# Patient Record
Sex: Female | Born: 1985 | Race: White | Hispanic: No | Marital: Married | Smoking: Current every day smoker
Health system: Southern US, Community
[De-identification: ages and names within clinical notes are randomized; demographics above are authoritative.]

## PROBLEM LIST (undated history)

## (undated) DIAGNOSIS — T783XXA Angioneurotic edema, initial encounter: Secondary | ICD-10-CM

## (undated) DIAGNOSIS — N926 Irregular menstruation, unspecified: Secondary | ICD-10-CM

## (undated) DIAGNOSIS — N898 Other specified noninflammatory disorders of vagina: Secondary | ICD-10-CM

## (undated) DIAGNOSIS — N644 Mastodynia: Secondary | ICD-10-CM

## (undated) DIAGNOSIS — Z7689 Persons encountering health services in other specified circumstances: Secondary | ICD-10-CM

## (undated) DIAGNOSIS — R569 Unspecified convulsions: Secondary | ICD-10-CM

## (undated) DIAGNOSIS — N390 Urinary tract infection, site not specified: Secondary | ICD-10-CM

## (undated) DIAGNOSIS — Z87898 Personal history of other specified conditions: Secondary | ICD-10-CM

## (undated) DIAGNOSIS — L509 Urticaria, unspecified: Secondary | ICD-10-CM

## (undated) DIAGNOSIS — Z113 Encounter for screening for infections with a predominantly sexual mode of transmission: Secondary | ICD-10-CM

## (undated) DIAGNOSIS — N912 Amenorrhea, unspecified: Secondary | ICD-10-CM

## (undated) DIAGNOSIS — N921 Excessive and frequent menstruation with irregular cycle: Secondary | ICD-10-CM

## (undated) HISTORY — PX: APPENDECTOMY: SHX54

## (undated) HISTORY — DX: Urticaria, unspecified: L50.9

## (undated) HISTORY — DX: Angioneurotic edema, initial encounter: T78.3XXA

## (undated) HISTORY — DX: Other specified noninflammatory disorders of vagina: N89.8

## (undated) HISTORY — DX: Mastodynia: N64.4

## (undated) HISTORY — PX: CHOLECYSTECTOMY: SHX55

## (undated) HISTORY — DX: Unspecified convulsions: R56.9

## (undated) HISTORY — PX: TUBAL LIGATION: SHX77

## (undated) HISTORY — DX: Encounter for screening for infections with a predominantly sexual mode of transmission: Z11.3

## (undated) HISTORY — DX: Persons encountering health services in other specified circumstances: Z76.89

## (undated) HISTORY — DX: Urinary tract infection, site not specified: N39.0

## (undated) HISTORY — DX: Amenorrhea, unspecified: N91.2

## (undated) HISTORY — DX: Irregular menstruation, unspecified: N92.6

## (undated) HISTORY — DX: Excessive and frequent menstruation with irregular cycle: N92.1

---
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
## 2007-04-14 ENCOUNTER — Emergency Department (HOSPITAL_COMMUNITY): Admission: EM | Admit: 2007-04-14 | Discharge: 2007-04-14 | Payer: Self-pay | Admitting: Emergency Medicine

## 2007-07-17 ENCOUNTER — Emergency Department (HOSPITAL_COMMUNITY): Admission: EM | Admit: 2007-07-17 | Discharge: 2007-07-17 | Payer: Self-pay | Admitting: Emergency Medicine

## 2007-11-28 ENCOUNTER — Emergency Department (HOSPITAL_COMMUNITY): Admission: EM | Admit: 2007-11-28 | Discharge: 2007-11-28 | Payer: Self-pay | Admitting: Emergency Medicine

## 2007-12-10 ENCOUNTER — Ambulatory Visit (HOSPITAL_COMMUNITY): Admission: RE | Admit: 2007-12-10 | Discharge: 2007-12-10 | Payer: Self-pay | Admitting: Obstetrics and Gynecology

## 2008-01-28 ENCOUNTER — Inpatient Hospital Stay (HOSPITAL_COMMUNITY): Admission: AD | Admit: 2008-01-28 | Discharge: 2008-01-29 | Payer: Self-pay | Admitting: Obstetrics and Gynecology

## 2008-01-29 ENCOUNTER — Inpatient Hospital Stay (HOSPITAL_COMMUNITY): Admission: AD | Admit: 2008-01-29 | Discharge: 2008-01-29 | Payer: Self-pay | Admitting: Obstetrics & Gynecology

## 2008-01-29 ENCOUNTER — Ambulatory Visit: Payer: Self-pay | Admitting: Advanced Practice Midwife

## 2008-01-31 ENCOUNTER — Observation Stay (HOSPITAL_COMMUNITY): Admission: AD | Admit: 2008-01-31 | Discharge: 2008-02-01 | Payer: Self-pay | Admitting: Obstetrics & Gynecology

## 2008-01-31 ENCOUNTER — Ambulatory Visit: Payer: Self-pay | Admitting: *Deleted

## 2008-02-04 ENCOUNTER — Inpatient Hospital Stay (HOSPITAL_COMMUNITY): Admission: AD | Admit: 2008-02-04 | Discharge: 2008-02-04 | Payer: Self-pay | Admitting: Gynecology

## 2008-02-19 ENCOUNTER — Inpatient Hospital Stay (HOSPITAL_COMMUNITY): Admission: AD | Admit: 2008-02-19 | Discharge: 2008-02-19 | Payer: Self-pay | Admitting: Family Medicine

## 2008-02-19 ENCOUNTER — Ambulatory Visit: Payer: Self-pay | Admitting: Family Medicine

## 2008-03-04 ENCOUNTER — Inpatient Hospital Stay (HOSPITAL_COMMUNITY): Admission: AD | Admit: 2008-03-04 | Discharge: 2008-03-04 | Payer: Self-pay | Admitting: Gynecology

## 2008-03-08 ENCOUNTER — Ambulatory Visit: Payer: Self-pay | Admitting: Obstetrics and Gynecology

## 2008-03-08 ENCOUNTER — Inpatient Hospital Stay (HOSPITAL_COMMUNITY): Admission: AD | Admit: 2008-03-08 | Discharge: 2008-03-10 | Payer: Self-pay | Admitting: Family Medicine

## 2009-12-31 ENCOUNTER — Emergency Department (HOSPITAL_COMMUNITY): Admission: EM | Admit: 2009-12-31 | Discharge: 2010-01-01 | Payer: Self-pay | Admitting: Emergency Medicine

## 2010-02-06 ENCOUNTER — Other Ambulatory Visit: Admission: RE | Admit: 2010-02-06 | Discharge: 2010-02-06 | Payer: Self-pay | Admitting: Obstetrics and Gynecology

## 2010-06-09 ENCOUNTER — Inpatient Hospital Stay (HOSPITAL_COMMUNITY)
Admission: AD | Admit: 2010-06-09 | Discharge: 2010-06-09 | Payer: Self-pay | Source: Home / Self Care | Admitting: Obstetrics & Gynecology

## 2010-06-12 ENCOUNTER — Inpatient Hospital Stay (HOSPITAL_COMMUNITY)
Admission: AD | Admit: 2010-06-12 | Discharge: 2010-06-12 | Payer: Self-pay | Source: Home / Self Care | Admitting: Obstetrics and Gynecology

## 2010-06-29 ENCOUNTER — Inpatient Hospital Stay (HOSPITAL_COMMUNITY)
Admission: AD | Admit: 2010-06-29 | Discharge: 2010-06-29 | Payer: Self-pay | Source: Home / Self Care | Admitting: Obstetrics and Gynecology

## 2010-07-01 ENCOUNTER — Inpatient Hospital Stay (HOSPITAL_COMMUNITY)
Admission: AD | Admit: 2010-07-01 | Discharge: 2010-07-02 | Payer: Self-pay | Source: Home / Self Care | Attending: Obstetrics & Gynecology | Admitting: Obstetrics & Gynecology

## 2010-08-04 ENCOUNTER — Inpatient Hospital Stay (HOSPITAL_COMMUNITY)
Admission: AD | Admit: 2010-08-04 | Discharge: 2010-08-04 | Payer: Self-pay | Source: Home / Self Care | Attending: Obstetrics & Gynecology | Admitting: Obstetrics & Gynecology

## 2010-08-07 LAB — URINE MICROSCOPIC-ADD ON

## 2010-08-07 LAB — URINALYSIS, ROUTINE W REFLEX MICROSCOPIC
Bilirubin Urine: NEGATIVE
Hgb urine dipstick: NEGATIVE
Ketones, ur: NEGATIVE mg/dL
Nitrite: NEGATIVE
Protein, ur: NEGATIVE mg/dL
Specific Gravity, Urine: 1.01 (ref 1.005–1.030)
Urine Glucose, Fasting: NEGATIVE mg/dL
Urobilinogen, UA: 0.2 mg/dL (ref 0.0–1.0)
pH: 6 (ref 5.0–8.0)

## 2010-08-11 ENCOUNTER — Inpatient Hospital Stay (HOSPITAL_COMMUNITY)
Admission: AD | Admit: 2010-08-11 | Discharge: 2010-08-15 | Payer: Self-pay | Source: Home / Self Care | Attending: Obstetrics & Gynecology | Admitting: Obstetrics & Gynecology

## 2010-08-14 LAB — WET PREP, GENITAL
Clue Cells Wet Prep HPF POC: NONE SEEN
Trich, Wet Prep: NONE SEEN
Yeast Wet Prep HPF POC: NONE SEEN

## 2010-08-14 LAB — CBC
HCT: 32.7 % — ABNORMAL LOW (ref 36.0–46.0)
Hemoglobin: 10.6 g/dL — ABNORMAL LOW (ref 12.0–15.0)
MCH: 27.9 pg (ref 26.0–34.0)
MCHC: 32.4 g/dL (ref 30.0–36.0)
MCV: 86.1 fL (ref 78.0–100.0)
Platelets: 246 10*3/uL (ref 150–400)
RBC: 3.8 MIL/uL — ABNORMAL LOW (ref 3.87–5.11)
RDW: 14.4 % (ref 11.5–15.5)
WBC: 16.2 10*3/uL — ABNORMAL HIGH (ref 4.0–10.5)

## 2010-08-14 LAB — URINE MICROSCOPIC-ADD ON

## 2010-08-14 LAB — URINALYSIS, ROUTINE W REFLEX MICROSCOPIC
Bilirubin Urine: NEGATIVE
Hgb urine dipstick: NEGATIVE
Ketones, ur: NEGATIVE mg/dL
Nitrite: NEGATIVE
Protein, ur: NEGATIVE mg/dL
Specific Gravity, Urine: 1.01 (ref 1.005–1.030)
Urine Glucose, Fasting: NEGATIVE mg/dL
Urobilinogen, UA: 0.2 mg/dL (ref 0.0–1.0)
pH: 6.5 (ref 5.0–8.0)

## 2010-08-15 LAB — RPR: RPR Ser Ql: NONREACTIVE

## 2010-08-15 LAB — GC/CHLAMYDIA PROBE AMP, GENITAL
Chlamydia, DNA Probe: NEGATIVE
GC Probe Amp, Genital: NEGATIVE

## 2010-08-15 NOTE — Op Note (Addendum)
  NAME:  Jaime Flores, Jaime Flores NO.:  [id number]  MEDICAL RECORD NO.:  [id number]          PATIENT TYPE:  INP  LOCATION:  9107                          FACILITY:  [facility]  PHYSICIAN:  Scheryl Darter, MD       DATE OF BIRTH:  04/05/1986  DATE OF PROCEDURE:  08/14/2010 DATE OF DISCHARGE:                              OPERATIVE REPORT   PREOPERATIVE DIAGNOSIS:  A 25 year old gravida 2, now para 2 with undesired fertility.  POSTOPERATIVE DIAGNOSIS:  A 25 year old gravida 2, now para 2 with undesired fertility.  PROCEDURE:  Postpartum tubal ligation with Filshie clips.  SURGEON:  Scheryl Darter, MD  ASSISTANT:  Lucina Mellow, DO  ANESTHESIA:  Spinal.  COMPLICATIONS:  None.  ESTIMATED BLOOD LOSS:  Less than 20 mL.  INDICATION:  This is a 25 year old female gravida 2, para 1-1-0-2, status post NSVD on August 13, 2010, who desired permanent sterilization.  Risks and benefits of the procedure were discussed with the patient including risk of failure less than 1 in 1000 with increased risk of ectopic gestation if pregnancy were to occur.  FINDINGS:  Include normal tubes and fimbria.  DESCRIPTION OF PROCEDURE:  The patient was taken to the operating room where a spinal anesthesia was found to be adequate.  A 10 mL of Marcaine was injected along the infraumbilical curvature.  The scar line from previous operation was followed with an infraumbilical skin incision made with scalpel.  The incision was carried down through the underlying fascia to the peritoneum with Mayo scissors and the peritoneum was entered.  The peritoneum was noted to be free of any adhesions and the incision was extended with Metzenbaum scissors.  The patient's left fallopian tube was identified, brought to the incision and grasped with Babcock clamps.  The tube was followed out to the fimbria.  The Babcock clamp was then used to grasp the tube approximately 4 cm from the cornual region.  Filshie clip  was then placed with good visualization of the Filshie clip on both sides of the tube.  No bleeding was noted and tube was returned back into the abdomen.  The right fallopian tube was then identified in the same fashion and again a Filshie clip visualized on both sides of the tube.  No bleeding again was noted.  The tube was returned to the abdomen.  The peritoneum and fascia were closed in single layer using 3-0 Vicryl.  The skin was closed in a subcuticular fashion using 3-0 Vicryl.  The patient tolerated the procedure well.  Sponge, lap, and needle counts were correct x2.  The patient was taken to the recovery room in stable condition.  There was no specimens for Pathology.    ______________________________ Lucina Mellow, DO   ______________________________ Scheryl Darter, MD    SH/MEDQ  D:  08/14/2010  T:  08/14/2010  Job:  [id number]  Electronically Signed by Lucina Mellow MD on 08/15/2010 01:53:28 PM Electronically Signed by Scheryl Darter MD on 08/19/2010 11:04:34 AM

## 2010-09-19 ENCOUNTER — Emergency Department (HOSPITAL_COMMUNITY)
Admission: EM | Admit: 2010-09-19 | Discharge: 2010-09-19 | Disposition: A | Discharge status: Home / Self Care | Payer: Medicaid Other | Attending: Emergency Medicine | Admitting: Emergency Medicine

## 2010-09-19 DIAGNOSIS — R109 Unspecified abdominal pain: Secondary | ICD-10-CM | POA: Insufficient documentation

## 2010-09-19 DIAGNOSIS — K8020 Calculus of gallbladder without cholecystitis without obstruction: Secondary | ICD-10-CM | POA: Insufficient documentation

## 2010-09-19 LAB — DIFFERENTIAL
Basophils Absolute: 0 10*3/uL (ref 0.0–0.1)
Basophils Relative: 0 % (ref 0–1)
Eosinophils Absolute: 0.2 10*3/uL (ref 0.0–0.7)
Eosinophils Relative: 2 % (ref 0–5)
Lymphocytes Relative: 20 % (ref 12–46)
Lymphs Abs: 1.8 10*3/uL (ref 0.7–4.0)
Monocytes Absolute: 0.3 10*3/uL (ref 0.1–1.0)
Monocytes Relative: 3 % (ref 3–12)
Neutro Abs: 6.6 10*3/uL (ref 1.7–7.7)
Neutrophils Relative %: 75 % (ref 43–77)

## 2010-09-19 LAB — COMPREHENSIVE METABOLIC PANEL
ALT: 37 U/L — ABNORMAL HIGH (ref 0–35)
AST: 43 U/L — ABNORMAL HIGH (ref 0–37)
Albumin: 3.7 g/dL (ref 3.5–5.2)
Alkaline Phosphatase: 91 U/L (ref 39–117)
BUN: 5 mg/dL — ABNORMAL LOW (ref 6–23)
CO2: 30 mEq/L (ref 19–32)
Calcium: 9 mg/dL (ref 8.4–10.5)
Chloride: 106 mEq/L (ref 96–112)
Creatinine, Ser: 0.75 mg/dL (ref 0.4–1.2)
GFR calc Af Amer: >60 mL/min (ref >60)
GFR calc non Af Amer: >60 mL/min (ref >60)
Glucose, Bld: 106 mg/dL — ABNORMAL HIGH (ref 70–99)
Potassium: 3.8 mEq/L (ref 3.5–5.1)
Sodium: 143 mEq/L (ref 135–145)
Total Bilirubin: 0.6 mg/dL (ref 0.3–1.2)
Total Protein: 6.4 g/dL (ref 6.0–8.3)

## 2010-09-19 LAB — CBC
HCT: 38.1 % (ref 36.0–46.0)
Hemoglobin: 11.8 g/dL — ABNORMAL LOW (ref 12.0–15.0)
MCH: 27.1 pg (ref 26.0–34.0)
MCHC: 31 g/dL (ref 30.0–36.0)
MCV: 87.6 fL (ref 78.0–100.0)
Platelets: 272 10*3/uL (ref 150–400)
RBC: 4.35 MIL/uL (ref 3.87–5.11)
RDW: 15.3 % (ref 11.5–15.5)
WBC: 8.7 10*3/uL (ref 4.0–10.5)

## 2010-09-19 LAB — URINALYSIS, ROUTINE W REFLEX MICROSCOPIC
Protein, ur: NEGATIVE mg/dL
Specific Gravity, Urine: 1.02 (ref 1.005–1.030)
Urine Glucose, Fasting: NEGATIVE mg/dL
Urobilinogen, UA: 1 mg/dL (ref 0.0–1.0)

## 2010-09-19 LAB — URINE MICROSCOPIC-ADD ON

## 2010-09-20 ENCOUNTER — Ambulatory Visit (HOSPITAL_COMMUNITY)
Admit: 2010-09-20 | Discharge: 2010-09-20 | Disposition: A | Discharge status: Home / Self Care | Payer: Medicaid Other | Source: Ambulatory Visit | Attending: Emergency Medicine | Admitting: Emergency Medicine

## 2010-09-20 DIAGNOSIS — R932 Abnormal findings on diagnostic imaging of liver and biliary tract: Secondary | ICD-10-CM | POA: Insufficient documentation

## 2010-09-20 DIAGNOSIS — R1013 Epigastric pain: Secondary | ICD-10-CM | POA: Insufficient documentation

## 2010-09-20 DIAGNOSIS — K8020 Calculus of gallbladder without cholecystitis without obstruction: Secondary | ICD-10-CM | POA: Insufficient documentation

## 2010-09-21 ENCOUNTER — Other Ambulatory Visit: Payer: Self-pay | Admitting: General Surgery

## 2010-09-21 ENCOUNTER — Inpatient Hospital Stay (HOSPITAL_COMMUNITY)
Admission: RE | Admit: 2010-09-21 | Discharge: 2010-09-22 | DRG: 419 | Disposition: A | Discharge status: Home / Self Care | Payer: Medicaid Other | Source: Ambulatory Visit | Attending: General Surgery | Admitting: General Surgery

## 2010-09-21 DIAGNOSIS — K8000 Calculus of gallbladder with acute cholecystitis without obstruction: Principal | ICD-10-CM | POA: Diagnosis present

## 2010-09-21 LAB — DIFFERENTIAL
Basophils Relative: 0 % (ref 0–1)
Eosinophils Absolute: 0.1 10*3/uL (ref 0.0–0.7)
Monocytes Absolute: 0.2 10*3/uL (ref 0.1–1.0)
Monocytes Relative: 3 % (ref 3–12)

## 2010-09-21 LAB — CBC
MCH: 27.1 pg (ref 26.0–34.0)
MCHC: 31.4 g/dL (ref 30.0–36.0)
Platelets: 221 10*3/uL (ref 150–400)
RDW: 15.1 % (ref 11.5–15.5)

## 2010-09-21 LAB — COMPREHENSIVE METABOLIC PANEL
AST: 56 U/L — ABNORMAL HIGH (ref 0–37)
Albumin: 3.4 g/dL — ABNORMAL LOW (ref 3.5–5.2)
Calcium: 9 mg/dL (ref 8.4–10.5)
Creatinine, Ser: 0.68 mg/dL (ref 0.4–1.2)
GFR calc Af Amer: >60 mL/min (ref >60)
GFR calc non Af Amer: >60 mL/min (ref >60)
Total Protein: 6 g/dL (ref 6.0–8.3)

## 2010-09-21 LAB — MRSA PCR SCREENING: MRSA by PCR: NEGATIVE

## 2010-09-29 NOTE — Op Note (Signed)
[name], Jaime Flores             ACCOUNT NO.:  [id number]  MEDICAL RECORD NO.:  [id number]           PATIENT TYPE:  I  LOCATION:  A303                          FACILITY:  [facility]  PHYSICIAN:  Tilford Pillar, MD      DATE OF BIRTH:  1986/03/12  DATE OF PROCEDURE:  09/21/2010 DATE OF DISCHARGE:                              OPERATIVE REPORT   PREOPERATIVE DIAGNOSIS:  Acute cholecystitis.  POSTOPERATIVE DIAGNOSIS:  Acute cholecystitis.  PROCEDURE:  Laparoscopic cholecystectomy.  SURGEON:  Tilford Pillar, MD  ANESTHESIA:  General endotracheal, local anesthetic 0.5% Sensorcaine plain.  SPECIMEN:  Gallbladder.  ESTIMATED BLOOD LOSS:  Minimal.  INDICATIONS:  The patient is a 25 year old female who presented to [facility] with worsening right upper quadrant abdominal pain.  She had intermittent episodes over the last several weeks and workup was consistent for cholelithiasis and acute cholecystitis.  The risks, benefits, alternatives of laparoscopic possible open cholecystectomy were discussed at length with the patient including but not limited to risk of bleeding, infection, bile leak, small bowel injury, common bile duct injury as well as possibility of entrapped cardiac and pulmonary events were discussed at length with the patient.  Questions and concerns were addressed.  The patient was consented for planned procedure.  OPERATION:  The patient was taken to the operating room, placed in supine position on the operating table at which time the general anesthetic was administered.  Once the patient was asleep, she endotracheally intubated by Anesthesia.  At this time, her abdomen was prepped DuraPrep solution and draped in standard fashion.  A stab incision was created supraumbilically with #11 blade scalpel. Additional dissection down to the subcuticular tissues carried out using a Kocher clamp, which was utilized to grasp the anterior abdominal wall fascia  anteriorly.  A Veress needle was inserted.  Saline drop test was utilized to confirm intraperitoneal placement and then pneumoperitoneum was initiated.  Once sufficient pneumoperitoneum was obtained, a 11-mm trocar was inserted over laparoscope allowing visualization of the trocar entering into the peritoneal cavity.  At this time the inner cannula was removed.  Laparoscope was reinserted.  There was no evidence of any trocar or Veress needle placement injury.  At this time, the remaining trocars were placed with 11-mm trocar in the epigastrium, 5-mm trocar in the midline between two 11-mm trocar, 5-mm trocar in the right lateral abdominal wall.  The patient was placed in reversed Trendelenburg left lateral decubitus position.  Fundus of the gallbladder was grasped and lifted up and over the right lobe of the liver.  At this time blunt [place] dissection was utilized to dissect the peritoneal reflection off the infundibulum exposing the cystic duct __________ infundibulum.  A window was created behind the cystic duct as well as on the cystic artery.  Three EndoClips were placed proximally on the cystic duct, one distally and cystic duct was divided between two most distal clips.  Similarly the cystic artery was clipped with 2 EndoClips proximally, one distally, and the cystic artery was divided between two most distal clips.  At this time, electrocautery was utilized to dissect the gallbladder free from the gallbladder  fossa. Once it was freed, it was placed into an EndoCatch bag and placed up and over the right lobe of the liver.  At this time, hemostasis was excellent within the gallbladder fossa.  Inspection of the EndoClips demonstrated excellent hemostasis and no evidence of any bile leak.  At this time, I turned my attention to closure.  Using an Endoclose suture passing device, a 2-0 Vicryl suture was passed through both the 11-mm trocars. With these sutures in place,  the gallbladder was retrieved and removed through umbilical trocar site in an intact EndoCatch bag.  Minimal amount of blunt dilatation was required to enlarge the umbilical trocar site adequately to remove the gallbladder.  __________ back table and sent as a permanent specimen to Pathology.  At this time, the pneumoperitoneum was evacuated.  The Vicryl sutures were secured.  The local anesthetic was instilled.  A 4-0 Monocryl was utilized to reapproximate skin edges at all trocar sites. The skin was washed and dried with moist dry towel.  Benzoin was applied around the incision.  Half-inch Steri-Strips were placed.  Drapes were removed.  The patient was allowed to come out of general anesthetic. The patient was transferred back to postanesthetic care unit in stable condition.  At the conclusion of procedure, all instrument, sponge, and needle counts were correct.  The patient tolerated the procedure extremely well.     Tilford Pillar, MD     BZ/MEDQ  D:  09/21/2010  T:  09/22/2010  Job:  [id number]  Electronically Signed by Tilford Pillar MD on 09/28/2010 09:32:00 PM

## 2010-10-03 LAB — COMPREHENSIVE METABOLIC PANEL
ALT: 10 U/L (ref 0–35)
ALT: 14 U/L (ref 0–35)
AST: 24 U/L (ref 0–37)
Albumin: 2.5 g/dL — ABNORMAL LOW (ref 3.5–5.2)
Alkaline Phosphatase: 129 U/L — ABNORMAL HIGH (ref 39–117)
CO2: 27 mEq/L (ref 19–32)
Chloride: 105 mEq/L (ref 96–112)
GFR calc Af Amer: >60 mL/min (ref >60)
GFR calc non Af Amer: >60 mL/min (ref >60)
Glucose, Bld: 84 mg/dL (ref 70–99)
Potassium: 3.5 mEq/L (ref 3.5–5.1)
Potassium: 3.6 mEq/L (ref 3.5–5.1)
Sodium: 136 mEq/L (ref 135–145)
Sodium: 138 mEq/L (ref 135–145)
Total Bilirubin: 0.3 mg/dL (ref 0.3–1.2)
Total Protein: 5.3 g/dL — ABNORMAL LOW (ref 6.0–8.3)

## 2010-10-03 LAB — URINALYSIS, ROUTINE W REFLEX MICROSCOPIC
Bilirubin Urine: NEGATIVE
Bilirubin Urine: NEGATIVE
Glucose, UA: NEGATIVE mg/dL
Hgb urine dipstick: NEGATIVE
Hgb urine dipstick: NEGATIVE
Ketones, ur: NEGATIVE mg/dL
Protein, ur: 30 mg/dL — AB
Specific Gravity, Urine: 1.01 (ref 1.005–1.030)
pH: 7 (ref 5.0–8.0)

## 2010-10-03 LAB — CBC
HCT: 29.4 % — ABNORMAL LOW (ref 36.0–46.0)
Hemoglobin: 10.4 g/dL — ABNORMAL LOW (ref 12.0–15.0)
Hemoglobin: 9.9 g/dL — ABNORMAL LOW (ref 12.0–15.0)
RBC: 3.41 MIL/uL — ABNORMAL LOW (ref 3.87–5.11)
RDW: 13.3 % (ref 11.5–15.5)
WBC: 17 10*3/uL — ABNORMAL HIGH (ref 4.0–10.5)
WBC: 18.1 10*3/uL — ABNORMAL HIGH (ref 4.0–10.5)

## 2010-10-03 LAB — URINE MICROSCOPIC-ADD ON

## 2010-10-03 LAB — GC/CHLAMYDIA PROBE AMP, GENITAL
Chlamydia, DNA Probe: NEGATIVE
GC Probe Amp, Genital: NEGATIVE

## 2010-10-03 LAB — URINE CULTURE
Colony Count: NO GROWTH
Culture  Setup Time: 201112081047

## 2010-10-03 LAB — WET PREP, GENITAL
Trich, Wet Prep: NONE SEEN
Yeast Wet Prep HPF POC: NONE SEEN

## 2010-10-03 LAB — STREP B DNA PROBE: Strep Group B Ag: NEGATIVE

## 2010-10-03 LAB — FETAL FIBRONECTIN: Fetal Fibronectin: NEGATIVE

## 2010-10-03 LAB — RAPID URINE DRUG SCREEN, HOSP PERFORMED: Tetrahydrocannabinol: NOT DETECTED

## 2010-10-09 LAB — URINALYSIS, ROUTINE W REFLEX MICROSCOPIC
Ketones, ur: NEGATIVE mg/dL
Nitrite: NEGATIVE
Specific Gravity, Urine: 1.01 (ref 1.005–1.030)
Urobilinogen, UA: 0.2 mg/dL (ref 0.0–1.0)
pH: 7 (ref 5.0–8.0)

## 2010-10-09 LAB — PREGNANCY, URINE: Preg Test, Ur: POSITIVE

## 2010-11-06 NOTE — H&P (Signed)
Jaime Flores, Jaime Flores             ACCOUNT NO.:  [id number]  MEDICAL RECORD NO.:  [id number]           PATIENT TYPE:  I  LOCATION:  A303                          FACILITY:  [facility]  PHYSICIAN:  Tilford Pillar, MD      DATE OF BIRTH:  Jul 12, 1986  DATE OF ADMISSION:  09/21/2010 DATE OF DISCHARGE:  03/02/2012LH                             HISTORY & PHYSICAL   CHIEF COMPLAINT:  Right upper quadrant abdominal pain.  HISTORY OF PRESENT ILLNESS:  The patient is a 25 year old female who presented to [facility] with increasing right upper quadrant/epigastric abdominal pain.  Pain started after eating ice cream.  Has been constant and persistent, is colicky in nature.  She has had similar symptomatology in the past, but never severe.  It was a severe pain, increased with eating.  She has had no nausea, no emesis. No change in bowel movements.  No melena, no hematochezia.  She has had no history of jaundice.  PAST MEDICAL HISTORY:  History of anxiety, history of gallstones, history of panic attacks, and migraines.  PAST SURGICAL HISTORY:  Appendectomy and tubal ligation.  MEDICATIONS:  Percocet and Phenergan.  ALLERGIES:  No known drug allergies.  SOCIAL HISTORY:  Positive tobacco smoking.  No alcohol.  No recreational drug abuse.  REVIEW OF SYSTEMS:  CONSTITUTIONAL:  Unremarkable.  EYES:  Unremarkable. EARS, NOSE, and THROAT:  Unremarkable.  RESPIRATORY:  Unremarkable. CARDIOVASCULAR:  Unremarkable.  GASTROINTESTINAL:  As per HPI. GENITOURINARY:  Unremarkable.  MUSCULOSKELETAL:  Unremarkable.  SKIN and NEURO:  Unremarkable.  FAMILY HISTORY:  Noncontributory.  PHYSICAL EXAM:  VITAL SIGNS:  Temperature 97.2, heart rate 60, respiratory rate 20, blood pressure 120/67.  She is 98% O2 saturation on room air. GENERAL:  The patient is on any acute distress.  She is alert and oriented x3. HEENT:  Scalp, no deformities or masses.  Eyes:  Pupils equal, round, reactive to light.   Extraocular movements are intact.  No scleral icterus or conjunctival pallor is noted.  Oral mucosa is pink.  Normal occlusion. NECK:  Trachea is midline.  No cervical lymphadenopathy. PULMONARY:  Unlabored respiration.  She is clear to auscultation. CARDIOVASCULAR:  Regular rate and rhythm.  No murmurs or gallops, or rubs.  She has 2+ radial and femoral pulses bilaterally. ABDOMEN:  Positive bowel sounds.  Abdomen is soft.  She does have right upper quadrant abdominal pain.  She has a positive Murphy sign.  No diffuse peritoneal signs.  No hernias.  No masses. EXTREMITIES:  Warm and dry.  PERTINENT LABORATORY AND RADIOGRAPHIC STUDIES:  CBC:  White blood cell count 6.3, hemoglobin 11.2, hematocrit 35.7, platelets 221, neutrophils at 60.  Basic metabolic panel is within normal limits; sodium 140, potassium 3.8, chloride 106, bicarb 26, BUN 6, creatinine 0.68, blood glucose 93.  AST, ALT are within normal limits.  Amylase and lipase are within normal limits.  Right upper quadrant ultrasound demonstrates positive cholelithiasis and biliary tree dilatation.  ASSESSMENT/PLAN:  Acute cholecystitis, cholelithiasis.  At this time, the patient will be admitted for continued conservative management, IV fluids, bowel rest and discussion is presented to the patient.  Risks, benefits, alternatives of proceeding to the operating room for a laparoscopic, possible open cholecystectomy.  Her questions and concerns were addressed and she will be admitted for the planned intervention.     Tilford Pillar, MD     BZ/MEDQ  D:  10/31/2010  T:  11/01/2010  Job:  [id number]  Electronically Signed by Tilford Pillar MD on 11/06/2010 11:23:34 AM

## 2010-11-06 NOTE — Discharge Summary (Signed)
  Jaime Flores, Jaime Flores             ACCOUNT NO.:  [id number]  MEDICAL RECORD NO.:  [id number]           PATIENT TYPE:  I  LOCATION:  A303                          FACILITY:  [facility]  PHYSICIAN:  Tilford Pillar, MD      DATE OF BIRTH:  [birth year]  DATE OF ADMISSION:  09/21/2010 DATE OF DISCHARGE:  03/02/2012LH                              DISCHARGE SUMMARY   ADMISSION DIAGNOSIS:  Acute cholecystitis.  POSTOPERATIVE DIAGNOSES: 1. Acute cholecystitis. 2. Anxiety. 3. History of panic attacks.  PROCEDURES:  Laparoscopic cholecystectomy.  DISPOSITION:  Home.  BRIEF HISTORY AND PHYSICAL:  Please see admission history and physical for complete H and P.  The patient is 25 year old female who presented with right upper quadrant abdominal pain.  Previous workup was consistent with cholelithiasis and symptomatology was consistent for biliary etiology.  She was admitted for continued management and intervention.  HOSPITAL COURSE:  The patient was admitted on July 25, 2010.  She was taken to the operating room on the same day.  She tolerated the laparoscopic cholecystectomy.  Spent a brief period in Postanesthetic Care Unit and was watched overnight.  She was advanced on diet.  On September 22, 2010, the patient was tolerating regular diet.  Her pain was controlled on oral analgesia and she was ambulatory.  Plans were made for discharge to home.  DISCHARGE INSTRUCTIONS:  The patient was instructed increase activity slowly as tolerated.  She is not to lift anything greater than 20 pounds for the next 4 weeks.  She may shower, though she is not to wet the incision for the next 2-3 weeks.  She is return to see me in the office in 3 weeks.  She is call should she have any questions or concerns.  DISCHARGE MEDICATIONS:  Please see the discharge medication reconciliation sheet for all discharge medications.     Tilford Pillar, MD     BZ/MEDQ  D:  10/31/2010  T:  11/01/2010  Job:   [id number]  Electronically Signed by Tilford Pillar MD on 11/06/2010 11:23:32 AM

## 2010-11-14 ENCOUNTER — Emergency Department (HOSPITAL_COMMUNITY)
Admission: EM | Admit: 2010-11-14 | Discharge: 2010-11-14 | Disposition: A | Discharge status: Home / Self Care | Payer: Medicaid Other | Attending: Emergency Medicine | Admitting: Emergency Medicine

## 2010-11-14 ENCOUNTER — Emergency Department (HOSPITAL_COMMUNITY): Payer: Medicaid Other

## 2010-11-14 DIAGNOSIS — K219 Gastro-esophageal reflux disease without esophagitis: Secondary | ICD-10-CM | POA: Insufficient documentation

## 2010-11-14 DIAGNOSIS — F411 Generalized anxiety disorder: Secondary | ICD-10-CM | POA: Insufficient documentation

## 2010-11-14 DIAGNOSIS — R1013 Epigastric pain: Secondary | ICD-10-CM | POA: Insufficient documentation

## 2010-11-14 DIAGNOSIS — G43909 Migraine, unspecified, not intractable, without status migrainosus: Secondary | ICD-10-CM | POA: Insufficient documentation

## 2010-11-14 LAB — COMPREHENSIVE METABOLIC PANEL
ALT: 38 U/L — ABNORMAL HIGH (ref 0–35)
AST: 29 U/L (ref 0–37)
Albumin: 4 g/dL (ref 3.5–5.2)
Calcium: 9.1 mg/dL (ref 8.4–10.5)
Creatinine, Ser: 0.61 mg/dL (ref 0.4–1.2)
GFR calc Af Amer: >60 mL/min (ref >60)
GFR calc non Af Amer: >60 mL/min (ref >60)
Sodium: 138 mEq/L (ref 135–145)
Total Protein: 6.6 g/dL (ref 6.0–8.3)

## 2010-11-14 LAB — URINALYSIS, ROUTINE W REFLEX MICROSCOPIC
Hgb urine dipstick: NEGATIVE
Nitrite: NEGATIVE
Protein, ur: NEGATIVE mg/dL
Specific Gravity, Urine: 1.025 (ref 1.005–1.030)
Urobilinogen, UA: 1 mg/dL (ref 0.0–1.0)

## 2010-11-14 LAB — DIFFERENTIAL
Basophils Absolute: 0.1 10*3/uL (ref 0.0–0.1)
Basophils Relative: 1 % (ref 0–1)
Eosinophils Relative: 3 % (ref 0–5)
Lymphocytes Relative: 32 % (ref 12–46)
Monocytes Absolute: 0.3 10*3/uL (ref 0.1–1.0)
Neutro Abs: 3.8 10*3/uL (ref 1.7–7.7)

## 2010-11-14 LAB — CBC
HCT: 36 % (ref 36.0–46.0)
MCHC: 31.7 g/dL (ref 30.0–36.0)
RDW: 16.1 % — ABNORMAL HIGH (ref 11.5–15.5)
WBC: 6.4 10*3/uL (ref 4.0–10.5)

## 2010-12-03 ENCOUNTER — Emergency Department (HOSPITAL_COMMUNITY)
Admission: EM | Admit: 2010-12-03 | Discharge: 2010-12-04 | Disposition: A | Discharge status: Home / Self Care | Payer: Medicaid Other | Attending: Emergency Medicine | Admitting: Emergency Medicine

## 2010-12-03 DIAGNOSIS — F17200 Nicotine dependence, unspecified, uncomplicated: Secondary | ICD-10-CM | POA: Insufficient documentation

## 2010-12-03 DIAGNOSIS — L02419 Cutaneous abscess of limb, unspecified: Secondary | ICD-10-CM | POA: Insufficient documentation

## 2010-12-08 NOTE — H&P (Signed)
NAME:  Jaime Flores, Jaime Flores             ACCOUNT NO.:  [id number]   MEDICAL RECORD NO.:  [id number]          PATIENT TYPE:  AMB   LOCATION:  DAY                           FACILITY:  [facility]   PHYSICIAN:  Lazaro Arms, M.D.   DATE OF BIRTH:  28-Jul-1985   DATE OF ADMISSION:  DATE OF DISCHARGE:  LH                                HISTORY & PHYSICAL   HISTORY OF PRESENT ILLNESS:  Chenise is a 25 year old white female, gravida  0, para 0, who states she is virginal, who has been seen in our office on  numerous occasions over the past year and a half with recurrent persistent  pelvic and abdominal pain.  She has had several ultrasounds, CT scans,  ordered not by just [place], but by primary care physicians as well.  She has  been seen in the ER.  The patient has been treated with magnesium citrate  for constipation, with Bentyl for irritable bowel, and she is really quite  fixated on this being a GYN source of her pain.  She has been told on  several occasions that the ultrasounds were essentially normal, and that  there was no evidence of this being a GYN problem.  She has  specifically  been talked to about with Dr. Phillips Odor regarding irritable bowel on several  occasions.  The patient and her mother and family have become increasingly  persistent regarding further evaluation.  They are almost demanding in the  past to have laparoscopic oophorectomy.  Both Dr. Emelda Fear and I have seen  her in the past and have said this is absolutely not an option.  She has not  seen a gastroenterologist, despite her efforts to schedule her for that.  She has been placed on Ov-Con and again definitively, we have told her we do  not think this is related to a GYN issue.  However, the patient and her  mother are quite persistent and I have told them I could not be 100% sure  without doing a laparoscopy.  As a result she is admitted for laparoscopy  for evaluation.   PAST MEDICAL HISTORY:  She has malrotation of the right  kidney.   PAST SURGICAL HISTORY:  Negative.   PAST OB HISTORY:  Negative.   MEDICATIONS:  Currently none.  She has been not compliant with her Ov-Con  and her GI medications.   REVIEW OF SYSTEMS:  Otherwise negative.   PHYSICAL EXAMINATION:  VITAL SIGNS:  She weighs 121 pounds.  Blood pressure  100/60.  HEENT:  Unremarkable.  NECK:  Thyroid is normal.  LUNGS:  Clear.  HEART:  Regular rate and rhythm.  No regurge or gallop.  BREASTS:  Deferred.  ABDOMEN:  Benign.  PELVIC:  Deferred.  EXTREMITIES:  No edema.  NEUROLOGIC:  Grossly intact.   IMPRESSION:  Chronic pelvic pain, have instructed the patient and her family  I doubt this is of a GYN source.   PLAN:  The patient has been persistent in her continued evaluation for  ongoing pelvic abdominal pain.  She has not followed up with the  gastroenterologist at  our request.  Will do a diagnostic laparoscopy for  full GYN evaluation.  If that is negative, she will have to either see a  gastroenterologist or chronic pelvic pain clinic.  She understands risks,  benefits, indications, and alternatives and will proceed.      Lazaro Arms, M.D.  Electronically Signed     LHE/MEDQ  D:  07/17/2005  T:  07/17/2005  Job:  [id number]

## 2010-12-08 NOTE — Op Note (Signed)
Jaime Flores, Jaime Flores             ACCOUNT NO.:  [id number]   MEDICAL RECORD NO.:  [id number]          PATIENT TYPE:  AMB   LOCATION:  DAY                           FACILITY:  [facility]   PHYSICIAN:  Lazaro Arms, M.D.   DATE OF BIRTH:  November 06, 1985   DATE OF PROCEDURE:  07/18/2005  DATE OF DISCHARGE:                                 OPERATIVE REPORT   PREOPERATIVE DIAGNOSIS:  Chronic pelvic pain.   POSTOPERATIVE DIAGNOSIS:  Chronic pelvic pain.   PROCEDURE:  1.  Diagnostic laparoscopy.  2.  Laparoscopic appendectomy performed by Dr. Lovell Sheehan.   SURGEON:  Dr. Despina Hidden.   FINDINGS:  The patient had a normal pelvis. She had no endometriosis. No  adhesions. She did have an anteverted uterus. Both ovaries were normal.  Fallopian tubes were normal. All peritoneal surfaces were normal, and the  appendix was normal. The upper abdomen was also normal.   DESCRIPTION OF PROCEDURE:  The patient was taken to the operating room and  placed in a supine position and underwent general endotracheal anesthesia,  placed in dorsal lithotomy position, and prepped and draped in usual sterile  fashion. Incision was made in the umbilicus. A Veress needle was placed. The  abdomen was insufflated. A nonbladed trocar was used and a direct  visualization technique employed, and the peritoneal cavity was entered with  one pass without difficulty. Incision was made two fingerbreadths above the  pubis, and a 12-mm trocar was placed. An additional 5-mm trocar was placed  in the left lower quadrant. The above noted findings were seen and found to  be completely normal. There was no adhesive disease. No evidence of past  infection. No endometriosis and no anatomical abnormalities. Her appendix  was also normal. Dr. Lovell Sheehan did a laparoscopic appendectomy. Please see his  note regarding that. When that was completed, there was no bleeding. The  stump was intact. The upper abdomen was viewed and found to be normal. All  of  this was recorded. The instruments were removed. The fascia in the  umbilical and suprapubic incisions were closed, and all skin incisions were  closed with staples. The patient tolerated the procedure well. She  experienced minimal blood loss and was taken to recovery in good and stable  condition. All counts were correct.      Lazaro Arms, M.D.  Electronically Signed     LHE/MEDQ  D:  07/18/2005  T:  07/18/2005  Job:  [id number]

## 2010-12-08 NOTE — Op Note (Signed)
NAMEMARILYNNE, Flores             ACCOUNT NO.:  [id number]   MEDICAL RECORD NO.:  [id number]          PATIENT TYPE:  AMB   LOCATION:  DAY                           FACILITY:  [facility]   PHYSICIAN:  Dalia Heading, M.D.  DATE OF BIRTH:  10-31-1985   DATE OF PROCEDURE:  07/18/2005  DATE OF DISCHARGE:                                 OPERATIVE REPORT   PREOPERATIVE DIAGNOSIS:  Pelvic pain.   POSTOPERATIVE DIAGNOSIS:  Pelvic pain.   PROCEDURE:  Laparoscopic appendectomy.   SURGEON:  Dr. Franky Macho.   ASSISTANT:  Dr. Duane Lope.   INDICATIONS:  The patient is a 25 year old white female who has been  followed by Dr. Despina Hidden for chronic pelvic pain. She is about to undergo a  diagnostic laparoscopy, and Dr. Despina Hidden requested that I come in and do an  incidental appendectomy given her recurrent pelvic pain. This was explained  to the parents, who gave informed consent to Dr. Despina Hidden.   PROCEDURE NOTE:  The patient was under general endotracheal anesthesia. The  abdomen had already been prepped and draped. Trocars were placed in the in  the infraumbilical region, suprapubic region, left lower quadrant. Dr. Despina Hidden  had performed his diagnostic laparoscopy. The appendix was visualized and  noted to be within normal limits. The base of the cecum was within normal  limits. The terminal ileum was within normal limits. The mesoappendix was  divided using the harmonic scalpel. A vascular Endo-GIA was placed across  the base the appendix and fired. The appendix was removed using an EndoCatch  bag. The staple line was inspected, and no evidence of bleeding was noted.   I went out and talked to the mother and explained that the appendix was  normal but has been removed anyway. She understood.   COMPLICATIONS:  None.   SPECIMEN:  Appendix.   BLOOD LOSS:  None.      Dalia Heading, M.D.  Electronically Signed     MAJ/MEDQ  D:  07/18/2005  T:  07/18/2005  Job:  [id number]

## 2010-12-12 ENCOUNTER — Emergency Department (HOSPITAL_COMMUNITY)
Admission: EM | Admit: 2010-12-12 | Discharge: 2010-12-12 | Disposition: A | Discharge status: Home / Self Care | Payer: Medicaid Other | Attending: Emergency Medicine | Admitting: Emergency Medicine

## 2010-12-12 DIAGNOSIS — R110 Nausea: Secondary | ICD-10-CM | POA: Insufficient documentation

## 2010-12-12 DIAGNOSIS — R109 Unspecified abdominal pain: Secondary | ICD-10-CM | POA: Insufficient documentation

## 2010-12-12 LAB — URINALYSIS, ROUTINE W REFLEX MICROSCOPIC
Glucose, UA: NEGATIVE mg/dL
Hgb urine dipstick: NEGATIVE
Ketones, ur: NEGATIVE mg/dL
Nitrite: NEGATIVE
Specific Gravity, Urine: >1.03 — ABNORMAL HIGH (ref 1.005–1.030)
Urobilinogen, UA: 0.2 mg/dL (ref 0.0–1.0)
pH: 6 (ref 5.0–8.0)

## 2010-12-12 LAB — DIFFERENTIAL
Basophils Absolute: 0 10*3/uL (ref 0.0–0.1)
Basophils Relative: 0 % (ref 0–1)
Eosinophils Absolute: 0.1 10*3/uL (ref 0.0–0.7)
Eosinophils Relative: 1 % (ref 0–5)
Lymphocytes Relative: 16 % (ref 12–46)
Lymphs Abs: 0.9 10*3/uL (ref 0.7–4.0)
Monocytes Absolute: 0.3 10*3/uL (ref 0.1–1.0)
Monocytes Relative: 5 % (ref 3–12)
Neutro Abs: 4.2 10*3/uL (ref 1.7–7.7)
Neutrophils Relative %: 78 % — ABNORMAL HIGH (ref 43–77)

## 2010-12-12 LAB — COMPREHENSIVE METABOLIC PANEL WITH GFR
ALT: 13 U/L (ref 0–35)
AST: 13 U/L (ref 0–37)
Albumin: 3.5 g/dL (ref 3.5–5.2)
Alkaline Phosphatase: 79 U/L (ref 39–117)
BUN: 5 mg/dL — ABNORMAL LOW (ref 6–23)
CO2: 29 meq/L (ref 19–32)
Calcium: 9 mg/dL (ref 8.4–10.5)
Chloride: 104 meq/L (ref 96–112)
Creatinine, Ser: 0.52 mg/dL (ref 0.4–1.2)
GFR calc non Af Amer: >60 mL/min
Glucose, Bld: 80 mg/dL (ref 70–99)
Potassium: 3.3 meq/L — ABNORMAL LOW (ref 3.5–5.1)
Sodium: 139 meq/L (ref 135–145)
Total Bilirubin: 0.4 mg/dL (ref 0.3–1.2)
Total Protein: 5.9 g/dL — ABNORMAL LOW (ref 6.0–8.3)

## 2010-12-12 LAB — CBC
HCT: 36.1 % (ref 36.0–46.0)
Hemoglobin: 11.4 g/dL — ABNORMAL LOW (ref 12.0–15.0)
MCH: 27.2 pg (ref 26.0–34.0)
MCHC: 31.6 g/dL (ref 30.0–36.0)
MCV: 86.2 fL (ref 78.0–100.0)
Platelets: 208 10*3/uL (ref 150–400)
RBC: 4.19 MIL/uL (ref 3.87–5.11)
RDW: 15.9 % — ABNORMAL HIGH (ref 11.5–15.5)
WBC: 5.4 10*3/uL (ref 4.0–10.5)

## 2010-12-12 LAB — URINE MICROSCOPIC-ADD ON

## 2011-01-26 ENCOUNTER — Emergency Department (HOSPITAL_COMMUNITY)
Admission: EM | Admit: 2011-01-26 | Discharge: 2011-01-26 | Disposition: A | Discharge status: Home / Self Care | Payer: Medicaid Other | Attending: Emergency Medicine | Admitting: Emergency Medicine

## 2011-01-26 DIAGNOSIS — N72 Inflammatory disease of cervix uteri: Secondary | ICD-10-CM | POA: Insufficient documentation

## 2011-01-26 DIAGNOSIS — N898 Other specified noninflammatory disorders of vagina: Secondary | ICD-10-CM | POA: Insufficient documentation

## 2011-01-26 LAB — WET PREP, GENITAL: Clue Cells Wet Prep HPF POC: NONE SEEN

## 2011-01-26 LAB — URINALYSIS, ROUTINE W REFLEX MICROSCOPIC
Bilirubin Urine: NEGATIVE
Ketones, ur: NEGATIVE mg/dL
Nitrite: NEGATIVE
Protein, ur: NEGATIVE mg/dL
Urobilinogen, UA: 0.2 mg/dL (ref 0.0–1.0)

## 2011-01-26 LAB — URINE MICROSCOPIC-ADD ON

## 2011-01-26 LAB — POCT PREGNANCY, URINE: Preg Test, Ur: NEGATIVE

## 2011-01-29 LAB — GC/CHLAMYDIA PROBE AMP, GENITAL
Chlamydia, DNA Probe: NEGATIVE
GC Probe Amp, Genital: NEGATIVE

## 2011-01-30 ENCOUNTER — Emergency Department (HOSPITAL_COMMUNITY): Payer: Medicaid Other

## 2011-01-30 ENCOUNTER — Encounter: Payer: Self-pay | Admitting: *Deleted

## 2011-01-30 ENCOUNTER — Emergency Department (HOSPITAL_COMMUNITY)
Admission: EM | Admit: 2011-01-30 | Discharge: 2011-01-30 | Disposition: A | Discharge status: Home / Self Care | Payer: Medicaid Other | Attending: Emergency Medicine | Admitting: Emergency Medicine

## 2011-01-30 DIAGNOSIS — IMO0001 Reserved for inherently not codable concepts without codable children: Secondary | ICD-10-CM

## 2011-01-30 DIAGNOSIS — R0602 Shortness of breath: Secondary | ICD-10-CM | POA: Insufficient documentation

## 2011-01-30 LAB — PREGNANCY, URINE: Preg Test, Ur: NEGATIVE

## 2011-01-30 MED ORDER — ALBUTEROL SULFATE HFA 108 (90 BASE) MCG/ACT IN AERS: 1.0000 | INHALATION_SPRAY | Freq: Four times a day (QID) | RESPIRATORY_TRACT | Status: DC | PRN

## 2011-01-30 MED ORDER — ALBUTEROL SULFATE (2.5 MG/3ML) 0.083% IN NEBU
INHALATION_SOLUTION | RESPIRATORY_TRACT | Status: AC
  Administered 2011-01-30: 5 mg via RESPIRATORY_TRACT
  Filled 2011-01-30: qty 6

## 2011-01-30 MED ORDER — SODIUM CHLORIDE 0.9 % IJ SOLN
INTRAMUSCULAR | Status: AC
  Administered 2011-01-30: 10 mL via INTRAVENOUS
  Filled 2011-01-30: qty 20

## 2011-01-30 MED ORDER — SODIUM CHLORIDE 0.9 % IN NEBU
INHALATION_SOLUTION | RESPIRATORY_TRACT | Status: AC
  Administered 2011-01-30: 3 mL
  Filled 2011-01-30: qty 3

## 2011-01-30 MED ORDER — DIPHENHYDRAMINE HCL 25 MG PO CAPS
50.0000 mg | ORAL_CAPSULE | Freq: Once | ORAL | Status: AC
Start: 2011-01-30 — End: 2011-01-30
  Administered 2011-01-30: 50 mg via ORAL
  Filled 2011-01-30: qty 2

## 2011-01-30 MED ORDER — DEXAMETHASONE SODIUM PHOSPHATE 10 MG/ML IJ SOLN: 10.0000 mg | Freq: Once | INTRAMUSCULAR | Status: DC

## 2011-01-30 MED ORDER — DEXAMETHASONE SODIUM PHOSPHATE 4 MG/ML IJ SOLN
10.0000 mg | Freq: Once | INTRAMUSCULAR | Status: AC
  Administered 2011-01-30: 10 mg via INTRAVENOUS

## 2011-01-30 MED ORDER — DEXAMETHASONE SODIUM PHOSPHATE 10 MG/ML IJ SOLN
10.0000 mg | Freq: Once | INTRAMUSCULAR | Status: AC
  Administered 2011-01-30: 10 mg via INTRAVENOUS
  Filled 2011-01-30: qty 1

## 2011-01-30 MED ORDER — ALBUTEROL SULFATE (5 MG/ML) 0.5% IN NEBU
5.0000 mg | INHALATION_SOLUTION | Freq: Once | RESPIRATORY_TRACT | Status: AC
  Administered 2011-01-30: 5 mg via RESPIRATORY_TRACT

## 2011-01-30 NOTE — ED Provider Notes (Signed)
History     Chief Complaint  Patient presents with  . Shortness of Breath   HPI Comments: She reports being placed on hydrocodone and doxycycline today by her gynecologist for an unknown vaginal infection today. She took her first dose of both medicines at 3 pm.  At 5:30 she reports feeling nauseated, so she ate a piece of pizza,  Nausea improved.  About 6 pm she developed shortness of breath,  And mid sternal chest pressure along with tingling of her finger tips.  She believes this to allergic reaction to doxycycline.  She has taken hydrocodone before with no ill effect.  Patient is a 25 y.o. female presenting with shortness of breath. The history is provided by the patient.  Shortness of Breath  The current episode started today. The onset was sudden. The problem occurs continuously. The problem has been unchanged. The problem is moderate. The symptoms are relieved by nothing. The symptoms are aggravated by nothing. Associated symptoms include chest pressure and shortness of breath. Pertinent negatives include no chest pain, no orthopnea, no fever, no rhinorrhea, no sore throat, no stridor, no cough and no wheezing. She was not exposed to toxic fumes. She has had no prior steroid use. Her past medical history does not include asthma or past wheezing. Recently, medical care has been given by the PCP. Services received include medications given.    History reviewed. No pertinent past medical history.  History reviewed. No pertinent past surgical history.  No family history on file.  History  Substance Use Topics  . Smoking status: Not on file  . Smokeless tobacco: Not on file  . Alcohol Use: No    OB History    Grav Para Term Preterm Abortions TAB SAB Ect Mult Living                  Review of Systems  Constitutional: Negative for fever and fatigue.  HENT: Negative for sore throat, facial swelling, rhinorrhea, sneezing, trouble swallowing, voice change and postnasal drip.     Respiratory: Positive for chest tightness and shortness of breath. Negative for cough, choking, wheezing and stridor.   Cardiovascular: Negative for chest pain and orthopnea.  Genitourinary: Negative.   Neurological: Negative.   Hematological: Negative.   Psychiatric/Behavioral: Negative.     Physical Exam  BP 109/54  Pulse 62  Temp(Src) 98.6 F (37 C) (Oral)  Resp 18  SpO2 100%  LMP 01/30/2011  Physical Exam  Constitutional: She appears well-developed and well-nourished.  HENT:  Head: Normocephalic.  Eyes: Pupils are equal, round, and reactive to light.  Neck: Normal range of motion. Neck supple.  Cardiovascular: Normal rate and regular rhythm.   Pulmonary/Chest: Effort normal and breath sounds normal. Not tachypneic. No respiratory distress. She has no wheezes. She has no rales.    ED Course  Procedures  MDM Patient given dexamethasone 10 mg,  Benadryl,  Observed in dept with no evidence for allergic reaction/anaphylaxis.  Dispo to home in no acute distress.      Candis Musa, PA 02/03/11 2117

## 2011-01-30 NOTE — ED Notes (Signed)
Pt reports staring doxycycline today and then feeling "like I can't breathe", pt also reports chest wall pain asso with her sob

## 2011-01-30 NOTE — ED Notes (Signed)
Pt has been in/out of sinus brady since arrival, Pt is asymptomatic and heart rate will increase to 60's when awake and talking on cell phone.

## 2011-02-06 NOTE — ED Provider Notes (Signed)
Medical screening examination/treatment/procedure(s) were performed by non-physician practitioner and as supervising physician I was immediately available for consultation/collaboration.  Joya Gaskins, MD 02/06/11 [phone]

## 2011-04-19 LAB — URINE CULTURE
Colony Count: NO GROWTH
Culture: NO GROWTH

## 2011-04-19 LAB — RPR: RPR Ser Ql: NONREACTIVE

## 2011-04-19 LAB — WET PREP, GENITAL
Clue Cells Wet Prep HPF POC: NONE SEEN
Trich, Wet Prep: NONE SEEN
Trich, Wet Prep: NONE SEEN
Yeast Wet Prep HPF POC: NONE SEEN

## 2011-04-19 LAB — URINE MICROSCOPIC-ADD ON

## 2011-04-19 LAB — CBC
Hemoglobin: 11.4 — ABNORMAL LOW
RBC: 3.9
WBC: 16 — ABNORMAL HIGH

## 2011-04-19 LAB — URINALYSIS, ROUTINE W REFLEX MICROSCOPIC
Bilirubin Urine: NEGATIVE
Glucose, UA: NEGATIVE
Ketones, ur: NEGATIVE
Nitrite: NEGATIVE
Nitrite: NEGATIVE
Protein, ur: NEGATIVE
Protein, ur: NEGATIVE
Specific Gravity, Urine: 1.015
Urobilinogen, UA: 0.2

## 2011-04-19 LAB — GC/CHLAMYDIA PROBE AMP, GENITAL
Chlamydia, DNA Probe: NEGATIVE
GC Probe Amp, Genital: NEGATIVE

## 2011-04-27 LAB — PREGNANCY, URINE: Preg Test, Ur: POSITIVE

## 2011-04-27 LAB — URINALYSIS, ROUTINE W REFLEX MICROSCOPIC
Bilirubin Urine: NEGATIVE
Nitrite: NEGATIVE
Specific Gravity, Urine: <1.005 — ABNORMAL LOW
Urobilinogen, UA: 0.2
pH: 6

## 2012-07-10 ENCOUNTER — Emergency Department (HOSPITAL_COMMUNITY): Payer: No Typology Code available for payment source

## 2012-07-10 ENCOUNTER — Emergency Department (HOSPITAL_COMMUNITY)
Admission: EM | Admit: 2012-07-10 | Discharge: 2012-07-10 | Disposition: A | Discharge status: Home / Self Care | Payer: No Typology Code available for payment source | Attending: Emergency Medicine | Admitting: Emergency Medicine

## 2012-07-10 ENCOUNTER — Encounter (HOSPITAL_COMMUNITY): Payer: Self-pay | Admitting: Emergency Medicine

## 2012-07-10 DIAGNOSIS — IMO0002 Reserved for concepts with insufficient information to code with codable children: Secondary | ICD-10-CM | POA: Insufficient documentation

## 2012-07-10 DIAGNOSIS — R110 Nausea: Secondary | ICD-10-CM | POA: Insufficient documentation

## 2012-07-10 DIAGNOSIS — Y92410 Unspecified street and highway as the place of occurrence of the external cause: Secondary | ICD-10-CM | POA: Insufficient documentation

## 2012-07-10 DIAGNOSIS — Z79899 Other long term (current) drug therapy: Secondary | ICD-10-CM | POA: Insufficient documentation

## 2012-07-10 DIAGNOSIS — Y9389 Activity, other specified: Secondary | ICD-10-CM | POA: Insufficient documentation

## 2012-07-10 DIAGNOSIS — R51 Headache: Secondary | ICD-10-CM | POA: Insufficient documentation

## 2012-07-10 DIAGNOSIS — F17200 Nicotine dependence, unspecified, uncomplicated: Secondary | ICD-10-CM | POA: Insufficient documentation

## 2012-07-10 DIAGNOSIS — M549 Dorsalgia, unspecified: Secondary | ICD-10-CM

## 2012-07-10 MED ORDER — NAPROXEN 500 MG PO TABS: 500.0000 mg | ORAL_TABLET | Freq: Two times a day (BID) | ORAL | Status: DC

## 2012-07-10 MED ORDER — CYCLOBENZAPRINE HCL 10 MG PO TABS: 10.0000 mg | ORAL_TABLET | Freq: Two times a day (BID) | ORAL | Status: DC | PRN

## 2012-07-10 MED ORDER — IBUPROFEN 800 MG PO TABS
800.0000 mg | ORAL_TABLET | Freq: Once | ORAL | Status: AC
  Administered 2012-07-10: 800 mg via ORAL
  Filled 2012-07-10: qty 1
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
## 2012-08-12 ENCOUNTER — Encounter (HOSPITAL_COMMUNITY): Payer: Self-pay | Admitting: *Deleted

## 2012-08-12 ENCOUNTER — Emergency Department (HOSPITAL_COMMUNITY)
Admission: EM | Admit: 2012-08-12 | Discharge: 2012-08-12 | Disposition: A | Discharge status: Home / Self Care | Payer: Medicaid Other | Attending: Emergency Medicine | Admitting: Emergency Medicine

## 2012-08-12 DIAGNOSIS — B349 Viral infection, unspecified: Secondary | ICD-10-CM

## 2012-08-12 DIAGNOSIS — IMO0001 Reserved for inherently not codable concepts without codable children: Secondary | ICD-10-CM | POA: Insufficient documentation

## 2012-08-12 DIAGNOSIS — R51 Headache: Secondary | ICD-10-CM | POA: Insufficient documentation

## 2012-08-12 DIAGNOSIS — R059 Cough, unspecified: Secondary | ICD-10-CM | POA: Insufficient documentation

## 2012-08-12 DIAGNOSIS — J029 Acute pharyngitis, unspecified: Secondary | ICD-10-CM | POA: Insufficient documentation

## 2012-08-12 DIAGNOSIS — R05 Cough: Secondary | ICD-10-CM | POA: Insufficient documentation

## 2012-08-12 DIAGNOSIS — F17200 Nicotine dependence, unspecified, uncomplicated: Secondary | ICD-10-CM | POA: Insufficient documentation

## 2012-08-12 DIAGNOSIS — J3489 Other specified disorders of nose and nasal sinuses: Secondary | ICD-10-CM | POA: Insufficient documentation

## 2012-08-12 DIAGNOSIS — B9789 Other viral agents as the cause of diseases classified elsewhere: Secondary | ICD-10-CM | POA: Insufficient documentation

## 2012-08-12 MED ORDER — IBUPROFEN 400 MG PO TABS: 600.0000 mg | ORAL_TABLET | Freq: Once | ORAL | Status: DC

## 2012-08-12 MED ORDER — ACETAMINOPHEN 500 MG PO TABS
ORAL_TABLET | ORAL | Status: AC
  Administered 2012-08-12: 1000 mg
  Filled 2012-08-12: qty 2

## 2012-08-12 NOTE — ED Provider Notes (Signed)
Medical screening examination/treatment/procedure(s) were performed by non-physician practitioner and as supervising physician I was immediately available for consultation/collaboration.   Glynn Octave, MD 08/12/12 2236

## 2012-08-12 NOTE — ED Provider Notes (Signed)
History     CSN: [id number]  Arrival date & time 08/12/12  2142   First MD Initiated Contact with Patient 08/12/12 2153      Chief Complaint  Patient presents with  . Fever    (Consider location/radiation/quality/duration/timing/severity/associated sxs/prior treatment) HPI Comments: Patient with sore throat, cough, myalgia, fever Was seen by PCP and tested negative for strep given Z pack for URI   Patient is a 27 y.o. female presenting with fever. The history is provided by the patient.  Fever Primary symptoms of the febrile illness include fever, headaches, cough and myalgias. Primary symptoms do not include wheezing, nausea, vomiting, diarrhea, dysuria or rash.  The fever began yesterday. The fever has been unchanged since its onset. The maximum temperature recorded prior to her arrival was unknown.  The headache began 2 days ago. Headache is a new problem. The pain from the headache is at a severity of 3/10. The headache is not associated with weakness.  The cough began 2 days ago. The cough is non-productive.  Myalgias began 2 days ago. The myalgias are not associated with weakness, tenderness or swelling.    History reviewed. No pertinent past medical history.  Past Surgical History  Procedure Date  . Cholecystectomy   . Appendectomy   . Tubal ligation     Family History  Problem Relation Age of Onset  . Stroke Other   . Diabetes Other   . Heart failure Other     History  Substance Use Topics  . Smoking status: Current Every Day Smoker -- 0.5 packs/day for 5 years    Types: Cigarettes  . Smokeless tobacco: Never Used  . Alcohol Use: No    OB History    Grav Para Term Preterm Abortions TAB SAB Ect Mult Living   2 2 1 1      2       Review of Systems  Constitutional: Positive for fever.  HENT: Positive for congestion and sore throat. Negative for rhinorrhea and trouble swallowing.   Respiratory: Positive for cough. Negative for wheezing.   Cardiovascular:  Negative for chest pain.  Gastrointestinal: Negative for nausea, vomiting and diarrhea.  Genitourinary: Negative for dysuria.  Musculoskeletal: Positive for myalgias.  Skin: Negative for rash and wound.  Neurological: Positive for headaches. Negative for dizziness and weakness.    Allergies  Doxycycline  Home Medications   Current Outpatient Rx  Name  Route  Sig  Dispense  Refill  . CYCLOBENZAPRINE HCL 10 MG PO TABS   Oral   Take 1 tablet (10 mg total) by mouth 2 (two) times daily as needed for muscle spasms.   15 tablet   0   . NAPROXEN 500 MG PO TABS   Oral   Take 1 tablet (500 mg total) by mouth 2 (two) times daily.   15 tablet   0     BP 109/58  Pulse 90  Temp 101.3 F (38.5 C) (Oral)  Resp 18  Ht 5' 10.5" (1.791 m)  Wt 119 lb (53.978 kg)  BMI 16.83 kg/m2  SpO2 100%  LMP 07/20/2012  Physical Exam  Constitutional: She is oriented to person, place, and time. She appears well-developed and well-nourished.  HENT:  Head: Normocephalic.  Right Ear: External ear normal.  Left Ear: External ear normal.  Mouth/Throat: Oropharynx is clear and moist. No oropharyngeal exudate.  Eyes: Pupils are equal, round, and reactive to light.  Neck: Normal range of motion.  Cardiovascular: Normal rate.   Pulmonary/Chest: Effort  normal. She has no wheezes. She exhibits no tenderness.  Abdominal: Soft.  Musculoskeletal: Normal range of motion. She exhibits no edema and no tenderness.  Neurological: She is alert and oriented to person, place, and time.  Skin: Skin is warm.    ED Course  Procedures (including critical care time)  Labs Reviewed - No data to display No results found.   1. Viral syndrome       MDM  Viral syndrome most likely influenza         Arman Filter, NP 08/12/12 2210

## 2012-08-12 NOTE — ED Notes (Signed)
Verified with nurse practioner about the number of days given off from work and cna classes. She stated it was appropriated for patient with the flu. No further orders given

## 2012-08-12 NOTE — ED Notes (Addendum)
Fever, cough, taking a z pack No NVD.  Had neg strep screen.  Seen by MD yesterday

## 2012-10-16 ENCOUNTER — Telehealth: Payer: Self-pay | Admitting: Nurse Practitioner

## 2012-10-16 NOTE — Telephone Encounter (Signed)
Already in the chart

## 2012-10-16 NOTE — Telephone Encounter (Signed)
Patient was calling to notify us that she has recently found that she is allergic to Doxicyclin and would like that to be noted in her chart.

## 2012-12-12 ENCOUNTER — Other Ambulatory Visit: Payer: Self-pay | Admitting: Adult Health

## 2013-01-14 ENCOUNTER — Ambulatory Visit (INDEPENDENT_AMBULATORY_CARE_PROVIDER_SITE_OTHER): Payer: Medicaid Other | Admitting: Adult Health

## 2013-01-15 ENCOUNTER — Ambulatory Visit (INDEPENDENT_AMBULATORY_CARE_PROVIDER_SITE_OTHER): Payer: Medicaid Other | Admitting: General Practice

## 2013-01-15 ENCOUNTER — Encounter: Payer: Self-pay | Admitting: General Practice

## 2013-01-15 VITALS — BP 101/62 | HR 73 | Temp 98.2°F | Ht 62.75 in | Wt 123.5 lb

## 2013-01-15 DIAGNOSIS — N390 Urinary tract infection, site not specified: Secondary | ICD-10-CM

## 2013-01-15 DIAGNOSIS — N912 Amenorrhea, unspecified: Secondary | ICD-10-CM

## 2013-01-15 DIAGNOSIS — R109 Unspecified abdominal pain: Secondary | ICD-10-CM

## 2013-01-15 LAB — POCT URINALYSIS DIPSTICK
Ketones, UA: NEGATIVE
Protein, UA: NEGATIVE
Spec Grav, UA: 1.015

## 2013-01-15 LAB — POCT UA - MICROSCOPIC ONLY
Crystals, Ur, HPF, POC: NEGATIVE
Mucus, UA: NEGATIVE

## 2013-01-15 MED ORDER — CIPROFLOXACIN HCL 500 MG PO TABS: 500.0000 mg | ORAL_TABLET | Freq: Two times a day (BID) | ORAL | Status: DC

## 2013-01-15 NOTE — Patient Instructions (Addendum)
Urinary Tract Infection  Urinary tract infections (UTIs) can develop anywhere along your urinary tract. Your urinary tract is your body's drainage system for removing wastes and extra water. Your urinary tract includes two kidneys, two ureters, a bladder, and a urethra. Your kidneys are a pair of bean-shaped organs. Each kidney is about the size of your fist. They are located below your ribs, one on each side of your spine.  CAUSES  Infections are caused by microbes, which are microscopic organisms, including fungi, viruses, and bacteria. These organisms are so small that they can only be seen through a microscope. Bacteria are the microbes that most commonly cause UTIs.  SYMPTOMS   Symptoms of UTIs may vary by age and gender of the patient and by the location of the infection. Symptoms in young women typically include a frequent and intense urge to urinate and a painful, burning feeling in the bladder or urethra during urination. Older women and men are more likely to be tired, shaky, and weak and have muscle aches and abdominal pain. A fever may mean the infection is in your kidneys. Other symptoms of a kidney infection include pain in your back or sides below the ribs, nausea, and vomiting.  DIAGNOSIS  To diagnose a UTI, your caregiver will ask you about your symptoms. Your caregiver also will ask to provide a urine sample. The urine sample will be tested for bacteria and white blood cells. White blood cells are made by your body to help fight infection.  TREATMENT   Typically, UTIs can be treated with medication. Because most UTIs are caused by a bacterial infection, they usually can be treated with the use of antibiotics. The choice of antibiotic and length of treatment depend on your symptoms and the type of bacteria causing your infection.  HOME CARE INSTRUCTIONS   If you were prescribed antibiotics, take them exactly as your caregiver instructs you. Finish the medication even if you feel better after you  have only taken some of the medication.   Drink enough water and fluids to keep your urine clear or pale yellow.   Avoid caffeine, tea, and carbonated beverages. They tend to irritate your bladder.   Empty your bladder often. Avoid holding urine for long periods of time.   Empty your bladder before and after sexual intercourse.   After a bowel movement, women should cleanse from front to back. Use each tissue only once.  SEEK MEDICAL CARE IF:    You have back pain.   You develop a fever.   Your symptoms do not begin to resolve within 3 days.  SEEK IMMEDIATE MEDICAL CARE IF:    You have severe back pain or lower abdominal pain.   You develop chills.   You have nausea or vomiting.   You have continued burning or discomfort with urination.  MAKE SURE YOU:    Understand these instructions.   Will watch your condition.   Will get help right away if you are not doing well or get worse.  Document Released: 04/18/2005 Document Revised: 01/08/2012 Document Reviewed: 08/17/2011  ExitCare Patient Information 2014 [employer].

## 2013-01-15 NOTE — Progress Notes (Signed)
Subjective:    Patient ID: Jaime Flores, female    DOB: 1985/11/24, 27 y.o.   MRN: [id number]  Pelvic Pain The patient's primary symptoms include pelvic pain. This is a new problem. The current episode started in the past 7 days. The problem occurs 2 to 4 times per day. The problem has been gradually worsening. The problem affects both sides. She is not pregnant. Associated symptoms include dysuria. Pertinent negatives include no abdominal pain, chills, diarrhea, fever, flank pain, frequency, headaches, hematuria, nausea, painful intercourse, rash, urgency or vomiting. Exacerbated by: flat lying or side position. She has tried NSAIDs and acetaminophen for the symptoms. The treatment provided significant relief. She is sexually active. No, her partner does not have an STD. She uses tubal ligation (January 2012) for contraception. Her menstrual history has been regular. Her past medical history is significant for an abdominal surgery. There is no history of a Cesarean section, an ectopic pregnancy, PID or an STD. (Gallbladder and appendix removed)  Reports irregular menses this month. She denies known pregnancy.     Review of Systems  Constitutional: Negative for fever and chills.  Respiratory: Negative for chest tightness and shortness of breath.   Cardiovascular: Negative for chest pain and palpitations.  Gastrointestinal: Negative for nausea, vomiting, abdominal pain, diarrhea and blood in stool.  Genitourinary: Positive for dysuria and pelvic pain. Negative for urgency, frequency, hematuria, flank pain, difficulty urinating and vaginal pain.  Skin: Negative for rash.  Neurological: Negative for dizziness and headaches.       Objective:   Physical Exam  Constitutional: She is oriented to person, place, and time. She appears well-developed and well-nourished.  Cardiovascular: Normal rate, regular rhythm and normal heart sounds.   Pulmonary/Chest: Effort normal and breath sounds normal.  No respiratory distress. She exhibits no tenderness.  Abdominal: Soft. Bowel sounds are normal. She exhibits no distension. There is tenderness in the suprapubic area. There is no CVA tenderness.  Neurological: She is alert and oriented to person, place, and time.  Skin: Skin is warm and dry.  Psychiatric: She has a normal mood and affect.   Results for orders placed in visit on 01/15/13  POCT URINALYSIS DIPSTICK      Result Value Range   Color, UA yellow     Clarity, UA cloudy     Glucose, UA neg     Bilirubin, UA neg     Ketones, UA neg     Spec Grav, UA 1.015     Blood, UA trace     pH, UA 6.5     Protein, UA neg     Urobilinogen, UA negative     Nitrite, UA neg     Leukocytes, UA Trace    POCT UA - MICROSCOPIC ONLY      Result Value Range   WBC, Ur, HPF, POC 5-10     RBC, urine, microscopic neg     Bacteria, U Microscopic neg     Mucus, UA neg     Epithelial cells, urine per micros occ     Crystals, Ur, HPF, POC neg     Casts, Ur, LPF, POC neg     Yeast, UA rare    POCT URINE PREGNANCY      Result Value Range   Preg Test, Ur Negative           Assessment & Plan:  1. Abdominal pain, unspecified site - POCT urinalysis dipstick - POCT UA - Microscopic Only - POCT urine  pregnancy  2. Amenorrhea - POCT urine pregnancy  3. UTI (urinary tract infection) - ciprofloxacin (CIPRO) 500 MG tablet; Take 1 tablet (500 mg total) by mouth 2 (two) times daily.  Dispense: 20 tablet; Refill: 0 Increase fluid intake AZO over the counter X2 days Frequent voiding Proper perineal hygiene RTO in 2 weeks and sooner if symptoms worsen Patient verbalized understanding Coralie Keens, FNP-C

## 2013-01-27 ENCOUNTER — Ambulatory Visit (INDEPENDENT_AMBULATORY_CARE_PROVIDER_SITE_OTHER): Payer: Medicaid Other | Admitting: Adult Health

## 2013-01-27 ENCOUNTER — Encounter: Payer: Self-pay | Admitting: Adult Health

## 2013-01-27 VITALS — BP 112/68 | Ht 62.0 in | Wt 124.0 lb

## 2013-01-27 DIAGNOSIS — Z3202 Encounter for pregnancy test, result negative: Secondary | ICD-10-CM

## 2013-01-27 DIAGNOSIS — N949 Unspecified condition associated with female genital organs and menstrual cycle: Secondary | ICD-10-CM

## 2013-01-27 DIAGNOSIS — N390 Urinary tract infection, site not specified: Secondary | ICD-10-CM

## 2013-01-27 DIAGNOSIS — N912 Amenorrhea, unspecified: Secondary | ICD-10-CM

## 2013-01-27 HISTORY — DX: Amenorrhea, unspecified: N91.2

## 2013-01-27 LAB — POCT URINALYSIS DIPSTICK: Glucose, UA: NEGATIVE

## 2013-01-27 LAB — COMPREHENSIVE METABOLIC PANEL
BUN: 5 mg/dL — ABNORMAL LOW (ref 6–23)
CO2: 29 mEq/L (ref 19–32)
Calcium: 9.5 mg/dL (ref 8.4–10.5)
Chloride: 101 mEq/L (ref 96–112)
Creat: 0.66 mg/dL (ref 0.50–1.10)
Glucose, Bld: 78 mg/dL (ref 70–99)

## 2013-01-27 LAB — CBC
HCT: 41 % (ref 36.0–46.0)
MCV: 88 fL (ref 78.0–100.0)
RBC: 4.66 MIL/uL (ref 3.87–5.11)
WBC: 9.1 10*3/uL (ref 4.0–10.5)

## 2013-01-27 MED ORDER — MEDROXYPROGESTERONE ACETATE 10 MG PO TABS: 10.0000 mg | ORAL_TABLET | Freq: Every day | ORAL | Status: DC

## 2013-01-27 MED ORDER — NITROFURANTOIN MONOHYD MACRO 100 MG PO CAPS: 100.0000 mg | ORAL_CAPSULE | Freq: Two times a day (BID) | ORAL | Status: DC

## 2013-01-27 NOTE — Progress Notes (Signed)
Subjective:     Patient ID: Jaime Flores, female   DOB: 1985/12/21, 27 y.o.   MRN: [id number]  HPI Jaime Flores is a 28 yea old white female in complaining of pelvic pressure, hot flashes and no period since 11/15/12, she is s/p BTL.She was seen at [facility] and given cipro but only took part of it.She also complains of hot flashes.   Review of Systems Positives in HPI Reviewed past medical,surgical, social and family history. Reviewed medications and allergies.     Objective:   Physical Exam BP 112/68  Ht 5\' 2"  (1.575 m)  Wt 124 lb (56.246 kg)  BMI 22.67 kg/m2  LMP 04/26/2014Urine pregnancy test negative, urine dipstick +blood and leuks. Skin warm and dry.Pelvic: external genitalia is normal in appearance, vagina: white discharge without odor, cervix:everted at os and bulbous, uterus: normal size, shape and contour, non tender, no masses felt, adnexa: no masses or tenderness noted.She is tender over bladder.    Assessment:    Amenorrhea UTI-hematuria Pelvic pressure    Plan:      Rx Macrobid 1 bid x 7 days Rx provera 10 mg 1 daily x 10 days   UA C&S Check CBC,CMP,TSH and FSH Return in 2 weeks for follow

## 2013-01-27 NOTE — Patient Instructions (Addendum)
Take provera 10 mg 1 daily x 10 days  Take macrobid 1 bid x 7 days Return in 2 weeks

## 2013-01-28 LAB — URINALYSIS
Ketones, ur: NEGATIVE mg/dL
Nitrite: NEGATIVE
Urobilinogen, UA: 0.2 mg/dL (ref 0.0–1.0)
pH: 6 (ref 5.0–8.0)

## 2013-01-28 LAB — URINE CULTURE
Colony Count: NO GROWTH
Organism ID, Bacteria: NO GROWTH

## 2013-01-28 LAB — TSH: TSH: 2.061 u[IU]/mL (ref 0.350–4.500)

## 2013-02-10 ENCOUNTER — Ambulatory Visit (INDEPENDENT_AMBULATORY_CARE_PROVIDER_SITE_OTHER): Payer: Medicaid Other | Admitting: Adult Health

## 2013-02-10 ENCOUNTER — Encounter: Payer: Self-pay | Admitting: Adult Health

## 2013-02-10 VITALS — BP 118/72 | Ht 62.5 in | Wt 127.0 lb

## 2013-02-10 DIAGNOSIS — N912 Amenorrhea, unspecified: Secondary | ICD-10-CM

## 2013-02-10 DIAGNOSIS — N926 Irregular menstruation, unspecified: Secondary | ICD-10-CM

## 2013-02-10 NOTE — Progress Notes (Signed)
Subjective:     Patient ID: Jaime Flores, female   DOB: Mar 10, 1986, 27 y.o.   MRN: [id number]  HPI Jaime Flores is back in follow up of taking provera to start a period, and she started on 01/30/13 and had a 4 day period.she feels better.  Review of Systems See HPI Reviewed past medical,surgical, social and family history. Reviewed medications and allergies.     Objective:   Physical Exam BP 118/72  Ht 5' 2.5" (1.588 m)  Wt 127 lb (57.607 kg)  BMI 22.84 kg/m2  LMP 01/30/2013   talk only- she took provera and had a period and feels better.  Assessment:      Amenorrhea that responded to provera with period     Plan:      Keep period calendar and call with problems or questions

## 2013-02-10 NOTE — Assessment & Plan Note (Signed)
Had period after provera

## 2013-02-10 NOTE — Patient Instructions (Addendum)
Keep period calendar Call prn

## 2013-04-15 ENCOUNTER — Telehealth: Payer: Self-pay | Admitting: Obstetrics and Gynecology

## 2013-04-22 NOTE — Telephone Encounter (Signed)
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 Pt has already been to ED, Dr Jerre Simon [facility]) and has a second opinion appt at [facility] scheduled. Ct shows rt inguinal testis, outside of abd cavity. Pt counselled to be patient, pt advised that the online scare re: testicle cancer  Is an issue with intraabdominal testes, not inguinal. Pt encouraged to write concerns down, await appt at [facility] .

## 2013-04-24 ENCOUNTER — Telehealth: Payer: Self-pay | Admitting: Adult Health

## 2013-05-05 NOTE — Telephone Encounter (Signed)
Patient never called back.

## 2013-05-26 ENCOUNTER — Telehealth: Payer: Self-pay | Admitting: Nurse Practitioner

## 2013-05-26 ENCOUNTER — Ambulatory Visit (INDEPENDENT_AMBULATORY_CARE_PROVIDER_SITE_OTHER): Payer: Medicaid Other | Admitting: Nurse Practitioner

## 2013-05-26 ENCOUNTER — Encounter: Payer: Self-pay | Admitting: Nurse Practitioner

## 2013-05-26 ENCOUNTER — Encounter (HOSPITAL_COMMUNITY): Payer: Self-pay | Admitting: Emergency Medicine

## 2013-05-26 ENCOUNTER — Emergency Department (HOSPITAL_COMMUNITY): Payer: Medicaid Other

## 2013-05-26 ENCOUNTER — Emergency Department (HOSPITAL_COMMUNITY)
Admission: EM | Admit: 2013-05-26 | Discharge: 2013-05-26 | Disposition: A | Discharge status: Home / Self Care | Payer: Medicaid Other | Attending: Emergency Medicine | Admitting: Emergency Medicine

## 2013-05-26 VITALS — BP 105/62 | HR 76 | Temp 97.3°F | Ht 63.5 in | Wt 133.0 lb

## 2013-05-26 DIAGNOSIS — Z8742 Personal history of other diseases of the female genital tract: Secondary | ICD-10-CM | POA: Insufficient documentation

## 2013-05-26 DIAGNOSIS — J069 Acute upper respiratory infection, unspecified: Secondary | ICD-10-CM

## 2013-05-26 DIAGNOSIS — Z87440 Personal history of urinary (tract) infections: Secondary | ICD-10-CM | POA: Insufficient documentation

## 2013-05-26 DIAGNOSIS — R197 Diarrhea, unspecified: Secondary | ICD-10-CM | POA: Insufficient documentation

## 2013-05-26 DIAGNOSIS — R05 Cough: Secondary | ICD-10-CM | POA: Insufficient documentation

## 2013-05-26 DIAGNOSIS — R059 Cough, unspecified: Secondary | ICD-10-CM | POA: Insufficient documentation

## 2013-05-26 DIAGNOSIS — F17200 Nicotine dependence, unspecified, uncomplicated: Secondary | ICD-10-CM | POA: Insufficient documentation

## 2013-05-26 DIAGNOSIS — IMO0001 Reserved for inherently not codable concepts without codable children: Secondary | ICD-10-CM | POA: Insufficient documentation

## 2013-05-26 DIAGNOSIS — J019 Acute sinusitis, unspecified: Secondary | ICD-10-CM

## 2013-05-26 DIAGNOSIS — J329 Chronic sinusitis, unspecified: Secondary | ICD-10-CM

## 2013-05-26 MED ORDER — IBUPROFEN 800 MG PO TABS: 800.0000 mg | ORAL_TABLET | Freq: Three times a day (TID) | ORAL | Status: DC | PRN

## 2013-05-26 MED ORDER — AZITHROMYCIN 250 MG PO TABS: ORAL_TABLET | ORAL | Status: DC

## 2013-05-26 NOTE — Progress Notes (Signed)
  Subjective:    Patient ID: Jaime Flores, female    DOB: October 06, 1985, 27 y.o.   MRN: [id number]  URI  This is a new problem. The current episode started yesterday. The problem has been gradually worsening. Associated symptoms include congestion, coughing, ear pain, headaches, sinus pain and sneezing. Pertinent negatives include no nausea. She has tried sleep, decongestant and antihistamine for the symptoms. The treatment provided mild relief.      Review of Systems  HENT: Positive for congestion, ear pain and sneezing.   Respiratory: Positive for cough.   Gastrointestinal: Negative for nausea.  Neurological: Positive for headaches.  All other systems reviewed and are negative.       Objective:   Physical Exam  Vitals reviewed. Constitutional: She is oriented to person, place, and time. She appears well-developed and well-nourished.  HENT:  Head: Normocephalic.  Right Ear: External ear normal.  Left Ear: External ear normal.  Nose: Rhinorrhea present. Right sinus exhibits maxillary sinus tenderness. Left sinus exhibits maxillary sinus tenderness.  Mouth/Throat: Oropharynx is clear and moist.  Eyes: Pupils are equal, round, and reactive to light.  Cardiovascular: Normal rate, regular rhythm, normal heart sounds and intact distal pulses.   No murmur heard. Pulmonary/Chest: Effort normal and breath sounds normal. She has no wheezes.  Abdominal: Soft. Bowel sounds are normal. She exhibits no distension. There is no tenderness.  Neurological: She is alert and oriented to person, place, and time.  Skin: Skin is warm and dry.  Psychiatric: She has a normal mood and affect. Her behavior is normal. Judgment and thought content normal.   /BP 105/62  Pulse 76  Temp(Src) 97.3 F (36.3 C) (Oral)  Ht 5' 3.5" (1.613 m)  Wt 133 lb (60.328 kg)  BMI 23.19 kg/m2        Assessment & Plan:   1. Acute upper respiratory infections of unspecified site   2. Acute sinusitis    Meds  ordered this encounter  Medications  . azithromycin (ZITHROMAX) 250 MG tablet    Sig: As directed    Dispense:  6 each    Refill:  0    Order Specific Question:  Supervising Provider    Answer:  Ernestina Penna [[id number]]   1. Take meds as prescribed 2. Use a cool mist humidifier especially during the winter months and when heat has  been humid. 3. Use saline nose sprays frequently 4. Saline irrigations of the nose can be very helpful if done frequently.  * 4X daily for 1 week*  * Use of a nettie pot can be helpful with this. Follow directions with this* 5. Drink plenty of fluids 6. Keep thermostat turn down low 7.For any cough or congestion  Use plain Mucinex- regular strength or max strength is fine   * Children- consult with Pharmacist for dosing 8. For fever or aces or pains- take tylenol or ibuprofen appropriate for age and weight.  * for fevers greater than 101 orally you may alternate ibuprofen and tylenol every  3 hours.   Mary-Margaret Daphine Deutscher, FNP

## 2013-05-26 NOTE — ED Notes (Signed)
States does not need to void and does not need to provide urine for pregnancy due to her tubal ligation in 2012

## 2013-05-26 NOTE — Patient Instructions (Signed)

## 2013-05-26 NOTE — ED Provider Notes (Signed)
CSN: [id number]     Arrival date & time 05/26/13  2133 History  This chart was scribed for Jaime Lennert, MD by Bennett Scrape, ED Scribe. This patient was seen in room APA15/AP[facility]15 and the patient's care was started at 9:50 PM.   CC: Emesis  Patient is a 27 y.o. female presenting with vomiting. The history is provided by the patient. No language interpreter was used.  Emesis Severity:  Mild Duration: today. Timing:  Intermittent Emesis appearance: phlegm. Chronicity:  New Context: post-tussive   Associated symptoms: diarrhea and myalgias   Associated symptoms: no abdominal pain and no headaches     HPI Comments: Jaime Flores is a 27 y.o. female who presents to the Emergency Department complaining of 2 days of cough with associated rhinorrhea. She was seen by a MD and was told that she has an URI. She states that since her appointment today she has started vomiting and having diarrhea causing her concern. She describes the emesis as mostly phlegm. She was started on a Zpack today. She denies any abdominal pain or fevers. She denies having an influenza vaccine this year stating that it usually causes URIs.   Past Medical History  Diagnosis Date  . UTI (urinary tract infection)   . Amenorrhea 01/27/2013   Past Surgical History  Procedure Laterality Date  . Cholecystectomy    . Appendectomy    . Tubal ligation     Family History  Problem Relation Age of Onset  . Stroke Other   . Diabetes Other   . Heart failure Other   . Autism Son    History  Substance Use Topics  . Smoking status: Current Every Day Smoker -- 0.50 packs/day for 5 years    Types: Cigarettes  . Smokeless tobacco: Never Used  . Alcohol Use: No   OB History   Grav Para Term Preterm Abortions TAB SAB Ect Mult Living   2 2 1 1      2      Review of Systems  Constitutional: Negative for appetite change and fatigue.  HENT: Positive for rhinorrhea. Negative for congestion, ear discharge and sinus  pressure.   Eyes: Negative for discharge.  Respiratory: Positive for cough. Negative for shortness of breath.   Cardiovascular: Negative for chest pain.  Gastrointestinal: Positive for nausea, vomiting and diarrhea. Negative for abdominal pain.  Genitourinary: Negative for frequency and hematuria.  Musculoskeletal: Positive for myalgias. Negative for back pain.  Skin: Negative for rash.  Neurological: Negative for seizures and headaches.  Psychiatric/Behavioral: Negative for hallucinations.    Allergies  Doxycycline  Home Medications   Current Outpatient Rx  Name  Route  Sig  Dispense  Refill  . azithromycin (ZITHROMAX) 250 MG tablet      As directed   6 each   0   . PROCTOFOAM HC rectal foam      APPLY 2 TO 3 TIMES DAILY TO AFFECTED AREA.   10 g   1    Triage Vitals: BP 111/79  Pulse 92  Temp(Src) 98.4 F (36.9 C) (Oral)  Resp 18  Wt 133 lb (60.328 kg)  SpO2 98%  LMP 04/25/2013  Physical Exam  Nursing note and vitals reviewed. Constitutional: She is oriented to person, place, and time. She appears well-developed and well-nourished.  HENT:  Head: Normocephalic and atraumatic.  Mild tenderness to maxillary sinuses   Eyes: Conjunctivae and EOM are normal. No scleral icterus.  Neck: Neck supple. No thyromegaly present.  Cardiovascular: Normal  rate and regular rhythm.  Exam reveals no gallop and no friction rub.   No murmur heard. Pulmonary/Chest: Effort normal and breath sounds normal. No stridor. She has no wheezes. She has no rales. She exhibits no tenderness.  Abdominal: She exhibits no distension. There is no tenderness. There is no rebound.  Musculoskeletal: Normal range of motion. She exhibits no edema.  Lymphadenopathy:    She has no cervical adenopathy.  Neurological: She is alert and oriented to person, place, and time. She exhibits normal muscle tone. Coordination normal.  Skin: Skin is warm and dry. No rash noted. No erythema.  Psychiatric: She has a  normal mood and affect. Her behavior is normal.    ED Course  Procedures (including critical care time)  DIAGNOSTIC STUDIES: Oxygen Saturation is 98% on room air, normal by my interpretation.    COORDINATION OF CARE: 9:54 PM-Discussed treatment plan which includes CXR with pt at bedside and pt agreed to plan.    10:49 PM-Informed pt of radiology results. Discussed discharge plan which includes continuing antibiotics and ibuprofen with pt and pt agreed to plan. Also advised pt to follow up as needed and pt agreed. Addressed symptoms to return for with pt.   Labs Review Labs Reviewed - No data to display Imaging Review Dg Chest 2 View  05/26/2013   CLINICAL DATA:  Cough and congestion. History of smoking.  EXAM: CHEST  2 VIEW  COMPARISON:  CHEST x-ray 01/30/2011.  FINDINGS: Lung volumes are normal. No consolidative airspace disease. No pleural effusions. No pneumothorax. No pulmonary nodule or mass noted. Pulmonary vasculature and the cardiomediastinal silhouette are within normal limits. Surgical clips project over the right upper quadrant of the abdomen, likely from prior cholecystectomy.  IMPRESSION: 1.  No radiographic evidence of acute cardiopulmonary disease.   Electronically Signed   By: Trudie Reed M.D.   On: 05/26/2013 22:36    EKG Interpretation   None       MDM  No diagnosis found. The chart was scribed for me under my direct supervision.  I personally performed the history, physical, and medical decision making and all procedures in the evaluation of this patient.Jaime Lennert, MD 05/26/13 2251

## 2013-05-26 NOTE — ED Notes (Addendum)
Pt reporting URI symptoms, nausea, diarrhea for 2 days.  Also reporting generalized body aches. Reports she was started on Zpack today.

## 2013-05-26 NOTE — Telephone Encounter (Signed)
Imodium ad OTC 

## 2013-05-28 NOTE — Telephone Encounter (Signed)
Just viral and has to run its course

## 2013-05-28 NOTE — Telephone Encounter (Signed)
Pt aware.

## 2013-05-28 NOTE — Telephone Encounter (Signed)
Pt better. No n/v this morning. Went to ER Tuesday night.

## 2013-06-08 ENCOUNTER — Telehealth: Payer: Self-pay | Admitting: Adult Health

## 2013-06-08 NOTE — Telephone Encounter (Signed)
Spoke with pt. Started period Wednesday. Had period good until Friday. Now, just having brown discharge. Had tubes tied. Pt states this happened before and it turned into an infection. I advised could wait and see what happens but pt felt more comfortable making an appt with JAG. Call transferred to front desk to make an appt. JSY

## 2013-06-10 ENCOUNTER — Ambulatory Visit (INDEPENDENT_AMBULATORY_CARE_PROVIDER_SITE_OTHER): Payer: Medicaid Other | Admitting: Adult Health

## 2013-06-10 ENCOUNTER — Telehealth: Payer: Self-pay | Admitting: Adult Health

## 2013-06-10 ENCOUNTER — Encounter: Payer: Self-pay | Admitting: Adult Health

## 2013-06-10 VITALS — BP 122/64 | Ht 62.5 in | Wt 131.0 lb

## 2013-06-10 DIAGNOSIS — N898 Other specified noninflammatory disorders of vagina: Secondary | ICD-10-CM

## 2013-06-10 DIAGNOSIS — A499 Bacterial infection, unspecified: Secondary | ICD-10-CM

## 2013-06-10 DIAGNOSIS — N760 Acute vaginitis: Secondary | ICD-10-CM

## 2013-06-10 DIAGNOSIS — B9689 Other specified bacterial agents as the cause of diseases classified elsewhere: Secondary | ICD-10-CM

## 2013-06-10 HISTORY — DX: Other specified noninflammatory disorders of vagina: N89.8

## 2013-06-10 MED ORDER — METRONIDAZOLE 500 MG PO TABS: 500.0000 mg | ORAL_TABLET | Freq: Two times a day (BID) | ORAL | Status: DC

## 2013-06-10 MED ORDER — HYDROCORTISONE ACE-PRAMOXINE 1-1 % RE FOAM: 1.0000 | Freq: Two times a day (BID) | RECTAL | Status: DC

## 2013-06-10 NOTE — Patient Instructions (Signed)
Rx flagyl 500 mg 1 bid x 7 days, no alcohol, review handout on BV   NO SEX during treatment Follow up prn Bacterial Vaginosis Bacterial vaginosis (BV) is a vaginal infection where the normal balance of bacteria in the vagina is disrupted. The normal balance is then replaced by an overgrowth of certain bacteria. There are several different kinds of bacteria that can cause BV. BV is the most common vaginal infection in women of childbearing age. CAUSES   The cause of BV is not fully understood. BV develops when there is an increase or imbalance of harmful bacteria.  Some activities or behaviors can upset the normal balance of bacteria in   the vagina and put women at increased risk including:  Having a new sex partner or multiple sex partners.  Douching.  Using an intrauterine device (IUD) for contraception.  It is not clear what role sexual activity plays in the development of BV. However, women that have never had sexual intercourse are rarely infected with BV. Women do not get BV from toilet seats, bedding, swimming pools or from touching objects around them.  SYMPTOMS   Grey vaginal discharge.  A fish-like odor with discharge, especially after sexual intercourse.  Itching or burning of the vagina and vulva.  Burning or pain with urination.  Some women have no signs or symptoms at all. DIAGNOSIS  Your caregiver must examine the vagina for signs of BV. Your caregiver will perform lab tests and look at the sample of vaginal fluid through a microscope. They will look for bacteria and abnormal cells (clue cells), a pH test higher than 4.5, and a positive amine test all associated with BV.  RISKS AND COMPLICATIONS   Pelvic inflammatory disease (PID).  Infections following gynecology surgery.  Developing HIV.  Developing herpes virus. TREATMENT  Sometimes BV will clear up without treatment. However, all women with symptoms of BV should be treated to avoid complications,  especially if gynecology surgery is planned. Female partners generally do not need to be treated. However, BV may spread between female sex partners so treatment is helpful in preventing a recurrence of BV.   BV may be treated with antibiotics. The antibiotics come in either pill or vaginal cream forms. Either can be used with nonpregnant or pregnant women, but the recommended dosages differ. These antibiotics are not harmful to the baby.  BV can recur after treatment. If this happens, a second round of antibiotics will often be prescribed.  Treatment is important for pregnant women. If not treated, BV can cause a premature delivery, especially for a pregnant woman who had a premature birth in the past. All pregnant women who have symptoms of BV should be checked and treated.  For chronic reoccurrence of BV, treatment with a type of prescribed gel vaginally twice a week is helpful. HOME CARE INSTRUCTIONS   Finish all medication as directed by your caregiver.  Do not have sex until treatment is completed.  Tell your sexual partner that you have a vaginal infection. They should see their caregiver and be treated if they have problems, such as a mild rash or itching.  Practice safe sex. Use condoms. Only have 1 sex partner. PREVENTION  Basic prevention steps can help reduce the risk of upsetting the natural balance of bacteria in the vagina and developing BV:  Do not have sexual intercourse (be abstinent).  Do not douche.  Use all of the medicine prescribed for treatment of BV, even if the signs and symptoms go  away.  Tell your sex partner if you have BV. That way, they can be treated, if needed, to prevent reoccurrence. SEEK MEDICAL CARE IF:   Your symptoms are not improving after 3 days of treatment.  You have increased discharge, pain, or fever. MAKE SURE YOU:   Understand these instructions.  Will watch your condition.  Will get help right away if you are not doing well or get  worse. FOR MORE INFORMATION  Division of STD Prevention (DSTDP), Centers for Disease Control and Prevention: [employer] American Social Health Association (ASHA): www.ashastd.org  Document Released: 07/09/2005 Document Revised: 10/01/2011 Document Reviewed: 02/18/2013 [facility] Patient Information 2014 [place], [place].

## 2013-06-10 NOTE — Progress Notes (Signed)
Subjective:     Patient ID: Jaime Flores, female   DOB: 11-Mar-1986, 27 y.o.   MRN: [id number]  HPI Jaime Flores is a 27 year old white female in complaining of brownish vaginal discharge and itching,Has noticed odor, has same partner for 1.5 years.  Review of Systems See HPI Reviewed past medical,surgical, social and family history. Reviewed medications and allergies.     Objective:   Physical Exam BP 122/64  Ht 5' 2.5" (1.588 m)  Wt 131 lb (59.421 kg)  BMI 23.56 kg/m2  LMP 06/03/2013   Skin warm and dry.Pelvic: external genitalia is normal in appearance, vagina: tannish discharge without odor, cervix:smooth and bulbous, uterus: normal size, shape and contour, non tender, no masses felt, adnexa: no masses or tenderness noted. Wet prep: + for clue cells and +WBCs. GC/CHL obtained.   Assessment:     Vaginal discharge BV    Plan:    GC/CHL sent Rx flagyl 500 mg 1 bid x 7 days, no alcohol, review handout on BV   No sex during treatment if does use condoms or withdrawal Follow up GC/CHL in am

## 2013-06-10 NOTE — Telephone Encounter (Signed)
Will refill proctofoam.  

## 2013-06-11 ENCOUNTER — Telehealth: Payer: Self-pay | Admitting: Adult Health

## 2013-06-11 NOTE — Telephone Encounter (Signed)
Pt aware GC/CHL negative, use cream on area that is affected

## 2013-07-07 ENCOUNTER — Telehealth: Payer: Self-pay | Admitting: *Deleted

## 2013-07-07 NOTE — Telephone Encounter (Signed)
Pt states has not had her period yet for this month, usually cycle is regular. Pt had a tubal ligation. Informed pt to give it a little more time if has not started period to call office back for an appt. Pt verbalized understanding.

## 2013-07-31 ENCOUNTER — Ambulatory Visit: Payer: Medicaid Other | Admitting: Adult Health

## 2013-07-31 ENCOUNTER — Telehealth: Payer: Self-pay | Admitting: Adult Health

## 2013-07-31 NOTE — Telephone Encounter (Signed)
Spoke with pt. Pt states period started Sunday and stopped Wednesday. Pt having a brown discharge. She states the discharge is "cold" when it comes out. Offered appt today at 10:30 but pt couldn't come in then. Call transferred to front desk to schedule an appt for next week. Pt voiced understanding. [place]

## 2013-08-07 ENCOUNTER — Encounter: Payer: Self-pay | Admitting: Adult Health

## 2013-08-07 ENCOUNTER — Ambulatory Visit (INDEPENDENT_AMBULATORY_CARE_PROVIDER_SITE_OTHER): Payer: Medicaid Other | Admitting: Adult Health

## 2013-08-07 VITALS — BP 110/60 | Ht 62.5 in | Wt 129.0 lb

## 2013-08-07 DIAGNOSIS — N898 Other specified noninflammatory disorders of vagina: Secondary | ICD-10-CM

## 2013-08-07 DIAGNOSIS — Z113 Encounter for screening for infections with a predominantly sexual mode of transmission: Secondary | ICD-10-CM

## 2013-08-07 HISTORY — DX: Encounter for screening for infections with a predominantly sexual mode of transmission: Z11.3

## 2013-08-07 LAB — POCT WET PREP (WET MOUNT)
WBC, Wet Prep HPF POC: POSITIVE
WBC, Wet Prep HPF POC: POSITIVE

## 2013-08-07 NOTE — Progress Notes (Signed)
Subjective:     Patient ID: Jaime Flores, female   DOB: [birth year], 28 y.o.   MRN: [id number]  HPI Jaime Flores is a 28 year old white female, single in complaining of vaginal discharge and odor and itchy spot left labia.Wants STD testing.  Review of Systems See HPI Reviewed past medical,surgical, social and family history. Reviewed medications and allergies.     Objective:   Physical Exam BP 110/60  Ht 5' 2.5" (1.588 m)  Wt 129 lb (58.514 kg)  BMI 23.20 kg/m2  LMP 07/26/2013   Skin warm and dry.Pelvic: external genitalia is normal in appearance, no lesions seen, vagina: scant white discharge without odor, cervix: bulbous and everted at os, uterus: normal size, shape and contour, non tender, no masses felt, adnexa: no masses or tenderness noted. Wet prep: +WBCs. GC/CHL obtained.   Assessment:     Vaginal discharge STD screening    Plan:    Send GC/CHL Check HIV,RPR, HSV 2 Call me Monday for results or look at my chart   Follow up prn

## 2013-08-07 NOTE — Patient Instructions (Signed)
Call me Monday or look at my chart

## 2013-08-08 LAB — RPR

## 2013-08-08 LAB — GC/CHLAMYDIA PROBE AMP
CT PROBE, AMP APTIMA: NEGATIVE
GC PROBE AMP APTIMA: NEGATIVE

## 2013-08-08 LAB — HIV ANTIBODY (ROUTINE TESTING W REFLEX): HIV: NONREACTIVE

## 2013-08-09 ENCOUNTER — Encounter: Payer: Self-pay | Admitting: Adult Health

## 2013-08-10 LAB — HSV 2 ANTIBODY, IGG

## 2013-08-19 ENCOUNTER — Encounter: Payer: Self-pay | Admitting: Adult Health

## 2013-08-20 NOTE — Telephone Encounter (Signed)
Pt informed that the HSV is the Herpes Simplex Virus and all labs results from 08/07/2013 were negative.

## 2013-08-21 ENCOUNTER — Telehealth: Payer: Self-pay | Admitting: *Deleted

## 2013-08-21 NOTE — Telephone Encounter (Signed)
Left message- call returned

## 2013-10-29 ENCOUNTER — Telehealth: Payer: Self-pay | Admitting: Family Medicine

## 2013-10-29 NOTE — Telephone Encounter (Signed)
Appt scheduled for tomorrow.  °

## 2013-10-30 ENCOUNTER — Ambulatory Visit (INDEPENDENT_AMBULATORY_CARE_PROVIDER_SITE_OTHER): Payer: Medicaid Other | Admitting: Family Medicine

## 2013-10-30 VITALS — BP 105/61 | HR 67 | Temp 97.9°F | Ht 62.5 in | Wt 126.8 lb

## 2013-10-30 DIAGNOSIS — J029 Acute pharyngitis, unspecified: Secondary | ICD-10-CM

## 2013-10-30 MED ORDER — AZITHROMYCIN 250 MG PO TABS: ORAL_TABLET | ORAL | Status: DC

## 2013-10-30 NOTE — Progress Notes (Signed)
   Subjective:    Patient ID: Jaime Flores, female    DOB: 13-Feb-1986, 28 y.o.   MRN: [id number]  HPI  This 28 y.o. female presents for evaluation of sore throat and uri sx's for 2 days.  Review of Systems    No chest pain, SOB, HA, dizziness, vision change, N/V, diarrhea, constipation, dysuria, urinary urgency or frequency, myalgias, arthralgias or rash.  Objective:   Physical Exam  Vital signs noted  Well developed well nourished female.  HEENT - Head atraumatic Normocephalic                Eyes - PERRLA, Conjuctiva - clear Sclera- Clear EOMI                Ears - EAC's Wnl TM's Wnl Gross Hearing WNL                Nose - Nares patent                 Throat - oropharanx injected Respiratory - Lungs CTA bilateral Cardiac - RRR S1 and S2 without murmur GI - Abdomen soft Nontender and bowel sounds active x 4      Assessment & Plan:  Acute pharyngitis - Plan: azithromycin (ZITHROMAX) 250 MG tablet Push po fluids, rest, tylenol and motrin otc prn as directed for fever, arthralgias, and myalgias.  Follow up prn if sx's continue or persist. Lysbeth Penner FNP

## 2013-12-15 ENCOUNTER — Telehealth: Payer: Self-pay | Admitting: Nurse Practitioner

## 2013-12-15 NOTE — Telephone Encounter (Signed)
Telephone call from mom wanting drug screen on her 84 and 26 yr old children. Mom believes her children have been exposed to drugs. Advised mom we wouldn't do drug screen for that age and she needs to contact authorities to report this. She would need to take to ER and have done.

## 2014-01-25 ENCOUNTER — Telehealth: Payer: Self-pay | Admitting: Family Medicine

## 2014-01-26 ENCOUNTER — Ambulatory Visit: Payer: Medicaid Other | Admitting: Family Medicine

## 2014-01-26 NOTE — Telephone Encounter (Signed)
appt given for 7/8 with christy

## 2014-01-27 ENCOUNTER — Encounter: Payer: Self-pay | Admitting: Family

## 2014-01-27 ENCOUNTER — Ambulatory Visit (INDEPENDENT_AMBULATORY_CARE_PROVIDER_SITE_OTHER): Payer: Medicaid Other | Admitting: Family

## 2014-01-27 VITALS — BP 102/58 | HR 74 | Temp 98.7°F | Ht 62.5 in | Wt 132.0 lb

## 2014-01-27 DIAGNOSIS — G43009 Migraine without aura, not intractable, without status migrainosus: Secondary | ICD-10-CM

## 2014-01-27 MED ORDER — INDOMETHACIN 25 MG PO CAPS: 25.0000 mg | ORAL_CAPSULE | Freq: Three times a day (TID) | ORAL | Status: DC | PRN

## 2014-01-27 NOTE — Progress Notes (Signed)
   Subjective:    Patient ID: Jaime Flores, female    DOB: 1985/10/25, 28 y.o.   MRN: [id number]  Headache  This is a new problem. The current episode started 1 to 4 weeks ago. The problem occurs intermittently. The problem has been waxing and waning. The pain is located in the left unilateral region. The pain radiates to the upper back. The pain quality is not similar to prior headaches. The quality of the pain is described as band-like, aching and sharp. The pain is at a severity of 8/10. The pain is moderate. Associated symptoms include dizziness, phonophobia (At times) and photophobia (at times). Pertinent negatives include no blurred vision, ear pain, eye pain, fever, scalp tenderness, seizures, sinus pressure or visual change. She has tried NSAIDs for the symptoms. The treatment provided mild relief. Her past medical history is significant for migraine headaches and migraines in the family.      Review of Systems  Constitutional: Negative for fever.  HENT: Negative for ear pain and sinus pressure.   Eyes: Positive for photophobia (at times). Negative for blurred vision and pain.  Neurological: Positive for dizziness and headaches. Negative for seizures.  All other systems reviewed and are negative.      Objective:   Physical Exam  Vitals reviewed. Constitutional: She is oriented to person, place, and time. She appears well-developed and well-nourished. No distress.  HENT:  Head: Normocephalic and atraumatic.  Right Ear: External ear normal.  Left Ear: External ear normal.  Mouth/Throat: Oropharynx is clear and moist.  Eyes: Pupils are equal, round, and reactive to light.  Neck: Normal range of motion. Neck supple. No thyromegaly present.  Cardiovascular: Normal rate, regular rhythm, normal heart sounds and intact distal pulses.   No murmur heard. Pulmonary/Chest: Effort normal and breath sounds normal. No respiratory distress. She has no wheezes.  Abdominal: Soft. Bowel  sounds are normal. She exhibits no distension. There is no tenderness.  Musculoskeletal: Normal range of motion. She exhibits no edema and no tenderness.  Neurological: She is alert and oriented to person, place, and time. She has normal reflexes. No cranial nerve deficit.  Skin: Skin is warm and dry.  Psychiatric: She has a normal mood and affect. Her behavior is normal. Judgment and thought content normal.    BP 102/58  Pulse 74  Temp(Src) 98.7 F (37.1 C) (Oral)  Ht 5' 2.5" (1.588 m)  Wt 132 lb (59.875 kg)  BMI 23.74 kg/m2       Assessment & Plan:  1. Migraine without aura and without status migrainosus, not intractable -Stop smoking -Get enough sleep -Limit alcohol and caffeine -Discusses stress mangament - indomethacin (INDOCIN) 25 MG capsule; Take 1 capsule (25 mg total) by mouth 3 (three) times daily as needed for moderate pain.  Dispense: 30 capsule; Refill: 1 -RTO if pain continues  Evelina Dun, FNP

## 2014-01-27 NOTE — Patient Instructions (Signed)

## 2014-02-02 ENCOUNTER — Ambulatory Visit (INDEPENDENT_AMBULATORY_CARE_PROVIDER_SITE_OTHER): Payer: Medicaid Other | Admitting: Family

## 2014-02-02 ENCOUNTER — Telehealth: Payer: Self-pay | Admitting: Family

## 2014-02-02 ENCOUNTER — Encounter: Payer: Self-pay | Admitting: Family

## 2014-02-02 VITALS — BP 90/53 | HR 62 | Temp 98.4°F | Ht 62.5 in | Wt 132.6 lb

## 2014-02-02 DIAGNOSIS — G43001 Migraine without aura, not intractable, with status migrainosus: Secondary | ICD-10-CM

## 2014-02-02 MED ORDER — TOPIRAMATE 50 MG PO TABS: 25.0000 mg | ORAL_TABLET | Freq: Two times a day (BID) | ORAL | Status: DC

## 2014-02-02 NOTE — Patient Instructions (Signed)

## 2014-02-02 NOTE — Progress Notes (Signed)
   Subjective:    Patient ID: Jaime Flores, female    DOB: [birth year], 28 y.o.   MRN: [id number]  Pt was seen in office two weeks ago for headache. Pt was given rx for indocin prn. Pt states she is still having daily headaches.  Headache  This is a recurrent problem. The current episode started more than 1 month ago. The problem occurs daily. The problem has been waxing and waning. The pain is located in the left unilateral region. The pain radiates to the upper back. The pain quality is similar to prior headaches. The quality of the pain is described as squeezing. The pain is at a severity of 8/10. The pain is moderate. Associated symptoms include nausea, phonophobia, photophobia and vomiting. Pertinent negatives include no blurred vision, dizziness, eye redness, eye watering, fever, neck pain, seizures, tingling or tinnitus. Nothing aggravates the symptoms. She has tried acetaminophen for the symptoms. The treatment provided mild relief. Her past medical history is significant for migraine headaches and migraines in the family.      Review of Systems  Constitutional: Negative.  Negative for fever.  HENT: Negative for tinnitus.   Eyes: Positive for photophobia. Negative for blurred vision and redness.  Respiratory: Negative.  Negative for shortness of breath.   Cardiovascular: Negative.  Negative for palpitations.  Gastrointestinal: Positive for nausea and vomiting.  Endocrine: Negative.   Genitourinary: Negative.   Musculoskeletal: Negative.  Negative for neck pain.  Neurological: Positive for headaches. Negative for dizziness, tingling and seizures.  Hematological: Negative.   Psychiatric/Behavioral: Negative.   All other systems reviewed and are negative.      Objective:   Physical Exam  Vitals reviewed. Constitutional: She is oriented to person, place, and time. She appears well-developed and well-nourished. No distress.  HENT:  Head: Normocephalic and atraumatic.  Right  Ear: External ear normal.  Mouth/Throat: Oropharynx is clear and moist.  Eyes: Pupils are equal, round, and reactive to light.  Neck: Normal range of motion. Neck supple. No thyromegaly present.  Cardiovascular: Normal rate, regular rhythm, normal heart sounds and intact distal pulses.   No murmur heard. Pulmonary/Chest: Effort normal and breath sounds normal. No respiratory distress. She has no wheezes.  Abdominal: Soft. Bowel sounds are normal. She exhibits no distension. There is no tenderness.  Musculoskeletal: Normal range of motion. She exhibits no edema and no tenderness.  Neurological: She is alert and oriented to person, place, and time. She has normal reflexes. No cranial nerve deficit.  Skin: Skin is warm and dry.  Psychiatric: She has a normal mood and affect. Her behavior is normal. Judgment and thought content normal.    BP 90/53  Pulse 62  Temp(Src) 98.4 F (36.9 C) (Oral)  Ht 5' 2.5" (1.588 m)  Wt 132 lb 9.6 oz (60.147 kg)  BMI 23.85 kg/m2       Assessment & Plan:  1. Migraine without aura and with status migrainosus, not intractable -Discussed birth control as topamax is category D - Anemia Profile B - Thyroid Panel With TSH - Vit D  25 hydroxy (rtn osteoporosis monitoring) - CMP14+EGFR - topiramate (TOPAMAX) 50 MG tablet; Take 0.5 tablets (25 mg total) by mouth 2 (two) times daily.  Dispense: 30 tablet; Refill: 1 -RTO in 1 week  Evelina Dun, FNP

## 2014-02-03 ENCOUNTER — Other Ambulatory Visit: Payer: Self-pay | Admitting: Family

## 2014-02-03 LAB — ANEMIA PROFILE B
BASOS ABS: 0 10*3/uL (ref 0.0–0.2)
Basos: 0 %
EOS ABS: 0.1 10*3/uL (ref 0.0–0.4)
Eos: 2 %
FERRITIN: 34 ng/mL (ref 15–150)
FOLATE: 6.8 ng/mL (ref >3.0)
HCT: 41.2 % (ref 34.0–46.6)
HEMOGLOBIN: 13.1 g/dL (ref 11.1–15.9)
Immature Grans (Abs): 0 10*3/uL (ref 0.0–0.1)
Immature Granulocytes: 0 %
Iron Saturation: 12 % — ABNORMAL LOW (ref 15–55)
Iron: 36 ug/dL (ref 35–155)
LYMPHS ABS: 2.5 10*3/uL (ref 0.7–3.1)
Lymphs: 29 %
MCH: 29 pg (ref 26.6–33.0)
MCHC: 31.8 g/dL (ref 31.5–35.7)
MCV: 91 fL (ref 79–97)
Monocytes Absolute: 0.4 10*3/uL (ref 0.1–0.9)
Monocytes: 5 %
NEUTROS ABS: 5.5 10*3/uL (ref 1.4–7.0)
Neutrophils Relative %: 64 %
Platelets: 325 10*3/uL (ref 150–379)
RBC: 4.52 x10E6/uL (ref 3.77–5.28)
RDW: 13.7 % (ref 12.3–15.4)
Retic Ct Pct: 0.9 % (ref 0.6–2.6)
TIBC: 303 ug/dL (ref 250–450)
UIBC: 267 ug/dL (ref 150–375)
VITAMIN B 12: 338 pg/mL (ref 211–946)
WBC: 8.6 10*3/uL (ref 3.4–10.8)

## 2014-02-03 LAB — CMP14+EGFR
ALBUMIN: 4.6 g/dL (ref 3.5–5.5)
ALT: 24 IU/L (ref 0–32)
AST: 17 IU/L (ref 0–40)
Albumin/Globulin Ratio: 1.9 (ref 1.1–2.5)
Alkaline Phosphatase: 97 IU/L (ref 39–117)
BUN/Creatinine Ratio: 11 (ref 8–20)
BUN: 7 mg/dL (ref 6–20)
CALCIUM: 9.5 mg/dL (ref 8.7–10.2)
CHLORIDE: 101 mmol/L (ref 97–108)
CO2: 26 mmol/L (ref 18–29)
CREATININE: 0.62 mg/dL (ref 0.57–1.00)
GFR calc Af Amer: 145 mL/min/{1.73_m2} (ref >59)
GFR calc non Af Amer: 126 mL/min/{1.73_m2} (ref >59)
GLOBULIN, TOTAL: 2.4 g/dL (ref 1.5–4.5)
Glucose: 74 mg/dL (ref 65–99)
Potassium: 4.1 mmol/L (ref 3.5–5.2)
SODIUM: 142 mmol/L (ref 134–144)
Total Bilirubin: <0.2 mg/dL (ref 0.0–1.2)
Total Protein: 7 g/dL (ref 6.0–8.5)

## 2014-02-03 LAB — THYROID PANEL WITH TSH
FREE THYROXINE INDEX: 2.3 (ref 1.2–4.9)
T3 UPTAKE RATIO: 25 % (ref 24–39)
T4, Total: 9 ug/dL (ref 4.5–12.0)
TSH: 1.86 u[IU]/mL (ref 0.450–4.500)

## 2014-02-03 LAB — VITAMIN D 25 HYDROXY (VIT D DEFICIENCY, FRACTURES): VIT D 25 HYDROXY: 26.7 ng/mL — AB (ref 30.0–100.0)

## 2014-02-03 MED ORDER — VITAMIN D (ERGOCALCIFEROL) 1.25 MG (50000 UNIT) PO CAPS: 50000.0000 [IU] | ORAL_CAPSULE | ORAL | Status: DC

## 2014-02-03 MED ORDER — FERROUS SULFATE 90 (18 FE) MG PO TABS: 90.0000 mg | ORAL_TABLET | Freq: Two times a day (BID) | ORAL | Status: DC

## 2014-02-03 NOTE — Telephone Encounter (Signed)
Patient wanted to schedule the following week so appt given for 7/31 with christy

## 2014-02-19 ENCOUNTER — Ambulatory Visit: Payer: Medicaid Other | Admitting: Family

## 2014-04-06 ENCOUNTER — Telehealth: Payer: Self-pay | Admitting: Adult Health

## 2014-04-06 NOTE — Telephone Encounter (Signed)
Line busy @ 10:13 am. [employer]

## 2014-04-06 NOTE — Telephone Encounter (Signed)
Spoke with pt. Pt woke up with pelvic pain this am. It lasted until around 11:30am. Pt did have pelvic pain over the weekend also. I advised she would need to be seen. Pt voiced understanding. Call transferred to front desk for appt. Jaime Flores

## 2014-04-08 ENCOUNTER — Ambulatory Visit: Payer: Medicaid Other | Admitting: Advanced Practice Midwife

## 2014-04-13 ENCOUNTER — Ambulatory Visit: Payer: Medicaid Other | Admitting: Advanced Practice Midwife

## 2014-04-27 ENCOUNTER — Emergency Department (HOSPITAL_COMMUNITY)
Admission: EM | Admit: 2014-04-27 | Discharge: 2014-04-27 | Disposition: A | Discharge status: Home / Self Care | Payer: Medicaid Other | Attending: Emergency Medicine | Admitting: Emergency Medicine

## 2014-04-27 ENCOUNTER — Encounter (HOSPITAL_COMMUNITY): Payer: Self-pay | Admitting: Emergency Medicine

## 2014-04-27 DIAGNOSIS — Y9389 Activity, other specified: Secondary | ICD-10-CM | POA: Diagnosis not present

## 2014-04-27 DIAGNOSIS — X58XXXA Exposure to other specified factors, initial encounter: Secondary | ICD-10-CM | POA: Insufficient documentation

## 2014-04-27 DIAGNOSIS — Z79899 Other long term (current) drug therapy: Secondary | ICD-10-CM | POA: Insufficient documentation

## 2014-04-27 DIAGNOSIS — Z87448 Personal history of other diseases of urinary system: Secondary | ICD-10-CM | POA: Diagnosis not present

## 2014-04-27 DIAGNOSIS — Z87440 Personal history of urinary (tract) infections: Secondary | ICD-10-CM | POA: Diagnosis not present

## 2014-04-27 DIAGNOSIS — S233XXA Sprain of ligaments of thoracic spine, initial encounter: Secondary | ICD-10-CM | POA: Insufficient documentation

## 2014-04-27 DIAGNOSIS — Y929 Unspecified place or not applicable: Secondary | ICD-10-CM | POA: Insufficient documentation

## 2014-04-27 DIAGNOSIS — S3982XA Other specified injuries of lower back, initial encounter: Secondary | ICD-10-CM | POA: Diagnosis present

## 2014-04-27 DIAGNOSIS — Z720 Tobacco use: Secondary | ICD-10-CM | POA: Diagnosis not present

## 2014-04-27 DIAGNOSIS — S239XXA Sprain of unspecified parts of thorax, initial encounter: Secondary | ICD-10-CM

## 2014-04-27 MED ORDER — HYDROCODONE-ACETAMINOPHEN 5-325 MG PO TABS: ORAL_TABLET | ORAL | Status: DC

## 2014-04-27 MED ORDER — NAPROXEN 500 MG PO TABS: 500.0000 mg | ORAL_TABLET | Freq: Two times a day (BID) | ORAL | Status: DC

## 2014-04-27 MED ORDER — IBUPROFEN 800 MG PO TABS
800.0000 mg | ORAL_TABLET | Freq: Once | ORAL | Status: AC
  Administered 2014-04-27: 800 mg via ORAL
  Filled 2014-04-27: qty 1

## 2014-04-27 MED ORDER — CYCLOBENZAPRINE HCL 5 MG PO TABS: 10.0000 mg | ORAL_TABLET | Freq: Three times a day (TID) | ORAL | Status: DC | PRN

## 2014-04-27 NOTE — ED Notes (Signed)
Patient given discharge instruction, verbalized understand. Patient ambulatory out of the department.  

## 2014-04-27 NOTE — ED Provider Notes (Signed)
CSN: [id number]     Arrival date & time 04/27/14  2841 History   First MD Initiated Contact with Patient 04/27/14 1056     Chief Complaint  Patient presents with  . Back Pain     (Consider location/radiation/quality/duration/timing/severity/associated sxs/prior Treatment) HPI   Jaime Flores is a 28 y.o. female who presents to the Emergency Department complaining of upper back pain since 8:30 this morning when she picked her child up to put him in a car seat. She states she felt a pulling sensation across her shoulder blades. Pain is worse with movement of her arms and sitting in an upright position. She denies any neck pain, headache, numbness or tingling of her upper extremities, chest pain, or fall. She is not tried any therapies or taken any medication prior to ER arrival. Pain is improved at rest.   Past Medical History  Diagnosis Date  . UTI (urinary tract infection)   . Amenorrhea 01/27/2013  . Vaginal discharge 06/10/2013  . Screening for STD (sexually transmitted disease) 08/07/2013   Past Surgical History  Procedure Laterality Date  . Cholecystectomy    . Appendectomy    . Tubal ligation     Family History  Problem Relation Age of Onset  . Autism Son   . Diabetes Maternal Grandmother   . Stroke Maternal Grandmother   . Heart failure Maternal Grandmother   . Stroke Maternal Grandfather   . Stroke Father   . Other Mother     herpes   History  Substance Use Topics  . Smoking status: Current Every Day Smoker -- 0.50 packs/day for 5 years    Types: Cigarettes  . Smokeless tobacco: Never Used  . Alcohol Use: No   OB History   Grav Para Term Preterm Abortions TAB SAB Ect Mult Living   2 2 1 1      2      Review of Systems  Constitutional: Negative for fever.  Respiratory: Negative for shortness of breath.   Gastrointestinal: Negative for vomiting, abdominal pain and constipation.  Genitourinary: Negative for dysuria, hematuria, flank pain, decreased urine  volume and difficulty urinating.       Low back pain  Musculoskeletal: Positive for back pain. Negative for joint swelling, neck pain and neck stiffness.  Skin: Negative for rash.  Neurological: Negative for weakness and numbness.  All other systems reviewed and are negative.     Allergies  Doxycycline  Home Medications   Prior to Admission medications   Medication Sig Start Date End Date Taking? Authorizing Provider  Ferrous Sulfate 90 (18 FE) MG TABS Take 90 mg by mouth 2 (two) times daily. 02/03/14   Sharion Balloon, FNP  ibuprofen (ADVIL,MOTRIN) 800 MG tablet Take 1 tablet (800 mg total) by mouth every 8 (eight) hours as needed for moderate pain. 05/26/13   Maudry Diego, MD  indomethacin (INDOCIN) 25 MG capsule Take 1 capsule (25 mg total) by mouth 3 (three) times daily as needed for moderate pain. 01/27/14   Sharion Balloon, FNP  topiramate (TOPAMAX) 50 MG tablet Take 0.5 tablets (25 mg total) by mouth 2 (two) times daily. 02/02/14   Sharion Balloon, FNP  Vitamin D, Ergocalciferol, (DRISDOL) 50000 UNITS CAPS capsule Take 1 capsule (50,000 Units total) by mouth every 7 (seven) days. 02/03/14   Sharion Balloon, FNP   BP 97/47  Pulse 78  Temp(Src) 98.2 F (36.8 C) (Oral)  Resp 16  Ht 5\' 2"  (1.575 m)  Wt  132 lb (59.875 kg)  BMI 24.14 kg/m2  SpO2 100%  LMP 04/10/2014 Physical Exam  Nursing note and vitals reviewed. Constitutional: She is oriented to person, place, and time. She appears well-developed and well-nourished. No distress.  HENT:  Head: Normocephalic and atraumatic.  Neck: Normal range of motion. Neck supple.  Cardiovascular: Normal rate, regular rhythm, normal heart sounds and intact distal pulses.   No murmur heard. Pulmonary/Chest: Effort normal and breath sounds normal. No respiratory distress.  Abdominal: Soft. She exhibits no distension. There is no tenderness.  Musculoskeletal: She exhibits tenderness. She exhibits no edema.       Lumbar back: She exhibits  tenderness and pain. She exhibits normal range of motion, no swelling, no deformity, no laceration and normal pulse.  ttp of the bilateral thoracic spine and paraspinal muscles.  Radial pulses are brisk and symmetrical.  Distal sensation intact.  Hip Flexors/Extensors are intact.  Pt has 5/5 strength against resistance of bilateral upper extremities.     Neurological: She is alert and oriented to person, place, and time. She has normal strength. No sensory deficit. She exhibits normal muscle tone. Coordination and gait normal.  Reflex Scores:      Patellar reflexes are 2+ on the right side and 2+ on the left side.      Achilles reflexes are 2+ on the right side and 2+ on the left side. Skin: Skin is warm and dry. No rash noted.    ED Course  Procedures (including critical care time) Labs Review Labs Reviewed - No data to display  Imaging Review No results found.   EKG Interpretation None      MDM   Final diagnoses:  Thoracic back sprain, initial encounter    Pt is well appearing. No focal neuro deficits.  No concerning symptoms for emergent neurological or infectious process. Has localized tenderness to the upper back that is likely related to musculoskeletal injury. She agrees to symptomatic treatment and close followup with her doctor      [name] L. Flores , PA-C 04/27/14 1132

## 2014-04-27 NOTE — ED Notes (Addendum)
Pt states constant pain to mid back began at 0800. States it began suddenly after bending over to put daughter in car seat. NAD. Pt also states sharp pain to same area when taking a deep breath.

## 2014-04-27 NOTE — Discharge Instructions (Signed)

## 2014-04-28 NOTE — ED Provider Notes (Signed)
Medical screening examination/treatment/procedure(s) were performed by non-physician practitioner and as supervising physician I was immediately available for consultation/collaboration.   EKG Interpretation None       Nat Christen, MD 04/28/14 0730

## 2014-05-03 ENCOUNTER — Telehealth: Payer: Self-pay | Admitting: Nurse Practitioner

## 2014-05-03 NOTE — Telephone Encounter (Signed)
Patient states that she went to ER last week for back pain and was given flexeril, naproxen, and hydrocodone. Wants to know if its ok to have dental work and numbing while taking these medications. Advised patient that as long as she doesn't take any additional meds that just the numbing should be fine. She advised that she would reschedule dental appt just in case

## 2014-05-24 ENCOUNTER — Encounter (HOSPITAL_COMMUNITY): Payer: Self-pay | Admitting: Emergency Medicine

## 2014-05-25 ENCOUNTER — Other Ambulatory Visit: Payer: Self-pay | Admitting: Family Medicine

## 2014-05-25 MED ORDER — PERMETHRIN 5 % EX CREA: 1.0000 "application " | TOPICAL_CREAM | Freq: Once | CUTANEOUS | Status: DC

## 2014-07-13 ENCOUNTER — Telehealth: Payer: Self-pay | Admitting: Adult Health

## 2014-07-13 NOTE — Telephone Encounter (Signed)
Spoke with pt. Pt states she is having a brown vaginal discharge with a slight odor and pelvic pain. I advised she would need to be seen. Pt voiced understanding. Call transferred to front desk for appt. Milo

## 2014-07-14 ENCOUNTER — Ambulatory Visit (INDEPENDENT_AMBULATORY_CARE_PROVIDER_SITE_OTHER): Payer: Medicaid Other | Admitting: Women's Health

## 2014-07-14 ENCOUNTER — Encounter: Payer: Self-pay | Admitting: Women's Health

## 2014-07-14 VITALS — BP 102/44 | Ht 62.5 in | Wt 125.0 lb

## 2014-07-14 DIAGNOSIS — N898 Other specified noninflammatory disorders of vagina: Secondary | ICD-10-CM

## 2014-07-14 LAB — POCT WET PREP (WET MOUNT): Clue Cells Wet Prep Whiff POC: NEGATIVE

## 2014-07-14 NOTE — Progress Notes (Signed)
Patient ID: JANNETTA MASSEY, female   DOB: Nov 30, 1985, 28 y.o.   MRN: [id number]   [facility] Visit  Patient name: KIM OKI MRN [id number]  Date of birth: [birth year]  CC & HPI:  YAHAIRA BRUSKI is a 28 y.o. Caucasian female presenting today for report of brownish d/c after her period, was worried it may be BV, b/c it had been in the past. No odor, itching/irritation.   Pertinent History Reviewed:  Medical & Surgical Hx:   Past Medical History  Diagnosis Date  . UTI (urinary tract infection)   . Amenorrhea 01/27/2013  . Vaginal discharge 06/10/2013  . Screening for STD (sexually transmitted disease) 08/07/2013   Past Surgical History  Procedure Laterality Date  . Cholecystectomy    . Appendectomy    . Tubal ligation     Medications: Reviewed & Updated - see associated section Social History: Reviewed -  reports that she has been smoking Cigarettes.  She has a 2.5 pack-year smoking history. She has never used smokeless tobacco.  Objective Findings:  Vitals: BP 102/44 mmHg  Ht 5' 2.5" (1.588 m)  Wt 125 lb (56.7 kg)  BMI 22.48 kg/m2  LMP 07/09/2014  Physical Examination: General appearance - alert, well appearing, and in no distress Pelvic - normal external genitalia, vulva, vagina- tan nonodorous d/c, normal cervix  Results for orders placed or performed in visit on 07/14/14 (from the past 24 hour(s))  POCT Wet Prep [facility] [place])   Collection Time: 07/14/14  2:32 PM  Result Value Ref Range   Source Wet Prep POC vaginal    WBC, Wet Prep HPF POC few    Bacteria Wet Prep HPF POC none    BACTERIA WET PREP MORPHOLOGY POC     Clue Cells Wet Prep HPF POC None    Clue Cells Wet Prep Whiff POC Negative Whiff    Yeast Wet Prep HPF POC None    KOH Wet Prep POC     Trichomonas Wet Prep HPF POC none      Assessment & Plan:  A:   Normal d/c P:   F/U prn   Tawnya Crook CNM, [facility] 07/14/2014 2:32 PM

## 2015-03-10 ENCOUNTER — Ambulatory Visit: Payer: Medicaid Other | Admitting: Adult Health

## 2015-03-10 ENCOUNTER — Encounter: Payer: Self-pay | Admitting: Adult Health

## 2015-03-10 ENCOUNTER — Ambulatory Visit (INDEPENDENT_AMBULATORY_CARE_PROVIDER_SITE_OTHER): Payer: Medicaid Other | Admitting: Adult Health

## 2015-03-10 VITALS — BP 98/62 | HR 64 | Ht 62.5 in | Wt 120.5 lb

## 2015-03-10 DIAGNOSIS — N644 Mastodynia: Secondary | ICD-10-CM

## 2015-03-10 DIAGNOSIS — N6452 Nipple discharge: Secondary | ICD-10-CM

## 2015-03-10 HISTORY — DX: Mastodynia: N64.4

## 2015-03-10 NOTE — Patient Instructions (Signed)
Do not squeeze nipples Wear good support bra PATIENT INSTRUCTIONS FIBROCYSTIC BREAST DISEASE  FOLLOW-UP:  Please make an appointment with your physician in prn prn.Has appt next week for physical. Call your physician should you develop a new breast mass that is different, if one particular lump starts to enlarge, or if nipple discharge develops.   CAUSE:  Many women have some lumpiness within their breasts and these areas at times can become tender during certain times in your menstrual cycle.  These areas tend to feel like a firm rubber ball as compared to a cancer which will more commonly feel hard and almost rock-like.  Fibrocystic breast disease does not in and of itself increase your risk for breast cancer but you should be sure to examine yiour breasts at the same time of the month on a monthly basis.  If there are a lot of areas of lumpiness you should tape a piece of paper on the mirror with a diagram of your breasts, noting where the areas of lumpiness are and their relative size.  You can then refer to this diagram on a monthly basis to keep better track of any changes should they occur.  DIET:  You should try and avoid foods, or at least minimize foods, such as chocolate and caffeine which may cause the symptoms of tenderness to become worse.  ACTIVITY:  You may want to wear a bra that offers additional support, and/or consider a sports bra, especially during those times when your breasts are more tender.  MEDICATIONS:  Taking Vitamin E capsules twice a day along with Evening of Primrose Capsules three times a day, or as directed on the bottle, may help your symptoms.  These are both available over-the-counter and without a prescription. There is clinical evidence that these may help symptoms in some patients.   If your physician has prescribed medication for your fibrocystic breast disease, be sure to take it as instructed on the bottle and let him/her know if you have any side  effects.  QUESTIONS:  Please feel free to call your physician  if you have any questions, and they will be glad to assist you.

## 2015-03-10 NOTE — Progress Notes (Signed)
Subjective:     Patient ID: Jaime Flores, female   DOB: 1986/02/12, 29 y.o.   MRN: [id number]  HPI Jaime Flores is a 29 year old white female in complaining of soreness in both breasts and clear to cloudy discharge from right nipple, saw on Tee shirt Monday.No new meds or THC use, does smoke cigarettes and drinks a 2 liter of Code Red a day.   Review of Systems Patient denies any headaches, hearing loss, fatigue, blurred vision, shortness of breath, chest pain, abdominal pain, problems with bowel movements, urination, or intercourse. No joint pain or mood swings.See HPI for positives.  Reviewed past medical,surgical, social and family history. Reviewed medications and allergies.     Objective:   Physical Exam BP 98/62 mmHg  Pulse 64  Ht 5' 2.5" (1.588 m)  Wt 120 lb 8 oz (54.658 kg)  BMI 21.67 kg/m2  LMP 02/08/2015  Skin warm and dry,  Breasts:no dominate palpable mass, retraction or nipple discharge,has some regular irregularities in UOQ on both breasts where soreness is.     Assessment:     Breast pain Breast discharge    Plan:     Checked TSH and prolactin before exam Decrease caffeine, do not squeeze nipples, wear good bra Return next week for physical

## 2015-03-11 ENCOUNTER — Telehealth: Payer: Self-pay | Admitting: Adult Health

## 2015-03-11 LAB — TSH: TSH: 1.89 u[IU]/mL (ref 0.450–4.500)

## 2015-03-11 LAB — PROLACTIN: Prolactin: 9.7 ng/mL (ref 4.8–23.3)

## 2015-03-11 NOTE — Telephone Encounter (Signed)
Pt aware labs normal  

## 2015-03-17 ENCOUNTER — Other Ambulatory Visit: Payer: Medicaid Other | Admitting: Adult Health

## 2015-03-26 ENCOUNTER — Encounter (HOSPITAL_COMMUNITY): Payer: Self-pay | Admitting: Emergency Medicine

## 2015-03-26 ENCOUNTER — Emergency Department (HOSPITAL_COMMUNITY): Payer: Medicaid Other

## 2015-03-26 ENCOUNTER — Emergency Department (HOSPITAL_COMMUNITY)
Admission: EM | Admit: 2015-03-26 | Discharge: 2015-03-26 | Disposition: A | Discharge status: Home / Self Care | Payer: Medicaid Other | Attending: Emergency Medicine | Admitting: Emergency Medicine

## 2015-03-26 DIAGNOSIS — W208XXA Other cause of strike by thrown, projected or falling object, initial encounter: Secondary | ICD-10-CM | POA: Diagnosis not present

## 2015-03-26 DIAGNOSIS — Y9389 Activity, other specified: Secondary | ICD-10-CM | POA: Insufficient documentation

## 2015-03-26 DIAGNOSIS — Z8742 Personal history of other diseases of the female genital tract: Secondary | ICD-10-CM | POA: Diagnosis not present

## 2015-03-26 DIAGNOSIS — Y998 Other external cause status: Secondary | ICD-10-CM | POA: Insufficient documentation

## 2015-03-26 DIAGNOSIS — S99922A Unspecified injury of left foot, initial encounter: Secondary | ICD-10-CM | POA: Diagnosis present

## 2015-03-26 DIAGNOSIS — Z720 Tobacco use: Secondary | ICD-10-CM | POA: Diagnosis not present

## 2015-03-26 DIAGNOSIS — Y9289 Other specified places as the place of occurrence of the external cause: Secondary | ICD-10-CM | POA: Diagnosis not present

## 2015-03-26 DIAGNOSIS — S9032XA Contusion of left foot, initial encounter: Secondary | ICD-10-CM | POA: Diagnosis not present

## 2015-03-26 DIAGNOSIS — Z87440 Personal history of urinary (tract) infections: Secondary | ICD-10-CM | POA: Insufficient documentation

## 2015-03-26 MED ORDER — IBUPROFEN 400 MG PO TABS
600.0000 mg | ORAL_TABLET | Freq: Once | ORAL | Status: AC
  Administered 2015-03-26: 600 mg via ORAL
  Filled 2015-03-26: qty 2

## 2015-03-26 NOTE — Discharge Instructions (Signed)
Continue to take ibuprofen as needed for pain. Return as needed.

## 2015-03-26 NOTE — ED Notes (Signed)
Daughter threw an I Pad at pt and it landed on her lt foot- has pain and swelling

## 2015-03-26 NOTE — ED Provider Notes (Signed)
CSN: [id number]     Arrival date & time 03/26/15  1944 History   First MD Initiated Contact with Patient 03/26/15 2018     Chief Complaint  Patient presents with  . Foot Injury     (Consider location/radiation/quality/duration/timing/severity/associated sxs/prior Treatment) Patient is a 29 y.o. female presenting with foot injury. The history is provided by the patient.  Foot Injury Location:  Foot Injury: yes   Mechanism of injury comment:  Hit with an I Pad Foot location:  L foot Pain details:    Quality:  Throbbing   Radiates to:  Does not radiate   Severity:  Moderate   Onset quality:  Sudden   Timing:  Constant   Progression:  Worsening Chronicity:  New Dislocation: no   Foreign body present:  No foreign bodies Prior injury to area:  No Relieved by:  None tried Worsened by:  Bearing weight Ineffective treatments:  None tried Associated symptoms: swelling    Jaime Flores is a 29 y.o. female who presents to the ED with pain and swelling to the top of her left foot. She reports that her daughter threw an I Pad and it landed on her foot. She denies any other injuries.   Past Medical History  Diagnosis Date  . UTI (urinary tract infection)   . Amenorrhea 01/27/2013  . Vaginal discharge 06/10/2013  . Screening for STD (sexually transmitted disease) 08/07/2013  . Breast pain 03/10/2015   Past Surgical History  Procedure Laterality Date  . Cholecystectomy    . Appendectomy    . Tubal ligation     Family History  Problem Relation Age of Onset  . Autism Son   . Diabetes Maternal Grandmother   . Stroke Maternal Grandmother   . Heart failure Maternal Grandmother   . Stroke Maternal Grandfather   . Stroke Father   . Seizures Father   . Other Mother     herpes  . Heart failure Paternal Grandfather   . Diabetes Paternal Grandmother   . Hypertension Paternal Grandmother   . Autism Brother   . Other Paternal Uncle     heart exploded   Social History  Substance  Use Topics  . Smoking status: Current Every Day Smoker -- 1.00 packs/day for 6 years    Types: Cigarettes  . Smokeless tobacco: Never Used  . Alcohol Use: No   OB History    Gravida Para Term Preterm AB TAB SAB Ectopic Multiple Living   2 2 1 1      2      Review of Systems Negative except as stated in HPI   Allergies  Doxycycline  Home Medications   Prior to Admission medications   Not on File   BP 111/58 mmHg  Pulse 73  Temp(Src) 98.1 F (36.7 C) (Oral)  Resp 18  Ht 5' 2.5" (1.588 m)  Wt 121 lb (54.885 kg)  BMI 21.76 kg/m2  SpO2 100%  LMP 03/20/2015 (Approximate) Physical Exam  Constitutional: She is oriented to person, place, and time. She appears well-developed and well-nourished. No distress.  HENT:  Head: Normocephalic and atraumatic.  Eyes: Conjunctivae and EOM are normal.  Neck: Normal range of motion. Neck supple.  Cardiovascular: Normal rate.   Pulmonary/Chest: Effort normal.  Musculoskeletal:       Left foot: There is tenderness and swelling. There is normal range of motion and no laceration.       Feet:  Swelling, tenderness, ecchymosis to the dorsum of the left foot.  Pedal pulse 2+, adequate circulation, good touch sensation.   Neurological: She is alert and oriented to person, place, and time. No cranial nerve deficit.  Skin: Skin is warm and dry.  Psychiatric: She has a normal mood and affect. Her behavior is normal.  Nursing note and vitals reviewed.   ED Course  Procedures (including critical care time) Labs Review Labs Reviewed - No data to display  Imaging Review Dg Foot Complete Left  03/26/2015   CLINICAL DATA:  Acute onset of left foot pain and swelling. Initial encounter.  EXAM: LEFT FOOT - COMPLETE 3+ VIEW  COMPARISON:  None.  FINDINGS: There is no evidence of fracture or dislocation. The joint spaces are preserved. There is no evidence of talar subluxation; the subtalar joint is unremarkable in appearance.  No significant soft tissue  abnormalities are seen.  IMPRESSION: No evidence of fracture or dislocation.   Electronically Signed   By: Garald Balding M.D.   On: 03/26/2015 21:13    MDM  29 y.o. female with pain and bruising of the left foot s/p injury. Stable for d/c without neurovascular compromise. Ace wrap, ice, elevation and ibuprofen. Discussed with the patient and all questioned fully answered.   Final diagnoses:  Contusion of left foot, initial encounter       [facility], NP 03/26/15 2125  Francine Graven, DO 03/29/15 1756

## 2015-04-04 ENCOUNTER — Other Ambulatory Visit: Payer: Medicaid Other | Admitting: Adult Health

## 2015-06-17 ENCOUNTER — Encounter: Payer: Self-pay | Admitting: Family Medicine

## 2015-06-17 ENCOUNTER — Ambulatory Visit (INDEPENDENT_AMBULATORY_CARE_PROVIDER_SITE_OTHER): Payer: Medicaid Other | Admitting: Family Medicine

## 2015-06-17 VITALS — BP 115/69 | HR 81 | Temp 100.2°F | Ht 62.5 in | Wt 122.4 lb

## 2015-06-17 DIAGNOSIS — J019 Acute sinusitis, unspecified: Secondary | ICD-10-CM

## 2015-06-17 DIAGNOSIS — Z720 Tobacco use: Secondary | ICD-10-CM | POA: Diagnosis not present

## 2015-06-17 DIAGNOSIS — Z716 Tobacco abuse counseling: Secondary | ICD-10-CM

## 2015-06-17 DIAGNOSIS — Z9889 Other specified postprocedural states: Secondary | ICD-10-CM | POA: Diagnosis not present

## 2015-06-17 MED ORDER — BUPROPION HCL ER (SR) 150 MG PO TB12: 150.0000 mg | ORAL_TABLET | Freq: Two times a day (BID) | ORAL | Status: DC

## 2015-06-17 MED ORDER — FLUTICASONE PROPIONATE 50 MCG/ACT NA SUSP: 1.0000 | Freq: Two times a day (BID) | NASAL | Status: DC | PRN

## 2015-06-17 MED ORDER — AZITHROMYCIN 250 MG PO TABS: ORAL_TABLET | ORAL | Status: DC

## 2015-06-17 NOTE — Progress Notes (Signed)
BP 115/69 mmHg  Pulse 81  Temp(Src) 100.2 F (37.9 C) (Oral)  Ht 5' 2.5" (1.588 m)  Wt 122 lb 6.4 oz (55.52 kg)  BMI 22.02 kg/m2  LMP 05/12/2015 (Approximate)   Subjective:    Patient ID: Jaime Flores, female    DOB: [birth year], 29 y.o.   MRN: [id number]  HPI: Jaime Flores is a 29 y.o. female presenting on 06/17/2015 for Ear Pain and Sinusitis   HPI Sinus pressure and ear pain Patient presents today with a 5 day history of sinus pressure and ear pain and popping. She also has postnasal drainage and nasal congestion as well. She denies fevers and chills. Her mother was sick prior to her and came and had to get a Z-Pak. She is a smoker and has still been smoking but does want to quit.  Gynecological referral Patient has always gone to see Dr. Glo Herring for her gynecological exams and routine physicals would like a referral back to see them.  Relevant past medical, surgical, family and social history reviewed and updated as indicated. Interim medical history since our last visit reviewed. Allergies and medications reviewed and updated.  Review of Systems  Constitutional: Negative for fever and chills.  HENT: Positive for congestion, postnasal drip, rhinorrhea, sinus pressure and sore throat. Negative for ear discharge, ear pain and sneezing.   Eyes: Negative for pain, redness and visual disturbance.  Respiratory: Positive for cough. Negative for chest tightness, shortness of breath and wheezing.   Cardiovascular: Negative for chest pain and leg swelling.  Genitourinary: Negative for dysuria and difficulty urinating.  Musculoskeletal: Negative for back pain and gait problem.  Skin: Negative for rash.  Neurological: Negative for light-headedness and headaches.  Psychiatric/Behavioral: Negative for behavioral problems and agitation.  All other systems reviewed and are negative.   Per HPI unless specifically indicated above     Medication List       This list is  accurate as of: 06/17/15  5:40 PM.  Always use your most recent med list.               azithromycin 250 MG tablet  Commonly known as:  ZITHROMAX  Take 2 the first day and then one each day after.     buPROPion 150 MG 12 hr tablet  Commonly known as:  WELLBUTRIN SR  Take 1 tablet (150 mg total) by mouth 2 (two) times daily. 150 mg once daily for 3 days; increase to 150 mg twice daily     dextromethorphan-guaiFENesin 30-600 MG 12hr tablet  Commonly known as:  MUCINEX DM  Take 1 tablet by mouth 2 (two) times daily.     fluticasone 50 MCG/ACT nasal spray  Commonly known as:  FLONASE  Place 1 spray into both nostrils 2 (two) times daily as needed for allergies or rhinitis.     guaiFENesin 600 MG 12 hr tablet  Commonly known as:  MUCINEX  Take by mouth 2 (two) times daily.     phenylephrine 10 MG Tabs tablet  Commonly known as:  SUDAFED PE  Take 10 mg by mouth every 4 (four) hours as needed.           Objective:    BP 115/69 mmHg  Pulse 81  Temp(Src) 100.2 F (37.9 C) (Oral)  Ht 5' 2.5" (1.588 m)  Wt 122 lb 6.4 oz (55.52 kg)  BMI 22.02 kg/m2  LMP 05/12/2015 (Approximate)  Wt Readings from Last 3 Encounters:  06/17/15 122 lb 6.4 oz (  55.52 kg)  03/26/15 121 lb (54.885 kg)  03/10/15 120 lb 8 oz (54.658 kg)    Physical Exam  Constitutional: She is oriented to person, place, and time. She appears well-developed and well-nourished. No distress.  HENT:  Right Ear: Tympanic membrane, external ear and ear canal normal.  Left Ear: Tympanic membrane, external ear and ear canal normal.  Nose: Mucosal edema and rhinorrhea present. No epistaxis. Right sinus exhibits maxillary sinus tenderness. Right sinus exhibits no frontal sinus tenderness. Left sinus exhibits maxillary sinus tenderness. Left sinus exhibits no frontal sinus tenderness.  Mouth/Throat: Uvula is midline and mucous membranes are normal. Posterior oropharyngeal edema and posterior oropharyngeal erythema present. No  oropharyngeal exudate or tonsillar abscesses.  Eyes: Conjunctivae and EOM are normal.  Neck: Neck supple. No thyromegaly present.  Cardiovascular: Normal rate, regular rhythm, normal heart sounds and intact distal pulses.   No murmur heard. Pulmonary/Chest: Effort normal and breath sounds normal. No respiratory distress. She has no wheezes.  Musculoskeletal: Normal range of motion. She exhibits no edema or tenderness.  Lymphadenopathy:    She has no cervical adenopathy.  Neurological: She is alert and oriented to person, place, and time. Coordination normal.  Skin: Skin is warm and dry. No rash noted. She is not diaphoretic.  Psychiatric: She has a normal mood and affect. Her behavior is normal.  Vitals reviewed.   Results for orders placed or performed in visit on 03/10/15  TSH  Result Value Ref Range   TSH 1.890 0.450 - 4.500 uIU/mL  Prolactin  Result Value Ref Range   Prolactin 9.7 4.8 - 23.3 ng/mL      Assessment & Plan:   Problem List Items Addressed This Visit    None    Visit Diagnoses    Encounter for smoking cessation counseling    -  Primary    Relevant Medications    buPROPion (WELLBUTRIN SR) 150 MG 12 hr tablet    Acute rhinosinusitis        Relevant Medications    phenylephrine (SUDAFED PE) 10 MG TABS tablet    dextromethorphan-guaiFENesin (MUCINEX DM) 30-600 MG 12hr tablet    guaiFENesin (MUCINEX) 600 MG 12 hr tablet    azithromycin (ZITHROMAX) 250 MG tablet    fluticasone (FLONASE) 50 MCG/ACT nasal spray    History of gynecological procedure        Relevant Orders    Ambulatory referral to Obstetrics / Gynecology        Follow up plan: Return if symptoms worsen or fail to improve.  Counseling provided for all of the vaccine components Orders Placed This Encounter  Procedures  . Ambulatory referral to Obstetrics / Gynecology    Caryl Pina, MD [facility] 06/17/2015, 5:40 PM

## 2015-07-03 ENCOUNTER — Encounter: Payer: Self-pay | Admitting: Adult Health

## 2015-07-05 ENCOUNTER — Encounter: Payer: Self-pay | Admitting: Adult Health

## 2015-07-05 ENCOUNTER — Other Ambulatory Visit (HOSPITAL_COMMUNITY)
Admission: RE | Admit: 2015-07-05 | Discharge: 2015-07-05 | Disposition: A | Discharge status: Home / Self Care | Payer: Medicaid Other | Source: Ambulatory Visit | Attending: Adult Health | Admitting: Adult Health

## 2015-07-05 ENCOUNTER — Ambulatory Visit (INDEPENDENT_AMBULATORY_CARE_PROVIDER_SITE_OTHER): Payer: Medicaid Other | Admitting: Adult Health

## 2015-07-05 VITALS — BP 108/56 | HR 64 | Ht 62.5 in | Wt 119.0 lb

## 2015-07-05 DIAGNOSIS — Z7689 Persons encountering health services in other specified circumstances: Secondary | ICD-10-CM

## 2015-07-05 DIAGNOSIS — Z113 Encounter for screening for infections with a predominantly sexual mode of transmission: Secondary | ICD-10-CM

## 2015-07-05 DIAGNOSIS — N926 Irregular menstruation, unspecified: Secondary | ICD-10-CM | POA: Insufficient documentation

## 2015-07-05 DIAGNOSIS — Z01419 Encounter for gynecological examination (general) (routine) without abnormal findings: Secondary | ICD-10-CM | POA: Diagnosis not present

## 2015-07-05 DIAGNOSIS — Z3202 Encounter for pregnancy test, result negative: Secondary | ICD-10-CM

## 2015-07-05 DIAGNOSIS — Z1151 Encounter for screening for human papillomavirus (HPV): Secondary | ICD-10-CM | POA: Insufficient documentation

## 2015-07-05 DIAGNOSIS — Z124 Encounter for screening for malignant neoplasm of cervix: Secondary | ICD-10-CM

## 2015-07-05 DIAGNOSIS — Z0000 Encounter for general adult medical examination without abnormal findings: Secondary | ICD-10-CM | POA: Diagnosis not present

## 2015-07-05 HISTORY — DX: Irregular menstruation, unspecified: N92.6

## 2015-07-05 HISTORY — DX: Persons encountering health services in other specified circumstances: Z76.89

## 2015-07-05 LAB — POCT URINE PREGNANCY: PREG TEST UR: NEGATIVE

## 2015-07-05 MED ORDER — NORETHIN-ETH ESTRAD-FE BIPHAS 1 MG-10 MCG / 10 MCG PO TABS: 1.0000 | ORAL_TABLET | Freq: Every day | ORAL | Status: DC

## 2015-07-05 NOTE — Patient Instructions (Signed)
Start lo loestrin Sunday Follow up in 3 months Physical in  1year

## 2015-07-05 NOTE — Progress Notes (Signed)
Patient ID: Jaime Flores, female   DOB: [birth year], 29 y.o.   MRN: [id number] History of Present Illness: Jaime Flores is a 29 year old white female, in for well woman gyn exam, and pap.She complains of irregular periods, will skip 2-3 then will be heavier and last longer. She requests STD screening too.  Current Medications, Allergies, Past Medical History, Past Surgical History, Family History and Social History were reviewed in [employer] record.     Review of Systems: Patient denies any headaches, hearing loss, fatigue, blurred vision, shortness of breath, chest pain, abdominal pain, problems with bowel movements, urination, or intercourse. No joint pain or mood swings.See HPI for positives.    Physical Exam:BP 108/56 mmHg  Pulse 64  Ht 5' 2.5" (1.588 m)  Wt 119 lb (53.978 kg)  BMI 21.41 kg/m2  LMP 06/23/2015 UPT negative. General:  Well developed, well nourished, no acute distress Skin:  Warm and dry Neck:  Midline trachea, normal thyroid, good ROM, no lymphadenopathy Lungs; Clear to auscultation bilaterally Breast:  No dominant palpable mass, retraction, or nipple discharge Cardiovascular: Regular rate and rhythm Abdomen:  Soft, non tender, no hepatosplenomegaly Pelvic:  External genitalia is normal in appearance, no lesions.  The vagina is normal in appearance, with dark period like blood. Urethra has no lesions or masses. The cervix is bulbous.Pap with GC/CHL performed.  Uterus is felt to be normal size, shape, and contour.  No adnexal masses or tenderness noted.Bladder is non tender, no masses felt. Extremities/musculoskeletal:  No swelling or varicosities noted, no clubbing or cyanosis Psych:  No mood changes, alert and cooperative,seems happy Discussed trying OCs to regulate cycle and she agrees.  Impression: Well woman gyn exam and pap Irregular periods STD screening Period management     Plan: Check CBC,CMP,TSH and lipids,HIV,RPR and HSV 2 Rx  lo loestrin take 1 daily, dips 3 packs with 4 refills, start Sunday Follow up in 3 months Physical in 1 year

## 2015-07-06 ENCOUNTER — Telehealth: Payer: Self-pay | Admitting: Adult Health

## 2015-07-06 LAB — COMPREHENSIVE METABOLIC PANEL
ALT: 32 IU/L (ref 0–32)
AST: 21 IU/L (ref 0–40)
Albumin/Globulin Ratio: 2.2 (ref 1.1–2.5)
Albumin: 5 g/dL (ref 3.5–5.5)
Alkaline Phosphatase: 97 IU/L (ref 39–117)
BILIRUBIN TOTAL: 0.6 mg/dL (ref 0.0–1.2)
BUN/Creatinine Ratio: 7 — ABNORMAL LOW (ref 8–20)
BUN: 5 mg/dL — ABNORMAL LOW (ref 6–20)
CHLORIDE: 99 mmol/L (ref 96–106)
CO2: 27 mmol/L (ref 18–29)
Calcium: 9.8 mg/dL (ref 8.7–10.2)
Creatinine, Ser: 0.7 mg/dL (ref 0.57–1.00)
GFR calc Af Amer: 137 mL/min/{1.73_m2} (ref >59)
GFR, EST NON AFRICAN AMERICAN: 119 mL/min/{1.73_m2} (ref >59)
GLOBULIN, TOTAL: 2.3 g/dL (ref 1.5–4.5)
Glucose: 48 mg/dL — ABNORMAL LOW (ref 65–99)
Potassium: 3.7 mmol/L (ref 3.5–5.2)
SODIUM: 143 mmol/L (ref 134–144)
Total Protein: 7.3 g/dL (ref 6.0–8.5)

## 2015-07-06 LAB — CBC
Hematocrit: 44.8 % (ref 34.0–46.6)
Hemoglobin: 14.2 g/dL (ref 11.1–15.9)
MCH: 29.8 pg (ref 26.6–33.0)
MCHC: 31.7 g/dL (ref 31.5–35.7)
MCV: 94 fL (ref 79–97)
PLATELETS: 288 10*3/uL (ref 150–379)
RBC: 4.77 x10E6/uL (ref 3.77–5.28)
RDW: 13.2 % (ref 12.3–15.4)
WBC: 9.1 10*3/uL (ref 3.4–10.8)

## 2015-07-06 LAB — HIV ANTIBODY (ROUTINE TESTING W REFLEX): HIV SCREEN 4TH GENERATION: NONREACTIVE

## 2015-07-06 LAB — TSH: TSH: 1.67 u[IU]/mL (ref 0.450–4.500)

## 2015-07-06 LAB — LIPID PANEL
CHOL/HDL RATIO: 4.5 ratio — AB (ref 0.0–4.4)
Cholesterol, Total: 192 mg/dL (ref 100–199)
HDL: 43 mg/dL (ref >39)
LDL CALC: 136 mg/dL — AB (ref 0–99)
Triglycerides: 65 mg/dL (ref 0–149)
VLDL Cholesterol Cal: 13 mg/dL (ref 5–40)

## 2015-07-06 LAB — HSV 2 ANTIBODY, IGG: HSV 2 Glycoprotein G Ab, IgG: <0.91 index (ref 0.00–0.90)

## 2015-07-06 LAB — RPR: RPR Ser Ql: NONREACTIVE

## 2015-07-06 LAB — CYTOLOGY - PAP

## 2015-07-06 NOTE — Telephone Encounter (Signed)
Pt aware of labs, watch [employer]

## 2015-07-12 ENCOUNTER — Telehealth: Payer: Self-pay | Admitting: *Deleted

## 2015-07-12 NOTE — Telephone Encounter (Signed)
Pt aware to take both pills today.

## 2015-07-25 ENCOUNTER — Encounter: Payer: Self-pay | Admitting: Adult Health

## 2015-07-26 ENCOUNTER — Telehealth: Payer: Self-pay | Admitting: Adult Health

## 2015-07-26 NOTE — Telephone Encounter (Signed)
Spoke with pt and she had sent a message to [place] through my chart. Pt has appt here on Thursday to see JAG per JAG. [place]

## 2015-07-28 ENCOUNTER — Encounter: Payer: Self-pay | Admitting: Adult Health

## 2015-07-28 ENCOUNTER — Ambulatory Visit (INDEPENDENT_AMBULATORY_CARE_PROVIDER_SITE_OTHER): Payer: Medicaid Other | Admitting: Adult Health

## 2015-07-28 VITALS — BP 98/70 | HR 52 | Ht 62.5 in | Wt 118.5 lb

## 2015-07-28 DIAGNOSIS — Z3202 Encounter for pregnancy test, result negative: Secondary | ICD-10-CM | POA: Diagnosis not present

## 2015-07-28 DIAGNOSIS — N921 Excessive and frequent menstruation with irregular cycle: Secondary | ICD-10-CM | POA: Diagnosis not present

## 2015-07-28 HISTORY — DX: Excessive and frequent menstruation with irregular cycle: N92.1

## 2015-07-28 LAB — POCT URINE PREGNANCY: Preg Test, Ur: NEGATIVE

## 2015-07-28 NOTE — Progress Notes (Signed)
Subjective:     Patient ID: Jaime Flores, female   DOB: 1985-07-30, 30 y.o.   MRN: [id number]  HPI Candle is a 30 year old white female, in complaining of vaginal bleeding, no pain, heavier than usual, but has missed some OCs and made them up.   Review of Systems Patient denies any headaches, hearing loss, fatigue, blurred vision, shortness of breath, chest pain, abdominal pain, problems with bowel movements, urination, or intercourse. No joint pain or mood swings.See HPI for positives. Reviewed past medical,surgical, social and family history. Reviewed medications and allergies.     Objective:   Physical Exam BP 98/70 mmHg  Pulse 52  Ht 5' 2.5" (1.588 m)  Wt 118 lb 8 oz (53.751 kg)  BMI 21.32 kg/m2  LMP 07/25/2015 UPT negative, Skin warm and dry.Pelvic: external genitalia is normal in appearance no lesions, vagina: period like blood, no odor,urethra has no lesions or masses noted, cervix:smooth and bulbous, uterus: normal size, shape and contour, non tender, no masses felt, adnexa: no masses or tenderness noted. Bladder is non tender and no masses felt.     Assessment:       Breakthrough bleeding on the pill  Plan:     GC/CHL sent on urine Continue lo loestrin,take daily Follow up as scheduled

## 2015-07-28 NOTE — Patient Instructions (Signed)
Continue the pills  Follow up as scheduled

## 2015-07-29 LAB — GC/CHLAMYDIA PROBE AMP
Chlamydia trachomatis, NAA: NEGATIVE
Neisseria gonorrhoeae by PCR: NEGATIVE

## 2015-08-17 ENCOUNTER — Telehealth: Payer: Self-pay | Admitting: Adult Health

## 2015-08-17 NOTE — Telephone Encounter (Signed)
Spoke with pt. Pt started period again. This is the 2nd period this month. She is on Lo Loestrin; started it in December. What do you advise? Pt states if you need to change birth control, can you do it without her coming in? She don't have money for a copay. Thanks!! [place]

## 2015-08-17 NOTE — Telephone Encounter (Signed)
Spoke with pt letting her know to continue taking current pill for at least 3 months, at the same time every day. Pt voiced understanding. [place]

## 2015-09-30 ENCOUNTER — Ambulatory Visit (INDEPENDENT_AMBULATORY_CARE_PROVIDER_SITE_OTHER): Payer: Medicaid Other | Admitting: Nurse Practitioner

## 2015-09-30 ENCOUNTER — Encounter: Payer: Self-pay | Admitting: Nurse Practitioner

## 2015-09-30 VITALS — BP 105/52 | HR 70 | Temp 97.2°F | Ht 62.0 in | Wt 119.0 lb

## 2015-09-30 DIAGNOSIS — J302 Other seasonal allergic rhinitis: Secondary | ICD-10-CM

## 2015-09-30 MED ORDER — FLUTICASONE PROPIONATE 50 MCG/ACT NA SUSP: 2.0000 | Freq: Every day | NASAL | Status: DC

## 2015-09-30 NOTE — Progress Notes (Signed)
  Subjective:     Jaime Flores is a 30 y.o. female here for evaluation of a cough. Onset of symptoms was 5 days ago. Symptoms have been unchanged since that time. The cough is barky, dry and nocturnal and is aggravated by nothing. Associated symptoms include: change in voice and sputum production. Patient does not have a history of asthma. Patient does not have a history of environmental allergens. Patient has not traveled recently. Patient does not have a history of smoking. Patient has not had a previous chest x-ray. Patient has not had a PPD done.  The following portions of the patient's history were reviewed and updated as appropriate: allergies, current medications, past family history, past medical history, past social history, past surgical history and problem list.  Review of Systems Pertinent items are noted in HPI.    Objective:     BP 105/52 mmHg  Pulse 70  Temp(Src) 97.2 F (36.2 C) (Oral)  Ht 5\' 2"  (1.575 m)  Wt 119 lb (53.978 kg)  BMI 21.76 kg/m2 General appearance: alert and cooperative Eyes: conjunctivae/corneas clear. PERRL, EOM's intact. Fundi benign. Ears: normal TM's and external ear canals both ears Nose: clear discharge, moderate congestion turbinates pale bil Throat: lips, mucosa, and tongue normal; teeth and gums normal Neck: no adenopathy, no carotid bruit, no JVD, supple, symmetrical, trachea midline and thyroid not enlarged, symmetric, no tenderness/mass/nodules Lungs: clear to auscultation bilaterally Heart: regular rate and rhythm, S1, S2 normal, no murmur, click, rub or gallop    Assessment:    Allergic Rhinitis    Plan:   1. Take meds as prescribed 2. Use a cool mist humidifier especially during the winter months and when heat has been humid. 3. Use saline nose sprays frequently 4. Saline irrigations of the nose can be very helpful if done frequently.  * 4X daily for 1 week*  * Use of a nettie pot can be helpful with this. Follow directions  with this* 5. Drink plenty of fluids 6. Keep thermostat turn down low 7.For any cough or congestion  Use plain Mucinex- regular strength or max strength is fine   * Children- consult with Pharmacist for dosing 8. For fever or aces or pains- take tylenol or ibuprofen appropriate for age and weight.  * for fevers greater than 101 orally you may alternate ibuprofen and tylenol every  3 hours.   flonase OTC daily RTO prn  Mary-Margaret [name], FNP

## 2015-09-30 NOTE — Patient Instructions (Signed)
Allergic Rhinitis Allergic rhinitis is when the mucous membranes in the nose respond to allergens. Allergens are particles in the air that cause your body to have an allergic reaction. This causes you to release allergic antibodies. Through a chain of events, these eventually cause you to release histamine into the blood stream. Although meant to protect the body, it is this release of histamine that causes your discomfort, such as frequent sneezing, congestion, and an itchy, runny nose.  CAUSES Seasonal allergic rhinitis (hay fever) is caused by pollen allergens that may come from grasses, trees, and weeds. Year-round allergic rhinitis (perennial allergic rhinitis) is caused by allergens such as house dust mites, pet dander, and mold spores. SYMPTOMS  Nasal stuffiness (congestion).  Itchy, runny nose with sneezing and tearing of the eyes. DIAGNOSIS Your health care provider can help you determine the allergen or allergens that trigger your symptoms. If you and your health care provider are unable to determine the allergen, skin or blood testing may be used. Your health care provider will diagnose your condition after taking your health history and performing a physical exam. Your health care provider may assess you for other related conditions, such as asthma, pink eye, or an ear infection. TREATMENT Allergic rhinitis does not have a cure, but it can be controlled by:  Medicines that block allergy symptoms. These may include allergy shots, nasal sprays, and oral antihistamines.  Avoiding the allergen. Hay fever may often be treated with antihistamines in pill or nasal spray forms. Antihistamines block the effects of histamine. There are over-the-counter medicines that may help with nasal congestion and swelling around the eyes. Check with your health care provider before taking or giving this medicine. If avoiding the allergen or the medicine prescribed do not work, there are many new medicines  your health care provider can prescribe. Stronger medicine may be used if initial measures are ineffective. Desensitizing injections can be used if medicine and avoidance does not work. Desensitization is when a patient is given ongoing shots until the body becomes less sensitive to the allergen. Make sure you follow up with your health care provider if problems continue. HOME CARE INSTRUCTIONS It is not possible to completely avoid allergens, but you can reduce your symptoms by taking steps to limit your exposure to them. It helps to know exactly what you are allergic to so that you can avoid your specific triggers. SEEK MEDICAL CARE IF:  You have a fever.  You develop a cough that does not stop easily (persistent).  You have shortness of breath.  You start wheezing.  Symptoms interfere with normal daily activities.   This information is not intended to replace advice given to you by your health care provider. Make sure you discuss any questions you have with your health care provider.   Document Released: 04/03/2001 Document Revised: 07/30/2014 Document Reviewed: 03/16/2013 Elsevier Interactive Patient Education 2016 Elsevier Inc.  

## 2015-10-03 ENCOUNTER — Ambulatory Visit: Payer: Medicaid Other | Admitting: Adult Health

## 2015-10-05 ENCOUNTER — Ambulatory Visit: Payer: Medicaid Other | Admitting: Adult Health

## 2015-10-25 ENCOUNTER — Ambulatory Visit (INDEPENDENT_AMBULATORY_CARE_PROVIDER_SITE_OTHER): Payer: Medicaid Other | Admitting: Family Medicine

## 2015-10-25 ENCOUNTER — Encounter: Payer: Self-pay | Admitting: Family Medicine

## 2015-10-25 DIAGNOSIS — R05 Cough: Secondary | ICD-10-CM

## 2015-10-25 DIAGNOSIS — B349 Viral infection, unspecified: Secondary | ICD-10-CM

## 2015-10-25 DIAGNOSIS — R6883 Chills (without fever): Secondary | ICD-10-CM

## 2015-10-25 DIAGNOSIS — R52 Pain, unspecified: Secondary | ICD-10-CM

## 2015-10-25 DIAGNOSIS — R112 Nausea with vomiting, unspecified: Secondary | ICD-10-CM | POA: Diagnosis not present

## 2015-10-25 DIAGNOSIS — J988 Other specified respiratory disorders: Secondary | ICD-10-CM

## 2015-10-25 DIAGNOSIS — B9789 Other viral agents as the cause of diseases classified elsewhere: Secondary | ICD-10-CM

## 2015-10-25 DIAGNOSIS — R059 Cough, unspecified: Secondary | ICD-10-CM

## 2015-10-25 LAB — VERITOR FLU A/B WAIVED
Influenza A: NEGATIVE
Influenza B: NEGATIVE

## 2015-10-25 NOTE — Patient Instructions (Signed)
Great to see you!  It looks like you have a viral infection, not the flu, and should start to feel better in the next 3 days.   Viral Infections A viral infection can be caused by different types of viruses.Most viral infections are not serious and resolve on their own. However, some infections may cause severe symptoms and may lead to further complications. SYMPTOMS Viruses can frequently cause:  Minor sore throat.  Aches and pains.  Headaches.  Runny nose.  Different types of rashes.  Watery eyes.  Tiredness.  Cough.  Loss of appetite.  Gastrointestinal infections, resulting in nausea, vomiting, and diarrhea. These symptoms do not respond to antibiotics because the infection is not caused by bacteria. However, you might catch a bacterial infection following the viral infection. This is sometimes called a "superinfection." Symptoms of such a bacterial infection may include:  Worsening sore throat with pus and difficulty swallowing.  Swollen neck glands.  Chills and a high or persistent fever.  Severe headache.  Tenderness over the sinuses.  Persistent overall ill feeling (malaise), muscle aches, and tiredness (fatigue).  Persistent cough.  Yellow, green, or brown mucus production with coughing. HOME CARE INSTRUCTIONS   Only take over-the-counter or prescription medicines for pain, discomfort, diarrhea, or fever as directed by your caregiver.  Drink enough water and fluids to keep your urine clear or pale yellow. Sports drinks can provide valuable electrolytes, sugars, and hydration.  Get plenty of rest and maintain proper nutrition. Soups and broths with crackers or rice are fine. SEEK IMMEDIATE MEDICAL CARE IF:   You have severe headaches, shortness of breath, chest pain, neck pain, or an unusual rash.  You have uncontrolled vomiting, diarrhea, or you are unable to keep down fluids.  You or your child has an oral temperature above 102 F (38.9 C), not  controlled by medicine.  Your baby is older than 3 months with a rectal temperature of 102 F (38.9 C) or higher.  Your baby is 19 months old or younger with a rectal temperature of 100.4 F (38 C) or higher. MAKE SURE YOU:   Understand these instructions.  Will watch your condition.  Will get help right away if you are not doing well or get worse.   This information is not intended to replace advice given to you by your health care provider. Make sure you discuss any questions you have with your health care provider.   Document Released: 04/18/2005 Document Revised: 10/01/2011 Document Reviewed: 12/15/2014 Elsevier Interactive Patient Education [employer].

## 2015-10-25 NOTE — Progress Notes (Signed)
   HPI  Patient presents today here with flulike illness.  Patient explains that over the last 2 days she's had objective fever,T max 99.6, cough, body aches in her shoulders, back, and legs, and chills. She's had emesis X 1 with no additional   Daughter had a similar illness last week, she was tested and was flu negative.  She also has malaise.  She has no shortness of breath, chest pain, or difficulty tolerating foods or fluids.  She does have nasal irritation  PMH: Smoking status noted ROS: Per HPI  Objective: BP 101/60 mmHg  Pulse 70  Temp(Src) 97.6 F (36.4 C) (Oral)  Ht 5\' 2"  (1.575 m)  Wt 122 lb 3.2 oz (55.43 kg)  BMI 22.35 kg/m2  LMP 08/25/2015 Gen: NAD, alert, cooperative with exam HEENT: NCAT CV: RRR, good S1/S2, no murmur Resp: CTABL, no wheezes, non-labored Abd: SNTND, BS present, no guarding or organomegaly Ext: No edema, warm Neuro: Alert and oriented, No gross deficits  Assessment and plan:  # viral resp illness Flu negative but flu-like Discussed supportive care and good handwashing RTC with any concerns or worsening symptoms.     Orders Placed This Encounter  Procedures  . Veritor Flu A/B Waived    Order Specific Question:  Source    Answer:  nose     Laroy Apple, MD [facility] 10/25/2015, 10:11 AM

## 2015-11-10 ENCOUNTER — Encounter: Payer: Self-pay | Admitting: Nurse Practitioner

## 2015-11-10 ENCOUNTER — Ambulatory Visit (INDEPENDENT_AMBULATORY_CARE_PROVIDER_SITE_OTHER): Payer: Medicaid Other | Admitting: Nurse Practitioner

## 2015-11-10 VITALS — BP 105/70 | HR 71 | Temp 98.1°F | Ht 62.0 in | Wt 122.0 lb

## 2015-11-10 DIAGNOSIS — J309 Allergic rhinitis, unspecified: Secondary | ICD-10-CM

## 2015-11-10 MED ORDER — CETIRIZINE HCL 10 MG PO TABS: 10.0000 mg | ORAL_TABLET | Freq: Every day | ORAL | Status: DC

## 2015-11-10 NOTE — Patient Instructions (Signed)
Allergic Rhinitis Allergic rhinitis is when the mucous membranes in the nose respond to allergens. Allergens are particles in the air that cause your body to have an allergic reaction. This causes you to release allergic antibodies. Through a chain of events, these eventually cause you to release histamine into the blood stream. Although meant to protect the body, it is this release of histamine that causes your discomfort, such as frequent sneezing, congestion, and an itchy, runny nose.  CAUSES Seasonal allergic rhinitis (hay fever) is caused by pollen allergens that may come from grasses, trees, and weeds. Year-round allergic rhinitis (perennial allergic rhinitis) is caused by allergens such as house dust mites, pet dander, and mold spores. SYMPTOMS  Nasal stuffiness (congestion).  Itchy, runny nose with sneezing and tearing of the eyes. DIAGNOSIS Your health care provider can help you determine the allergen or allergens that trigger your symptoms. If you and your health care provider are unable to determine the allergen, skin or blood testing may be used. Your health care provider will diagnose your condition after taking your health history and performing a physical exam. Your health care provider may assess you for other related conditions, such as asthma, pink eye, or an ear infection. TREATMENT Allergic rhinitis does not have a cure, but it can be controlled by:  Medicines that block allergy symptoms. These may include allergy shots, nasal sprays, and oral antihistamines.  Avoiding the allergen. Hay fever may often be treated with antihistamines in pill or nasal spray forms. Antihistamines block the effects of histamine. There are over-the-counter medicines that may help with nasal congestion and swelling around the eyes. Check with your health care provider before taking or giving this medicine. If avoiding the allergen or the medicine prescribed do not work, there are many new medicines  your health care provider can prescribe. Stronger medicine may be used if initial measures are ineffective. Desensitizing injections can be used if medicine and avoidance does not work. Desensitization is when a patient is given ongoing shots until the body becomes less sensitive to the allergen. Make sure you follow up with your health care provider if problems continue. HOME CARE INSTRUCTIONS It is not possible to completely avoid allergens, but you can reduce your symptoms by taking steps to limit your exposure to them. It helps to know exactly what you are allergic to so that you can avoid your specific triggers. SEEK MEDICAL CARE IF:  You have a fever.  You develop a cough that does not stop easily (persistent).  You have shortness of breath.  You start wheezing.  Symptoms interfere with normal daily activities.   This information is not intended to replace advice given to you by your health care provider. Make sure you discuss any questions you have with your health care provider.   Document Released: 04/03/2001 Document Revised: 07/30/2014 Document Reviewed: 03/16/2013 Elsevier Interactive Patient Education 2016 Elsevier Inc.  

## 2015-11-10 NOTE — Progress Notes (Signed)
  Subjective:     Jaime Flores is a 30 y.o. female who presents for evaluation of sinus pain. Symptoms include: congestion, headaches, sinus pressure and sniffing. Onset of symptoms was 3 days ago. Symptoms have been unchanged since that time. Past history is significant for no history of pneumonia or bronchitis. Patient is a non-smoker.  The following portions of the patient's history were reviewed and updated as appropriate: allergies, current medications, past family history, past medical history, past social history, past surgical history and problem list.  Review of Systems Pertinent items are noted in HPI.   Objective:    BP 105/70 mmHg  Pulse 71  Temp(Src) 98.1 F (36.7 C) (Oral)  Ht 5\' 2"  (1.575 m)  Wt 122 lb (55.339 kg)  BMI 22.31 kg/m2  LMP 08/25/2015 General appearance: alert and cooperative Eyes: conjunctivae/corneas clear. PERRL, EOM's intact. Fundi benign. Ears: normal TM's and external ear canals both ears Nose: clear discharge, moderate congestion, turbinates pale, no sinus tenderness Throat: lips, mucosa, and tongue normal; teeth and gums normal Lungs: clear to auscultation bilaterally Heart: regular rate and rhythm, S1, S2 normal, no murmur, click, rub or gallop    Assessment:    Acute allergic sinusitis.    Plan:    Nasal saline sprays. Neti pot recommended. Instructions given.    Meds ordered this encounter  Medications  . cetirizine (ZYRTEC) 10 MG tablet    Sig: Take 1 tablet (10 mg total) by mouth daily.    Dispense:  30 tablet    Refill:  11    Order Specific Question:  Supervising Provider    Answer:  Chipper Herb [[id number]]   [place], FNP

## 2016-01-04 ENCOUNTER — Ambulatory Visit (INDEPENDENT_AMBULATORY_CARE_PROVIDER_SITE_OTHER): Payer: Medicaid Other | Admitting: Family Medicine

## 2016-01-04 ENCOUNTER — Encounter: Payer: Self-pay | Admitting: Family Medicine

## 2016-01-04 ENCOUNTER — Ambulatory Visit (INDEPENDENT_AMBULATORY_CARE_PROVIDER_SITE_OTHER): Payer: Medicaid Other

## 2016-01-04 VITALS — BP 105/66 | HR 65 | Temp 97.4°F | Ht 62.0 in | Wt 118.4 lb

## 2016-01-04 DIAGNOSIS — M545 Low back pain, unspecified: Secondary | ICD-10-CM

## 2016-01-04 MED ORDER — CYCLOBENZAPRINE HCL 10 MG PO TABS: 10.0000 mg | ORAL_TABLET | Freq: Three times a day (TID) | ORAL | Status: DC | PRN

## 2016-01-04 NOTE — Progress Notes (Signed)
BP 105/66 mmHg  Pulse 65  Temp(Src) 97.4 F (36.3 C) (Oral)  Ht 5\' 2"  (1.575 m)  Wt 118 lb 6.4 oz (53.706 kg)  BMI 21.65 kg/m2   Subjective:    Patient ID: Jaime Flores, female    DOB: Nov 30, 1985, 30 y.o.   MRN: [id number]  HPI: Jaime Flores is a 30 y.o. female presenting on 01/04/2016 for Back Pain   HPI Low back pain Patient has low back pain over her tailbone region. She dove into a pond and didn't know how deep it was and rolled and thinks she may have hit a rock on her tailbone. She is having pain right in the middle down there. She denies any pain radiating down either leg currently. She denies any loss of bowel or loss of bladder or any numbness or weakness. She rates the pain is an 8 out of 10. She has tried Tylenol and it has helped a little bit.  Relevant past medical, surgical, family and social history reviewed and updated as indicated. Interim medical history since our last visit reviewed. Allergies and medications reviewed and updated.  Review of Systems  Constitutional: Negative for fever and chills.  HENT: Negative for congestion, ear discharge and ear pain.   Eyes: Negative for redness and visual disturbance.  Respiratory: Negative for chest tightness and shortness of breath.   Cardiovascular: Negative for chest pain and leg swelling.  Genitourinary: Negative for dysuria and difficulty urinating.  Musculoskeletal: Positive for back pain. Negative for gait problem.  Skin: Negative for rash.  Neurological: Negative for light-headedness and headaches.  Psychiatric/Behavioral: Negative for behavioral problems and agitation.  All other systems reviewed and are negative.   Per HPI unless specifically indicated above    Medication List       This list is accurate as of: 01/04/16  5:07 PM.  Always use your most recent med list.               cetirizine 10 MG tablet  Commonly known as:  ZYRTEC  Take 1 tablet (10 mg total) by mouth daily.     cyclobenzaprine 10 MG tablet  Commonly known as:  FLEXERIL  Take 1 tablet (10 mg total) by mouth 3 (three) times daily as needed for muscle spasms.     fluticasone 50 MCG/ACT nasal spray  Commonly known as:  FLONASE  Place 2 sprays into both nostrils daily.          Objective:    BP 105/66 mmHg  Pulse 65  Temp(Src) 97.4 F (36.3 C) (Oral)  Ht 5\' 2"  (1.575 m)  Wt 118 lb 6.4 oz (53.706 kg)  BMI 21.65 kg/m2  Wt Readings from Last 3 Encounters:  01/04/16 118 lb 6.4 oz (53.706 kg)  11/10/15 122 lb (55.339 kg)  10/25/15 122 lb 3.2 oz (55.43 kg)    Physical Exam  Constitutional: She is oriented to person, place, and time. She appears well-developed and well-nourished. No distress.  Eyes: Conjunctivae and EOM are normal. Pupils are equal, round, and reactive to light.  Neck: Neck supple. No thyromegaly present.  Cardiovascular: Normal rate, regular rhythm, normal heart sounds and intact distal pulses.   No murmur heard. Pulmonary/Chest: Effort normal and breath sounds normal. No respiratory distress. She has no wheezes.  Musculoskeletal: Normal range of motion. She exhibits tenderness. She exhibits no edema.       Lumbar back: She exhibits tenderness and bony tenderness. She exhibits no swelling, no deformity and no  laceration.  Lymphadenopathy:    She has no cervical adenopathy.  Neurological: She is alert and oriented to person, place, and time. Coordination normal.  Skin: Skin is warm and dry. No rash noted. She is not diaphoretic.  Psychiatric: She has a normal mood and affect. Her behavior is normal.  Nursing note and vitals reviewed.     Lumbar x-ray:  Patient shows no sign of fracture on lumbar x-ray. Await final read by radiologist.    Assessment & Plan:       Problem List Items Addressed This Visit    None    Visit Diagnoses    Midline low back pain without sciatica    -  Primary    Relevant Medications    cyclobenzaprine (FLEXERIL) 10 MG tablet    Other  Relevant Orders    DG Lumbar Spine 2-3 Views        Follow up plan: Return if symptoms worsen or fail to improve.  Counseling provided for all of the vaccine components Orders Placed This Encounter  Procedures  . DG Lumbar Spine 2-3 Views    Caryl Pina, MD [facility] 01/04/2016, 5:07 PM

## 2016-03-28 ENCOUNTER — Encounter: Payer: Self-pay | Admitting: Family Medicine

## 2016-03-28 ENCOUNTER — Ambulatory Visit: Payer: Medicaid Other | Admitting: Family Medicine

## 2016-03-28 ENCOUNTER — Ambulatory Visit (INDEPENDENT_AMBULATORY_CARE_PROVIDER_SITE_OTHER): Payer: Medicaid Other | Admitting: Family Medicine

## 2016-03-28 VITALS — BP 100/62 | HR 69 | Temp 97.6°F | Ht 62.0 in | Wt 115.5 lb

## 2016-03-28 DIAGNOSIS — J0100 Acute maxillary sinusitis, unspecified: Secondary | ICD-10-CM | POA: Diagnosis not present

## 2016-03-28 MED ORDER — AMOXICILLIN-POT CLAVULANATE 875-125 MG PO TABS: 1.0000 | ORAL_TABLET | Freq: Two times a day (BID) | ORAL | 0 refills | Status: DC

## 2016-03-28 MED ORDER — PSEUDOEPHEDRINE-GUAIFENESIN ER 120-1200 MG PO TB12: 1.0000 | ORAL_TABLET | Freq: Two times a day (BID) | ORAL | 0 refills | Status: DC

## 2016-03-28 NOTE — Progress Notes (Signed)
Subjective:  Patient ID: Jaime Flores, female    DOB: 1986/05/24  Age: 30 y.o. MRN: [id number]  CC: Sinusitis (pt here today c/o sinus pressure, runny nose, sneezing and coughing. )   HPI [name] presents for Symptoms include congestion, facial pain, nasal congestion, no  fever, non productive cough, post nasal drip and sinus pressure with no fever, chills, night sweats or weight loss. Onset of symptoms was a few days ago, gradually worsening since that time. Pt.is drinking moderate amounts of fluids.      History Jaime Flores has a past medical history of Amenorrhea (01/27/2013); Breast pain (03/10/2015); Encounter for menstrual regulation (07/05/2015); Irregular intermenstrual bleeding (07/28/2015); Irregular periods (07/05/2015); Screening for STD (sexually transmitted disease) (08/07/2013); UTI (urinary tract infection); and Vaginal discharge (06/10/2013).   She has a past surgical history that includes Cholecystectomy; Appendectomy; and Tubal ligation.   Her family history includes Autism in her brother and son; Cancer in her maternal aunt; Diabetes in her maternal aunt, maternal grandmother, and paternal grandmother; Heart failure in her maternal grandmother and paternal grandfather; Hypertension in her maternal aunt and paternal grandmother; Other in her mother and paternal uncle; Seizures in her father; Stroke in her father, maternal grandfather, and maternal grandmother.She reports that she has been smoking Cigarettes.  She has a 7.00 pack-year smoking history. She has never used smokeless tobacco. She reports that she does not drink alcohol or use drugs.    ROS Review of Systems  Constitutional: Negative for activity change, appetite change, chills and fever.  HENT: Positive for congestion, postnasal drip, rhinorrhea and sinus pressure. Negative for ear discharge, ear pain, hearing loss, nosebleeds, sneezing and trouble swallowing.   Respiratory: Negative for chest tightness  and shortness of breath.   Cardiovascular: Negative for chest pain and palpitations.  Skin: Negative for rash.    Objective:  BP 100/62   Pulse 69   Temp 97.6 F (36.4 C) (Oral)   Ht 5\' 2"  (1.575 m)   Wt 115 lb 8 oz (52.4 kg)   LMP 03/21/2016 (Approximate)   SpO2 98%   BMI 21.13 kg/m   BP Readings from Last 3 Encounters:  03/28/16 100/62  01/04/16 105/66  11/10/15 105/70    Wt Readings from Last 3 Encounters:  03/28/16 115 lb 8 oz (52.4 kg)  01/04/16 118 lb 6.4 oz (53.7 kg)  11/10/15 122 lb (55.3 kg)     Physical Exam  Constitutional: She appears well-developed and well-nourished.  HENT:  Head: Normocephalic and atraumatic.  Right Ear: Tympanic membrane and external ear normal. No decreased hearing is noted.  Left Ear: Tympanic membrane and external ear normal. No decreased hearing is noted.  Nose: Mucosal edema present. Right sinus exhibits no frontal sinus tenderness. Left sinus exhibits no frontal sinus tenderness.  Mouth/Throat: No oropharyngeal exudate or posterior oropharyngeal erythema.  Neck: No Brudzinski's sign noted.  Pulmonary/Chest: Breath sounds normal. No respiratory distress.  Lymphadenopathy:       Head (right side): No preauricular adenopathy present.       Head (left side): No preauricular adenopathy present.       Right cervical: No superficial cervical adenopathy present.      Left cervical: No superficial cervical adenopathy present.     Lab Results  Component Value Date   WBC 9.1 07/05/2015   HGB 13.1 02/02/2014   HCT 44.8 07/05/2015   PLT 288 07/05/2015   GLUCOSE 48 (L) 07/05/2015   CHOL 192 07/05/2015   TRIG 65 07/05/2015  HDL 43 07/05/2015   LDLCALC 136 (H) 07/05/2015   ALT 32 07/05/2015   AST 21 07/05/2015   NA 143 07/05/2015   K 3.7 07/05/2015   CL 99 07/05/2015   CREATININE 0.70 07/05/2015   BUN 5 (L) 07/05/2015   CO2 27 07/05/2015   TSH 1.670 07/05/2015    No results found.  Assessment & Plan:   Jaime Flores was seen  today for sinusitis.  Diagnoses and all orders for this visit:  Acute maxillary sinusitis, recurrence not specified  Other orders -     amoxicillin-clavulanate (AUGMENTIN) 875-125 MG tablet; Take 1 tablet by mouth 2 (two) times daily. Take all of this medication -     Pseudoephedrine-Guaifenesin [phone] MG TB12; Take 1 tablet by mouth 2 (two) times daily. For congestion      I have discontinued Ms. Holford's fluticasone, cetirizine, and cyclobenzaprine. I am also having her start on amoxicillin-clavulanate and Pseudoephedrine-Guaifenesin.  Meds ordered this encounter  Medications  . amoxicillin-clavulanate (AUGMENTIN) 875-125 MG tablet    Sig: Take 1 tablet by mouth 2 (two) times daily. Take all of this medication    Dispense:  20 tablet    Refill:  0  . Pseudoephedrine-Guaifenesin [phone] MG TB12    Sig: Take 1 tablet by mouth 2 (two) times daily. For congestion    Dispense:  20 each    Refill:  0     Follow-up: Return if symptoms worsen or fail to improve.  Claretta Fraise, M.D.

## 2016-06-29 ENCOUNTER — Ambulatory Visit (INDEPENDENT_AMBULATORY_CARE_PROVIDER_SITE_OTHER): Payer: Medicaid Other | Admitting: Adult Health

## 2016-06-29 ENCOUNTER — Encounter: Payer: Self-pay | Admitting: Adult Health

## 2016-06-29 VITALS — BP 106/52 | HR 74 | Ht 62.5 in | Wt 111.5 lb

## 2016-06-29 DIAGNOSIS — N760 Acute vaginitis: Secondary | ICD-10-CM

## 2016-06-29 DIAGNOSIS — B9689 Other specified bacterial agents as the cause of diseases classified elsewhere: Secondary | ICD-10-CM | POA: Diagnosis not present

## 2016-06-29 DIAGNOSIS — Z3202 Encounter for pregnancy test, result negative: Secondary | ICD-10-CM | POA: Insufficient documentation

## 2016-06-29 DIAGNOSIS — N898 Other specified noninflammatory disorders of vagina: Secondary | ICD-10-CM

## 2016-06-29 DIAGNOSIS — Z113 Encounter for screening for infections with a predominantly sexual mode of transmission: Secondary | ICD-10-CM | POA: Diagnosis not present

## 2016-06-29 DIAGNOSIS — N9089 Other specified noninflammatory disorders of vulva and perineum: Secondary | ICD-10-CM

## 2016-06-29 LAB — POCT WET PREP (WET MOUNT)
Clue Cells Wet Prep Whiff POC: NEGATIVE
WBC, Wet Prep HPF POC: POSITIVE

## 2016-06-29 LAB — POCT URINE PREGNANCY: Preg Test, Ur: NEGATIVE

## 2016-06-29 MED ORDER — METRONIDAZOLE 0.75 % VA GEL: 1.0000 | Freq: Two times a day (BID) | VAGINAL | 0 refills | Status: DC

## 2016-06-29 MED ORDER — VALACYCLOVIR HCL 1 G PO TABS: 1000.0000 mg | ORAL_TABLET | Freq: Two times a day (BID) | ORAL | 1 refills | Status: DC

## 2016-06-29 NOTE — Progress Notes (Signed)
Subjective:     Patient ID: Jaime Flores, female   DOB: May 22, 1986, 30 y.o.   MRN: [id number]  HPI Jaime Flores is a 30 year old white female in complaining of vaginal discharge(brown and creamy) with odor and irritation left labia.   Review of Systems +vaginal discharge with odor Labial irritation Reviewed past medical,surgical, social and family history. Reviewed medications and allergies.     Objective:   Physical Exam BP (!) 106/52 (BP Location: Left Arm, Patient Position: Sitting, Cuff Size: Normal)   Pulse 74   Ht 5' 2.5" (1.588 m)   Wt 111 lb 8 oz (50.6 kg)   LMP 05/23/2016 (Approximate)   BMI 20.07 kg/m  UPT negative.  PHQ 2 score 0. Skin warm and dry.Pelvic: external genitalia is normal in appearance, has linear crack left inner labia near introitus, and it is tender, vagina: white discharge without odor,urethra has no lesions or masses noted, cervix is bulbous, irritated and friable with Q tip,uterus: normal size, shape and contour, non tender, no masses felt, adnexa: no masses or tenderness noted. Bladder is non tender and no masses felt. Wet prep: + for clue cells and +WBCs. GC/CHL obtained.    HSV culture obtained.  Assessment:     1. Vaginal discharge   2. Vaginal odor   3. BV (bacterial vaginosis)   4. Labial irritation   5. Pregnancy examination or test, negative result   6. Screening for STD (sexually transmitted disease)       Plan:     Meds ordered this encounter  Medications  . valACYclovir (VALTREX) 1000 MG tablet    Sig: Take 1 tablet (1,000 mg total) by mouth 2 (two) times daily.    Dispense:  20 tablet    Refill:  1    Order Specific Question:   Supervising Provider    Answer:   Elonda Husky, LUTHER H [[id number]]  . metroNIDAZOLE (METROGEL) 0.75 % vaginal gel    Sig: Place 1 Applicatorful vaginally 2 (two) times daily.    Dispense:  70 g    Refill:  0    Order Specific Question:   Supervising Provider    Answer:   Florian Buff [[id number]]  GC/CHL sent HSV  culture sent Review handout on BV Follow up prn

## 2016-06-29 NOTE — Patient Instructions (Signed)
Bacterial Vaginosis Bacterial vaginosis is a vaginal infection that occurs when the normal balance of bacteria in the vagina is disrupted. It results from an overgrowth of certain bacteria. This is the most common vaginal infection among women ages 15-44. Because bacterial vaginosis increases your risk for STIs (sexually transmitted infections), getting treated can help reduce your risk for chlamydia, gonorrhea, herpes, and HIV (human immunodeficiency virus). Treatment is also important for preventing complications in pregnant women, because this condition can cause an early (premature) delivery. What are the causes? This condition is caused by an increase in harmful bacteria that are normally present in small amounts in the vagina. However, the reason that the condition develops is not fully understood. What increases the risk? The following factors may make you more likely to develop this condition:  Having a new sexual partner or multiple sexual partners.  Having unprotected sex.  Douching.  Having an intrauterine device (IUD).  Smoking.  Drug and alcohol abuse.  Taking certain antibiotic medicines.  Being pregnant.  You cannot get bacterial vaginosis from toilet seats, bedding, swimming pools, or contact with objects around you. What are the signs or symptoms? Symptoms of this condition include:  Grey or white vaginal discharge. The discharge can also be watery or foamy.  A fish-like odor with discharge, especially after sexual intercourse or during menstruation.  Itching in and around the vagina.  Burning or pain with urination.  Some women with bacterial vaginosis have no signs or symptoms. How is this diagnosed? This condition is diagnosed based on:  Your medical history.  A physical exam of the vagina.  Testing a sample of vaginal fluid under a microscope to look for a large amount of bad bacteria or abnormal cells. Your health care provider may use a cotton swab  or a small wooden spatula to collect the sample.  How is this treated? This condition is treated with antibiotics. These may be given as a pill, a vaginal cream, or a medicine that is put into the vagina (suppository). If the condition comes back after treatment, a second round of antibiotics may be needed. Follow these instructions at home: Medicines  Take over-the-counter and prescription medicines only as told by your health care provider.  Take or use your antibiotic as told by your health care provider. Do not stop taking or using the antibiotic even if you start to feel better. General instructions  If you have a female sexual partner, tell her that you have a vaginal infection. She should see her health care provider and be treated if she has symptoms. If you have a female sexual partner, he does not need treatment.  During treatment: ? Avoid sexual activity until you finish treatment. ? Do not douche. ? Avoid alcohol as directed by your health care provider. ? Avoid breastfeeding as directed by your health care provider.  Drink enough water and fluids to keep your urine clear or pale yellow.  Keep the area around your vagina and rectum clean. ? Wash the area daily with warm water. ? Wipe yourself from front to back after using the toilet.  Keep all follow-up visits as told by your health care provider. This is important. How is this prevented?  Do not douche.  Wash the outside of your vagina with warm water only.  Use protection when having sex. This includes latex condoms and dental dams.  Limit how many sexual partners you have. To help prevent bacterial vaginosis, it is best to have sex with just   one partner (monogamous).  Make sure you and your sexual partner are tested for STIs.  Wear cotton or cotton-lined underwear.  Avoid wearing tight pants and pantyhose, especially during summer.  Limit the amount of alcohol that you drink.  Do not use any products that  contain nicotine or tobacco, such as cigarettes and e-cigarettes. If you need help quitting, ask your health care provider.  Do not use illegal drugs. Where to find more information:  Centers for Disease Control and Prevention: www.cdc.gov/std  American Sexual Health Association (ASHA): www.ashastd.org  U.S. Department of Health and Human Services, Office on Women's Health: www.womenshealth.gov/ or https://www.womenshealth.gov/a-z-topics/bacterial-vaginosis Contact a health care provider if:  Your symptoms do not improve, even after treatment.  You have more discharge or pain when urinating.  You have a fever.  You have pain in your abdomen.  You have pain during sex.  You have vaginal bleeding between periods. Summary  Bacterial vaginosis is a vaginal infection that occurs when the normal balance of bacteria in the vagina is disrupted.  Because bacterial vaginosis increases your risk for STIs (sexually transmitted infections), getting treated can help reduce your risk for chlamydia, gonorrhea, herpes, and HIV (human immunodeficiency virus). Treatment is also important for preventing complications in pregnant women, because the condition can cause an early (premature) delivery.  This condition is treated with antibiotic medicines. These may be given as a pill, a vaginal cream, or a medicine that is put into the vagina (suppository). This information is not intended to replace advice given to you by your health care provider. Make sure you discuss any questions you have with your health care provider. Document Released: 07/09/2005 Document Revised: 03/24/2016 Document Reviewed: 03/24/2016 Elsevier Interactive Patient Education  2017 [employer]  

## 2016-07-01 ENCOUNTER — Encounter: Payer: Self-pay | Admitting: Adult Health

## 2016-07-02 ENCOUNTER — Encounter: Payer: Self-pay | Admitting: Adult Health

## 2016-07-02 LAB — HERPES SIMPLEX VIRUS CULTURE

## 2016-07-03 LAB — GC/CHLAMYDIA PROBE AMP
Chlamydia trachomatis, NAA: NEGATIVE
NEISSERIA GONORRHOEAE BY PCR: NEGATIVE

## 2016-07-06 ENCOUNTER — Ambulatory Visit: Payer: Medicaid Other | Admitting: Adult Health

## 2016-08-15 ENCOUNTER — Telehealth: Payer: Self-pay | Admitting: Family Medicine

## 2016-08-15 NOTE — Telephone Encounter (Signed)
Appointment made with [name] 1/25 at 8:25am.

## 2016-08-16 ENCOUNTER — Ambulatory Visit (INDEPENDENT_AMBULATORY_CARE_PROVIDER_SITE_OTHER): Payer: Medicaid Other | Admitting: Family Medicine

## 2016-08-16 ENCOUNTER — Encounter: Payer: Self-pay | Admitting: Family Medicine

## 2016-08-16 ENCOUNTER — Ambulatory Visit (INDEPENDENT_AMBULATORY_CARE_PROVIDER_SITE_OTHER): Payer: Medicaid Other

## 2016-08-16 VITALS — BP 109/64 | HR 71 | Temp 97.7°F | Ht 62.5 in | Wt 118.4 lb

## 2016-08-16 DIAGNOSIS — M779 Enthesopathy, unspecified: Secondary | ICD-10-CM

## 2016-08-16 NOTE — Progress Notes (Signed)
BP 109/64   Pulse 71   Temp 97.7 F (36.5 C) (Oral)   Ht 5' 2.5" (1.588 m)   Wt 118 lb 6.4 oz (53.7 kg)   LMP 08/12/2015   BMI 21.31 kg/m    Subjective:    Patient ID: Jaime Flores, female    DOB: 24-Nov-1985, 31 y.o.   MRN: [id number]  HPI: Jaime Flores is a 31 y.o. female presenting on 08/16/2016 for Foot Pain (right foot knot top started Monday as a soft spot spot now hard knot & hurts with boots , took 800 mg ibuprofen)   HPI Foot pain Patient has been having some pain and developed a knot on the top of her foot around her mid foot and it hurts especially with her boots. When she is putting on and off or bruits it rubs on that spot. She took ibuprofen 800 mg once and has not noticed if it is helping yet. She noticed this about 3 days ago and has never had this before. She denies any numbness or weakness or pain going anywhere else. She denies any difficulty walking but is more when something runs on the top part of her foot there. The pain when she gets it can sometimes be sharp but is usually still low-grade.  Relevant past medical, surgical, family and social history reviewed and updated as indicated. Interim medical history since our last visit reviewed. Allergies and medications reviewed and updated.  Review of Systems  Constitutional: Negative for chills and fever.  Respiratory: Negative for chest tightness and shortness of breath.   Cardiovascular: Negative for chest pain and leg swelling.  Genitourinary: Negative for difficulty urinating and dysuria.  Musculoskeletal: Positive for arthralgias. Negative for back pain and gait problem.  Skin: Negative for color change and rash.  Neurological: Negative for light-headedness and headaches.  Psychiatric/Behavioral: Negative for agitation and behavioral problems.  All other systems reviewed and are negative.   Per HPI unless specifically indicated above     Objective:    BP 109/64   Pulse 71   Temp 97.7 F  (36.5 C) (Oral)   Ht 5' 2.5" (1.588 m)   Wt 118 lb 6.4 oz (53.7 kg)   LMP 08/12/2015   BMI 21.31 kg/m   Wt Readings from Last 3 Encounters:  08/16/16 118 lb 6.4 oz (53.7 kg)  06/29/16 111 lb 8 oz (50.6 kg)  03/28/16 115 lb 8 oz (52.4 kg)    Physical Exam  Constitutional: She is oriented to person, place, and time. She appears well-developed and well-nourished. No distress.  Eyes: Conjunctivae are normal.  Cardiovascular: Normal rate, regular rhythm, normal heart sounds and intact distal pulses.   No murmur heard. Pulmonary/Chest: Effort normal and breath sounds normal. No respiratory distress. She has no wheezes. She has no rales.  Musculoskeletal: Normal range of motion. She exhibits no edema.       Right foot: There is tenderness. There is normal range of motion, no bony tenderness, normal capillary refill and no deformity.       Feet:  Neurological: She is alert and oriented to person, place, and time. Coordination normal.  Skin: Skin is warm and dry. No rash noted. She is not diaphoretic.  Psychiatric: She has a normal mood and affect. Her behavior is normal.  Nursing note and vitals reviewed.  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 Await final read by radiology.    Assessment & Plan:  Problem List Items Addressed This Visit    None    Visit Diagnoses    Tendinitis    -  Primary   Recommend high-dose anti-inflammatories and reducing irritation to the tendon. Sent for x-ray to see if there is calcification   Relevant Orders   DG Foot Complete Right       Follow up plan: Return if symptoms worsen or fail to improve.  Counseling provided for all of the vaccine components Orders Placed This Encounter  Procedures  . DG Foot Complete Right    Caryl Pina, MD [facility] 08/16/2016, 9:16 AM

## 2016-08-21 NOTE — Telephone Encounter (Signed)
Patient aware of results.

## 2016-08-27 ENCOUNTER — Encounter: Payer: Self-pay | Admitting: Adult Health

## 2016-08-29 ENCOUNTER — Ambulatory Visit (INDEPENDENT_AMBULATORY_CARE_PROVIDER_SITE_OTHER): Payer: Medicaid Other | Admitting: Adult Health

## 2016-08-29 ENCOUNTER — Encounter: Payer: Self-pay | Admitting: Adult Health

## 2016-08-29 VITALS — BP 100/56 | HR 62 | Ht 62.5 in | Wt 118.0 lb

## 2016-08-29 DIAGNOSIS — N9089 Other specified noninflammatory disorders of vulva and perineum: Secondary | ICD-10-CM | POA: Insufficient documentation

## 2016-08-29 MED ORDER — VALACYCLOVIR HCL 1 G PO TABS: 1000.0000 mg | ORAL_TABLET | Freq: Every day | ORAL | 6 refills | Status: DC

## 2016-08-29 NOTE — Progress Notes (Signed)
Subjective:     Patient ID: Jaime Flores, female   DOB: 01/03/1986, 31 y.o.   MRN: [id number]  HPI   Review of Systems     Objective:   Physical Exam     Assessment:         Plan:

## 2016-08-29 NOTE — Progress Notes (Signed)
Subjective:     Patient ID: Jaime Flores, female   DOB: 07/04/1986, 31 y.o.   MRN: [id number]  HPI Jaime Flores is a 31 year old white female in complaining of bumps left labia, that hurt at times, was check in December for HSV and culture was negative.   Review of Systems Labial bumps,and they hurt at times Reviewed past medical,surgical, social and family history. Reviewed medications and allergies.     Objective:   Physical Exam BP (!) 100/56 (BP Location: Right Arm, Patient Position: Sitting, Cuff Size: Normal)   Pulse 62   Ht 5' 2.5" (1.588 m)   Wt 118 lb (53.5 kg)   LMP 08/01/2016 (Approximate)   BMI 21.24 kg/m   PHQ 2 score 0.Skin warm and dry, has several small areas of excoriation left labia, like drying vesicles.Will try valtrex, as could be virus,so do not shave, ok to clip.    Assessment:     1. Labial irritation       Plan:     Meds ordered this encounter  Medications  . valACYclovir (VALTREX) 1000 MG tablet    Sig: Take 1 tablet (1,000 mg total) by mouth daily.    Dispense:  30 tablet    Refill:  6    Order Specific Question:   Supervising Provider    Answer:   Tania Ade H [[id number]]  Follow up in 4 weeks, will check HSV antibody then

## 2016-09-03 ENCOUNTER — Encounter: Payer: Self-pay | Admitting: Family Medicine

## 2016-09-03 ENCOUNTER — Ambulatory Visit (INDEPENDENT_AMBULATORY_CARE_PROVIDER_SITE_OTHER): Payer: Medicaid Other | Admitting: Family Medicine

## 2016-09-03 VITALS — BP 109/69 | HR 95 | Temp 98.1°F | Ht 62.5 in | Wt 119.0 lb

## 2016-09-03 DIAGNOSIS — R6889 Other general symptoms and signs: Secondary | ICD-10-CM

## 2016-09-03 MED ORDER — OSELTAMIVIR PHOSPHATE 75 MG PO CAPS: 75.0000 mg | ORAL_CAPSULE | Freq: Two times a day (BID) | ORAL | 0 refills | Status: DC

## 2016-09-03 NOTE — Progress Notes (Signed)
BP 109/69   Pulse 95   Temp 98.1 F (36.7 C) (Oral)   Ht 5' 2.5" (1.588 m)   Wt 119 lb (54 kg)   BMI 21.42 kg/m    Subjective:    Patient ID: Jaime Flores, female    DOB: [birth year], 31 y.o.   MRN: [id number]  HPI: Jaime Flores is a 31 y.o. female presenting on 09/03/2016 for Sore Throat; Generalized Body Aches; Cough; and Fever   HPI Sore throat and body aches and cough Patient has been having sore throat and body aches and cough and congestion has been going on since this morning. Both her daughter and her son were diagnosed with flu 3 days ago and today. She has been starting with the symptoms has had sweats but no true fevers yet. She denies any shortness of breath or wheezing. She denies any pressure or pain or ears or congestion.she has not had to use any Tylenol or ibuprofen for this just yet.  Relevant past medical, surgical, family and social history reviewed and updated as indicated. Interim medical history since our last visit reviewed. Allergies and medications reviewed and updated.  Review of Systems  Constitutional: Positive for chills. Negative for fever.  HENT: Positive for congestion, postnasal drip, rhinorrhea, sinus pressure, sneezing and sore throat. Negative for ear discharge and ear pain.   Eyes: Negative for pain, redness and visual disturbance.  Respiratory: Negative for chest tightness and shortness of breath.   Cardiovascular: Negative for chest pain and leg swelling.  Musculoskeletal: Positive for myalgias. Negative for back pain and gait problem.  Skin: Negative for rash.  Neurological: Negative for light-headedness and headaches.  Psychiatric/Behavioral: Negative for agitation and behavioral problems.  All other systems reviewed and are negative.   Per HPI unless specifically indicated above     Objective:    BP 109/69   Pulse 95   Temp 98.1 F (36.7 C) (Oral)   Ht 5' 2.5" (1.588 m)   Wt 119 lb (54 kg)   BMI 21.42 kg/m   Wt  Readings from Last 3 Encounters:  09/03/16 119 lb (54 kg)  08/29/16 118 lb (53.5 kg)  08/16/16 118 lb 6.4 oz (53.7 kg)    Physical Exam  Constitutional: She is oriented to person, place, and time. She appears well-developed and well-nourished. No distress.  HENT:  Right Ear: Tympanic membrane, external ear and ear canal normal.  Left Ear: Tympanic membrane, external ear and ear canal normal.  Nose: Mucosal edema and rhinorrhea present. No epistaxis. Right sinus exhibits no maxillary sinus tenderness and no frontal sinus tenderness. Left sinus exhibits no maxillary sinus tenderness and no frontal sinus tenderness.  Mouth/Throat: Uvula is midline and mucous membranes are normal. Posterior oropharyngeal edema and posterior oropharyngeal erythema present. No oropharyngeal exudate or tonsillar abscesses.  Eyes: Conjunctivae and EOM are normal.  Cardiovascular: Normal rate, regular rhythm, normal heart sounds and intact distal pulses.   No murmur heard. Pulmonary/Chest: Effort normal and breath sounds normal. No respiratory distress. She has no wheezes.  Musculoskeletal: Normal range of motion. She exhibits no edema or tenderness.  Neurological: She is alert and oriented to person, place, and time. Coordination normal.  Skin: Skin is warm and dry. No rash noted. She is not diaphoretic.  Psychiatric: She has a normal mood and affect. Her behavior is normal.  Vitals reviewed.     Assessment & Plan:   Problem List Items Addressed This Visit    None  Visit Diagnoses    Flu-like symptoms    -  Primary   Relevant Medications   oseltamivir (TAMIFLU) 75 MG capsule       Follow up plan: Return if symptoms worsen or fail to improve.  Counseling provided for all of the vaccine components No orders of the defined types were placed in this encounter.   Caryl Pina, MD [facility] 09/03/2016, 5:36 PM

## 2016-09-04 ENCOUNTER — Telehealth: Payer: Self-pay | Admitting: Family Medicine

## 2016-09-04 NOTE — Telephone Encounter (Signed)
Patient does not have the flu. She was given the script to help prevent getting the flu from her child.  Advised to stop tamiflu for now.  Take vitamin C, use tylenol and/or ibuprofen for headache or general body aches , if occur.  Save tamiflu script to take if she gets flu.  Use good hygeine and disinfectants around the home as a preventive measure. Call if any problems.

## 2016-09-26 ENCOUNTER — Encounter: Payer: Self-pay | Admitting: Adult Health

## 2016-09-26 ENCOUNTER — Ambulatory Visit (INDEPENDENT_AMBULATORY_CARE_PROVIDER_SITE_OTHER): Payer: Medicaid Other | Admitting: Adult Health

## 2016-09-26 VITALS — BP 90/56 | HR 75 | Ht 62.5 in | Wt 120.0 lb

## 2016-09-26 DIAGNOSIS — N9089 Other specified noninflammatory disorders of vulva and perineum: Secondary | ICD-10-CM | POA: Diagnosis not present

## 2016-09-26 DIAGNOSIS — K649 Unspecified hemorrhoids: Secondary | ICD-10-CM | POA: Diagnosis not present

## 2016-09-26 NOTE — Progress Notes (Signed)
Subjective:     Patient ID: [name], female   DOB: [birth year], 31 y.o.   MRN: [id number]  HPI Marguerite is a 31 year old white female, back in follow up for labial irritation and bumps are gone, but has hemorrhoids on and off now, since had GB removed, has looser BMs.   Review of Systems Bumps gone from labia Hemorrhoids,with rectal irritation  Reviewed past medical,surgical, social and family history. Reviewed medications and allergies.     Objective:   Physical Exam BP (!) 90/56 (BP Location: Left Arm, Patient Position: Sitting, Cuff Size: Normal)   Pulse 75   Ht 5' 2.5" (1.588 m)   Wt 120 lb (54.4 kg)   LMP 09/14/2016 (Approximate)   BMI 21.60 kg/m    Talk only, she says bumps are gone from labia, but has hemorrhoids on and off, and rectal area gets irritated. She did not undress today.Discussed increasing fiber and using zinc oxide to protect skin and can use preparation H or proctofoam (has at home). Face time 10 minutes. Assessment:        1. Labial irritation   2. Hemorrhoids, unspecified hemorrhoid type    Plan:    Check HSV antibodies Increase fiber Use proctofoam prn Try zinc oxide  Follow up prn

## 2016-09-27 LAB — HSV 2 ANTIBODY, IGG: HSV 2 Glycoprotein G Ab, IgG: <0.91 index (ref 0.00–0.90)

## 2017-01-17 ENCOUNTER — Ambulatory Visit (INDEPENDENT_AMBULATORY_CARE_PROVIDER_SITE_OTHER): Payer: Medicaid Other | Admitting: Family Medicine

## 2017-01-17 ENCOUNTER — Encounter: Payer: Self-pay | Admitting: Family Medicine

## 2017-01-17 VITALS — BP 110/68 | HR 64 | Temp 98.4°F | Ht 62.5 in | Wt 125.0 lb

## 2017-01-17 DIAGNOSIS — S61314D Laceration without foreign body of right ring finger with damage to nail, subsequent encounter: Secondary | ICD-10-CM | POA: Diagnosis not present

## 2017-01-17 NOTE — Progress Notes (Signed)
BP 110/68   Pulse 64   Temp 98.4 F (36.9 C) (Oral)   Ht 5' 2.5" (1.588 m)   Wt 125 lb (56.7 kg)   BMI 22.50 kg/m    Subjective:    Patient ID: Jaime Flores, female    DOB: [birth year], 31 y.o.   MRN: [id number]  HPI: Jaime Flores is a 31 y.o. female presenting on 01/17/2017 for Finger Injury (while at beach last week injured finger while in kiddie pool, went to ER at [facility], wound was cleaned, and Tdap vaccine was given)   HPI Finger injury while at beach Patient sustained a finger injury while swimming at the beach one week ago when she was at [facility]. She was swimming in a [place] with her children and was biting forward and caught her right ring finger under a cement crab statue that was in the pool. It peeled back the top of the nail on that finger and she went to the emergency department at [facility]. She was given bandage and wound care instructions. She kept it covered for the first 36 hours and then since is been changing the bandage daily. She denies any fevers or chills or redness or warmth but does have a lot of pain around where the nail came off.  Relevant past medical, surgical, family and social history reviewed and updated as indicated. Interim medical history since our last visit reviewed. Allergies and medications reviewed and updated.  Review of Systems  Constitutional: Negative for chills and fever.  Respiratory: Negative for chest tightness and shortness of breath.   Cardiovascular: Negative for chest pain and leg swelling.  Musculoskeletal: Negative for back pain and gait problem.  Skin: Positive for wound. Negative for color change and rash.  Neurological: Negative for light-headedness and headaches.  Psychiatric/Behavioral: Negative for agitation and behavioral problems.  All other systems reviewed and are negative.   Per HPI unless specifically indicated above        Objective:    BP 110/68   Pulse 64   Temp 98.4 F (36.9  C) (Oral)   Ht 5' 2.5" (1.588 m)   Wt 125 lb (56.7 kg)   BMI 22.50 kg/m   Wt Readings from Last 3 Encounters:  01/17/17 125 lb (56.7 kg)  09/26/16 120 lb (54.4 kg)  09/03/16 119 lb (54 kg)    Physical Exam  Constitutional: She is oriented to person, place, and time. She appears well-developed and well-nourished. No distress.  Eyes: Conjunctivae are normal.  Cardiovascular: Normal rate, regular rhythm, normal heart sounds and intact distal pulses.   No murmur heard. Pulmonary/Chest: Effort normal and breath sounds normal. No respiratory distress. She has no wheezes.  Neurological: She is alert and oriented to person, place, and time.  Skin: Skin is warm and dry. Laceration (Patient is missing the distal third of her right ring fingernail. There is good granulation pink tissue at the base underneath with no purulence. Tender to palpation. Appears to be starting well into the healing process) noted. No rash noted. She is not diaphoretic.  Psychiatric: She has a normal mood and affect. Her behavior is normal.  Nursing note and vitals reviewed.     Assessment & Plan:   Problem List Items Addressed This Visit    None    Visit Diagnoses    Laceration of right ring finger without foreign body with damage to nail, subsequent encounter    -  Primary  Patient instructed to use triple antibiotic ointment on the wound twice a day and to keep covered during the daytime and leave uncovered at night.  Follow up plan: Return if symptoms worsen or fail to improve.  Counseling provided for all of the vaccine components No orders of the defined types were placed in this encounter.   Caryl Pina, MD [facility] 01/17/2017, 12:43 PM

## 2017-05-15 ENCOUNTER — Ambulatory Visit (INDEPENDENT_AMBULATORY_CARE_PROVIDER_SITE_OTHER): Payer: Medicaid Other | Admitting: Family Medicine

## 2017-05-15 ENCOUNTER — Encounter: Payer: Self-pay | Admitting: Family Medicine

## 2017-05-15 VITALS — BP 104/67 | HR 70 | Temp 98.5°F | Ht 62.5 in | Wt 126.0 lb

## 2017-05-15 DIAGNOSIS — J189 Pneumonia, unspecified organism: Secondary | ICD-10-CM

## 2017-05-15 DIAGNOSIS — R300 Dysuria: Secondary | ICD-10-CM | POA: Diagnosis not present

## 2017-05-15 LAB — MICROSCOPIC EXAMINATION
Epithelial Cells (non renal): >10 /hpf — AB (ref 0–10)
Renal Epithel, UA: NONE SEEN /hpf

## 2017-05-15 LAB — URINALYSIS, COMPLETE
BILIRUBIN UA: NEGATIVE
GLUCOSE, UA: NEGATIVE
KETONES UA: NEGATIVE
Nitrite, UA: NEGATIVE
PROTEIN UA: NEGATIVE
SPEC GRAV UA: 1.005 (ref 1.005–1.030)
Urobilinogen, Ur: 0.2 mg/dL (ref 0.2–1.0)
pH, UA: 6.5 (ref 5.0–7.5)

## 2017-05-15 MED ORDER — CEPHALEXIN 500 MG PO CAPS: 500.0000 mg | ORAL_CAPSULE | Freq: Four times a day (QID) | ORAL | 0 refills | Status: DC

## 2017-05-15 NOTE — Progress Notes (Signed)
BP 104/67   Pulse 70   Temp 98.5 F (36.9 C) (Oral)   Ht 5' 2.5" (1.588 m)   Wt 126 lb (57.2 kg)   BMI 22.68 kg/m    Subjective:    Patient ID: Jaime Flores, female    DOB: 12-27-1985, 31 y.o.   MRN: [id number]  HPI: Jaime Flores is a 31 y.o. female presenting on 05/15/2017 for Sinusitis (sinus pressure, runny nose, nasal congestion, sinus drainage; symptoms began 1 week ago, has tried Sudafed and Tylenol Cold and Flu); Cough; and Urinary Tract Infection (strong urinary odor)   HPI Cough and congestion and sinus drainage Patient comes in complaining of cough and congestion and sinus drainage that began 1 week ago.  She has been trying Sudafed and sinus and cold medication and flu medication without much success.  She denies any fevers but has had some chills.  She denies any shortness of breath or chest pain or wheezing.  Her cough has been productive of yellow-green sputum some of the time.  She feels like she has sinus pressure under her eyes and drainage down the back of her throat especially at nighttime.  Strong urinary odor Patient has been complaining of strong urinary odor for the past couple weeks.  She said initially she did have some dysuria and burning when it started but she has not had that since.  She denies any abdominal pain or flank pain but just still has the strong urinary odor and although she is drinking a lot of fluids it is not clearing up.  She denies any vaginal discharge or irritation or pain.  Relevant past medical, surgical, family and social history reviewed and updated as indicated. Interim medical history since our last visit reviewed. Allergies and medications reviewed and updated.  Review of Systems  Constitutional: Positive for chills. Negative for fever.  HENT: Positive for congestion, postnasal drip, rhinorrhea, sinus pressure and sore throat. Negative for ear discharge, ear pain and sneezing.   Eyes: Negative for pain, redness and visual  disturbance.  Respiratory: Positive for cough. Negative for chest tightness, shortness of breath and wheezing.   Cardiovascular: Negative for chest pain and leg swelling.  Genitourinary: Negative for difficulty urinating and dysuria.  Musculoskeletal: Negative for back pain and gait problem.  Skin: Negative for rash.  Neurological: Negative for light-headedness and headaches.  Psychiatric/Behavioral: Negative for agitation and behavioral problems.  All other systems reviewed and are negative.   Per HPI unless specifically indicated above     Objective:    BP 104/67   Pulse 70   Temp 98.5 F (36.9 C) (Oral)   Ht 5' 2.5" (1.588 m)   Wt 126 lb (57.2 kg)   BMI 22.68 kg/m   Wt Readings from Last 3 Encounters:  05/15/17 126 lb (57.2 kg)  01/17/17 125 lb (56.7 kg)  09/26/16 120 lb (54.4 kg)    Physical Exam  Constitutional: She is oriented to person, place, and time. She appears well-developed and well-nourished. No distress.  HENT:  Right Ear: Tympanic membrane, external ear and ear canal normal.  Left Ear: Tympanic membrane, external ear and ear canal normal.  Nose: Mucosal edema and rhinorrhea present. No epistaxis. Right sinus exhibits no maxillary sinus tenderness and no frontal sinus tenderness. Left sinus exhibits no maxillary sinus tenderness and no frontal sinus tenderness.  Mouth/Throat: Uvula is midline and mucous membranes are normal. Posterior oropharyngeal edema and posterior oropharyngeal erythema present. No oropharyngeal exudate or tonsillar abscesses.  Eyes: Conjunctivae and EOM are normal.  Neck: Neck supple. No thyromegaly present.  Cardiovascular: Normal rate, regular rhythm, normal heart sounds and intact distal pulses.   No murmur heard. Pulmonary/Chest: Effort normal and breath sounds normal. No respiratory distress. She has no wheezes. She has no rales.  Abdominal: Soft. Bowel sounds are normal. She exhibits no distension. There is no tenderness. There is no  rebound and no guarding.  Musculoskeletal: Normal range of motion. She exhibits no edema.  Lymphadenopathy:    She has no cervical adenopathy.  Neurological: She is alert and oriented to person, place, and time. Coordination normal.  Skin: Skin is warm and dry. No rash noted. She is not diaphoretic.  Psychiatric: She has a normal mood and affect. Her behavior is normal.  Vitals reviewed.     Assessment & Plan:   Problem List Items Addressed This Visit    None    Visit Diagnoses    Dysuria    -  Primary   Relevant Medications   cephALEXin (KEFLEX) 500 MG capsule   Other Relevant Orders   Urinalysis, Complete   Atypical pneumonia       Relevant Medications   cephALEXin (KEFLEX) 500 MG capsule       Follow up plan: Return if symptoms worsen or fail to improve.  Counseling provided for all of the vaccine components Orders Placed This Encounter  Procedures  . Urinalysis, Complete    Caryl Pina, MD [facility] 05/15/2017, 3:57 PM

## 2017-05-17 ENCOUNTER — Telehealth: Payer: Self-pay | Admitting: Family Medicine

## 2017-05-17 NOTE — Telephone Encounter (Signed)
Patient has had diarrhea and headache since starting antibiotic.

## 2017-05-18 ENCOUNTER — Telehealth: Payer: Self-pay | Admitting: Family Medicine

## 2017-05-18 NOTE — Telephone Encounter (Signed)
Aware.  Try immodium  And BRAT diet with lots of water for now and report back to Dr. Warrick Parisian on Monday for further concerns.

## 2017-05-21 ENCOUNTER — Encounter: Payer: Self-pay | Admitting: Family Medicine

## 2017-10-28 ENCOUNTER — Encounter: Payer: Self-pay | Admitting: Family Medicine

## 2017-10-28 ENCOUNTER — Ambulatory Visit: Payer: Medicaid Other | Admitting: Family Medicine

## 2017-10-28 VITALS — BP 104/67 | HR 78 | Temp 98.1°F | Ht 62.5 in | Wt 130.0 lb

## 2017-10-28 DIAGNOSIS — M546 Pain in thoracic spine: Secondary | ICD-10-CM | POA: Diagnosis not present

## 2017-10-28 MED ORDER — CYCLOBENZAPRINE HCL 10 MG PO TABS: 10.0000 mg | ORAL_TABLET | Freq: Three times a day (TID) | ORAL | 0 refills | Status: DC | PRN

## 2017-10-28 NOTE — Progress Notes (Signed)
BP 104/67   Pulse 78   Temp 98.1 F (36.7 C) (Oral)   Ht 5' 2.5" (1.588 m)   Wt 130 lb (59 kg)   BMI 23.40 kg/m    Subjective:    Patient ID: Jaime Flores, female    DOB: 05/10/1986, 32 y.o.   MRN: [id number]  HPI: Jaime Flores is a 32 y.o. female presenting on 10/28/2017 for Spasms in back (x 4-5 days)   HPI Back spasms Patient has been having back muscle spasms intermittently over the past 4 or 5 days.  She says is worse when she is on her feet for prolonged periods of time.  She has been using ibuprofen up to 800 mg with minimal relief.  She says in the past when she has had this a few years ago she was given Flexeril and that helped.  She is wondering if she can get a prescription for that.  She denies any pain radiating anywhere else besides her mid back.  She is about to start a job where she will be on her feet a lot and is worried that this could make it worse.  She denies any loss of urine or loss of bowel or bladder.  Relevant past medical, surgical, family and social history reviewed and updated as indicated. Interim medical history since our last visit reviewed. Allergies and medications reviewed and updated.  Review of Systems  Constitutional: Negative for chills and fever.  Eyes: Negative for visual disturbance.  Respiratory: Negative for chest tightness and shortness of breath.   Cardiovascular: Negative for chest pain and leg swelling.  Genitourinary: Negative for dysuria and flank pain.  Musculoskeletal: Positive for back pain. Negative for arthralgias and gait problem.  Skin: Negative for rash.  Neurological: Negative for weakness, light-headedness, numbness and headaches.  Psychiatric/Behavioral: Negative for agitation and behavioral problems.  All other systems reviewed and are negative.   Per HPI unless specifically indicated above   Allergies as of 10/28/2017      Reactions   Doxycycline Shortness Of Breath   Wellbutrin [bupropion] Shortness Of  Breath, Nausea Only, Palpitations      Medication List        Accurate as of 10/28/17  9:50 AM. Always use your most recent med list.          cyclobenzaprine 10 MG tablet Commonly known as:  FLEXERIL Take 1 tablet (10 mg total) by mouth 3 (three) times daily as needed for muscle spasms.          Objective:    BP 104/67   Pulse 78   Temp 98.1 F (36.7 C) (Oral)   Ht 5' 2.5" (1.588 m)   Wt 130 lb (59 kg)   BMI 23.40 kg/m   Wt Readings from Last 3 Encounters:  10/28/17 130 lb (59 kg)  05/15/17 126 lb (57.2 kg)  01/17/17 125 lb (56.7 kg)    Physical Exam  Constitutional: She is oriented to person, place, and time. She appears well-developed and well-nourished. No distress.  Eyes: Conjunctivae are normal.  Cardiovascular: Regular rhythm.  Abdominal: Soft. Bowel sounds are normal. She exhibits no distension. There is no tenderness. There is no rebound.  Musculoskeletal:       Thoracic back: She exhibits tenderness. She exhibits normal range of motion, no bony tenderness and no swelling.       Back:  Neurological: She is alert and oriented to person, place, and time. Coordination normal.  Skin: Skin is warm  and dry. No rash noted. She is not diaphoretic.  Psychiatric: She has a normal mood and affect. Her behavior is normal.  Nursing note and vitals reviewed.       Assessment & Plan:   Problem List Items Addressed This Visit    None    Visit Diagnoses    Acute bilateral thoracic back pain    -  Primary   Bilateral mid thoracic back pain, will try muscle relaxer, continue ibuprofen   Relevant Medications   cyclobenzaprine (FLEXERIL) 10 MG tablet       Follow up plan: Return if symptoms worsen or fail to improve.  Counseling provided for all of the vaccine components No orders of the defined types were placed in this encounter.   Caryl Pina, MD [facility] 10/28/2017, 9:50 AM

## 2017-12-09 ENCOUNTER — Ambulatory Visit (INDEPENDENT_AMBULATORY_CARE_PROVIDER_SITE_OTHER): Payer: Medicaid Other | Admitting: Family Medicine

## 2017-12-09 ENCOUNTER — Encounter: Payer: Self-pay | Admitting: Family Medicine

## 2017-12-09 VITALS — BP 105/60 | HR 77 | Temp 97.7°F | Ht 62.5 in | Wt 126.0 lb

## 2017-12-09 DIAGNOSIS — K2100 Gastro-esophageal reflux disease with esophagitis, without bleeding: Secondary | ICD-10-CM

## 2017-12-09 DIAGNOSIS — Z23 Encounter for immunization: Secondary | ICD-10-CM | POA: Diagnosis not present

## 2017-12-09 DIAGNOSIS — Z13220 Encounter for screening for lipoid disorders: Secondary | ICD-10-CM

## 2017-12-09 DIAGNOSIS — Z111 Encounter for screening for respiratory tuberculosis: Secondary | ICD-10-CM

## 2017-12-09 DIAGNOSIS — Z716 Tobacco abuse counseling: Secondary | ICD-10-CM

## 2017-12-09 DIAGNOSIS — Z021 Encounter for pre-employment examination: Secondary | ICD-10-CM

## 2017-12-09 DIAGNOSIS — Z131 Encounter for screening for diabetes mellitus: Secondary | ICD-10-CM

## 2017-12-09 DIAGNOSIS — Z0000 Encounter for general adult medical examination without abnormal findings: Secondary | ICD-10-CM

## 2017-12-09 DIAGNOSIS — R110 Nausea: Secondary | ICD-10-CM

## 2017-12-09 MED ORDER — NICOTINE 14 MG/24HR TD PT24: 14.0000 mg | MEDICATED_PATCH | Freq: Every day | TRANSDERMAL | 0 refills | Status: DC

## 2017-12-09 MED ORDER — NICOTINE 7 MG/24HR TD PT24: 7.0000 mg | MEDICATED_PATCH | Freq: Every day | TRANSDERMAL | 0 refills | Status: DC

## 2017-12-09 MED ORDER — RANITIDINE HCL 150 MG PO TABS
150.0000 mg | ORAL_TABLET | Freq: Two times a day (BID) | ORAL | 11 refills | Status: DC
Start: 2017-12-09 — End: 2018-11-26

## 2017-12-09 NOTE — Progress Notes (Signed)
BP 105/60   Pulse 77   Temp 97.7 F (36.5 C) (Oral)   Ht 5' 2.5" (1.588 m)   Wt 126 lb (57.2 kg)   BMI 22.68 kg/m    Subjective:    Patient ID: Jaime Flores, female    DOB: [birth year], 32 y.o.   MRN: [id number]  HPI: Jaime Flores is a 32 y.o. female presenting on 12/09/2017 for Employment Physical (needs TB test and Boostrix vaccine) and Emesis (few times last night, this morning)   HPI Well adult physical and preemployment exam Patient is coming in well adult exam and physical examination and preemployment examination.  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  She has been liking her job a lot and hopes to continue to work letter for next year.  She is smoking 1/2 pack/day which is down from a pack per day and would like to quit and would like to try nicotine patches.  Patient needs to get Tdap and TB test today.  She is also here to get blood work.  She says she already had a Pap smear this year.  Patient says she has been having some nausea and vomiting over the past couple days that culminated in last night.  She did say she had diarrhea or food poisoning last 4 days but that has improved but now just the irritation in her stomach and nausea vomiting that is mainly occurred at night.  She says she mainly vomited stomach acid but she was just feeling very nauseated when it happened and she felt flushed as well.  She says she has not really had this during the day but has had this previously at night on other occasions.  She does have a tubal ligation but she is frequently sexually active.  Relevant past medical, surgical, family and social history reviewed and updated as indicated. Interim medical history since our last visit reviewed. Allergies and medications reviewed and updated.  Review of Systems  Constitutional: Negative for chills and fever.  HENT: Negative for ear pain and tinnitus.     Eyes: Negative for pain.  Respiratory: Negative for cough, shortness of breath and wheezing.   Cardiovascular: Negative for chest pain, palpitations and leg swelling.  Gastrointestinal: Positive for abdominal pain (Intermittent), nausea and vomiting. Negative for blood in stool, constipation and diarrhea.  Genitourinary: Negative for dysuria and hematuria.  Musculoskeletal: Negative for back pain and myalgias.  Skin: Negative for rash.  Neurological: Negative for dizziness, weakness and headaches.  Psychiatric/Behavioral: Negative for suicidal ideas.    Per HPI unless specifically indicated above   Allergies as of 12/09/2017      Reactions   Doxycycline Shortness Of Breath   Wellbutrin [bupropion] Shortness Of Breath, Nausea Only, Palpitations      Medication List        Accurate as of 12/09/17  3:18 PM. Always use your most recent med list.          cyclobenzaprine 10 MG tablet Commonly known as:  FLEXERIL Take 1 tablet (10 mg total) by mouth 3 (three) times daily as needed for muscle spasms.   ranitidine 150 MG tablet Commonly known as:  ZANTAC Take 1 tablet (150 mg total) by mouth 2 (two) times daily.          Objective:    BP 105/60   Pulse 77   Temp 97.7 F (36.5 C) (Oral)  Ht 5' 2.5" (1.588 m)   Wt 126 lb (57.2 kg)   BMI 22.68 kg/m   Wt Readings from Last 3 Encounters:  12/09/17 126 lb (57.2 kg)  10/28/17 130 lb (59 kg)  05/15/17 126 lb (57.2 kg)    Physical Exam  Constitutional: She is oriented to person, place, and time. She appears well-developed and well-nourished. No distress.  Eyes: Conjunctivae are normal.  Cardiovascular: Normal rate, regular rhythm, normal heart sounds and intact distal pulses.  No murmur heard. Pulmonary/Chest: Effort normal and breath sounds normal. No respiratory distress. She has no wheezes.  Abdominal: Soft. Bowel sounds are normal. She exhibits no distension and no mass. There is no tenderness. There is no rebound and  no guarding.  Musculoskeletal: Normal range of motion. She exhibits no edema.  Neurological: She is alert and oriented to person, place, and time. Coordination normal.  Skin: Skin is warm and dry. No rash noted. She is not diaphoretic.  Psychiatric: She has a normal mood and affect. Her behavior is normal.  Nursing note and vitals reviewed.       Assessment & Plan:   Problem List Items Addressed This Visit    None    Visit Diagnoses    Pre-employment examination    -  Primary   Relevant Orders   PPD   Encounter for smoking cessation counseling       Relevant Medications   nicotine (NICODERM CQ - DOSED IN MG/24 HOURS) 14 mg/24hr patch   nicotine (NICODERM CQ - DOSED IN MG/24 HR) 7 mg/24hr patch   Lipid screening       Relevant Orders   Lipid panel   Diabetes mellitus screening       Relevant Orders   CMP14+EGFR   Gastroesophageal reflux disease with esophagitis       Relevant Medications   ranitidine (ZANTAC) 150 MG tablet   Other Relevant Orders   CBC with Differential/Platelet   Nausea       Relevant Orders   hCG, serum, qualitative     Will try acid reflux medication for abdominal pain nausea and vomiting because it sounds like she has had this issue recurrently.  She wants [place] to add on a serum alterative hCG to make sure that she is not pregnant.  Follow up plan: Return in about 1 year (around 12/10/2018), or if symptoms worsen or fail to improve, for Physical exam.  Counseling provided for all of the vaccine components Orders Placed This Encounter  Procedures  . Tdap vaccine greater than or equal to 7yo IM  . CBC with Differential/Platelet  . CMP14+EGFR  . Lipid panel  . hCG, serum, qualitative  . PPD    Caryl Pina, MD [facility] 12/09/2017, 3:18 PM

## 2017-12-10 LAB — CMP14+EGFR
ALT: 13 IU/L (ref 0–32)
AST: 19 IU/L (ref 0–40)
Albumin/Globulin Ratio: 2.2 (ref 1.2–2.2)
Albumin: 4.6 g/dL (ref 3.5–5.5)
Alkaline Phosphatase: 92 IU/L (ref 39–117)
BILIRUBIN TOTAL: 0.5 mg/dL (ref 0.0–1.2)
BUN/Creatinine Ratio: 7 — ABNORMAL LOW (ref 9–23)
BUN: 6 mg/dL (ref 6–20)
CHLORIDE: 103 mmol/L (ref 96–106)
CO2: 24 mmol/L (ref 20–29)
Calcium: 9.5 mg/dL (ref 8.7–10.2)
Creatinine, Ser: 0.82 mg/dL (ref 0.57–1.00)
GFR calc non Af Amer: 97 mL/min/{1.73_m2} (ref >59)
GFR, EST AFRICAN AMERICAN: 112 mL/min/{1.73_m2} (ref >59)
GLUCOSE: 76 mg/dL (ref 65–99)
Globulin, Total: 2.1 g/dL (ref 1.5–4.5)
POTASSIUM: 3.6 mmol/L (ref 3.5–5.2)
Sodium: 143 mmol/L (ref 134–144)
TOTAL PROTEIN: 6.7 g/dL (ref 6.0–8.5)

## 2017-12-10 LAB — CBC WITH DIFFERENTIAL/PLATELET
BASOS ABS: 0 10*3/uL (ref 0.0–0.2)
Basos: 1 %
EOS (ABSOLUTE): 0.1 10*3/uL (ref 0.0–0.4)
Eos: 2 %
Hematocrit: 42.8 % (ref 34.0–46.6)
Hemoglobin: 13.9 g/dL (ref 11.1–15.9)
IMMATURE GRANS (ABS): 0 10*3/uL (ref 0.0–0.1)
Immature Granulocytes: 0 %
LYMPHS: 37 %
Lymphocytes Absolute: 2.3 10*3/uL (ref 0.7–3.1)
MCH: 29.9 pg (ref 26.6–33.0)
MCHC: 32.5 g/dL (ref 31.5–35.7)
MCV: 92 fL (ref 79–97)
Monocytes Absolute: 0.3 10*3/uL (ref 0.1–0.9)
Monocytes: 5 %
NEUTROS ABS: 3.5 10*3/uL (ref 1.4–7.0)
Neutrophils: 55 %
PLATELETS: 285 10*3/uL (ref 150–450)
RBC: 4.65 x10E6/uL (ref 3.77–5.28)
RDW: 13.7 % (ref 12.3–15.4)
WBC: 6.3 10*3/uL (ref 3.4–10.8)

## 2017-12-10 LAB — LIPID PANEL
CHOL/HDL RATIO: 4.1 ratio (ref 0.0–4.4)
CHOLESTEROL TOTAL: 153 mg/dL (ref 100–199)
HDL: 37 mg/dL — ABNORMAL LOW (ref >39)
LDL Calculated: 103 mg/dL — ABNORMAL HIGH (ref 0–99)
Triglycerides: 64 mg/dL (ref 0–149)
VLDL Cholesterol Cal: 13 mg/dL (ref 5–40)

## 2017-12-10 LAB — HCG, SERUM, QUALITATIVE: hCG,Beta Subunit,Qual,Serum: NEGATIVE m[IU]/mL (ref <6)

## 2017-12-11 LAB — TB SKIN TEST
Induration: 0 mm
TB Skin Test: NEGATIVE

## 2017-12-26 ENCOUNTER — Telehealth: Payer: Self-pay | Admitting: Adult Health

## 2017-12-26 NOTE — Telephone Encounter (Signed)
Or can call [phone] can you call pt and advise on some bleeding issues she is having and advise [place] if she needs appt or not

## 2017-12-27 ENCOUNTER — Telehealth: Payer: Self-pay | Admitting: Obstetrics & Gynecology

## 2017-12-27 ENCOUNTER — Encounter: Payer: Self-pay | Admitting: Family Medicine

## 2017-12-27 DIAGNOSIS — F5089 Other specified eating disorder: Secondary | ICD-10-CM

## 2017-12-27 NOTE — Telephone Encounter (Signed)
Spoke with pt. Last month, period started and it was light.  Period lasted for 2 days. Period this month started Monday. It's brown blood. Pt is still having some spotting. Pt is not on birth control. I advised can schedule an appt. Pt wants to be seen. Call transferred to front desk for appt. [place]

## 2017-12-28 MED ORDER — MULTIVITAMIN INFANT & TODDLER PO SOLN: 5.0000 mL | Freq: Every day | ORAL | 11 refills | Status: DC

## 2017-12-28 NOTE — Telephone Encounter (Signed)
Patient has clinical symptoms of pica, will run lab values, please let her know I put in the labs so they can come in anytime to do them.

## 2017-12-30 ENCOUNTER — Encounter: Payer: Self-pay | Admitting: Adult Health

## 2017-12-30 NOTE — Telephone Encounter (Signed)
Let pt know do not have any sooner appts

## 2018-01-02 ENCOUNTER — Other Ambulatory Visit: Payer: Self-pay

## 2018-01-02 ENCOUNTER — Ambulatory Visit: Payer: Medicaid Other | Admitting: Adult Health

## 2018-01-02 ENCOUNTER — Encounter: Payer: Self-pay | Admitting: Adult Health

## 2018-01-02 VITALS — BP 103/58 | HR 91 | Ht 62.0 in | Wt 127.0 lb

## 2018-01-02 DIAGNOSIS — Z3202 Encounter for pregnancy test, result negative: Secondary | ICD-10-CM | POA: Diagnosis not present

## 2018-01-02 DIAGNOSIS — N926 Irregular menstruation, unspecified: Secondary | ICD-10-CM | POA: Diagnosis not present

## 2018-01-02 LAB — POCT URINE PREGNANCY: PREG TEST UR: NEGATIVE

## 2018-01-02 MED ORDER — MEGESTROL ACETATE 40 MG PO TABS: 40.0000 mg | ORAL_TABLET | Freq: Every day | ORAL | 1 refills | Status: DC

## 2018-01-02 NOTE — Progress Notes (Signed)
  Subjective:     Patient ID: Jaime Flores, female   DOB: 08/01/1985, 32 y.o.   MRN: [id number]  HPI Jaime Flores is a 32 year old white female, in complaining of irregular bleeding and it is black. PCP is [facility].  Review of Systems Skipped period in April. May was light and only 2 days, then June started 6/3 black and is still bleeding No pain with sex  Reviewed past medical,surgical, social and family history. Reviewed medications and allergies.     Objective:   Physical Exam BP (!) 103/58 (BP Location: Left Arm, Patient Position: Sitting, Cuff Size: Normal)   Pulse 91   Ht 5\' 2"  (1.575 m)   Wt 127 lb (57.6 kg)   LMP 12/23/2017   BMI 23.23 kg/m UPT negative,Skin warm and dry.Pelvic: external genitalia is normal in appearance no lesions, vagina:pink blood without odor,urethra has no lesions or masses noted, cervix:smooth and bulbous, uterus: normal size, shape and contour, non tender, no masses felt, adnexa: no masses or tenderness noted. Bladder is non tender and no masses felt.    Will try megace to stop bleeding, she had labs with PCP recently.  Assessment:     1. Irregular bleeding   2. Pregnancy examination or test, negative result       Plan:     Meds ordered this encounter  Medications  . megestrol (MEGACE) 40 MG tablet    Sig: Take 1 tablet (40 mg total) by mouth daily.    Dispense:  30 tablet    Refill:  1    Order Specific Question:   Supervising Provider    Answer:   Florian Buff [[id number]]  Pap and physical in December

## 2018-01-03 ENCOUNTER — Encounter: Payer: Self-pay | Admitting: Family Medicine

## 2018-01-03 MED ORDER — NICOTINE 21 MG/24HR TD PT24: 21.0000 mg | MEDICATED_PATCH | Freq: Every day | TRANSDERMAL | 0 refills | Status: DC

## 2018-01-09 ENCOUNTER — Ambulatory Visit: Payer: Medicaid Other | Admitting: Adult Health

## 2018-03-21 ENCOUNTER — Ambulatory Visit: Payer: Medicaid Other | Admitting: Family Medicine

## 2018-03-21 ENCOUNTER — Encounter: Payer: Self-pay | Admitting: Family Medicine

## 2018-03-21 VITALS — BP 103/67 | HR 73 | Temp 98.0°F | Ht 62.0 in | Wt 129.6 lb

## 2018-03-21 DIAGNOSIS — R350 Frequency of micturition: Secondary | ICD-10-CM

## 2018-03-21 DIAGNOSIS — R197 Diarrhea, unspecified: Secondary | ICD-10-CM

## 2018-03-21 LAB — URINALYSIS, COMPLETE
Bilirubin, UA: NEGATIVE
GLUCOSE, UA: NEGATIVE
KETONES UA: NEGATIVE
NITRITE UA: NEGATIVE
Protein, UA: NEGATIVE
SPEC GRAV UA: 1.01 (ref 1.005–1.030)
UUROB: 0.2 mg/dL (ref 0.2–1.0)
pH, UA: 6 (ref 5.0–7.5)

## 2018-03-21 LAB — MICROSCOPIC EXAMINATION
Epithelial Cells (non renal): >10 /hpf — AB (ref 0–10)
Renal Epithel, UA: NONE SEEN /hpf

## 2018-03-21 MED ORDER — CEPHALEXIN 500 MG PO CAPS: 500.0000 mg | ORAL_CAPSULE | Freq: Two times a day (BID) | ORAL | 0 refills | Status: AC

## 2018-03-21 NOTE — Patient Instructions (Signed)
Urinary Tract Infection, Adult A urinary tract infection (UTI) is an infection of any part of the urinary tract. The urinary tract includes the:  Kidneys.  Ureters.  Bladder.  Urethra.  These organs make, store, and get rid of pee (urine) in the body. Follow these instructions at home:  Take over-the-counter and prescription medicines only as told by your doctor.  If you were prescribed an antibiotic medicine, take it as told by your doctor. Do not stop taking the antibiotic even if you start to feel better.  Avoid the following drinks: ? Alcohol. ? Caffeine. ? Tea. ? Carbonated drinks.  Drink enough fluid to keep your pee clear or pale yellow.  Keep all follow-up visits as told by your doctor. This is important.  Make sure to: ? Empty your bladder often and completely. Do not to hold pee for long periods of time. ? Empty your bladder before and after sex. ? Wipe from front to back after a bowel movement if you are female. Use each tissue one time when you wipe. Contact a doctor if:  You have back pain.  You have a fever.  You feel sick to your stomach (nauseous).  You throw up (vomit).  Your symptoms do not get better after 3 days.  Your symptoms go away and then come back. Get help right away if:  You have very bad back pain.  You have very bad lower belly (abdominal) pain.  You are throwing up and cannot keep down any medicines or water. This information is not intended to replace advice given to you by your health care provider. Make sure you discuss any questions you have with your health care provider. Document Released: 12/26/2007 Document Revised: 12/15/2015 Document Reviewed: 05/30/2015 Elsevier Interactive Patient Education  2018 [employer]. Diarrhea, Adult Diarrhea is when you have loose and water poop (stool) often. Diarrhea can make you feel weak and cause you to get dehydrated. Dehydration can make you tired and thirsty, make you have a dry  mouth, and make it so you pee (urinate) less often. Diarrhea often lasts 2-3 days. However, it can last longer if it is a sign of something more serious. It is important to treat your diarrhea as told by your doctor. Follow these instructions at home: Eating and drinking  Follow these recommendations as told by your doctor:  Take an oral rehydration solution (ORS). This is a drink that is sold at pharmacies and stores.  Drink clear fluids, such as: ? Water. ? Ice chips. ? Diluted fruit juice. ? Low-calorie sports drinks.  Eat bland, easy-to-digest foods in small amounts as you are able. These foods include: ? Bananas. ? Applesauce. ? Rice. ? Low-fat (lean) meats. ? Toast. ? Crackers.  Avoid drinking fluids that have a lot of sugar or caffeine in them.  Avoid alcohol.  Avoid spicy or fatty foods.  General instructions   Drink enough fluid to keep your pee (urine) clear or pale yellow.  Wash your hands often. If you cannot use soap and water, use hand sanitizer.  Make sure that all people in your home wash their hands well and often.  Take over-the-counter and prescription medicines only as told by your doctor.  Rest at home while you get better.  Watch your condition for any changes.  Take a warm bath to help with any burning or pain from having diarrhea.  Keep all follow-up visits as told by your doctor. This is important. Contact a doctor if:  You  have a fever.  Your diarrhea gets worse.  You have new symptoms.  You cannot keep fluids down.  You feel light-headed or dizzy.  You have a headache.  You have muscle cramps. Get help right away if:  You have chest pain.  You feel very weak or you pass out (faint).  You have bloody or black poop or poop that look like tar.  You have very bad pain, cramping, or bloating in your belly (abdomen).  You have trouble breathing or you are breathing very quickly.  Your heart is beating very quickly.  Your  skin feels cold and clammy.  You feel confused.  You have signs of dehydration, such as: ? Dark pee, hardly any pee, or no pee. ? Cracked lips. ? Dry mouth. ? Sunken eyes. ? Sleepiness. ? Weakness. This information is not intended to replace advice given to you by your health care provider. Make sure you discuss any questions you have with your health care provider. Document Released: 12/26/2007 Document Revised: 01/27/2016 Document Reviewed: 03/15/2015 Elsevier Interactive Patient Education  2018 [employer].

## 2018-03-21 NOTE — Progress Notes (Signed)
Subjective: CC: upset stomach PCP: [name], Fransisca Kaufmann, MD Jaime Flores is a 32 y.o. female presenting to clinic today for:  1. Upset stomach Patient reports a 3-day history of watery diarrhea that is nonbloody.  She notes that when the symptoms first started she was using the restroom every 20 minutes.  She states sometimes she goes to use the restroom and is having cramping sensation but no defecation.  Denies any vomiting but does state that she has nausea with the diarrhea.  She is tolerating fluids and food without difficulty.  Denies any consumption of undercooked foods, foods left out too long.  No recent travel.  No consumption of untreated water.  No known sick contacts.  She works in [employer] and reports that she handles the food appropriately.  No fevers.  She does report abdominal soreness.  Past surgical history significant for appendectomy, cholecystectomy and BTL.  2.  Dysuria/urinary urgency Patient reports a 2-day history of dysuria and urinary urgency.  Denies any hematuria but states she is getting ready to have her menstrual cycle any day now so she is unsure if there is any microscopic blood.  She notes nausea with diarrhea as above.  No new back pain.  She has been hydrating and attempts to relieve symptoms.   ROS: Per HPI  Allergies  Allergen Reactions  . Doxycycline Shortness Of Breath  . Wellbutrin [Bupropion] Shortness Of Breath, Nausea Only and Palpitations   Past Medical History:  Diagnosis Date  . Amenorrhea 01/27/2013  . Breast pain 03/10/2015  . Encounter for menstrual regulation 07/05/2015  . Irregular intermenstrual bleeding 07/28/2015  . Irregular periods 07/05/2015  . Screening for STD (sexually transmitted disease) 08/07/2013  . UTI (urinary tract infection)   . Vaginal discharge 06/10/2013    Current Outpatient Medications:  .  cyclobenzaprine (FLEXERIL) 10 MG tablet, Take 1 tablet (10 mg total) by mouth 3 (three) times daily as needed  for muscle spasms., Disp: 30 tablet, Rfl: 0 .  megestrol (MEGACE) 40 MG tablet, Take 1 tablet (40 mg total) by mouth daily., Disp: 30 tablet, Rfl: 1 .  nicotine (NICODERM CQ - DOSED IN MG/24 HOURS) 14 mg/24hr patch, Place 1 patch (14 mg total) onto the skin daily. (Patient not taking: Reported on 01/02/2018), Disp: 28 patch, Rfl: 0 .  nicotine (NICODERM CQ - DOSED IN MG/24 HOURS) 21 mg/24hr patch, Place 1 patch (21 mg total) onto the skin daily., Disp: 28 patch, Rfl: 0 .  nicotine (NICODERM CQ - DOSED IN MG/24 HR) 7 mg/24hr patch, Place 1 patch (7 mg total) onto the skin daily. (Patient not taking: Reported on 01/02/2018), Disp: 28 patch, Rfl: 0 .  ranitidine (ZANTAC) 150 MG tablet, Take 1 tablet (150 mg total) by mouth 2 (two) times daily., Disp: 60 tablet, Rfl: 11 Social History   Socioeconomic History  . Marital status: Single    Spouse name: Not on file  . Number of children: Not on file  . Years of education: Not on file  . Highest education level: Not on file  Occupational History  . Not on file  Social Needs  . Financial resource strain: Not on file  . Food insecurity:    Worry: Not on file    Inability: Not on file  . Transportation needs:    Medical: Not on file    Non-medical: Not on file  Tobacco Use  . Smoking status: Current Every Day Smoker    Packs/day: 0.50  Years: 8.00    Pack years: 4.00    Types: Cigarettes  . Smokeless tobacco: Never Used  Substance and Sexual Activity  . Alcohol use: No  . Drug use: No  . Sexual activity: Yes    Birth control/protection: Surgical    Comment: tubal  Lifestyle  . Physical activity:    Days per week: Not on file    Minutes per session: Not on file  . Stress: Not on file  Relationships  . Social connections:    Talks on phone: Not on file    Gets together: Not on file    Attends religious service: Not on file    Active member of club or organization: Not on file    Attends meetings of clubs or organizations: Not on  file    Relationship status: Not on file  . Intimate partner violence:    Fear of current or ex partner: Not on file    Emotionally abused: Not on file    Physically abused: Not on file    Forced sexual activity: Not on file  Other Topics Concern  . Not on file  Social History Narrative  . Not on file   Family History  Problem Relation Age of Onset  . Autism Son   . Diabetes Maternal Grandmother   . Stroke Maternal Grandmother   . Heart failure Maternal Grandmother   . Stroke Maternal Grandfather   . Stroke Father   . Seizures Father   . Other Mother        herpes  . Heart failure Paternal Grandfather   . Diabetes Paternal Grandmother   . Hypertension Paternal Grandmother   . Autism Brother   . Other Paternal Uncle        heart exploded  . Cancer Maternal Aunt   . Diabetes Maternal Aunt   . Hypertension Maternal Aunt     Objective: Office vital signs reviewed. BP 103/67   Pulse 73   Temp 98 F (36.7 C) (Oral)   Ht 5\' 2"  (1.575 m)   Wt 129 lb 9.6 oz (58.8 kg)   BMI 23.70 kg/m   Physical Examination:  General: Awake, alert, well nourished, nontoxic. No acute distress HEENT:  Sclera white, MMM Pulm: normal work of breathing on room air GI: soft, mild TTP in RLQ, no peritoneal signs, non-distended, bowel sounds present x4, no hepatomegaly, no splenomegaly, no masses GU: no suprapubic TTP Extremities: warm, well perfused, No edema, cyanosis or clubbing; +2 pulses bilaterally MSK: normal gait and normal station  Results for orders placed or performed in visit on 03/21/18 (from the past 24 hour(s))  Urinalysis, Complete     Status: Abnormal   Collection Time: 03/21/18 11:36 AM  Result Value Ref Range   Specific Gravity, UA 1.010 1.005 - 1.030   pH, UA 6.0 5.0 - 7.5   Color, UA Yellow Yellow   Appearance Ur Clear Clear   Leukocytes, UA 1+ (A) Negative   Protein, UA Negative Negative/Trace   Glucose, UA Negative Negative   Ketones, UA Negative Negative   RBC,  UA 1+ (A) Negative   Bilirubin, UA Negative Negative   Urobilinogen, Ur 0.2 0.2 - 1.0 mg/dL   Nitrite, UA Negative Negative   Microscopic Examination See below:    Narrative   Performed at:  [facility] [address], [place], [place]  [id number] Lab Director: Colletta [place] [facility], Phone:  [phone]  Microscopic Examination     Status: Abnormal  Collection Time: 03/21/18 11:36 AM  Result Value Ref Range   WBC, UA 6-10 (A) 0 - 5 /hpf   RBC, UA 3-10 (A) 0 - 2 /hpf   Epithelial Cells (non renal) >10 (A) 0 - 10 /hpf   Renal Epithel, UA None seen None seen /hpf   Bacteria, UA Few None seen/Few   Narrative   Performed at:  [address], [place], [place]  [id number] Lab Director: Colletta Maryland [facility], Phone:  [phone]    Assessment/ Plan: 32 y.o. female   1. Diarrhea, unspecified type Patient is afebrile nontoxic-appearing.  No evidence of dehydration on exam.  She does have mild tenderness to palpation to the right lower quadrant.  Again, she has had an appendectomy and cholecystectomy.  She demonstrated no red flag signs of acute abdomen.  I suspect that this is likely viral in nature but patient does want to have stool studies performed.  Therefore, stool pathogen panel ordered.  Patient given instructions and recommended to return this as soon as possible.  Reasons for return and emergent evaluation emergency department discussed.  Patient was good understanding. - Cdiff NAA+O+P+Stool Culture  2. Urinary frequency Urinalysis remarkable for 1+ leukocytes and 1+ blood.  Urine microscopy with 6-10 white blood cells, 3-10 red blood cells greater than 10 epithelial cells and few bacteria.  I sent this for urine culture.  Upcoming menstrual cycle may be confusing the picture.  Given her symptoms, I have gone ahead and placed on Keflex 500 mg p.o. twice daily for the next 5 days.  Continue hydration.  Work note provided.  She is to follow-up PRN. -  Urinalysis, Complete - Urine Culture   Orders Placed This Encounter  Procedures  . Cdiff NAA+O+P+Stool Culture  . Microscopic Examination  . Urine Culture  . Urinalysis, Complete   Meds ordered this encounter  Medications  . cephALEXin (KEFLEX) 500 MG capsule    Sig: Take 1 capsule (500 mg total) by mouth 2 (two) times daily for 5 days.    Dispense:  10 capsule    Refill:  [place], DO [place] [phone]

## 2018-03-22 ENCOUNTER — Encounter: Payer: Self-pay | Admitting: Family Medicine

## 2018-03-23 LAB — URINE CULTURE

## 2018-03-26 LAB — CDIFF NAA+O+P+STOOL CULTURE
CDIFFPCR: NEGATIVE
E COLI SHIGA TOXIN ASSAY: NEGATIVE

## 2018-04-14 ENCOUNTER — Encounter: Payer: Self-pay | Admitting: *Deleted

## 2018-04-14 ENCOUNTER — Encounter: Payer: Self-pay | Admitting: Family Medicine

## 2018-04-14 ENCOUNTER — Ambulatory Visit (INDEPENDENT_AMBULATORY_CARE_PROVIDER_SITE_OTHER): Payer: Medicaid Other | Admitting: Family Medicine

## 2018-04-14 VITALS — BP 107/65 | HR 69 | Temp 97.7°F | Ht 62.0 in | Wt 128.0 lb

## 2018-04-14 DIAGNOSIS — J020 Streptococcal pharyngitis: Secondary | ICD-10-CM

## 2018-04-14 DIAGNOSIS — J029 Acute pharyngitis, unspecified: Secondary | ICD-10-CM | POA: Diagnosis not present

## 2018-04-14 LAB — RAPID STREP SCREEN (MED CTR MEBANE ONLY): Strep Gp A Ag, IA W/Reflex: POSITIVE — AB

## 2018-04-14 MED ORDER — AMOXICILLIN-POT CLAVULANATE 875-125 MG PO TABS: 1.0000 | ORAL_TABLET | Freq: Two times a day (BID) | ORAL | 0 refills | Status: DC

## 2018-04-14 NOTE — Progress Notes (Signed)
Chief Complaint  Patient presents with  . Sore Throat    since SUN night  . Cough    x 1 week    HPI  Patient presents today for Patient presents with upper respiratory congestion. Rhinorrhea that is frequently purulent. There is moderate sore throat that started last night. Patient reports coughing frequently for a week.   No sputum noted. There is no fever, chills or sweats. The patient denies being short of breath. PMH: Smoking status noted ROS: Per HPI  Objective: BP 107/65 (BP Location: Left Arm)   Pulse 69   Temp 97.7 F (36.5 C) (Oral)   Ht 5\' 2"  (1.575 m)   Wt 128 lb (58.1 kg)   BMI 23.41 kg/m  Gen: NAD, alert, cooperative with exam HEENT: NCAT, Nasal passages swollen, red TMS RED posterior pharynx red. Tonsils not enlarged CV: RRR, good S1/S2, no murmur Resp: CTA Ext: No edema, warm Neuro: Alert and oriented, No gross deficits  Assessment and plan:  1. Strep pharyngitis     Meds ordered this encounter  Medications  . amoxicillin-clavulanate (AUGMENTIN) 875-125 MG tablet    Sig: Take 1 tablet by mouth 2 (two) times daily.    Dispense:  20 tablet    Refill:  0    Orders Placed This Encounter  Procedures  . Rapid Strep Screen ([facility] ONLY)    Follow up as needed.  Claretta Fraise, MD

## 2018-05-06 ENCOUNTER — Ambulatory Visit (INDEPENDENT_AMBULATORY_CARE_PROVIDER_SITE_OTHER): Payer: Medicaid Other | Admitting: Family Medicine

## 2018-05-06 ENCOUNTER — Encounter: Payer: Self-pay | Admitting: Family Medicine

## 2018-05-06 VITALS — BP 107/65 | HR 71 | Temp 98.2°F | Ht 62.0 in | Wt 128.4 lb

## 2018-05-06 DIAGNOSIS — A084 Viral intestinal infection, unspecified: Secondary | ICD-10-CM

## 2018-05-06 NOTE — Progress Notes (Signed)
Cc: diarrhea  HPI: 32 y.o female with past medical history of tubal ligation, cholecystectomy, appendectomy, and irregular menstrual bleeding presents today for diarrhea with a low grade fever. Patient complaines of episodes of diarrhea since August that last for 3-4 days and are accompanied by abdominal pain in the lower quadrants. Pain is described as sharp and radiating toward the midline and is relieved by the diarrhea. Episodes occur about every 3 weeks and there is no triggering event. Patient has been seen before for the same complaint, had stool testing, and results were normal. 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 Patient has tried imodium in the past, but has not taken it recently due to constipation. Patient has irregular periods, but reports no pain with periods. When patient was seen in August she had UTI, was treated, and is not having symptoms today. She denies and blood or mucous in the diarrhea. She denies constipation or vomiting. She denies family history of IBS, celiac dz, or lactose intolerance. She has not started any new medications.   Surgical history: tubal ligation, cholecystectomy, appendectomy  ROS: Constitutional: +fever,-chills, - weight loss/weight gain HEENT: +occasional headaches, -congestion  CV: - chest pain, - palpitations Pulm: -sob, - cough GI: +diarrhea, -blood/mucous in stools, - constipation, +nauseua, -vomiting, +abdominal pain Lower quadrants bilaterally GU: +irregular periods,-dysmenorrhea,  -dysuria, -frequency  PE: Vitals: 107/65, 71, 98.2 degrees F CV: normal S1 an S2 with no murmurs, rubs, or gallops Pulm: CTA bilaterally Abdomen: normal Bowel sounds in all 4 quadrants, neg TTP  GU:neg CVA tenderness Extremities: -bilateral leg edema. Pulses 2+ equal bilaterally  No labs or imaging were ordered  A&P: Viral gastroenteritis Patient presents with episodes of diarrhea and abdominal pain since August that last 3-4 days with no  triggering event. 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 Patient had previous stool work-up which was normal. Today patient woke up with low grade fever and diarrhea. Diarrhea and pain likely due to viral etiology, possibly from new school environment. Recommended patient take imodium tablets, 2 after the first episode, then as needed. Will give patient a work note stating she can go back to work after 24 hours of no diarrhea.   Problem List Items Addressed This Visit    None    Visit Diagnoses    Viral gastroenteritis    -  Primary       Patient seen and examined with Cadence Kathlen Mody PA student.  Agree with assessment and plan above.  Recommended for her to treat symptomatically still, likely due to exposure and being her first year working in the place where she is exposed.  Recommended handwashing and Imodium and to call us back if she notices any blood or fevers above 101. Caryl Pina, MD [facility] 05/06/2018, 1:35 PM

## 2018-06-30 ENCOUNTER — Encounter: Payer: Self-pay | Admitting: Family Medicine

## 2018-07-10 ENCOUNTER — Ambulatory Visit: Payer: Medicaid Other | Admitting: Family Medicine

## 2018-07-11 ENCOUNTER — Encounter: Payer: Self-pay | Admitting: Family Medicine

## 2018-07-11 ENCOUNTER — Ambulatory Visit (INDEPENDENT_AMBULATORY_CARE_PROVIDER_SITE_OTHER): Payer: Medicaid Other | Admitting: Family Medicine

## 2018-07-11 VITALS — BP 115/75 | HR 90 | Temp 102.2°F | Ht 62.0 in | Wt 128.0 lb

## 2018-07-11 DIAGNOSIS — J029 Acute pharyngitis, unspecified: Secondary | ICD-10-CM | POA: Diagnosis not present

## 2018-07-11 DIAGNOSIS — J101 Influenza due to other identified influenza virus with other respiratory manifestations: Secondary | ICD-10-CM | POA: Diagnosis not present

## 2018-07-11 LAB — VERITOR FLU A/B WAIVED
INFLUENZA B: POSITIVE — AB
Influenza A: NEGATIVE

## 2018-07-11 LAB — CULTURE, GROUP A STREP

## 2018-07-11 LAB — RAPID STREP SCREEN (MED CTR MEBANE ONLY): STREP GP A AG, IA W/REFLEX: NEGATIVE

## 2018-07-11 MED ORDER — OSELTAMIVIR PHOSPHATE 75 MG PO CAPS: 75.0000 mg | ORAL_CAPSULE | Freq: Two times a day (BID) | ORAL | 0 refills | Status: DC

## 2018-07-11 NOTE — Progress Notes (Signed)
BP 115/75   Pulse 90   Temp (!) 102.2 F (39 C) (Oral)   Ht 5\' 2"  (1.575 m)   Wt 128 lb (58.1 kg)   BMI 23.41 kg/m    Subjective:    Patient ID: Jaime Flores, female    DOB: [birth year], 32 y.o.   MRN: [id number]  HPI: Jaime Flores is a 32 y.o. female presenting on 07/11/2018 for Influenza (body aches  fever  nauseous  sore throat)   HPI Fever and body aches and congestion and sore throat Patient comes in complaining of fever and body aches and congestion and sore throat this been going on since yesterday.  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  She has been having a lot of coughing and sore throat and the fever and body aches and her fever has been as high as 102 which is here in office as well.  She has been taking ibuprofen and Tylenol to help bring it down but she is coming in to get checked for flu or strep  Relevant past medical, surgical, family and social history reviewed and updated as indicated. Interim medical history since our last visit reviewed. Allergies and medications reviewed and updated.  Review of Systems  Constitutional: Positive for chills and fever.  HENT: Positive for congestion, postnasal drip, rhinorrhea, sinus pressure, sneezing and sore throat. Negative for ear discharge and ear pain.   Eyes: Negative for pain, redness and visual disturbance.  Respiratory: Positive for cough. Negative for chest tightness and shortness of breath.   Cardiovascular: Negative for chest pain and leg swelling.  Genitourinary: Negative for difficulty urinating and dysuria.  Musculoskeletal: Positive for myalgias. Negative for back pain and gait problem.  Skin: Negative for rash.  Neurological: Negative for light-headedness and headaches.  Psychiatric/Behavioral: Negative for agitation and behavioral problems.  All other systems reviewed and are  negative.   Per HPI unless specifically indicated above   Allergies as of 07/11/2018      Reactions   Doxycycline Shortness Of Breath   Wellbutrin [bupropion] Shortness Of Breath, Nausea Only, Palpitations      Medication List       Accurate as of July 11, 2018  6:23 PM. Always use your most recent med list.        cyclobenzaprine 10 MG tablet Commonly known as:  FLEXERIL Take 1 tablet (10 mg total) by mouth 3 (three) times daily as needed for muscle spasms.   megestrol 40 MG tablet Commonly known as:  MEGACE Take 1 tablet (40 mg total) by mouth daily.   oseltamivir 75 MG capsule Commonly known as:  TAMIFLU Take 1 capsule (75 mg total) by mouth 2 (two) times daily.   ranitidine 150 MG tablet Commonly known as:  ZANTAC Take 1 tablet (150 mg total) by mouth 2 (two) times daily.          Objective:    BP 115/75   Pulse 90   Temp (!) 102.2 F (39 C) (Oral)   Ht 5\' 2"  (1.575 m)   Wt 128 lb (58.1 kg)   BMI 23.41 kg/m   Wt Readings from Last 3 Encounters:  07/11/18 128 lb (58.1 kg)  05/06/18 128 lb 6.4 oz (58.2 kg)  04/14/18 128 lb (58.1 kg)    Physical Exam Vitals signs and nursing note reviewed.  Constitutional:  General: She is not in acute distress.    Appearance: She is well-developed. She is not diaphoretic.  HENT:     Right Ear: Tympanic membrane, ear canal and external ear normal.     Left Ear: Tympanic membrane, ear canal and external ear normal.     Nose: Mucosal edema and rhinorrhea present.     Right Sinus: No maxillary sinus tenderness or frontal sinus tenderness.     Left Sinus: No maxillary sinus tenderness or frontal sinus tenderness.     Mouth/Throat:     Pharynx: Uvula midline. Posterior oropharyngeal erythema present. No oropharyngeal exudate.     Tonsils: No tonsillar abscesses.  Eyes:     Conjunctiva/sclera: Conjunctivae normal.  Cardiovascular:     Rate and Rhythm: Normal rate and regular rhythm.     Heart sounds: Normal  heart sounds. No murmur.  Pulmonary:     Effort: Pulmonary effort is normal. No respiratory distress.     Breath sounds: Normal breath sounds. No wheezing.  Musculoskeletal: Normal range of motion.        General: No tenderness.  Skin:    General: Skin is warm and dry.     Findings: No rash.  Neurological:     Mental Status: She is alert and oriented to person, place, and time.     Coordination: Coordination normal.  Psychiatric:        Behavior: Behavior normal.     Rapid flu positive for B, rapid strep negative    Assessment & Plan:   Problem List Items Addressed This Visit    None    Visit Diagnoses    Sore throat    -  Primary   Relevant Orders   Rapid Strep Screen ([facility] ONLY)   Influenza B       Relevant Medications   oseltamivir (TAMIFLU) 75 MG capsule   Other Relevant Orders   Veritor Flu A/B Waived       Follow up plan: Return if symptoms worsen or fail to improve.  Counseling provided for all of the vaccine components Orders Placed This Encounter  Procedures  . Rapid Strep Screen ([facility] ONLY)  . Veritor Flu A/B [place], MD [facility] 07/11/2018, 6:23 PM

## 2018-07-24 ENCOUNTER — Encounter: Payer: Self-pay | Admitting: Family Medicine

## 2018-07-24 ENCOUNTER — Ambulatory Visit (INDEPENDENT_AMBULATORY_CARE_PROVIDER_SITE_OTHER): Payer: Medicaid Other | Admitting: Family Medicine

## 2018-07-24 VITALS — BP 106/58 | HR 82 | Temp 97.8°F | Ht 62.0 in | Wt 126.0 lb

## 2018-07-24 DIAGNOSIS — F419 Anxiety disorder, unspecified: Secondary | ICD-10-CM | POA: Diagnosis not present

## 2018-07-24 DIAGNOSIS — L03213 Periorbital cellulitis: Secondary | ICD-10-CM

## 2018-07-24 MED ORDER — CITALOPRAM HYDROBROMIDE 20 MG PO TABS: 20.0000 mg | ORAL_TABLET | Freq: Every day | ORAL | 1 refills | Status: DC

## 2018-07-24 MED ORDER — SULFAMETHOXAZOLE-TRIMETHOPRIM 800-160 MG PO TABS: 1.0000 | ORAL_TABLET | Freq: Two times a day (BID) | ORAL | 0 refills | Status: DC

## 2018-07-24 NOTE — Progress Notes (Signed)
BP (!) 106/58   Pulse 82   Temp 97.8 F (36.6 C) (Oral)   Ht 5\' 2"  (1.575 m)   Wt 126 lb (57.2 kg)   BMI 23.05 kg/m    Subjective:    Patient ID: Jaime Flores, female    DOB: 30-Oct-1985, 33 y.o.   MRN: [id number]  HPI: Jaime Flores is a 33 y.o. female presenting on 07/24/2018 for Small lesion above right eye   HPI Right eye swelling and redness Patient started out with a small pimple on the lateral aspect of her right eye that turned into swelling and redness above her eye.  She denies any fevers or chills or pain with eye movements.  She denies any blurred vision or focus issues.  She said it just started a few days ago and then has progressed from there.  Anxiety and irritability Patient says she has been having some anxiety and irritability that has increased over the past couple months and she gets easily angered at both her children and her boyfriend and she does not like that she does not like being like that.  She says her sexual drive is coming down and she at night is having trouble falling asleep to where she will be up sometimes laying in bed until 3 AM and finally fall asleep then.  She denies any major depression or suicidal ideations or thoughts of hurting herself. Depression screen [facility] 2/9 05/06/2018 04/14/2018 03/21/2018 12/09/2017 10/28/2017  Decreased Interest 0 0 0 0 0  Down, Depressed, Hopeless 0 0 0 0 0  PHQ - 2 Score 0 0 0 0 0  Altered sleeping - - - - -  Tired, decreased energy - - - - -  Change in appetite - - - - -  Feeling bad or failure about yourself  - - - - -  Trouble concentrating - - - - -  Moving slowly or fidgety/restless - - - - -  Suicidal thoughts - - - - -  PHQ-9 Score - - - - -     Relevant past medical, surgical, family and social history reviewed and updated as indicated. Interim medical history since our last visit reviewed. Allergies and medications reviewed and updated.  Review of Systems  Constitutional: Negative for chills  and fever.  Eyes: Negative for pain, discharge, redness and visual disturbance.  Respiratory: Negative for chest tightness and shortness of breath.   Cardiovascular: Negative for chest pain and leg swelling.  Musculoskeletal: Negative for back pain and gait problem.  Skin: Positive for color change. Negative for rash.  Neurological: Negative for light-headedness and headaches.  Psychiatric/Behavioral: Negative for agitation, behavioral problems, decreased concentration, dysphoric mood, self-injury and suicidal ideas. The patient is nervous/anxious.   All other systems reviewed and are negative.   Per HPI unless specifically indicated above   Allergies as of 07/24/2018      Reactions   Doxycycline Shortness Of Breath   Wellbutrin [bupropion] Shortness Of Breath, Nausea Only, Palpitations      Medication List       Accurate as of July 24, 2018  6:53 PM. Always use your most recent med list.        citalopram 20 MG tablet Commonly known as:  CELEXA Take 1 tablet (20 mg total) by mouth daily.   cyclobenzaprine 10 MG tablet Commonly known as:  FLEXERIL Take 1 tablet (10 mg total) by mouth 3 (three) times daily as needed for muscle spasms.   megestrol  40 MG tablet Commonly known as:  MEGACE Take 1 tablet (40 mg total) by mouth daily.   oseltamivir 75 MG capsule Commonly known as:  TAMIFLU Take 1 capsule (75 mg total) by mouth 2 (two) times daily.   ranitidine 150 MG tablet Commonly known as:  ZANTAC Take 1 tablet (150 mg total) by mouth 2 (two) times daily.   sulfamethoxazole-trimethoprim 800-160 MG tablet Commonly known as:  BACTRIM DS,SEPTRA DS Take 1 tablet by mouth 2 (two) times daily.          Objective:    BP (!) 106/58   Pulse 82   Temp 97.8 F (36.6 C) (Oral)   Ht 5\' 2"  (1.575 m)   Wt 126 lb (57.2 kg)   BMI 23.05 kg/m   Wt Readings from Last 3 Encounters:  07/24/18 126 lb (57.2 kg)  07/11/18 128 lb (58.1 kg)  05/06/18 128 lb 6.4 oz (58.2 kg)      Physical Exam Vitals signs and nursing note reviewed.  Constitutional:      General: She is not in acute distress.    Appearance: She is well-developed. She is not diaphoretic.  Eyes:     Extraocular Movements: Extraocular movements intact.     Conjunctiva/sclera: Conjunctivae normal.     Pupils: Pupils are equal, round, and reactive to light.  Cardiovascular:     Rate and Rhythm: Normal rate and regular rhythm.     Heart sounds: Normal heart sounds. No murmur.  Pulmonary:     Effort: Pulmonary effort is normal. No respiratory distress.     Breath sounds: Normal breath sounds. No wheezing.  Musculoskeletal: Normal range of motion.        General: No tenderness.  Skin:    General: Skin is warm and dry.     Findings: Erythema (Swelling and erythema above the right eye in the eyelid.  No pain with range of motion,) present. No rash.  Neurological:     Mental Status: She is alert and oriented to person, place, and time.     Coordination: Coordination normal.  Psychiatric:        Mood and Affect: Mood is anxious. Mood is not depressed.        Behavior: Behavior normal.        Thought Content: Thought content does not include suicidal ideation. Thought content does not include suicidal plan.       Assessment & Plan:   Problem List Items Addressed This Visit    None    Visit Diagnoses    Preseptal cellulitis of right eye    -  Primary   Relevant Medications   sulfamethoxazole-trimethoprim (BACTRIM DS,SEPTRA DS) 800-160 MG tablet   Anxiety       Relevant Medications   citalopram (CELEXA) 20 MG tablet       Follow up plan: Return if symptoms worsen or fail to improve.  Counseling provided for all of the vaccine components No orders of the defined types were placed in this encounter.   Caryl Pina, MD [facility] 07/24/2018, 6:53 PM

## 2018-08-14 ENCOUNTER — Encounter: Payer: Self-pay | Admitting: Family Medicine

## 2018-08-14 ENCOUNTER — Ambulatory Visit: Payer: Medicaid Other | Admitting: Family Medicine

## 2018-08-14 VITALS — BP 102/63 | HR 69 | Temp 97.4°F | Ht 62.0 in | Wt 128.2 lb

## 2018-08-14 DIAGNOSIS — D485 Neoplasm of uncertain behavior of skin: Secondary | ICD-10-CM | POA: Diagnosis not present

## 2018-08-14 DIAGNOSIS — L989 Disorder of the skin and subcutaneous tissue, unspecified: Secondary | ICD-10-CM

## 2018-08-14 NOTE — Progress Notes (Signed)
BP 102/63   Pulse 69   Temp (!) 97.4 F (36.3 C) (Oral)   Ht 5\' 2"  (1.575 m)   Wt 128 lb 3.2 oz (58.2 kg)   BMI 23.45 kg/m    Subjective:    Patient ID: Jaime Flores, female    DOB: 22-Jul-1986, 33 y.o.   MRN: [id number]  HPI: Jaime Flores is a 33 y.o. female presenting on 08/14/2018 for shaved byopsy (right arm and left leg)   HPI Irritated skin lesions Patient has 1 skin lesion on right arm and one on left upper thigh that have been there for some time and she will catch them or irritate them especially when she is shaving and then they will bleed a lot.  They get irritated sometimes.  The one on the leg is slightly larger and is probably the one that irritates her the most but she would like to have them both removed.  She denies any redness or warmth or drainage from them but just the irritation and that they catch when she shaves.  She has had them for a few years and cannot remember for how long.  Relevant past medical, surgical, family and social history reviewed and updated as indicated. Interim medical history since our last visit reviewed. Allergies and medications reviewed and updated.  Review of Systems  Constitutional: Negative for chills and fever.  Eyes: Negative for visual disturbance.  Respiratory: Negative for chest tightness and shortness of breath.   Cardiovascular: Negative for chest pain and leg swelling.  Musculoskeletal: Negative for back pain and gait problem.  Skin: Negative for rash.  Neurological: Negative for light-headedness and headaches.  Psychiatric/Behavioral: Negative for agitation and behavioral problems.  All other systems reviewed and are negative.   Per HPI unless specifically indicated above   Allergies as of 08/14/2018      Reactions   Doxycycline Shortness Of Breath   Wellbutrin [bupropion] Shortness Of Breath, Nausea Only, Palpitations      Medication List       Accurate as of August 14, 2018 10:50 AM. Always use  your most recent med list.        citalopram 20 MG tablet Commonly known as:  CELEXA Take 1 tablet (20 mg total) by mouth daily.   cyclobenzaprine 10 MG tablet Commonly known as:  FLEXERIL Take 1 tablet (10 mg total) by mouth 3 (three) times daily as needed for muscle spasms.   megestrol 40 MG tablet Commonly known as:  MEGACE Take 1 tablet (40 mg total) by mouth daily.   ranitidine 150 MG tablet Commonly known as:  ZANTAC Take 1 tablet (150 mg total) by mouth 2 (two) times daily.          Objective:    BP 102/63   Pulse 69   Temp (!) 97.4 F (36.3 C) (Oral)   Ht 5\' 2"  (1.575 m)   Wt 128 lb 3.2 oz (58.2 kg)   BMI 23.45 kg/m   Wt Readings from Last 3 Encounters:  08/14/18 128 lb 3.2 oz (58.2 kg)  07/24/18 126 lb (57.2 kg)  07/11/18 128 lb (58.1 kg)    Physical Exam Vitals signs and nursing note reviewed.  Constitutional:      General: She is not in acute distress.    Appearance: She is well-developed. She is not diaphoretic.  Eyes:     Conjunctiva/sclera: Conjunctivae normal.  Cardiovascular:     Rate and Rhythm: Normal rate and regular rhythm.  Heart sounds: Normal heart sounds. No murmur.  Pulmonary:     Effort: Pulmonary effort is normal. No respiratory distress.     Breath sounds: Normal breath sounds. No wheezing.  Skin:    General: Skin is warm and dry.     Findings: Lesion present. No rash.       Neurological:     Mental Status: She is alert and oriented to person, place, and time.     Skin lesion removal x2: Verbal consent was obtained.  Betadine was used for cleansing.  3cc of 2% lidocaine without epinephrine was used for anesthesia.  Shave biopsy was performed with good margins.  Nitrate was used to achieve hemostasis.  Topical antibiotic was used and then it was covered by 3x 3 and bandage told in place. Procedure was tolerated well.  Bleeding was minimal      Assessment & Plan:   Problem List Items Addressed This Visit    None      Visit Diagnoses    Skin lesion    -  Primary   Patient has 1 skin lesion on her left leg that appears to be a keloid that she constantly shaves and hits it and it bleeds and she wants it removed   Relevant Orders   Pathology (Completed)   Pathology      Patient has a second skin lesion on her right anterior forearm that is smaller but also has the same appearance that she bumps and hits and is irritated at times. Follow up plan: Return if symptoms worsen or fail to improve.  Counseling provided for all of the vaccine components No orders of the defined types were placed in this encounter.   Caryl Pina, MD [facility] 08/14/2018, 10:50 AM

## 2018-08-15 ENCOUNTER — Telehealth: Payer: Self-pay | Admitting: Family Medicine

## 2018-08-15 NOTE — Telephone Encounter (Signed)
Pt states she had 2 shave biopsies yesterday. She has already started to put Hydrocortisone on the biopsy place on her leg. Advised pt to stop doing that and not to put anything on the area where she had the biopsy as that can cause rebound tenderness and irritation. She should keep the wound clean and cover with gauze or band aid. Pt states the site isn't red to the touch or draining. Pt will watch for signs of infection.

## 2018-08-16 ENCOUNTER — Other Ambulatory Visit: Payer: Self-pay | Admitting: Nurse Practitioner

## 2018-08-16 ENCOUNTER — Encounter: Payer: Self-pay | Admitting: Family Medicine

## 2018-08-16 MED ORDER — SULFAMETHOXAZOLE-TRIMETHOPRIM 800-160 MG PO TABS: 1.0000 | ORAL_TABLET | Freq: Two times a day (BID) | ORAL | 0 refills | Status: DC

## 2018-08-18 LAB — PATHOLOGY

## 2018-08-18 MED ORDER — SULFAMETHOXAZOLE-TRIMETHOPRIM 800-160 MG PO TABS: 1.0000 | ORAL_TABLET | Freq: Two times a day (BID) | ORAL | 0 refills | Status: DC

## 2018-08-19 LAB — PATHOLOGY

## 2018-08-20 ENCOUNTER — Encounter: Payer: Self-pay | Admitting: Family Medicine

## 2018-08-20 NOTE — Progress Notes (Signed)
Patient calls reporting hives ok neck and arm.  No know exposures.  No shortness of breath or oral swelling.  Recommend Benadryl 50mg  P.O. now.  Emergent precautions reviewed.  She voiced good understanding.

## 2018-08-26 ENCOUNTER — Encounter: Payer: Self-pay | Admitting: Family Medicine

## 2018-09-08 ENCOUNTER — Other Ambulatory Visit: Payer: Medicaid Other | Admitting: Adult Health

## 2018-09-08 ENCOUNTER — Telehealth: Payer: Self-pay | Admitting: Family Medicine

## 2018-09-08 NOTE — Telephone Encounter (Signed)
Returned patient's phone call.  Patient states that for the past week she has had intermittent headaches ranging from dull to throbbing.  Patient is wondering if this could be a side effect from celexa.

## 2018-09-10 ENCOUNTER — Encounter: Payer: Self-pay | Admitting: Family Medicine

## 2018-09-12 ENCOUNTER — Ambulatory Visit: Payer: Medicaid Other | Admitting: Family Medicine

## 2018-09-14 ENCOUNTER — Other Ambulatory Visit: Payer: Self-pay

## 2018-09-14 ENCOUNTER — Encounter (HOSPITAL_COMMUNITY): Payer: Self-pay | Admitting: Emergency Medicine

## 2018-09-14 ENCOUNTER — Emergency Department (HOSPITAL_COMMUNITY)
Admission: EM | Admit: 2018-09-14 | Discharge: 2018-09-14 | Disposition: A | Discharge status: Home / Self Care | Payer: Medicaid Other | Attending: Emergency Medicine | Admitting: Emergency Medicine

## 2018-09-14 DIAGNOSIS — T7840XA Allergy, unspecified, initial encounter: Secondary | ICD-10-CM | POA: Diagnosis not present

## 2018-09-14 DIAGNOSIS — F17210 Nicotine dependence, cigarettes, uncomplicated: Secondary | ICD-10-CM | POA: Diagnosis not present

## 2018-09-14 DIAGNOSIS — R0789 Other chest pain: Secondary | ICD-10-CM | POA: Diagnosis not present

## 2018-09-14 DIAGNOSIS — R21 Rash and other nonspecific skin eruption: Secondary | ICD-10-CM | POA: Diagnosis present

## 2018-09-14 DIAGNOSIS — T781XXA Other adverse food reactions, not elsewhere classified, initial encounter: Secondary | ICD-10-CM | POA: Insufficient documentation

## 2018-09-14 DIAGNOSIS — Z79899 Other long term (current) drug therapy: Secondary | ICD-10-CM | POA: Diagnosis not present

## 2018-09-14 MED ORDER — FAMOTIDINE IN NACL 20-0.9 MG/50ML-% IV SOLN
20.0000 mg | Freq: Once | INTRAVENOUS | Status: AC
  Administered 2018-09-14: 20 mg via INTRAVENOUS
  Filled 2018-09-14: qty 50

## 2018-09-14 MED ORDER — PREDNISONE 20 MG PO TABS: 40.0000 mg | ORAL_TABLET | Freq: Every day | ORAL | 0 refills | Status: DC

## 2018-09-14 MED ORDER — METHYLPREDNISOLONE SODIUM SUCC 125 MG IJ SOLR
125.0000 mg | Freq: Once | INTRAMUSCULAR | Status: AC
  Administered 2018-09-14: 125 mg via INTRAVENOUS
  Filled 2018-09-14: qty 2

## 2018-09-14 NOTE — Discharge Instructions (Addendum)
Start the prednisone prescription tomorrow.  Continue taking your Benadryl 1 capsule every 4-6 hours for least 2 to 3 days.  Keep your appointment with your PCP tomorrow.  Return to the ER for any worsening symptoms.

## 2018-09-14 NOTE — ED Notes (Signed)
Pt reports eating at [employer] 2 weeks ago and breaking out in rash with itching and discomfort  Today ate chicken sandwich at [facility] and now has a area of raised reddened area to bilateral wrist  She has taken benadryl 50 mg and called the nurse on call for [facility] and was told to come to ED

## 2018-09-14 NOTE — ED Provider Notes (Signed)
[facility] EMERGENCY DEPARTMENT Provider Note   CSN: [id number] Arrival date & time: 09/14/18  2040    History   Chief Complaint Chief Complaint  Patient presents with  . Allergic Reaction    HPI Jaime Flores is a 33 y.o. female.     HPI  Jaime Flores is a 33 y.o. female who presents to the Emergency Department complaining of a rash to both forearms and tightness of her throat that began earlier today after eating grilled chicken.  States this was from a [employer].  She began having symptoms shortly after eating.  She contacted the nurse line with her PCPs office and was advised to come to the emergency room for further evaluation.  She took 2 Benadryl capsules prior to arrival.  Reports some improvement of the rash since taking the Benadryl, but continues to have a "tightness" feeling to her throat.  She does not have difficulty swallowing her saliva.  She denies facial swelling, swelling of her lips or tongue, fever, chills, chest pain, or shortness of breath.  No history of anaphylactic reactions.  She also states that she was prescribed Celexa for anxiety and she has been taking the medication for over 1 month, but denies any other new medications.  No chemical exposures.    Past Medical History:  Diagnosis Date  . Amenorrhea 01/27/2013  . Breast pain 03/10/2015  . Encounter for menstrual regulation 07/05/2015  . Irregular intermenstrual bleeding 07/28/2015  . Irregular periods 07/05/2015  . Screening for STD (sexually transmitted disease) 08/07/2013  . UTI (urinary tract infection)   . Vaginal discharge 06/10/2013    Patient Active Problem List   Diagnosis Date Noted  . Hemorrhoids 09/26/2016  . Pregnancy examination or test, negative result 06/29/2016  . Irregular intermenstrual bleeding 07/28/2015  . Amenorrhea 01/27/2013    Past Surgical History:  Procedure Laterality Date  . APPENDECTOMY    . CHOLECYSTECTOMY    . TUBAL LIGATION       OB  History    Gravida  2   Para  2   Term  1   Preterm  1   AB      Living  2     SAB      TAB      Ectopic      Multiple      Live Births               Home Medications    Prior to Admission medications   Medication Sig Start Date End Date Taking? Authorizing Provider  citalopram (CELEXA) 20 MG tablet Take 1 tablet (20 mg total) by mouth daily. 07/24/18   [name], Fransisca Kaufmann, MD  cyclobenzaprine (FLEXERIL) 10 MG tablet Take 1 tablet (10 mg total) by mouth 3 (three) times daily as needed for muscle spasms. 10/28/17   [name], Fransisca Kaufmann, MD  megestrol (MEGACE) 40 MG tablet Take 1 tablet (40 mg total) by mouth daily. 01/02/18   Estill Dooms, NP  ranitidine (ZANTAC) 150 MG tablet Take 1 tablet (150 mg total) by mouth 2 (two) times daily. 12/09/17   [name], Fransisca Kaufmann, MD  sulfamethoxazole-trimethoprim (BACTRIM DS,SEPTRA DS) 800-160 MG tablet Take 1 tablet by mouth 2 (two) times daily. 08/18/18   [name], Fransisca Kaufmann, MD    Family History Family History  Problem Relation Age of Onset  . Autism Son   . Diabetes Maternal Grandmother   . Stroke Maternal Grandmother   . Heart failure  Maternal Grandmother   . Stroke Maternal Grandfather   . Stroke Father   . Seizures Father   . Other Mother        herpes  . Heart failure Paternal Grandfather   . Diabetes Paternal Grandmother   . Hypertension Paternal Grandmother   . Autism Brother   . Other Paternal Uncle        heart exploded  . Cancer Maternal Aunt   . Diabetes Maternal Aunt   . Hypertension Maternal Aunt     Social History Social History   Tobacco Use  . Smoking status: Current Every Day Smoker    Packs/day: 0.50    Years: 8.00    Pack years: 4.00    Types: Cigarettes  . Smokeless tobacco: Never Used  Substance Use Topics  . Alcohol use: No  . Drug use: No     Allergies   Doxycycline; Wellbutrin [bupropion]; and Tape   Review of Systems Review of Systems  Constitutional: Negative for  activity change, appetite change, chills and fever.  HENT: Positive for trouble swallowing. Negative for facial swelling and sore throat.        "Tightness" of her throat  Eyes: Negative for visual disturbance.  Respiratory: Negative for cough, chest tightness, shortness of breath and wheezing.   Cardiovascular: Negative for chest pain.  Gastrointestinal: Negative for abdominal pain, nausea and vomiting.  Musculoskeletal: Negative for neck pain and neck stiffness.  Skin: Positive for rash. Negative for wound.  Neurological: Negative for dizziness, weakness, numbness and headaches.     Physical Exam Updated Vital Signs BP (!) 121/56 (BP Location: Right Arm)   Pulse 68   Temp 98 F (36.7 C) (Oral)   Resp 17   Ht 5\' 2"  (1.575 m)   Wt 59 kg   LMP 08/23/2018   SpO2 100%   BMI 23.78 kg/m   Physical Exam Vitals signs and nursing note reviewed.  Constitutional:      General: She is not in acute distress.    Appearance: Normal appearance.  HENT:     Right Ear: Tympanic membrane and ear canal normal.     Left Ear: Tympanic membrane and ear canal normal.     Mouth/Throat:     Mouth: Mucous membranes are moist.     Pharynx: No oropharyngeal exudate or posterior oropharyngeal erythema.     Comments: Uvula is midline and nonedematous.  No edema of the face, lips, or tongue Neck:     Musculoskeletal: Normal range of motion.  Cardiovascular:     Rate and Rhythm: Normal rate and regular rhythm.     Pulses: Normal pulses.  Pulmonary:     Effort: Pulmonary effort is normal.     Breath sounds: Normal breath sounds. No stridor. No wheezing.  Abdominal:     General: There is no distension.     Palpations: Abdomen is soft.     Tenderness: There is no abdominal tenderness.  Musculoskeletal:        General: No swelling, deformity or signs of injury.  Skin:    General: Skin is warm.     Findings: Rash present.     Comments: 2 cm erythematous, slightly raised lesion one to each of the  bilateral distal forearms.  No other rashes seen on skin exam.  No edema.  Neurological:     General: No focal deficit present.     Mental Status: She is alert.     Motor: No weakness.  Psychiatric:  Mood and Affect: Mood normal.      ED Treatments / Results  Labs (all labs ordered are listed, but only abnormal results are displayed) Labs Reviewed - No data to display  EKG None  Radiology No results found.  Procedures Procedures (including critical care time)  Medications Ordered in ED Medications  methylPREDNISolone sodium succinate (SOLU-MEDROL) 125 mg/2 mL injection 125 mg (125 mg Intravenous Given 09/14/18 2204)  famotidine (PEPCID) IVPB 20 mg premix (20 mg Intravenous New Bag/Given 09/14/18 2204)     Initial Impression / Assessment and Plan / ED Course  I have reviewed the triage vital signs and the nursing notes.  Pertinent labs & imaging results that were available during my care of the patient were reviewed by me and considered in my medical decision making (see chart for details).        Patient with possible allergic reaction after eating chicken this evening.  No history of anaphylactic reactions.  No edema of the face, lips, tongue or uvula.  She is speaking in full complete sentences without distress, she is also handling her secretions well.    2240 on recheck, patient has received IV Solu-Medrol and Pepcid.  She took 2 Benadryl prior to arrival.  She is now feeling much better and states her chest and throat tightness has improved and she is ready for discharge home.  She does have an appointment with her PCP tomorrow.  Discussed possible need for EpiPen, but she prefers to discuss this with her PCP.  Since she does have an appointment tomorrow I feel this is appropriate.  Strict return precautions were discussed  Final Clinical Impressions(s) / ED Diagnoses   Final diagnoses:  Allergic reaction, initial encounter    ED Discharge Orders    None         Kem Parkinson, PA-C 09/15/18 0034    Nat Christen, MD 09/17/18 [phone]

## 2018-09-14 NOTE — ED Triage Notes (Signed)
Pt c/o possible food allergy, airway patent and no swelling noted in triage, pt states she is unsure what the allergy could be to, had a similar episode x 1 week ago after eating chicken nuggets, pt reports eating grilled chicken tonight but denies same reaction other times she has eaten chicken, pt reports her throat feels raw and tight, pt has very minimal rash to both wrists, pt took 2 Benadryl prior to arrival

## 2018-09-14 NOTE — ED Notes (Signed)
Pt has appt with Dr Buzzy Han in the am

## 2018-09-15 ENCOUNTER — Ambulatory Visit (INDEPENDENT_AMBULATORY_CARE_PROVIDER_SITE_OTHER): Payer: Medicaid Other | Admitting: Family Medicine

## 2018-09-15 ENCOUNTER — Encounter: Payer: Self-pay | Admitting: Family Medicine

## 2018-09-15 ENCOUNTER — Other Ambulatory Visit: Payer: Self-pay | Admitting: Family Medicine

## 2018-09-15 VITALS — BP 113/67 | HR 76 | Temp 97.2°F | Ht 62.0 in | Wt 129.8 lb

## 2018-09-15 DIAGNOSIS — T7840XA Allergy, unspecified, initial encounter: Secondary | ICD-10-CM

## 2018-09-15 DIAGNOSIS — F5101 Primary insomnia: Secondary | ICD-10-CM | POA: Diagnosis not present

## 2018-09-15 DIAGNOSIS — F419 Anxiety disorder, unspecified: Secondary | ICD-10-CM

## 2018-09-15 MED ORDER — TRAZODONE HCL 50 MG PO TABS: 25.0000 mg | ORAL_TABLET | Freq: Every evening | ORAL | 3 refills | Status: DC | PRN

## 2018-09-15 MED ORDER — EPINEPHRINE 0.3 MG/0.3ML IJ SOAJ: 0.3000 mg | INTRAMUSCULAR | 1 refills | Status: DC | PRN

## 2018-09-15 NOTE — Progress Notes (Signed)
BP 113/67   Pulse 76   Temp (!) 97.2 F (36.2 C) (Oral)   Ht 5\' 2"  (1.575 m)   Wt 129 lb 12.8 oz (58.9 kg)   LMP 08/23/2018   BMI 23.74 kg/m    Subjective:    Patient ID: Jaime Flores, female    DOB: 08/09/1985, 33 y.o.   MRN: [id number]  HPI: Jaime Flores is a 33 y.o. female presenting on 09/15/2018 for trouble sleeping (Has been going on since Jan); Headache (x 2-3 weeks); and ER follow up (AP 2/23- food allergie)   HPI Insomnia Patient has had difficulty sleeping over the past few months.  She started to develop headaches over the past couple weeks.  She describes the headaches as both on the back of the right side of her head and sometimes frontal.  Patient has both had difficulty falling asleep and difficulty staying asleep as well over the past couple weeks and she feels like she is just not sleeping as well and is been very intermittent.  She says the headaches have also been intermittent over the past couple weeks and she feels like they could be related to her not sleeping as well.  She denies any sinus congestion or pressure or fevers or chills.  Her headaches are not every few days but some have lasted a day and some of lasted a couple of days.  Most of the time they were cleared with ibuprofen easily.  They do not keep her up at night.  We discussed extensively her bedtime routine and it seems like she has a good pattern except for that she is trying to fall asleep with the TV on.  Allergic reaction Patient had an allergic reaction yesterday went to the emergency department.  She started developing hives on her forearm which are almost completely gone and then she had a little bit of swelling and closing up of her throat that happened she thinks after eating at [employer].  She feels fine in that regards today.  She did receive some steroids at the emergency department.  Patient denies much of a rash today and denies any difficulty breathing today  Relevant past  medical, surgical, family and social history reviewed and updated as indicated. Interim medical history since our last visit reviewed. Allergies and medications reviewed and updated.  Review of Systems  Constitutional: Negative for chills and fever.  Eyes: Negative for visual disturbance.  Respiratory: Negative for chest tightness, shortness of breath, wheezing and stridor.   Cardiovascular: Negative for chest pain and leg swelling.  Musculoskeletal: Negative for back pain and gait problem.  Skin: Negative for color change and rash.  Neurological: Negative for light-headedness and headaches.  Psychiatric/Behavioral: Positive for sleep disturbance. Negative for agitation, behavioral problems, dysphoric mood, self-injury and suicidal ideas. The patient is not nervous/anxious.   All other systems reviewed and are negative.   Per HPI unless specifically indicated above   Allergies as of 09/15/2018      Reactions   Doxycycline Shortness Of Breath   Wellbutrin [bupropion] Shortness Of Breath, Nausea Only, Palpitations   Tape Swelling   Plastic tape please use paper      Medication List       Accurate as of September 15, 2018  8:36 AM. Always use your most recent med list.        citalopram 20 MG tablet Commonly known as:  CELEXA Take 1 tablet (20 mg total) by mouth daily.   cyclobenzaprine 10  MG tablet Commonly known as:  FLEXERIL Take 1 tablet (10 mg total) by mouth 3 (three) times daily as needed for muscle spasms.   EPINEPHrine 0.3 mg/0.3 mL Soaj injection Commonly known as:  EPI-PEN Inject 0.3 mLs (0.3 mg total) into the muscle as needed for anaphylaxis.   megestrol 40 MG tablet Commonly known as:  MEGACE Take 1 tablet (40 mg total) by mouth daily.   predniSONE 20 MG tablet Commonly known as:  DELTASONE Take 2 tablets (40 mg total) by mouth daily.   ranitidine 150 MG tablet Commonly known as:  ZANTAC Take 1 tablet (150 mg total) by mouth 2 (two) times daily.     traZODone 50 MG tablet Commonly known as:  DESYREL Take 0.5-1 tablets (25-50 mg total) by mouth at bedtime as needed for sleep.          Objective:    BP 113/67   Pulse 76   Temp (!) 97.2 F (36.2 C) (Oral)   Ht 5\' 2"  (1.575 m)   Wt 129 lb 12.8 oz (58.9 kg)   LMP 08/23/2018   BMI 23.74 kg/m   Wt Readings from Last 3 Encounters:  09/15/18 129 lb 12.8 oz (58.9 kg)  09/14/18 130 lb (59 kg)  08/14/18 128 lb 3.2 oz (58.2 kg)    Physical Exam Vitals signs and nursing note reviewed.  Constitutional:      General: She is not in acute distress.    Appearance: She is well-developed. She is not diaphoretic.  Eyes:     Conjunctiva/sclera: Conjunctivae normal.     Pupils: Pupils are equal, round, and reactive to light.  Cardiovascular:     Rate and Rhythm: Normal rate and regular rhythm.     Heart sounds: Normal heart sounds. No murmur.  Pulmonary:     Effort: Pulmonary effort is normal. No respiratory distress.     Breath sounds: Normal breath sounds. No wheezing.  Musculoskeletal: Normal range of motion.        General: No tenderness.  Skin:    General: Skin is warm and dry.     Findings: No rash.  Neurological:     Mental Status: She is alert and oriented to person, place, and time.     Coordination: Coordination normal.  Psychiatric:        Behavior: Behavior normal.       Assessment & Plan:   Problem List Items Addressed This Visit    None    Visit Diagnoses    Primary insomnia    -  Primary   Relevant Medications   traZODone (DESYREL) 50 MG tablet   Allergic reaction, initial encounter       Relevant Medications   EPINEPHrine 0.3 mg/0.3 mL IJ SOAJ injection       Follow up plan: Return in about 3 months (around 12/14/2018), or if symptoms worsen or fail to improve, for Recheck insomnia.  Counseling provided for all of the vaccine components No orders of the defined types were placed in this encounter.   Caryl Pina, MD [facility] 09/15/2018, 8:36 AM

## 2018-09-20 ENCOUNTER — Encounter: Payer: Self-pay | Admitting: Family Medicine

## 2018-09-20 DIAGNOSIS — T7840XD Allergy, unspecified, subsequent encounter: Secondary | ICD-10-CM

## 2018-10-03 ENCOUNTER — Other Ambulatory Visit: Payer: Self-pay

## 2018-10-03 ENCOUNTER — Emergency Department (HOSPITAL_COMMUNITY)
Admission: EM | Admit: 2018-10-03 | Discharge: 2018-10-03 | Disposition: A | Discharge status: Home / Self Care | Payer: Medicaid Other | Attending: Emergency Medicine | Admitting: Emergency Medicine

## 2018-10-03 ENCOUNTER — Encounter (HOSPITAL_COMMUNITY): Payer: Self-pay | Admitting: *Deleted

## 2018-10-03 DIAGNOSIS — R1310 Dysphagia, unspecified: Secondary | ICD-10-CM | POA: Diagnosis present

## 2018-10-03 DIAGNOSIS — L299 Pruritus, unspecified: Secondary | ICD-10-CM | POA: Diagnosis not present

## 2018-10-03 DIAGNOSIS — F17210 Nicotine dependence, cigarettes, uncomplicated: Secondary | ICD-10-CM | POA: Diagnosis not present

## 2018-10-03 DIAGNOSIS — T7840XA Allergy, unspecified, initial encounter: Secondary | ICD-10-CM

## 2018-10-03 DIAGNOSIS — R0689 Other abnormalities of breathing: Secondary | ICD-10-CM | POA: Diagnosis not present

## 2018-10-03 DIAGNOSIS — Z79899 Other long term (current) drug therapy: Secondary | ICD-10-CM | POA: Diagnosis not present

## 2018-10-03 DIAGNOSIS — R9431 Abnormal electrocardiogram [ECG] [EKG]: Secondary | ICD-10-CM | POA: Diagnosis not present

## 2018-10-03 MED ORDER — FAMOTIDINE 20 MG PO TABS: 20.0000 mg | ORAL_TABLET | Freq: Two times a day (BID) | ORAL | 0 refills | Status: DC

## 2018-10-03 MED ORDER — FAMOTIDINE IN NACL 20-0.9 MG/50ML-% IV SOLN
20.0000 mg | Freq: Once | INTRAVENOUS | Status: AC
  Administered 2018-10-03: 20 mg via INTRAVENOUS
  Filled 2018-10-03: qty 50

## 2018-10-03 NOTE — ED Notes (Signed)
Pt asleep, woke pt up, pt states throat feels better but still a little tender per pt..  Pt ambulated to BR without difficultly

## 2018-10-03 NOTE — Discharge Instructions (Addendum)
Take your prednisone twice a day for the next 2 days.  Take Benadryl for itching or swelling.  Follow-up this week with your doctor

## 2018-10-03 NOTE — ED Triage Notes (Signed)
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 Pt has recent hx of allergic reaction with unknown substance, since February. Pt has allergy panel on March 20 to figure out what she is allergic to. Pt took PO Prednisone and Benadryl that she had with her and helped her symptoms. EMS reports slight wheeze to right upper lung. Pt's O2 sat 98% on RA on arrival to ED.

## 2018-10-03 NOTE — ED Provider Notes (Signed)
[facility] EMERGENCY DEPARTMENT Provider Note   CSN: [id number] Arrival date & time: 10/03/18  1647    History   Chief Complaint Chief Complaint  Patient presents with  . Allergic Reaction    HPI Jaime Flores is a 33 y.o. female.     Patient states she was eating something and felt like her throat was closing off.  This is happened before she took some Benadryl and prednisone  The history is provided by the patient. No language interpreter was used.  Allergic Reaction  Presenting symptoms: itching   Presenting symptoms: no difficulty breathing and no rash   Severity:  Moderate Prior allergic episodes:  Food/nut allergies Context: not animal exposure   Relieved by:  Antihistamines Worsened by:  Nothing Ineffective treatments:  Steroids   Past Medical History:  Diagnosis Date  . Amenorrhea 01/27/2013  . Breast pain 03/10/2015  . Encounter for menstrual regulation 07/05/2015  . Irregular intermenstrual bleeding 07/28/2015  . Irregular periods 07/05/2015  . Screening for STD (sexually transmitted disease) 08/07/2013  . UTI (urinary tract infection)   . Vaginal discharge 06/10/2013    Patient Active Problem List   Diagnosis Date Noted  . Hemorrhoids 09/26/2016  . Pregnancy examination or test, negative result 06/29/2016  . Irregular intermenstrual bleeding 07/28/2015  . Amenorrhea 01/27/2013    Past Surgical History:  Procedure Laterality Date  . APPENDECTOMY    . CHOLECYSTECTOMY    . TUBAL LIGATION       OB History    Gravida  2   Para  2   Term  1   Preterm  1   AB      Living  2     SAB      TAB      Ectopic      Multiple      Live Births               Home Medications    Prior to Admission medications   Medication Sig Start Date End Date Taking? Authorizing Provider  citalopram (CELEXA) 20 MG tablet TAKE 1 TABLET DAILY 09/15/18  Yes [name], Fransisca Kaufmann, MD  cyclobenzaprine (FLEXERIL) 10 MG tablet Take 1 tablet (10 mg total)  by mouth 3 (three) times daily as needed for muscle spasms. 10/28/17  Yes [name], Fransisca Kaufmann, MD  megestrol (MEGACE) 40 MG tablet Take 1 tablet (40 mg total) by mouth daily. 01/02/18  Yes Derrek Monaco A, NP  predniSONE (DELTASONE) 20 MG tablet Take 2 tablets (40 mg total) by mouth daily. 09/14/18  Yes Triplett, Tammy, PA-C  ranitidine (ZANTAC) 150 MG tablet Take 1 tablet (150 mg total) by mouth 2 (two) times daily. 12/09/17  Yes [name], Fransisca Kaufmann, MD  traZODone (DESYREL) 50 MG tablet Take 0.5-1 tablets (25-50 mg total) by mouth at bedtime as needed for sleep. 09/15/18  Yes [name], Fransisca Kaufmann, MD  EPINEPHrine 0.3 mg/0.3 mL IJ SOAJ injection Inject 0.3 mLs (0.3 mg total) into the muscle as needed for anaphylaxis. 09/15/18   [name], Fransisca Kaufmann, MD  famotidine (PEPCID) 20 MG tablet Take 1 tablet (20 mg total) by mouth 2 (two) times daily. 10/03/18   Milton Luecke, MD    Family History Family History  Problem Relation Age of Onset  . Autism Son   . Diabetes Maternal Grandmother   . Stroke Maternal Grandmother   . Heart failure Maternal Grandmother   . Stroke Maternal Grandfather   . Stroke Father   . Seizures  Father   . Other Mother        herpes  . Heart failure Paternal Grandfather   . Diabetes Paternal Grandmother   . Hypertension Paternal Grandmother   . Autism Brother   . Other Paternal Uncle        heart exploded  . Cancer Maternal Aunt   . Diabetes Maternal Aunt   . Hypertension Maternal Aunt     Social History Social History   Tobacco Use  . Smoking status: Current Every Day Smoker    Packs/day: 0.50    Years: 8.00    Pack years: 4.00    Types: Cigarettes  . Smokeless tobacco: Never Used  Substance Use Topics  . Alcohol use: No  . Drug use: No     Allergies   Doxycycline; Wellbutrin [bupropion]; and Tape   Review of Systems Review of Systems  Constitutional: Negative for appetite change and fatigue.  HENT: Negative for congestion, ear discharge and sinus  pressure.        Difficulty swallowing  Eyes: Negative for discharge.  Respiratory: Negative for cough.   Cardiovascular: Negative for chest pain.  Gastrointestinal: Negative for abdominal pain and diarrhea.  Genitourinary: Negative for frequency and hematuria.  Musculoskeletal: Negative for back pain.  Skin: Positive for itching. Negative for rash.  Neurological: Negative for seizures and headaches.  Psychiatric/Behavioral: Negative for hallucinations.     Physical Exam Updated Vital Signs BP 112/63   Pulse 64   Temp 98.5 F (36.9 C) (Oral)   Resp 14   Ht 5\' 2"  (1.575 m)   Wt 58.9 kg   LMP 08/22/2018   SpO2 99%   BMI 23.74 kg/m   Physical Exam Vitals signs and nursing note reviewed.  Constitutional:      Appearance: She is well-developed.  HENT:     Head: Normocephalic.     Nose: Nose normal.  Eyes:     General: No scleral icterus.    Conjunctiva/sclera: Conjunctivae normal.  Neck:     Musculoskeletal: Neck supple.     Thyroid: No thyromegaly.  Cardiovascular:     Rate and Rhythm: Normal rate and regular rhythm.     Heart sounds: No murmur. No friction rub. No gallop.   Pulmonary:     Breath sounds: No stridor. No wheezing or rales.  Chest:     Chest wall: No tenderness.  Abdominal:     General: There is no distension.     Tenderness: There is no abdominal tenderness. There is no rebound.  Musculoskeletal: Normal range of motion.  Lymphadenopathy:     Cervical: No cervical adenopathy.  Skin:    Findings: No erythema or rash.  Neurological:     Mental Status: She is oriented to person, place, and time.     Motor: No abnormal muscle tone.     Coordination: Coordination normal.  Psychiatric:        Behavior: Behavior normal.      ED Treatments / Results  Labs (all labs ordered are listed, but only abnormal results are displayed) Labs Reviewed - No data to display  EKG None  Radiology No results found.  Procedures Procedures (including  critical care time)  Medications Ordered in ED Medications  famotidine (PEPCID) IVPB 20 mg premix (0 mg Intravenous Stopped 10/03/18 1814)     Initial Impression / Assessment and Plan / ED Course  I have reviewed the triage vital signs and the nursing notes.  Pertinent labs & imaging results that were  available during my care of the patient were reviewed by me and considered in my medical decision making (see chart for details).        Mild to moderate allergic reaction.  She responded to Benadryl and prednisone.  She was also given Pepcid.  Patient will be discharged home with prednisone and Pepcid and take Benadryl as needed and follow-up with her doctor  Final Clinical Impressions(s) / ED Diagnoses   Final diagnoses:  Allergic reaction, initial encounter    ED Discharge Orders         Ordered    famotidine (PEPCID) 20 MG tablet  2 times daily     10/03/18 2011           Milton Shark, MD 10/03/18 2015

## 2018-10-08 ENCOUNTER — Other Ambulatory Visit: Payer: Self-pay | Admitting: Adult Health

## 2018-10-10 ENCOUNTER — Ambulatory Visit: Payer: Medicaid Other | Admitting: Allergy & Immunology

## 2018-10-16 ENCOUNTER — Telehealth: Payer: Self-pay | Admitting: *Deleted

## 2018-10-16 NOTE — Telephone Encounter (Signed)
Spoke with pt letting her know no visitors or children at tomorrow's appt. Also, asked if pt has come in contact with someone that has been confirmed or suspected of having Covid-19 or if pt has experienced any symptoms herself. Pt states she has a cough. She is a smoker and it happens when she goes out in the cold and she has diarrhea that she thinks is from something she ate. I advised she will need to reschedule her appt when her symptoms subside. Pt stated she would like labs checked when she has her physical. I advised to schedule a morning appt and coming in fasting. Pt voiced understanding. [place]

## 2018-10-17 ENCOUNTER — Other Ambulatory Visit: Payer: Medicaid Other | Admitting: Adult Health

## 2018-10-17 ENCOUNTER — Encounter: Payer: Self-pay | Admitting: Family Medicine

## 2018-10-17 DIAGNOSIS — F419 Anxiety disorder, unspecified: Secondary | ICD-10-CM

## 2018-10-17 MED ORDER — CITALOPRAM HYDROBROMIDE 20 MG PO TABS: 20.0000 mg | ORAL_TABLET | Freq: Every day | ORAL | 1 refills | Status: DC

## 2018-10-22 ENCOUNTER — Ambulatory Visit: Payer: Medicaid Other | Admitting: Allergy & Immunology

## 2018-10-22 ENCOUNTER — Other Ambulatory Visit: Payer: Self-pay

## 2018-10-22 ENCOUNTER — Encounter: Payer: Self-pay | Admitting: Allergy & Immunology

## 2018-10-22 VITALS — BP 108/62 | HR 60 | Temp 98.4°F | Resp 14 | Ht 62.5 in | Wt 134.0 lb

## 2018-10-22 DIAGNOSIS — T7800XD Anaphylactic reaction due to unspecified food, subsequent encounter: Secondary | ICD-10-CM

## 2018-10-22 DIAGNOSIS — J302 Other seasonal allergic rhinitis: Secondary | ICD-10-CM | POA: Diagnosis not present

## 2018-10-22 MED ORDER — CETIRIZINE HCL 10 MG PO TABS: 10.0000 mg | ORAL_TABLET | Freq: Every day | ORAL | 5 refills | Status: DC

## 2018-10-22 NOTE — Progress Notes (Signed)
NEW PATIENT  Date of Service/Encounter:  10/22/18  Referring provider: [name], Fransisca Kaufmann, MD   Assessment:   Anaphylactic shock due to food  Seasonal allergic rhinitis   Jaime Flores presents for an evaluation of recurrent episodes of anaphylaxis.  Is not entirely clear what the trigger is and unfortunately the testing today was not enlightening.  It probably ruled out her entire panel of food allergies, as she did have an excellent histamine reaction.  Therefore, I think we can safely introduce these foods at home.  We are going to do some lab work to rule out serious causes of reactions including mastocytosis as well as an alpha gal allergy.  She does eat quite a bit of processed food, so we will rule out a gum allergy as well.  Gums are frequently added to processed foods to help with mouth feel and maintain textures, so we will see if this is contributing to her symptoms.  We are going to confirm that she knows when how to use her epinephrine with an anaphylaxis management plan.  We are also going to get an environmental allergy panel via the blood to see if she has any environmental triggers.  However, I do not think that her reactions are related to an environmental trigger.  Plan/Recommendations:   1. Anaphylactic shock due to food - Testing was negative to Peanut, Soy, Wheat, Sesame, Milk, Egg, Casein, Shellfish Mix , Fish Mix, Cashew, [place], Walnut, [place], [place], [place] nut, [place], [place], [place], [place], [place], [place], [place], [place], [place], [place], [place], [place], [place], [place], [place], Oat, Rye, Hops, [employer], Cottonseed, [employer], [place], [place], Chicken, [place], [place], Tomato, White Potato, Sweet Potato, Green Pea, [employer], Mushroom, Avocado, Onion, Cabbage, Carrots, Celery, Corn, Cucumber, Grape, Orange, Banana, Apple, Peach, Strawberry, Cantaloupe, Watermelon , Pineapple, Chocolate, Karaya Gum, Acacia (Arabic Gum), Cinnamon , Nutmeg, Ginger, Garlic, Black  Pepper and Mustard  - We are going to get some labs to look for other weird causes of anaphylaxis: serum tryptase, alpha gal panel, IgE to gums (food additives that can be a common allergen).  - We will call you in 1-2 weeks with the results of the testing.  - In the meantime, start a daily antihistamine to see if this can calm down the frequency of the reactions: Zyrtec (cetirizine) 10mg  once daily - Training for epinephrine auto-injectors provided: EpiPen  - Anaphylaxis management plan provided.  - There is a the low positive predictive value of food allergy testing and hence the high possibility of false positives. - In contrast, food allergy testing has a high negative predictive value, therefore if testing is negative we can be relatively assured that they are indeed negative.  - It is difficult to know how foods allergies will progress.  - In general, peanut, tree nut, and seafood allergies are life-long after age 23 or so.  2. Seasonal allergic rhinitis - We will get an environmental allergy test via the blood to see if anything relevant pops up. - The daily antihistamine will help with the symptoms as well  3. Return in about 3 months (around 01/21/2019). This can be an in-person, a virtual Webex or a telephone follow up visit.   Subjective:   Jaime Flores is a 33 y.o. female presenting today for evaluation of  Chief Complaint  Patient presents with   Urticaria    breaks after eating certain foods from certain places.    Angioedema    develops throat swelling and wheezing with the hives after  certain foods.     Jaime Flores has a history of the following: Patient Active Problem List   Diagnosis Date Noted   Hemorrhoids 09/26/2016   Pregnancy examination or test, negative result 06/29/2016   Irregular intermenstrual bleeding 07/28/2015   Amenorrhea 01/27/2013    History obtained from: chart review and patient.  Jaime Flores was referred by [name],  Fransisca Kaufmann, MD.     Jaime Flores is a 33 y.o. female presenting for an evaluation of Allergic reaction.  Since January, she has had a multitude of different reactions.  These occur after eating food.  The first episode occurred in February.  She went to [facility] and ate a chicken wrap.  Within minutes, she developed tightness of her throat and throat clearing.  She called her PCPs office and during her conversation she developed a raspy voice and coughing.  She was told to go to the emergency room right away.  She went to the ER and received methylprednisolone and famotidine and was discharged with complete resolution of her symptoms.  Per the ER note, she had erythematous slightly raised lesions on her bilateral distal forearms.  She does not have any pictures of these at all.  She decided to try the same meal again and actually tolerated it fine.  It has since occurred with eating Oreo cookies (although she has eaten Oreos since then without a problem) as well as Mento's gum.  The reaction with the gum was the worst episode and an ambulance actually had to be called.  In the ER, she had an EKG and famotidine and was discharged.  Another reaction occurred when she ate chicken nuggets at [employer].  She has since avoided all chicken nuggets.  She did not need to go to the hospital for this episode.  Additionally, she had a reaction after eating at a [employer].  She had chicken at [employer] as well.  But she has tolerated chicken and other foods without any problems.  She has never had any kind of problems with foods prior to February of this year.  She does not like peanuts.  She does eat pecans occasionally.  She does eat yogurt and cheese and drinks cows milk.  She does eat shrimp but does not eat a lot of other seafood.  She does have a history of seasonal allergies which are worse during the pollen season.  She takes nothing for the symptoms, however.  She has been getting more sinus  infections over the last year and estimates that she has had 4 rounds of antibiotics in the last 12 months.  She does not use any kind of nose spray.  She has no history of asthma.  She has never needed an inhaler.  She is a smoker for the past 11 years and has thought intermittently about quitting.  Otherwise, there is no history of other atopic diseases, including drug allergies, stinging insect allergies, eczema or contact dermatitis. There is no significant infectious history. Vaccinations are up to date.    Past Medical History: Patient Active Problem List   Diagnosis Date Noted   Hemorrhoids 09/26/2016   Pregnancy examination or test, negative result 06/29/2016   Irregular intermenstrual bleeding 07/28/2015   Amenorrhea 01/27/2013    Medication List:  Allergies as of 10/22/2018      Reactions   Doxycycline Shortness Of Breath   Wellbutrin [bupropion] Shortness Of Breath, Nausea Only, Palpitations   Tape Swelling   Plastic tape please use paper  Medication List       Accurate as of October 22, 2018 10:18 AM. Always use your most recent med list.        cetirizine 10 MG tablet Commonly known as:  ZYRTEC Take 1 tablet (10 mg total) by mouth daily for 30 days.   citalopram 20 MG tablet Commonly known as:  CELEXA Take 1 tablet (20 mg total) by mouth daily.   cyclobenzaprine 10 MG tablet Commonly known as:  FLEXERIL Take 1 tablet (10 mg total) by mouth 3 (three) times daily as needed for muscle spasms.   EPINEPHrine 0.3 mg/0.3 mL Soaj injection Commonly known as:  EPI-PEN Inject 0.3 mLs (0.3 mg total) into the muscle as needed for anaphylaxis.   famotidine 20 MG tablet Commonly known as:  Pepcid Take 1 tablet (20 mg total) by mouth 2 (two) times daily.   megestrol 40 MG tablet Commonly known as:  MEGACE Take 1 tablet (40 mg total) by mouth daily.   predniSONE 20 MG tablet Commonly known as:  DELTASONE Take 2 tablets (40 mg total) by mouth daily.     ranitidine 150 MG tablet Commonly known as:  Zantac Take 1 tablet (150 mg total) by mouth 2 (two) times daily.   traZODone 50 MG tablet Commonly known as:  DESYREL Take 0.5-1 tablets (25-50 mg total) by mouth at bedtime as needed for sleep.       Birth History: non-contributory  Developmental History: non-contributory  Past Surgical History: Past Surgical History:  Procedure Laterality Date   APPENDECTOMY     CHOLECYSTECTOMY     TUBAL LIGATION       Family History: Family History  Problem Relation Age of Onset   Autism Son    Diabetes Maternal Grandmother    Stroke Maternal Grandmother    Heart failure Maternal Grandmother    Stroke Maternal Grandfather    Stroke Father    Seizures Father    Allergies Father        penicillin   Other Mother        herpes   Heart failure Paternal Grandfather    Diabetes Paternal Grandmother    Hypertension Paternal Grandmother    Autism Brother    Other Paternal Uncle        heart exploded   Cancer Maternal Aunt    Diabetes Maternal Aunt    Hypertension Maternal Aunt    Immunodeficiency Neg Hx    Urticaria Neg Hx    Eczema Neg Hx    Atopy Neg Hx    Asthma Neg Hx    Angioedema Neg Hx    Allergic rhinitis Neg Hx      Social History: Jaime Flores lives at home with her family including two children.  The oldest, a son who is age 87, has autism.  They live in a house that is 31 years old.  There is carpeting throughout the home.  She has gas heating and central cooling.  There are dogs and cats inside of the home as well as dogs, chickens, horses, goat, and pigs outside of the home.  There are no dust mite covers on the bedding.  She does smoke in her car, but not the house.  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   Review of Systems  Constitutional: Negative.  Negative for fever, malaise/fatigue and weight loss.  HENT: Negative.  Negative for congestion, ear discharge and ear pain.    Eyes: Negative for pain, discharge and  redness.  Respiratory: Negative for cough, sputum production, shortness of breath and wheezing.   Cardiovascular: Negative.  Negative for chest pain and palpitations.  Gastrointestinal: Negative for abdominal pain, diarrhea, heartburn, nausea and vomiting.  Musculoskeletal: Positive for myalgias.  Skin: Negative.  Negative for itching and rash.  Neurological: Negative for dizziness and headaches.  Endo/Heme/Allergies: Positive for environmental allergies. Does not bruise/bleed easily.       Objective:   Blood pressure 108/62, pulse 60, temperature 98.4 F (36.9 C), temperature source Oral, resp. rate 14, height 5' 2.5" (1.588 m), weight 134 lb (60.8 kg), SpO2 97 %. Body mass index is 24.12 kg/m.   Physical Exam:   Physical Exam  Constitutional: She appears well-developed.  Pleasant female.  HENT:  Head: Normocephalic and atraumatic.  Right Ear: Tympanic membrane, external ear and ear canal normal. No drainage, swelling or tenderness. Tympanic membrane is not injected, not scarred, not erythematous, not retracted and not bulging.  Left Ear: Tympanic membrane, external ear and ear canal normal. No drainage, swelling or tenderness. Tympanic membrane is not injected, not scarred, not erythematous, not retracted and not bulging.  Nose: Mucosal edema and rhinorrhea present. No nasal deformity or septal deviation. No epistaxis. Right sinus exhibits no maxillary sinus tenderness and no frontal sinus tenderness. Left sinus exhibits no maxillary sinus tenderness and no frontal sinus tenderness.  Mouth/Throat: Uvula is midline and oropharynx is clear and moist. Mucous membranes are not pale and not dry.  No cobblestoning in the posterior oropharynx.  Eyes: Pupils are equal, round, and reactive to light. Conjunctivae and EOM are normal. Right eye exhibits no chemosis and no discharge. Left eye exhibits no chemosis and no discharge. Right conjunctiva is  not injected. Left conjunctiva is not injected.  Cardiovascular: Normal rate, regular rhythm, S1 normal, S2 normal and normal heart sounds.  No murmur heard. Respiratory: Effort normal and breath sounds normal. No accessory muscle usage. No tachypnea. No respiratory distress. She has no wheezes. She has no rhonchi. She has no rales. She exhibits no tenderness.  Moving air well in all lung fields.  No crackles or wheezes appreciated.  GI: There is no abdominal tenderness. There is no rebound and no guarding.  Lymphadenopathy:       Head (right side): No submandibular, no tonsillar and no occipital adenopathy present.       Head (left side): No submandibular, no tonsillar and no occipital adenopathy present.    She has no cervical adenopathy.  Neurological: She is alert.  Skin: No abrasion, no petechiae and no rash noted. Rash is not papular, not vesicular and not urticarial. No erythema. No pallor.  No urticarial or eczematous lesions.  Psychiatric: She has a normal mood and affect.     Diagnostic studies:    Allergy Studies:    Food Adult Perc - 10/22/18 0900    Time Antigen Placed  4098    Allergen Manufacturer  Lavella Hammock    Location  Back    Number of allergen test  72     Control-buffer 50% Glycerol  Negative    Control-Histamine 1 mg/ml  2+    1. Peanut  Negative    2. Soybean  Negative    3. Wheat  Negative    4. Sesame  Negative    5. Milk, cow  Negative    6. Egg White, Chicken  Negative    7. Casein  Negative    8. Shellfish Mix  Negative    9. Fish Mix  Negative    10. Cashew  Negative    11. Pecan Food  Negative    12. [place]  Negative    13. Almond  Negative    14. Hazelnut  Negative    15. [place] nut  Negative    16. Coconut  Negative    17. Pistachio  Negative    18. Catfish  Negative    19. Bass  Negative    20. Trout  Negative    21. Tuna  Negative    22. Salmon  Negative    23. Flounder  Negative    24. Codfish  Negative    25. Shrimp  Negative     26. Crab  Negative    27. Lobster  Negative    28. Oyster  Negative    29. Scallops  Negative    30. Barley  Negative    31. Oat   Negative    32. Rye   Negative    33. Hops  Negative    34. Rice  Negative    35. Cottonseed  Negative    36. Saccharomyces Cerevisiae   Negative    37. Pork  Negative    38. [place] Meat  Negative    39. Chicken Meat  Negative    40. Beef  Negative    41. Lamb  Negative    42. Tomato  Negative    43. White Potato  Negative    44. Sweet Potato  Negative    45. Pea, Green/English  Negative    46. Navy Bean  Negative    47. Mushrooms  Negative    48. Avocado  Negative    49. Onion  Negative    50. Cabbage  Negative    51. Carrots  Negative    52. Celery  Negative    53. Corn  Negative    54. Cucumber  Negative    55. Grape (White seedless)  Negative    56. Orange   Negative    57. Banana  Negative    58. Apple  Negative    59. Peach  Negative    60. Strawberry  Negative    61. Cantaloupe  Negative    62. Watermelon  Negative    63. Pineapple  Negative    64. Chocolate/Cacao bean  Negative    65. Karaya Gum  Negative    66. Acacia (Arabic Gum)  Negative    67. Cinnamon  Negative    68. Nutmeg  Negative    69. Ginger  Negative    70. Garlic  Negative    71. Pepper, black  Negative    72. Mustard  Negative       Allergy testing results were read and interpreted by myself, documented by clinical staff.         Salvatore Marvel, MD [facility]

## 2018-10-22 NOTE — Patient Instructions (Addendum)
1. Anaphylactic shock due to food - Testing was negative to Peanut, Soy, Wheat, Sesame, Milk, Egg, Casein, Shellfish Mix , Fish Mix, Cashew, [place], Walnut, [place], [place], [place] nut, [place], [place], [place], [place], [place], [place], [place], [place], [place], [place], [place], [place], [place], [place], [place], Oat, Rye, Hops, [employer], Cottonseed, [employer], [place], [place], Chicken, [place], [place], Tomato, White Potato, Sweet Potato, Green Pea, [employer], Mushroom, Avocado, Onion, Cabbage, Carrots, Celery, Corn, Cucumber, Grape, Orange, Banana, Apple, Peach, Strawberry, Cantaloupe, Watermelon , Pineapple, Chocolate, Karaya Gum, Acacia (Arabic Gum), Cinnamon , Nutmeg, Ginger, Garlic, Black Pepper and Mustard  - We are going to get some labs to look for other weird causes of anaphylaxis: serum tryptase, alpha gal panel, IgE to gums (food additives that can be a common allergen).  - We will call you in 1-2 weeks with the results of the testing.  - In the meantime, start a daily antihistamine to see if this can calm down the frequency of the reactions: Zyrtec (cetirizine) 10mg  once daily - Training for epinephrine auto-injectors provided: EpiPen  - Anaphylaxis management plan provided.  - There is a the low positive predictive value of food allergy testing and hence the high possibility of false positives. - In contrast, food allergy testing has a high negative predictive value, therefore if testing is negative we can be relatively assured that they are indeed negative.  - It is difficult to know how foods allergies will progress.  - In general, peanut, tree nut, and seafood allergies are life-long after age 19 or so.  2. Seasonal allergic rhinitis - We will get an environmental allergy test via the blood to see if anything relevant pops up. - The daily antihistamine will help with the symptoms as well  3. Return in about 3 months (around 01/21/2019). This can be an in-person, a virtual Webex or a  telephone follow up visit.   Please inform us of any Emergency Department visits, hospitalizations, or changes in symptoms. Call us before going to the ED for breathing or allergy symptoms since we might be able to fit you in for a sick visit. Feel free to contact us anytime with any questions, problems, or concerns.  It was a pleasure to meet you today!  Websites that have reliable patient information: 1. American Academy of Asthma, Allergy, and Immunology: www.aaaai.org 2. Food Allergy Research and Education (FARE): foodallergy.org 3. Mothers of Asthmatics: http://www.asthmacommunitynetwork.org 4. American College of Allergy, Asthma, and Immunology: www.acaai.org  "Like" [place] on Facebook and Instagram for our latest updates!      Make sure you are registered to vote! If you have moved or changed any of your contact information, you will need to get this updated before voting!    Voter ID laws are NOT going into effect for the General Election in November 2020! DO NOT let this stop you from exercising your right to vote!

## 2018-10-28 ENCOUNTER — Encounter: Payer: Self-pay | Admitting: Allergy & Immunology

## 2018-10-28 LAB — IGE+ALLERGENS ZONE 2(30)

## 2018-10-28 LAB — ALPHA-GAL PANEL
Alpha Gal IgE*: <0.1 kU/L (ref <0.10)
Beef (Bos spp) IgE: <0.1 kU/L (ref <0.35)
Class Interpretation: 0
Class Interpretation: 0
Class Interpretation: 0
Lamb/Mutton (Ovis spp) IgE: <0.1 kU/L (ref <0.35)
Pork (Sus spp) IgE: <0.1 kU/L (ref <0.35)

## 2018-10-28 LAB — F297-IGE ACACIA GUM: F297-IgE Acacia Gum: <0.1 kU/L

## 2018-10-28 LAB — XANTHAN GUM IGE
Class Interpretation: 0
Xanthan Gum, IgE*: <0.35 kU/L (ref <0.35)

## 2018-10-28 LAB — TRYPTASE: Tryptase: 3.5 ug/L (ref 2.2–13.2)

## 2018-10-28 LAB — ALLERGEN GUM CARAGEENAN
Carageenan Gum, IgE: <0.35 kU/L (ref <0.35)
Class Interpretation: 0

## 2018-11-06 ENCOUNTER — Encounter: Payer: Self-pay | Admitting: Family Medicine

## 2018-11-07 ENCOUNTER — Encounter: Payer: Self-pay | Admitting: Allergy & Immunology

## 2018-11-11 ENCOUNTER — Other Ambulatory Visit: Payer: Self-pay | Admitting: Family Medicine

## 2018-11-11 ENCOUNTER — Telehealth: Payer: Self-pay | Admitting: Family Medicine

## 2018-11-11 DIAGNOSIS — F419 Anxiety disorder, unspecified: Secondary | ICD-10-CM

## 2018-11-11 NOTE — Telephone Encounter (Signed)
What is the name of the medication? citalopram (CELEXA) 20 MG tablet  Have you contacted your pharmacy to request a refill? Yes but pharmacy told to call us, she still has a refill on file  Which pharmacy would you like this sent to? [facility]   Patient notified that their request is being sent to the clinical staff for review and that they should receive a call once it is complete. If they do not receive a call within 24 hours they can check with their pharmacy or our office.

## 2018-11-14 ENCOUNTER — Ambulatory Visit (INDEPENDENT_AMBULATORY_CARE_PROVIDER_SITE_OTHER): Payer: Medicaid Other | Admitting: Allergy & Immunology

## 2018-11-14 ENCOUNTER — Other Ambulatory Visit: Payer: Self-pay

## 2018-11-14 ENCOUNTER — Encounter: Payer: Self-pay | Admitting: Allergy & Immunology

## 2018-11-14 DIAGNOSIS — T7800XD Anaphylactic reaction due to unspecified food, subsequent encounter: Secondary | ICD-10-CM

## 2018-11-14 MED ORDER — CETIRIZINE HCL 10 MG PO TABS: 10.0000 mg | ORAL_TABLET | Freq: Every day | ORAL | 5 refills | Status: DC

## 2018-11-14 NOTE — Progress Notes (Signed)
RE: Jaime Flores MRN: [id number] DOB: [birth year] Date of Telemedicine Visit: 11/14/2018  Referring provider: [name], Jaime Kaufmann, MD Primary care provider: [name], Jaime Kaufmann, MD  Chief Complaint: Food Intolerance (possible reaction to [employer] )   Telemedicine Follow Up Visit via Telephone: I connected with Jaime Flores for a follow up on 11/14/18 by telephone and verified that I am speaking with the correct person using two identifiers.   I discussed the limitations, risks, security and privacy concerns of performing an evaluation and management service by telephone and the availability of in person appointments. I also discussed with the patient that there may be a patient responsible charge related to this service. The patient expressed understanding and agreed to proceed.  Patient is at home accompanied by herself who provided/contributed to the history.  Provider is at the office.  Visit start time: 2:53 PM Visit end time: 3:10 PM Insurance consent/check in by: [place] consent and medical assistant/nurse: Lovena Le  History of Present Illness:  She is a 33 y.o. female, who is being followed for idiopathic anaphylaxis. Her previous allergy office visit was in one month ago with Dr. Ernst Bowler.  At that time, she underwent an extensive work-up for anaphylaxis of unknown origin.  She had the entire panel tested which was completely negative.  We obtain some labs to rule out other causes of anaphylaxis including a serum tryptase, alpha gal panel, and IgE to gums.  All of this work-up was negative.  She had gotten some chicken nuggets from her son. She ate one last one from her son's lunch. She started feeling coughing and throat closure. She "popped two Benadryl". She did talk to the manager to see what type of oil they use, and she was told that it was peanut oil. It took around 45 minutes for everything to clear up after the Benadryl. The tightness remains for a while  through the night and it does affect her sleep.  After this incident, she was in the bed and she reports that she was having an allergic reaction although she had not eaten anything. She noted that she was wheezing a lot when she was in bed. She started hyperventilating for no reason at all. "This was 4-5 hours after eating something. She did not feel tightness in her throat at all. She did take Benadryl then. She was having symptoms for every 45 minutes to 60 minutes.   She tells me today that she is afraid that I am going to think that she is "crazy". Otherwise, there have been no changes to her past medical history, surgical history, family history, or social history.  Assessment and Plan:  [name] is a 33 y.o. female with:  Idiopathic anaphylaxis - still unknown trigger   It is still unclear what is causing Jaime Flores's reactions.  I cannot come up with a common trigger between the pizza and the chicken nuggets.  I still think that it might be related to some kind of additive that we just have not been able to put her finger on.  Another thing to consider is a lupin allergy, which is a common food additive, especially in processed foods.  This is tested via a blood test which is sent to [facility].  We can certainly consider sending this at the next visit.  I am hoping that the addition of the cetirizine daily will help modulate her allergic reactions and possibly prevent reactions from occurring at all. We are going to get a serum  tryptase within four hours of the reaction, which would point towards or away from an allergic etiology of her symptoms.    1. 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  - Maybe this will keep reactions from occurring.  - Give me an update to let me know what happens.  - I will continue to think about what might be  happening. - Ordered serum tryptase to be drawn after your next reaction.   2. Follow up as scheduled in July 2020.     Diagnostics: None.  Medication List:  Current Outpatient Medications  Medication Sig Dispense Refill   citalopram (CELEXA) 20 MG tablet TAKE 1 TABLET DAILY 30 tablet 0   cyclobenzaprine (FLEXERIL) 10 MG tablet Take 1 tablet (10 mg total) by mouth 3 (three) times daily as needed for muscle spasms. 30 tablet 0   EPINEPHrine 0.3 mg/0.3 mL IJ SOAJ injection Inject 0.3 mLs (0.3 mg total) into the muscle as needed for anaphylaxis. 1 Device 1   famotidine (PEPCID) 20 MG tablet Take 1 tablet (20 mg total) by mouth 2 (two) times daily. (Patient taking differently: Take 20 mg by mouth 2 (two) times daily. Only with reactions.) 10 tablet 0   megestrol (MEGACE) 40 MG tablet Take 1 tablet (40 mg total) by mouth daily. (Patient taking differently: Take 40 mg by mouth daily as needed. ) 30 tablet 1   ranitidine (ZANTAC) 150 MG tablet Take 1 tablet (150 mg total) by mouth 2 (two) times daily. 60 tablet 11   traZODone (DESYREL) 50 MG tablet Take 0.5-1 tablets (25-50 mg total) by mouth at bedtime as needed for sleep. 30 tablet 3   cetirizine (ZYRTEC) 10 MG tablet Take 1 tablet (10 mg total) by mouth daily for 30 days. 30 tablet 5   No current facility-administered medications for this visit.    Allergies: Allergies  Allergen Reactions   Doxycycline Shortness Of Breath   Wellbutrin [Bupropion] Shortness Of Breath, Nausea Only and Palpitations   Tape Swelling    Plastic tape please use paper   I reviewed her past medical history, social history, family history, and environmental history and no significant changes have been reported from previous visits.  Review of Systems  Constitutional: Negative for activity change and appetite change.  HENT: Negative for congestion, postnasal drip, rhinorrhea, sinus pressure and sore throat.   Eyes: Negative for pain, discharge,  redness and itching.  Respiratory: Negative for shortness of breath, wheezing and stridor.   Gastrointestinal: Negative for diarrhea, nausea and vomiting.  Musculoskeletal: Negative for arthralgias, joint swelling and myalgias.  Skin: Negative for rash.  Allergic/Immunologic: Negative for environmental allergies and food allergies.    Objective:  Physical exam not obtained as encounter was done via telephone.   Previous notes and tests were reviewed.  I discussed the assessment and treatment plan with the patient. The patient was provided an opportunity to ask questions and all were answered. The patient agreed with the plan and demonstrated an understanding of the instructions.   The patient was advised to call back or seek an in-person evaluation if the symptoms worsen or if the condition fails to improve as anticipated.  I provided 17 minutes of non-face-to-face time during this encounter.  It was my pleasure to participate in [place]'s care today. Please feel free to contact me with  any questions or concerns.   Sincerely,  Valentina Shaggy, MD

## 2018-11-14 NOTE — Progress Notes (Signed)
Start time 108

## 2018-11-14 NOTE — Patient Instructions (Addendum)
1. 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  - Maybe this will keep reactions from occurring.  - Give me an update to let me know what happens.  - I will continue to think about what might be happening. - Ordered serum tryptase to be drawn after your next reaction.   3. No follow-ups on file. This can be an in-person, a virtual Webex or a telephone follow up visit.   Please inform us of any Emergency Department visits, hospitalizations, or changes in symptoms. Call us before going to the ED for breathing or allergy symptoms since we might be able to fit you in for a sick visit. Feel free to contact us anytime with any questions, problems, or concerns.  It was a pleasure to talk to you today today!  Websites that have reliable patient information: 1. American Academy of Asthma, Allergy, and Immunology: www.aaaai.org 2. Food Allergy Research and Education (FARE): foodallergy.org 3. Mothers of Asthmatics: http://www.asthmacommunitynetwork.org 4. American College of Allergy, Asthma, and Immunology: www.acaai.org  "Like" [place] on Facebook and Instagram for our latest updates!      Make sure you are registered to vote! If you have moved or changed any of your contact information, you will need to get this updated before voting!    Voter ID laws are NOT going into effect for the General Election in November 2020! DO NOT let this stop you from exercising your right to vote!

## 2018-11-26 ENCOUNTER — Other Ambulatory Visit: Payer: Self-pay

## 2018-11-26 ENCOUNTER — Encounter: Payer: Self-pay | Admitting: Family

## 2018-11-26 ENCOUNTER — Ambulatory Visit (INDEPENDENT_AMBULATORY_CARE_PROVIDER_SITE_OTHER): Payer: Medicaid Other | Admitting: Family

## 2018-11-26 ENCOUNTER — Encounter: Payer: Self-pay | Admitting: Family Medicine

## 2018-11-26 DIAGNOSIS — H6691 Otitis media, unspecified, right ear: Secondary | ICD-10-CM

## 2018-11-26 MED ORDER — AMOXICILLIN 500 MG PO CAPS: 500.0000 mg | ORAL_CAPSULE | Freq: Two times a day (BID) | ORAL | 0 refills | Status: DC

## 2018-11-26 NOTE — Progress Notes (Signed)
   Virtual Visit via telephone Note I connected with Jaime Flores on 19/37/90 at 1:27 pm by telephone and verified that I am speaking with the correct person using two identifiers. Jaime Flores is currently located at home  and no one  is currently with her during visit. The provider, Evelina Dun, FNP is located in their office at time of visit.  I discussed the limitations, risks, security and privacy concerns of performing an evaluation and management service by telephone and the availability of in person appointments. I also discussed with the patient that there may be a patient responsible charge related to this service. The patient expressed understanding and agreed to proceed.   History and Present Illness:  Otalgia   There is pain in the right ear. This is a new problem. The current episode started 1 to 4 weeks ago. The problem occurs constantly. The problem has been unchanged. There has been no fever. The pain is at a severity of 2/10. The pain is mild. Associated symptoms include ear discharge, headaches, hearing loss and rhinorrhea. Pertinent negatives include no sore throat. She has tried nothing for the symptoms. The treatment provided no relief.      Review of Systems  HENT: Positive for ear discharge, ear pain, hearing loss and rhinorrhea. Negative for sore throat.   Neurological: Positive for headaches.  All other systems reviewed and are negative.    Observations/Objective: No SOB or distress noted   Assessment and Plan: 1. Right otitis media, unspecified otitis media type Keep clean and dry Do not stick anything into ear Tylenol as needed for pain Call office if symptoms worsen or do not improve  - amoxicillin (AMOXIL) 500 MG capsule; Take 1 capsule (500 mg total) by mouth 2 (two) times daily.  Dispense: 14 capsule; Refill: 0     I discussed the assessment and treatment plan with the patient. The patient was provided an opportunity to ask questions and  all were answered. The patient agreed with the plan and demonstrated an understanding of the instructions.   The patient was advised to call back or seek an in-person evaluation if the symptoms worsen or if the condition fails to improve as anticipated.  The above assessment and management plan was discussed with the patient. The patient verbalized understanding of and has agreed to the management plan. Patient is aware to call the clinic if symptoms persist or worsen. Patient is aware when to return to the clinic for a follow-up visit. Patient educated on when it is appropriate to go to the emergency department.   Time call ended:  1:39 pm  I provided 12 minutes of non-face-to-face time during this encounter.    Evelina Dun, FNP

## 2018-11-28 ENCOUNTER — Encounter: Payer: Self-pay | Admitting: Family Medicine

## 2018-12-17 ENCOUNTER — Encounter: Payer: Self-pay | Admitting: Family Medicine

## 2018-12-17 ENCOUNTER — Telehealth: Payer: Self-pay | Admitting: *Deleted

## 2018-12-17 ENCOUNTER — Ambulatory Visit (INDEPENDENT_AMBULATORY_CARE_PROVIDER_SITE_OTHER): Payer: Medicaid Other | Admitting: Family Medicine

## 2018-12-17 ENCOUNTER — Telehealth: Payer: Self-pay

## 2018-12-17 ENCOUNTER — Other Ambulatory Visit: Payer: Self-pay

## 2018-12-17 ENCOUNTER — Other Ambulatory Visit: Payer: Medicaid Other

## 2018-12-17 ENCOUNTER — Other Ambulatory Visit: Payer: Self-pay | Admitting: Family Medicine

## 2018-12-17 DIAGNOSIS — R6889 Other general symptoms and signs: Secondary | ICD-10-CM | POA: Diagnosis not present

## 2018-12-17 DIAGNOSIS — Z20822 Contact with and (suspected) exposure to covid-19: Secondary | ICD-10-CM

## 2018-12-17 NOTE — Telephone Encounter (Signed)
Attempted to contact pt to schedule testing per Dr Vonna Kotyk [name] ;left message on voicemail.

## 2018-12-17 NOTE — Telephone Encounter (Signed)
-----   Message from Janora Norlander, DO sent at 12/17/2018  1:39 PM EDT ----- Regarding: Possible COVID19 Please arrange testing for this patient.  She has a 1 day hx of sore throat, myalgia, fatigue and nausea.  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  Thanks, [employer]. Lajuana Ripple, [facility]

## 2018-12-17 NOTE — Telephone Encounter (Signed)
rec'd call from pt. Inquiring about scheduling COVID 19 test, as requested by her PCP.  Appt. Scheduled today at 3:30 PM at the [facility].  Advised to wear a mask and drive up to the testing site.  Advised to remain in her car, and follow instructions by the staff.  Verb. Understanding.

## 2018-12-17 NOTE — Progress Notes (Signed)
Telephone visit  Subjective: CC: Sore throat PCP: [name], Fransisca Kaufmann, MD [facility] Jaime Flores is a 33 y.o. female calls for telephone consult today. Patient provides verbal consent for consult held via phone.  Location of patient: Home Location of provider: [facility] Others present for call: Children  1.  Sore throat Patient reports a 1 day history of mild cough, sore throat, myalgia, fatigue and nausea.  She denies any vomiting, diarrhea or fevers.  She notes that she "feels like she has the flu".  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  No therapies used thus far.  She has been trying to rest and hydrate.  She is worried about potentially having COVID-19 given constellation of symptoms and contact with multiple persons.  She reports feeling guilty about possibly having affected others.   ROS: Per HPI  Allergies  Allergen Reactions  . Doxycycline Shortness Of Breath  . Wellbutrin [Bupropion] Shortness Of Breath, Nausea Only and Palpitations  . Tape Swelling    Plastic tape please use paper   Past Medical History:  Diagnosis Date  . Amenorrhea 01/27/2013  . Angio-edema   . Breast pain 03/10/2015  . Encounter for menstrual regulation 07/05/2015  . Irregular intermenstrual bleeding 07/28/2015  . Irregular periods 07/05/2015  . Screening for STD (sexually transmitted disease) 08/07/2013  . Urticaria   . UTI (urinary tract infection)   . Vaginal discharge 06/10/2013    Current Outpatient Medications:  .  amoxicillin (AMOXIL) 500 MG capsule, Take 1 capsule (500 mg total) by mouth 2 (two) times daily., Disp: 14 capsule, Rfl: 0 .  cetirizine (ZYRTEC) 10 MG tablet, Take 1 tablet (10 mg total) by mouth daily for 30 days., Disp: 30 tablet, Rfl: 5 .  citalopram (CELEXA) 20 MG tablet, TAKE 1 TABLET DAILY, Disp: 30 tablet, Rfl: 0 .  cyclobenzaprine (FLEXERIL) 10 MG tablet, Take 1 tablet (10 mg total) by mouth 3 (three) times daily as needed for  muscle spasms., Disp: 30 tablet, Rfl: 0 .  EPINEPHrine 0.3 mg/0.3 mL IJ SOAJ injection, Inject 0.3 mLs (0.3 mg total) into the muscle as needed for anaphylaxis., Disp: 1 Device, Rfl: 1 .  megestrol (MEGACE) 40 MG tablet, Take 1 tablet (40 mg total) by mouth daily. (Patient taking differently: Take 40 mg by mouth daily as needed. ), Disp: 30 tablet, Rfl: 1 .  traZODone (DESYREL) 50 MG tablet, Take 0.5-1 tablets (25-50 mg total) by mouth at bedtime as needed for sleep., Disp: 30 tablet, Rfl: 3  Assessment/ Plan: 33 y.o. female   1. Suspected Covid-19 Virus Infection Suspicious for COVID-19 infection.  I have sent a message to the COVID testing site to see if perhaps patient can be tested.  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  She is to quarantine at home.  We discussed that she cannot return to work or activities until she has been confirmed negative if tested or until she is asymptomatic for 3 consecutive days and has been quarantined for at least 7 days from onset of illness.   Start time: 1:36pm End time: 1:44pm  Total time spent on patient care (including telephone call/ virtual visit): 15 minutes  [place], [place] [phone]

## 2018-12-18 ENCOUNTER — Encounter: Payer: Self-pay | Admitting: Allergy & Immunology

## 2018-12-19 ENCOUNTER — Encounter: Payer: Self-pay | Admitting: Family Medicine

## 2018-12-19 ENCOUNTER — Telehealth: Payer: Self-pay | Admitting: Family Medicine

## 2018-12-19 LAB — NOVEL CORONAVIRUS, NAA: SARS-CoV-2, NAA: NOT DETECTED

## 2018-12-19 NOTE — Telephone Encounter (Signed)
DR  G - sent for testing - will forward to [facility] for back-up provider

## 2018-12-19 NOTE — Telephone Encounter (Signed)
Forwarding to you :)

## 2018-12-20 ENCOUNTER — Other Ambulatory Visit: Payer: Self-pay | Admitting: Physician Assistant

## 2018-12-23 NOTE — Telephone Encounter (Signed)
This was already taken care of - she doesn't need another note.

## 2018-12-23 NOTE — Telephone Encounter (Signed)
I am unsure as to what the question is.  Does she need a note showing the negative result?

## 2019-01-12 ENCOUNTER — Encounter: Payer: Self-pay | Admitting: Family Medicine

## 2019-01-12 ENCOUNTER — Other Ambulatory Visit: Payer: Self-pay

## 2019-01-12 ENCOUNTER — Ambulatory Visit (INDEPENDENT_AMBULATORY_CARE_PROVIDER_SITE_OTHER): Payer: Medicaid Other | Admitting: Family Medicine

## 2019-01-12 DIAGNOSIS — F419 Anxiety disorder, unspecified: Secondary | ICD-10-CM

## 2019-01-12 DIAGNOSIS — M546 Pain in thoracic spine: Secondary | ICD-10-CM

## 2019-01-12 MED ORDER — CITALOPRAM HYDROBROMIDE 40 MG PO TABS: 40.0000 mg | ORAL_TABLET | Freq: Every day | ORAL | 3 refills | Status: DC
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
## 2019-01-20 ENCOUNTER — Telehealth: Payer: Self-pay | Admitting: Family Medicine

## 2019-01-20 ENCOUNTER — Telehealth: Payer: Self-pay

## 2019-01-20 NOTE — Telephone Encounter (Signed)
Aware.  We do not know which office would accept medicaid and if it would pay.

## 2019-01-20 NOTE — Telephone Encounter (Signed)
Patient is calling to see what else she can take with her zyrtec when she has allergy flares other than benadryl.  Please Advise.

## 2019-01-20 NOTE — Telephone Encounter (Signed)
She can double up her Zyrtec to a max dose of 2 tablets twice daily in total. Otherwise she can use Allegra one tablet as needed for reactions.  Salvatore Marvel, MD [facility] [place] [facility] [place]

## 2019-01-20 NOTE — Telephone Encounter (Signed)
Dr. Gallagher please advise.  

## 2019-01-20 NOTE — Telephone Encounter (Signed)
Advised as written per Dr Ernst Bowler patient states she is not having those types of reactions it is more nasal. Patient states it is more like nasal congestion it is like her having something stuck in nose. Advised to use nasal saline rinses for a few days and if no relief call us back . Patient verbalized understanding.

## 2019-01-26 ENCOUNTER — Other Ambulatory Visit: Payer: Medicaid Other | Admitting: Adult Health

## 2019-01-28 ENCOUNTER — Encounter: Payer: Self-pay | Admitting: Allergy & Immunology

## 2019-01-28 ENCOUNTER — Ambulatory Visit: Payer: Medicaid Other | Admitting: Allergy & Immunology

## 2019-01-28 ENCOUNTER — Telehealth: Payer: Medicaid Other | Admitting: Allergy & Immunology

## 2019-01-28 ENCOUNTER — Encounter: Payer: Self-pay | Admitting: Family Medicine

## 2019-01-30 ENCOUNTER — Ambulatory Visit (INDEPENDENT_AMBULATORY_CARE_PROVIDER_SITE_OTHER): Payer: Medicaid Other | Admitting: Allergy & Immunology

## 2019-01-30 ENCOUNTER — Encounter: Payer: Self-pay | Admitting: Allergy & Immunology

## 2019-01-30 ENCOUNTER — Other Ambulatory Visit: Payer: Self-pay

## 2019-01-30 DIAGNOSIS — J302 Other seasonal allergic rhinitis: Secondary | ICD-10-CM

## 2019-01-30 DIAGNOSIS — T7800XD Anaphylactic reaction due to unspecified food, subsequent encounter: Secondary | ICD-10-CM | POA: Diagnosis not present

## 2019-01-30 NOTE — Progress Notes (Signed)
RE: Jaime Flores MRN: [id number] DOB: 10-08-1985 Date of Telemedicine Visit: 01/30/2019  Referring provider: [name], Fransisca Kaufmann, MD Primary care provider: [name], Fransisca Kaufmann, MD  Chief Complaint: Follow-up   Telemedicine Follow Up Visit via Telephone: I connected with [name] for a follow up on 01/30/19 by telephone and verified that I am speaking with the correct person using two identifiers.   I discussed the limitations, risks, security and privacy concerns of performing an evaluation and management service by telephone and the availability of in person appointments. I also discussed with the patient that there may be a patient responsible charge related to this service. The patient expressed understanding and agreed to proceed.  Patient is at home accompanied by her family who provided/contributed to the history.  Provider is at the office.  Visit start time: 3:33 PM Visit end time: 3:58 PM Insurance consent/check in by: [place] consent and medical assistant/nurse: Ashleigh   History of Present Illness:  She is a 33 y.o. female, who is being followed for a history of recurrent allergic . Her previous allergy office visit was in April 2020 with myself. At the last visit, she had experienced a reaction to an unknown agent. She had reacted to pizza as well as chicken nuggets. I added on cetirizine 10mg  daily to help calm all of this down, but we did consider doing additional laboratory workups.    Since the last visit, she has been having "allergy attacks" to dust. She has an automatic air freshener in her bathroom and this went off apparently and combined with the fan in the bathroom to create a rather intense vortex that lead to a coughing episode.   She did go eat [employer] and chicken and pineapple pizza since that time without any problems. She was pulling blinds and curtains down and washing them at her sister's home. She got "into a coughing fit" and she  was having problems with this. She took two Benadryl and she was fine. When they got to the second set of curtains she did fine. 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 Her attacks were typically before or during or after driving the bus. She wants to do some more extensive allergy testing for environmental allergens (previous sIgE testing to environmental allergy panel was negative).   She has been taking cetirizine 10mg  daily and this has helped with her symptoms. She seems to be able to tolerate these episodes better and they certainly do not last as long as they did before.   Otherwise, there have been no changes to her past medical history, surgical history, family history, or social history.  Assessment and Plan:  [name] is a 33 y.o. female with:  Idiopathic anaphylaxis - still unknown trigger   We are still unsure what is causing her reactions, but clearly it has nothing to do with the pizza and the chicken nuggets, as we thought previously. She has since tolerated these foods without a problem. The other reactions since the last visit has been related to dust environments, so she now is convinced that this has something to do with environmental allergens. We could certainly do more sensitive intradermal testing to look into this more. She is very interested in doing this and we have her scheduled for next Friday. She is aware to come to the [facility] office.   Diagnostics: None.  Medication List:  Current Outpatient Medications  Medication Sig Dispense Refill  . citalopram (  CELEXA) 40 MG tablet Take 1 tablet (40 mg total) by mouth daily. 30 tablet 3  . cyclobenzaprine (FLEXERIL) 10 MG tablet Take 1 tablet (10 mg total) by mouth 3 (three) times daily as needed for muscle spasms. 30 tablet 0  . diphenhydrAMINE (BENADRYL) 25 MG tablet Take 25 mg by mouth every 6 (six) hours as needed.    [name] EPINEPHrine 0.3 mg/0.3 mL IJ SOAJ injection Inject 0.3 mLs (0.3 mg total)  into the muscle as needed for anaphylaxis. 1 Device 1  . megestrol (MEGACE) 40 MG tablet Take 1 tablet (40 mg total) by mouth daily. (Patient taking differently: Take 40 mg by mouth daily as needed. ) 30 tablet 1  . traZODone (DESYREL) 50 MG tablet Take 0.5-1 tablets (25-50 mg total) by mouth at bedtime as needed for sleep. 30 tablet 3  . cetirizine (ZYRTEC) 10 MG tablet Take 1 tablet (10 mg total) by mouth daily for 30 days. 30 tablet 5   No current facility-administered medications for this visit.    Allergies: Allergies  Allergen Reactions  . Doxycycline Shortness Of Breath  . Wellbutrin [Bupropion] Shortness Of Breath, Nausea Only and Palpitations  . Tape Swelling    Plastic tape please use paper   I reviewed her past medical history, social history, family history, and environmental history and no significant changes have been reported from previous visits.  Review of Systems  Constitutional: Negative for activity change, appetite change, chills, diaphoresis, fatigue and fever.  HENT: Negative for congestion, postnasal drip, rhinorrhea, sinus pressure and sore throat.   Eyes: Negative for pain, discharge, redness and itching.  Respiratory: Negative for shortness of breath, wheezing and stridor.   Gastrointestinal: Negative for diarrhea, nausea and vomiting.  Endocrine: Negative for cold intolerance and heat intolerance.  Musculoskeletal: Negative for arthralgias, joint swelling and myalgias.  Skin: Negative for rash.  Allergic/Immunologic: Positive for environmental allergies and food allergies.    Objective:  Physical exam not obtained as encounter was done via telephone.   Previous notes and tests were reviewed.  I discussed the assessment and treatment plan with the patient. The patient was provided an opportunity to ask questions and all were answered. The patient agreed with the plan and demonstrated an understanding of the instructions.   The patient was advised to  call back or seek an in-person evaluation if the symptoms worsen or if the condition fails to improve as anticipated.  I provided 25 minutes of non-face-to-face time during this encounter.  It was my pleasure to participate in [place]'s care today. Please feel free to contact me with any questions or concerns.   Sincerely,  Valentina Shaggy, MD

## 2019-02-02 ENCOUNTER — Telehealth: Payer: Self-pay | Admitting: Adult Health

## 2019-02-02 NOTE — Telephone Encounter (Signed)
Pt states that she has found a lump on her left breast under her nipple. She said that it is tender to the touch.

## 2019-02-06 ENCOUNTER — Other Ambulatory Visit: Payer: Self-pay

## 2019-02-06 ENCOUNTER — Ambulatory Visit: Payer: Self-pay | Admitting: Allergy & Immunology

## 2019-02-06 ENCOUNTER — Ambulatory Visit (INDEPENDENT_AMBULATORY_CARE_PROVIDER_SITE_OTHER): Payer: Medicaid Other | Admitting: Adult Health

## 2019-02-06 ENCOUNTER — Encounter: Payer: Self-pay | Admitting: Adult Health

## 2019-02-06 VITALS — BP 103/64 | HR 65 | Ht 62.0 in | Wt 141.0 lb

## 2019-02-06 DIAGNOSIS — S20162A Insect bite (nonvenomous) of breast, left breast, initial encounter: Secondary | ICD-10-CM | POA: Diagnosis not present

## 2019-02-06 DIAGNOSIS — W57XXXA Bitten or stung by nonvenomous insect and other nonvenomous arthropods, initial encounter: Secondary | ICD-10-CM | POA: Insufficient documentation

## 2019-02-06 MED ORDER — SULFAMETHOXAZOLE-TRIMETHOPRIM 800-160 MG PO TABS: 1.0000 | ORAL_TABLET | Freq: Two times a day (BID) | ORAL | 0 refills | Status: DC

## 2019-02-06 NOTE — Progress Notes (Addendum)
Patient ID: Jaime Flores, female   DOB: January 26, 1986, 33 y.o.   MRN: [id number] History of Present Illness: Halle is a 33 year old white female, single in long term relationship, G2P1102, in complaining of possible left breast lump, noticed Sunday was more white and tender, and is more pink now and non tender. PCP is Dr Warrick Parisian.    Current Medications, Allergies, Past Medical History, Past Surgical History, Family History and Social History were reviewed in [employer] record.     Review of Systems: ?lump in left breast    Physical Exam:BP 103/64 (BP Location: Left Arm, Patient Position: Sitting, Cuff Size: Normal)   Pulse 65   Ht 5\' 2"  (1.575 m)   Wt 141 lb (64 kg)   LMP 12/25/2018 (Approximate)   BMI 25.79 kg/m  General:  Well developed, well nourished, no acute distress Skin:  Warm and dry Breast:  No dominant palpable mass, retraction, or nipple discharge, but has red raised area at about 8 0' clock left breast, in skin ?bug bite Psych:  No mood changes, alert and cooperative,seems happy Fall risk is low. Exam performed with permission with out a chaperone.   Impression: 1. Bug bite, initial encounter       Plan: will Rx antibiotic and recheck in 11 days Meds ordered this encounter  Medications  . sulfamethoxazole-trimethoprim (BACTRIM DS) 800-160 MG tablet    Sig: Take 1 tablet by mouth 2 (two) times daily. Take 1 bid    Dispense:  20 tablet    Refill:  0    Order Specific Question:   Supervising Provider    Answer:   Florian Buff [[id number]]  F/U 7/28  Can take benadryl or xyzal

## 2019-02-11 ENCOUNTER — Other Ambulatory Visit: Payer: Self-pay | Admitting: Family Medicine

## 2019-02-11 DIAGNOSIS — R51 Headache: Secondary | ICD-10-CM | POA: Diagnosis not present

## 2019-02-11 DIAGNOSIS — Z209 Contact with and (suspected) exposure to unspecified communicable disease: Secondary | ICD-10-CM | POA: Diagnosis not present

## 2019-02-11 DIAGNOSIS — M546 Pain in thoracic spine: Secondary | ICD-10-CM

## 2019-02-11 DIAGNOSIS — R42 Dizziness and giddiness: Secondary | ICD-10-CM | POA: Diagnosis not present

## 2019-02-11 MED ORDER — CYCLOBENZAPRINE HCL 10 MG PO TABS: 10.0000 mg | ORAL_TABLET | Freq: Three times a day (TID) | ORAL | 0 refills | Status: DC | PRN

## 2019-02-17 ENCOUNTER — Ambulatory Visit: Payer: Medicaid Other | Admitting: Adult Health

## 2019-02-18 ENCOUNTER — Ambulatory Visit: Payer: Medicaid Other | Admitting: Allergy & Immunology

## 2019-02-20 ENCOUNTER — Ambulatory Visit: Payer: Self-pay | Admitting: Allergy & Immunology

## 2019-04-01 ENCOUNTER — Ambulatory Visit: Payer: Medicaid Other | Admitting: Allergy & Immunology

## 2019-04-06 ENCOUNTER — Encounter: Payer: Self-pay | Admitting: Family Medicine

## 2019-04-06 DIAGNOSIS — G479 Sleep disorder, unspecified: Secondary | ICD-10-CM

## 2019-04-10 ENCOUNTER — Other Ambulatory Visit: Payer: Medicaid Other | Admitting: Adult Health

## 2019-04-20 ENCOUNTER — Encounter: Payer: Self-pay | Admitting: Family Medicine

## 2019-04-21 ENCOUNTER — Other Ambulatory Visit: Payer: Self-pay | Admitting: Family Medicine

## 2019-04-21 ENCOUNTER — Other Ambulatory Visit: Payer: Self-pay | Admitting: *Deleted

## 2019-04-21 ENCOUNTER — Encounter: Payer: Self-pay | Admitting: Family Medicine

## 2019-04-21 DIAGNOSIS — Z20822 Contact with and (suspected) exposure to covid-19: Secondary | ICD-10-CM

## 2019-04-21 DIAGNOSIS — F419 Anxiety disorder, unspecified: Secondary | ICD-10-CM

## 2019-04-21 MED ORDER — CITALOPRAM HYDROBROMIDE 40 MG PO TABS: 40.0000 mg | ORAL_TABLET | Freq: Every day | ORAL | 1 refills | Status: DC

## 2019-04-22 DIAGNOSIS — R457 State of emotional shock and stress, unspecified: Secondary | ICD-10-CM | POA: Diagnosis not present

## 2019-04-23 ENCOUNTER — Encounter: Payer: Self-pay | Admitting: Family Medicine

## 2019-04-23 ENCOUNTER — Ambulatory Visit (INDEPENDENT_AMBULATORY_CARE_PROVIDER_SITE_OTHER): Payer: Medicaid Other | Admitting: Family Medicine

## 2019-04-23 DIAGNOSIS — B9689 Other specified bacterial agents as the cause of diseases classified elsewhere: Secondary | ICD-10-CM | POA: Diagnosis not present

## 2019-04-23 DIAGNOSIS — J988 Other specified respiratory disorders: Secondary | ICD-10-CM

## 2019-04-23 LAB — NOVEL CORONAVIRUS, NAA: SARS-CoV-2, NAA: NOT DETECTED

## 2019-04-23 MED ORDER — AZITHROMYCIN 250 MG PO TABS: ORAL_TABLET | ORAL | 0 refills | Status: DC

## 2019-04-23 NOTE — Patient Instructions (Signed)

## 2019-04-23 NOTE — Progress Notes (Signed)
Virtual Visit via Telephone Note  I connected with Jaime Flores on [id number] at 10:35 AM by telephone and verified that I am speaking with the correct person using two identifiers. Jaime Flores is currently located at home and nobody is currently with her during this visit. The provider, Loman Brooklyn, FNP is located in their home at time of visit.  I discussed the limitations, risks, security and privacy concerns of performing an evaluation and management service by telephone and the availability of in person appointments. I also discussed with the patient that there may be a patient responsible charge related to this service. The patient expressed understanding and agreed to proceed.  Subjective: PCP: [name], Fransisca Kaufmann, MD  Chief Complaint  Patient presents with  . Sore Throat   Sore Throat: Patient complains of sore throat. Additional symptoms include cough, head congestion, sneezing, ear pain/pressure, postnasal drainage, nausea and loss of taste. Onset of symptoms was 5 days ago, gradually worsening since that time. She is drinking plenty of fluids. Treatment to date: none. She has a history of allergies. She does smoke. Patient had a negative COVID test two days ago.    ROS: Per HPI  Current Outpatient Medications:  .  cetirizine (ZYRTEC) 10 MG tablet, Take 1 tablet (10 mg total) by mouth daily for 30 days., Disp: 30 tablet, Rfl: 5 .  citalopram (CELEXA) 40 MG tablet, Take 1 tablet (40 mg total) by mouth daily., Disp: 30 tablet, Rfl: 1 .  cyclobenzaprine (FLEXERIL) 10 MG tablet, Take 1 tablet (10 mg total) by mouth 3 (three) times daily as needed for muscle spasms., Disp: 30 tablet, Rfl: 0 .  diphenhydrAMINE (BENADRYL) 25 MG tablet, Take 25 mg by mouth every 6 (six) hours as needed., Disp: , Rfl:  .  EPINEPHrine 0.3 mg/0.3 mL IJ SOAJ injection, Inject 0.3 mLs (0.3 mg total) into the muscle as needed for anaphylaxis. (Patient not taking: Reported on 02/06/2019), Disp: 1  Device, Rfl: 1 .  megestrol (MEGACE) 40 MG tablet, Take 1 tablet (40 mg total) by mouth daily. (Patient taking differently: Take 40 mg by mouth daily as needed. ), Disp: 30 tablet, Rfl: 1 .  traZODone (DESYREL) 50 MG tablet, Take 0.5-1 tablets (25-50 mg total) by mouth at bedtime as needed for sleep., Disp: 30 tablet, Rfl: 3  Allergies  Allergen Reactions  . Doxycycline Shortness Of Breath  . Wellbutrin [Bupropion] Shortness Of Breath, Nausea Only and Palpitations  . Tape Swelling    Plastic tape please use paper   Past Medical History:  Diagnosis Date  . Amenorrhea 01/27/2013  . Angio-edema   . Breast pain 03/10/2015  . Encounter for menstrual regulation 07/05/2015  . Irregular intermenstrual bleeding 07/28/2015  . Irregular periods 07/05/2015  . Screening for STD (sexually transmitted disease) 08/07/2013  . Urticaria   . UTI (urinary tract infection)   . Vaginal discharge 06/10/2013    Observations/Objective: A&O  No respiratory distress or wheezing audible over the phone. She does have an audible cough. Mood, judgement, and thought processes all WNL  Assessment and Plan: 1. Bacterial respiratory infection - Education provided on symptom management.  - azithromycin (ZITHROMAX Z-PAK) 250 MG tablet; Take 2 tablets (500 mg) PO today, then 1 tablet (250 mg) PO daily x4 days.  Dispense: 6 tablet; Refill: 0   Follow Up Instructions:  I discussed the assessment and treatment plan with the patient. The patient was provided an opportunity to ask questions and all were answered. The  patient agreed with the plan and demonstrated an understanding of the instructions.   The patient was advised to call back or seek an in-person evaluation if the symptoms worsen or if the condition fails to improve as anticipated.  The above assessment and management plan was discussed with the patient. The patient verbalized understanding of and has agreed to the management plan. Patient is aware to call the  clinic if symptoms persist or worsen. Patient is aware when to return to the clinic for a follow-up visit. Patient educated on when it is appropriate to go to the emergency department.   Time call ended: 10:50 AM  I provided 15 minutes of non-face-to-face time during this encounter.  Hendricks Limes, MSN, APRN, FNP-C [facility] 04/23/19

## 2019-05-06 ENCOUNTER — Encounter: Payer: Self-pay | Admitting: Physician Assistant

## 2019-05-06 ENCOUNTER — Ambulatory Visit (INDEPENDENT_AMBULATORY_CARE_PROVIDER_SITE_OTHER): Payer: Medicaid Other | Admitting: Physician Assistant

## 2019-05-06 DIAGNOSIS — R112 Nausea with vomiting, unspecified: Secondary | ICD-10-CM

## 2019-05-06 MED ORDER — PROMETHAZINE HCL 25 MG PO TABS: 25.0000 mg | ORAL_TABLET | Freq: Three times a day (TID) | ORAL | 0 refills | Status: DC | PRN

## 2019-05-06 NOTE — Progress Notes (Signed)
Telephone visit  Subjective: [id number] PCP: [name], Fransisca Kaufmann, MD [id number] Jaime Flores is a 33 y.o. female calls for telephone consult today. Patient provides verbal consent for consult held via phone.  Patient is identified with 2 separate identifiers.  At this time the entire area is on COVID-19 social distancing and stay home orders are in place.  Patient is of higher risk and therefore we are performing this by a virtual method.  Location of patient: home Location of provider: HOME Others present for call: no  Vomiting  2 days  Had nausea yesterday and started vomiting  Some hot flashes with the vomiting, did it 5 times yesterday. This nausea she was very nausea. No chance she was pregnant. She has not had any chance of COVID exposure that she is aware. 2012 had gall bladder removed.    Got up again this morning and was nauseated but no vomiting yet.   ROS: Per HPI  Allergies  Allergen Reactions  . Doxycycline Shortness Of Breath  . Wellbutrin [Bupropion] Shortness Of Breath, Nausea Only and Palpitations  . Tape Swelling    Plastic tape please use paper   Past Medical History:  Diagnosis Date  . Amenorrhea 01/27/2013  . Angio-edema   . Breast pain 03/10/2015  . Encounter for menstrual regulation 07/05/2015  . Irregular intermenstrual bleeding 07/28/2015  . Irregular periods 07/05/2015  . Screening for STD (sexually transmitted disease) 08/07/2013  . Urticaria   . UTI (urinary tract infection)   . Vaginal discharge 06/10/2013    Current Outpatient Medications:  .  cetirizine (ZYRTEC) 10 MG tablet, Take 1 tablet (10 mg total) by mouth daily for 30 days., Disp: 30 tablet, Rfl: 5 .  citalopram (CELEXA) 40 MG tablet, Take 1 tablet (40 mg total) by mouth daily., Disp: 30 tablet, Rfl: 1 .  cyclobenzaprine (FLEXERIL) 10 MG tablet, Take 1 tablet (10 mg total) by mouth 3 (three) times daily as needed for muscle spasms., Disp: 30 tablet, Rfl: 0 .  diphenhydrAMINE  (BENADRYL) 25 MG tablet, Take 25 mg by mouth every 6 (six) hours as needed., Disp: , Rfl:  .  EPINEPHrine 0.3 mg/0.3 mL IJ SOAJ injection, Inject 0.3 mLs (0.3 mg total) into the muscle as needed for anaphylaxis. (Patient not taking: Reported on 02/06/2019), Disp: 1 Device, Rfl: 1 .  megestrol (MEGACE) 40 MG tablet, Take 1 tablet (40 mg total) by mouth daily. (Patient taking differently: Take 40 mg by mouth daily as needed. ), Disp: 30 tablet, Rfl: 1 .  promethazine (PHENERGAN) 25 MG tablet, Take 1 tablet (25 mg total) by mouth every 8 (eight) hours as needed for nausea or vomiting., Disp: 30 tablet, Rfl: 0 .  traZODone (DESYREL) 50 MG tablet, Take 0.5-1 tablets (25-50 mg total) by mouth at bedtime as needed for sleep., Disp: 30 tablet, Rfl: 3  Assessment/ Plan: 33 y.o. female   1. Nausea and vomiting, intractability of vomiting not specified, unspecified vomiting type - promethazine (PHENERGAN) 25 MG tablet; Take 1 tablet (25 mg total) by mouth every 8 (eight) hours as needed for nausea or vomiting.  Dispense: 30 tablet; Refill: 0   No follow-ups on file.  Continue all other maintenance medications as listed above.  Start time: 8:27 AM End time: 8:37 AM  Meds ordered this encounter  Medications  . promethazine (PHENERGAN) 25 MG tablet    Sig: Take 1 tablet (25 mg total) by mouth every 8 (eight) hours as needed for nausea or vomiting.  Dispense:  30 tablet    Refill:  0    Order Specific Question:   Supervising Provider    Answer:   Janora Norlander [id number]    Particia Nearing PA-C [place] [facility] [phone]

## 2019-05-07 ENCOUNTER — Other Ambulatory Visit: Payer: Self-pay

## 2019-05-07 ENCOUNTER — Ambulatory Visit (INDEPENDENT_AMBULATORY_CARE_PROVIDER_SITE_OTHER): Payer: Medicaid Other | Admitting: Family

## 2019-05-07 ENCOUNTER — Encounter: Payer: Self-pay | Admitting: Family

## 2019-05-07 DIAGNOSIS — R509 Fever, unspecified: Secondary | ICD-10-CM | POA: Diagnosis not present

## 2019-05-07 DIAGNOSIS — Z20822 Contact with and (suspected) exposure to covid-19: Secondary | ICD-10-CM

## 2019-05-07 DIAGNOSIS — Z20828 Contact with and (suspected) exposure to other viral communicable diseases: Secondary | ICD-10-CM | POA: Diagnosis not present

## 2019-05-07 DIAGNOSIS — R197 Diarrhea, unspecified: Secondary | ICD-10-CM

## 2019-05-07 NOTE — Progress Notes (Signed)
   Virtual Visit via telephone Note Due to COVID-19 pandemic this visit was conducted virtually. This visit type was conducted due to national recommendations for restrictions regarding the COVID-19 Pandemic (e.g. social distancing, sheltering in place) in an effort to limit this patient's exposure and mitigate transmission in our community. All issues noted in this document were discussed and addressed.  A physical exam was not performed with this format.  I connected with Jaime Flores on [id number] at 11:50 AM by telephone and verified that I am speaking with the correct person using two identifiers. Jaime Flores is currently located at home and son is currently with her during visit. The provider, Evelina Dun, FNP is located in their office at time of visit.  I discussed the limitations, risks, security and privacy concerns of performing an evaluation and management service by telephone and the availability of in person appointments. I also discussed with the patient that there may be a patient responsible charge related to this service. The patient expressed understanding and agreed to proceed.   History and Present Illness:  Fever  This is a new problem. Episode frequency: 102 F. The problem has been gradually worsening. Associated symptoms include diarrhea. Pertinent negatives include no congestion, coughing, ear pain, headaches, muscle aches, nausea, sleepiness, sore throat or wheezing. She has tried acetaminophen for the symptoms. The treatment provided mild relief.      Review of Systems  Constitutional: Positive for fever.  HENT: Negative for congestion, ear pain and sore throat.   Respiratory: Negative for cough and wheezing.   Gastrointestinal: Positive for diarrhea. Negative for nausea.  Neurological: Negative for headaches.  All other systems reviewed and are negative.    Observations/Objective: No SOB or distress noted  Assessment and Plan: 1. Fever,  unspecified fever cause  2. Diarrhea, unspecified type  Pt will go get COVID tested today Force fluids Tylenol as needed Rest Self isolate until test results return Work note given      I discussed the assessment and treatment plan with the patient. The patient was provided an opportunity to ask questions and all were answered. The patient agreed with the plan and demonstrated an understanding of the instructions.   The patient was advised to call back or seek an in-person evaluation if the symptoms worsen or if the condition fails to improve as anticipated.  The above assessment and management plan was discussed with the patient. The patient verbalized understanding of and has agreed to the management plan. Patient is aware to call the clinic if symptoms persist or worsen. Patient is aware when to return to the clinic for a follow-up visit. Patient educated on when it is appropriate to go to the emergency department.   Time call ended:  11:58 AM  I provided 8 minutes of non-face-to-face time during this encounter.    Evelina Dun, FNP

## 2019-05-08 ENCOUNTER — Encounter: Payer: Self-pay | Admitting: Family Medicine

## 2019-05-08 LAB — NOVEL CORONAVIRUS, NAA: SARS-CoV-2, NAA: NOT DETECTED

## 2019-05-15 ENCOUNTER — Other Ambulatory Visit: Payer: Self-pay

## 2019-05-15 ENCOUNTER — Encounter: Payer: Self-pay | Admitting: Allergy & Immunology

## 2019-05-15 ENCOUNTER — Ambulatory Visit (INDEPENDENT_AMBULATORY_CARE_PROVIDER_SITE_OTHER): Payer: Medicaid Other | Admitting: Allergy & Immunology

## 2019-05-15 VITALS — BP 102/70 | HR 82 | Temp 98.5°F | Resp 18

## 2019-05-15 DIAGNOSIS — J302 Other seasonal allergic rhinitis: Secondary | ICD-10-CM | POA: Insufficient documentation

## 2019-05-15 DIAGNOSIS — R05 Cough: Secondary | ICD-10-CM

## 2019-05-15 DIAGNOSIS — J3089 Other allergic rhinitis: Secondary | ICD-10-CM | POA: Insufficient documentation

## 2019-05-15 DIAGNOSIS — R059 Cough, unspecified: Secondary | ICD-10-CM | POA: Insufficient documentation

## 2019-05-15 DIAGNOSIS — T7840XD Allergy, unspecified, subsequent encounter: Secondary | ICD-10-CM

## 2019-05-15 DIAGNOSIS — T7800XA Anaphylactic reaction due to unspecified food, initial encounter: Secondary | ICD-10-CM | POA: Insufficient documentation

## 2019-05-15 MED ORDER — ALBUTEROL SULFATE HFA 108 (90 BASE) MCG/ACT IN AERS: INHALATION_SPRAY | RESPIRATORY_TRACT | 2 refills | Status: DC

## 2019-05-15 NOTE — Patient Instructions (Addendum)
1. 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 - Copy of test results provided. - Avoidance measures provided.  - EpiPen use reviewed. - Continue with cetirizine 10mg  daily since this is keeping all of the symptoms at [place]. - A spirometry was normal (breathing test), but we are going to add on albuterol 4 puffs during respiratory flares/wheezing to use as needed during reactions to open up your lungs. - Spacer sample and teaching provided.   3. Return in about 6 months (around 11/13/2019). This can be an in-person, a virtual Webex or a telephone follow up visit.   Please inform us of any Emergency Department visits, hospitalizations, or changes in symptoms. Call us before going to the ED for breathing or allergy symptoms since we might be able to fit you in for a sick visit. Feel free to contact us anytime with any questions, problems, or concerns.  It was a pleasure to talk to you today today!  Websites that have reliable patient information: 1. American Academy of Asthma, Allergy, and Immunology: www.aaaai.org 2. Food Allergy Research and Education (FARE): foodallergy.org 3. Mothers of Asthmatics: http://www.asthmacommunitynetwork.org 4. American College of Allergy, Asthma, and Immunology: www.acaai.org  "Like" [place] on Facebook and Instagram for our latest updates!      Make sure you are registered to vote! If you have moved or changed any of your contact information, you will need to get this updated before voting!    Voter ID laws are NOT going into effect for the General Election in November 2020! DO NOT let this stop you from exercising your right to vote!   Reducing Pollen Exposure  The American Academy of Allergy, Asthma and Immunology suggests the following steps to reduce your exposure to pollen during allergy seasons.    1. Do not hang sheets or clothing out to dry; pollen may collect on  these items. 2. Do not mow lawns or spend time around freshly cut grass; mowing stirs up pollen. 3. Keep windows closed at night.  Keep car windows closed while driving. 4. Minimize morning activities outdoors, a time when pollen counts are usually at their highest. 5. Stay indoors as much as possible when pollen counts or humidity is high and on windy days when pollen tends to remain in the air longer. 6. Use air conditioning when possible.  Many air conditioners have filters that trap the pollen spores. 7. Use a HEPA room air filter to remove pollen form the indoor air you breathe.  Control of Mold Allergen   Mold and fungi can grow on a variety of surfaces provided certain temperature and moisture conditions exist.  Outdoor molds grow on plants, decaying vegetation and soil.  The major outdoor mold, Alternaria and Cladosporium, are found in very high numbers during hot and dry conditions.  Generally, a late Summer - Fall peak is seen for common outdoor fungal spores.  Rain will temporarily lower outdoor mold spore count, but counts rise rapidly when the rainy period ends.  The most important indoor molds are Aspergillus and Penicillium.  Dark, humid and poorly ventilated basements are ideal sites for mold growth.  The next most common sites of mold growth are the bathroom and the kitchen.  Outdoor (Seasonal) Mold Control  Positive outdoor molds via skin testing: Alternaria, Cladosporium, Bipolaris (Helminthsporium), Drechslera (Curvalaria) and Mucor  1. Use air conditioning and keep windows closed 2. Avoid exposure to decaying vegetation. 3. Avoid leaf  raking. 4. Avoid grain handling. 5. Consider wearing a face mask if working in moldy areas.  6.   Indoor (Perennial) Mold Control   Positive indoor molds via skin testing: Aspergillus, Penicillium, Fusarium, Aureobasidium (Pullulara) and Rhizopus  1. Maintain humidity below 50%. 2. Clean washable surfaces with 5% bleach solution. 3.  Remove sources e.g. contaminated carpets.     Control of Dog or Cat Allergen  Avoidance is the best way to manage a dog or cat allergy. If you have a dog or cat and are allergic to dog or cats, consider removing the dog or cat from the home. If you have a dog or cat but don't want to find it a new home, or if your family wants a pet even though someone in the household is allergic, here are some strategies that may help keep symptoms at bay:  1. Keep the pet out of your bedroom and restrict it to only a few rooms. Be advised that keeping the dog or cat in only one room will not limit the allergens to that room. 2. Don't pet, hug or kiss the dog or cat; if you do, wash your hands with soap and water. 3. High-efficiency particulate air (HEPA) cleaners run continuously in a bedroom or living room can reduce allergen levels over time. 4. Regular use of a high-efficiency vacuum cleaner or a central vacuum can reduce allergen levels. 5. Giving your dog or cat a bath at least once a week can reduce airborne allergen.  Control of Cockroach Allergen  Cockroach allergen has been identified as an important cause of acute attacks of asthma, especially in urban settings.  There are fifty-five species of cockroach that exist in the [place], however only three, the [place], [place] species produce allergen that can affect patients with Asthma.  Allergens can be obtained from fecal particles, egg casings and secretions from cockroaches.    1. Remove food sources. 2. Reduce access to water. 3. Seal access and entry points. 4. Spray runways with 0.5-1% Diazinon or Chlorpyrifos 5. Blow boric acid power under stoves and refrigerator. 6. Place bait stations (hydramethylnon) at feeding sites.

## 2019-05-15 NOTE — Progress Notes (Addendum)
FOLLOW UP  Date of Service/Encounter:  05/15/19   Assessment:   Anaphylactic shock due to food  Seasonal and perennial allergic rhinitis (grasses, ragweed, molds, cat, and cockroach)  Cough - with normal spirometry today  Plan/Recommendations:   1. 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 - Copy of test results provided. - Avoidance measures provided.  - EpiPen use reviewed. - Continue with cetirizine 10mg  daily since this is keeping all of the symptoms at [place]. - A spirometry was normal (breathing test), but we are going to add on albuterol 4 puffs during respiratory flares/wheezing to use as needed during reactions to open up your lungs. - Spacer sample and teaching provided.  - We could consider the use of allergen immunotherapy, but since her symptoms are controlled with cetirizine daily, we will just continue with that for now.   3. Return in about 6 months (around 11/13/2019). This can be an in-person, a virtual Webex or a telephone follow up visit.   Subjective:   Jaime Flores is a 33 y.o. female presenting today for follow up of  Chief Complaint  Patient presents with  . Allergy Testing    Verbalizes having an "allergy attack" this morning with cough and chest tightening.    Jaime Flores has a history of the following: Patient Active Problem List   Diagnosis Date Noted  . 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 Cough 05/15/2019  . Seasonal and perennial allergic rhinitis 05/15/2019  . Bug bite 02/06/2019  . Hemorrhoids 09/26/2016  . Irregular intermenstrual bleeding 07/28/2015  . Amenorrhea 01/27/2013    History obtained from: chart review and patient.  Jaime Flores is a 33 y.o. female presenting for skin testing.  She was last seen in July 2010.  At that time, she was doing well with cetirizine 10 mg daily to suppress her reactions.  She was  previously concerned it was related to foods, but she ended up eating pizza and chicken nuggets without any reactions.  At the last visit, she was concerned that it was related to an environmental trigger since it seemed to occur during dusty environments.  Her previous serum specific IgE testing was negative to environmental allergy panel, but she wanted to come in for intradermal testing.  Since the last visit, she has mostly done well. 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 She tells me that she did fine with some Benadryl, but this was over three days ago. Her reactions consist of throat irritation and respiratory complaints (wheezing, shortness of breath).   She did have environmental allergy testing via the blood that was non-reactive to the entire panel.   Otherwise, there have been no changes to her past medical history, surgical history, family history, or social history.    Review of Systems  Constitutional: Negative.  Negative for chills, fever, malaise/fatigue and weight loss.  HENT: Negative.  Negative for congestion, ear discharge and ear pain.   Eyes: Negative for pain, discharge and redness.  Respiratory: Negative for cough, sputum production, shortness of breath and wheezing.   Cardiovascular: Negative.  Negative for chest pain and palpitations.  Gastrointestinal: Negative for abdominal pain, constipation, diarrhea, heartburn, nausea and vomiting.  Skin: Negative.  Negative for itching and rash.  Neurological: Negative for dizziness and headaches.  Endo/Heme/Allergies: Negative for environmental allergies. Does not bruise/bleed  easily.       Objective:   Blood pressure 102/70, pulse 82, temperature 98.5 F (36.9 C), resp. rate 18, SpO2 97 %. There is no height or weight on file to calculate BMI.   Physical Exam:  Physical Exam  Constitutional: She appears well-developed.  HENT:  Head: Normocephalic and  atraumatic.  Right Ear: Tympanic membrane, external ear and ear canal normal.  Left Ear: Tympanic membrane, external ear and ear canal normal.  Nose: Mucosal edema and rhinorrhea present. No nasal deformity or septal deviation. No epistaxis. Right sinus exhibits no maxillary sinus tenderness and no frontal sinus tenderness. Left sinus exhibits no maxillary sinus tenderness and no frontal sinus tenderness.  Mouth/Throat: Uvula is midline and oropharynx is clear and moist. Mucous membranes are not pale and not dry.  No epistaxis noted. No obvious deformity noted.   Eyes: Pupils are equal, round, and reactive to light. Conjunctivae and EOM are normal. Right eye exhibits no chemosis and no discharge. Left eye exhibits no chemosis and no discharge. Right conjunctiva is not injected. Left conjunctiva is not injected.  Cardiovascular: Normal rate, regular rhythm and normal heart sounds.  Respiratory: Effort normal and breath sounds normal. No accessory muscle usage. No tachypnea. No respiratory distress. She has no wheezes. She has no rhonchi. She has no rales. She exhibits no tenderness.  Moving air well in all lung fields. No increased work of breathing.   Lymphadenopathy:    She has no cervical adenopathy.  Neurological: She is alert.  Skin: No abrasion, no petechiae and no rash noted. Rash is not papular, not vesicular and not urticarial. No erythema. No pallor.  No eczematous or urticarial lesions noted.   Psychiatric: She has a normal mood and affect.     Diagnostic studies:     Spirometry: Normal FEV1, FVC, and FEV1/FVC ratio. There is no scooping suggestive of obstructive disease.     Allergy Studies:    Intradermal - 05/15/19 0941    Time Antigen Placed  0941    Allergen Manufacturer  Lavella Hammock    Location  Arm    Number of Test  15    Intradermal  Select    Control  Negative    [place]  1+    [place]  1+    7 Grass  Negative    Ragweed mix  1+    Weed mix  Negative    Tree mix   2+    Mold 1  1+    Mold 2  1+    Mold 3  1+    Mold 4  2+    Cat  2+    Dog  Negative    Cockroach  1+    Mite mix  Negative       Allergy testing results were read and interpreted by myself, documented by clinical staff.      Salvatore Marvel, MD  [facility]

## 2019-05-19 ENCOUNTER — Encounter: Payer: Self-pay | Admitting: Physician Assistant

## 2019-05-19 NOTE — Telephone Encounter (Signed)
Okay for note

## 2019-05-20 NOTE — Addendum Note (Signed)
Addended by: Chip Boer R on: 05/20/2019 09:12 AM   Modules accepted: Orders

## 2019-05-20 NOTE — Addendum Note (Signed)
Addended by: Chip Boer R on: 05/20/2019 08:48 AM   Modules accepted: Orders

## 2019-05-25 ENCOUNTER — Encounter: Payer: Self-pay | Admitting: Allergy & Immunology

## 2019-05-25 ENCOUNTER — Other Ambulatory Visit: Payer: Self-pay | Admitting: *Deleted

## 2019-05-25 MED ORDER — CETIRIZINE HCL 10 MG PO TABS: 10.0000 mg | ORAL_TABLET | Freq: Every day | ORAL | 5 refills | Status: DC

## 2019-05-29 ENCOUNTER — Ambulatory Visit: Payer: Medicaid Other | Admitting: Physician Assistant

## 2019-06-02 ENCOUNTER — Other Ambulatory Visit: Payer: Self-pay | Admitting: Family Medicine

## 2019-06-02 ENCOUNTER — Encounter: Payer: Self-pay | Admitting: Family Medicine

## 2019-06-02 ENCOUNTER — Other Ambulatory Visit: Payer: Self-pay | Admitting: *Deleted

## 2019-06-02 DIAGNOSIS — F419 Anxiety disorder, unspecified: Secondary | ICD-10-CM

## 2019-06-02 MED ORDER — CITALOPRAM HYDROBROMIDE 40 MG PO TABS: 40.0000 mg | ORAL_TABLET | Freq: Every day | ORAL | 1 refills | Status: DC

## 2019-06-03 ENCOUNTER — Ambulatory Visit (INDEPENDENT_AMBULATORY_CARE_PROVIDER_SITE_OTHER): Payer: Medicaid Other | Admitting: Adult Health

## 2019-06-03 ENCOUNTER — Other Ambulatory Visit: Payer: Self-pay

## 2019-06-03 ENCOUNTER — Encounter: Payer: Self-pay | Admitting: Adult Health

## 2019-06-03 ENCOUNTER — Other Ambulatory Visit (HOSPITAL_COMMUNITY)
Admission: RE | Admit: 2019-06-03 | Discharge: 2019-06-03 | Disposition: A | Discharge status: Home / Self Care | Payer: Medicaid Other | Source: Ambulatory Visit | Attending: Adult Health | Admitting: Adult Health

## 2019-06-03 VITALS — BP 108/67 | HR 66 | Ht 62.5 in | Wt 146.0 lb

## 2019-06-03 DIAGNOSIS — R87610 Atypical squamous cells of undetermined significance on cytologic smear of cervix (ASC-US): Secondary | ICD-10-CM | POA: Diagnosis not present

## 2019-06-03 DIAGNOSIS — Z01419 Encounter for gynecological examination (general) (routine) without abnormal findings: Secondary | ICD-10-CM | POA: Insufficient documentation

## 2019-06-03 DIAGNOSIS — Z0000 Encounter for general adult medical examination without abnormal findings: Secondary | ICD-10-CM | POA: Diagnosis not present

## 2019-06-03 NOTE — Progress Notes (Signed)
Patient ID: Jaime Flores, female   DOB: 1985-09-15, 33 y.o.   MRN: [id number] History of Present Illness:  Jaime Flores is a 33 year old white female, single, G2P1102, in for a well woman gyn exam and pap. PCP is Dr Warrick Parisian.  Current Medications, Allergies, Past Medical History, Past Surgical History, Family History and Social History were reviewed in [employer] record.     Review of Systems:  Patient denies any headaches, hearing loss, fatigue, blurred vision, shortness of breath, chest pain, abdominal pain, problems with bowel movements, urination, or intercourse. No joint pain or mood swings. Having some hot flashes    Physical Exam:BP 108/67 (BP Location: Left Arm, Patient Position: Sitting, Cuff Size: Normal)   Pulse 66   Ht 5' 2.5" (1.588 m)   Wt 146 lb (66.2 kg)   LMP 05/15/2019 (Exact Date)   BMI 26.28 kg/m  General:  Well developed, well nourished, no acute distress Skin:  Warm and dry Neck:  Midline trachea, normal thyroid, good ROM, no lymphadenopathy Lungs; Clear to auscultation bilaterally Breast:  No dominant palpable mass, retraction, or nipple discharge Cardiovascular: Regular rate and rhythm Abdomen:  Soft, non tender, no hepatosplenomegaly Pelvic:  External genitalia is normal in appearance, no lesions.  The vagina is normal in appearance. Urethra has no lesions or masses. The cervix is bulbous, pap with GC/CHL and high risk HPV 16/18genotyping performed.  Uterus is felt to be normal size, shape, and contour.  No adnexal masses or tenderness noted.Bladder is non tender, no masses felt. Extremities/musculoskeletal:  No swelling or varicosities noted, no clubbing or cyanosis Psych:  No mood changes, alert and cooperative,seems happy Fall risk is low PHQ 2 score is 0. Co exam with Weyman Croon FNP student.  Impression and plan: 1. Encounter for gynecological examination with Papanicolaou smear of cervix Pap sent Physical in 1 year Pap in 3  years if normal Labs with PCP

## 2019-06-05 LAB — CYTOLOGY - PAP
Adequacy: ABSENT
Chlamydia: NEGATIVE
Comment: NEGATIVE
Comment: NEGATIVE
Comment: NORMAL
Diagnosis: UNDETERMINED — AB
High risk HPV: NEGATIVE
Neisseria Gonorrhea: NEGATIVE

## 2019-06-29 ENCOUNTER — Other Ambulatory Visit: Payer: Self-pay

## 2019-06-29 ENCOUNTER — Ambulatory Visit
Admission: EM | Admit: 2019-06-29 | Discharge: 2019-06-29 | Disposition: A | Discharge status: Home / Self Care | Payer: Medicaid Other | Attending: Emergency Medicine | Admitting: Emergency Medicine

## 2019-06-29 ENCOUNTER — Ambulatory Visit (INDEPENDENT_AMBULATORY_CARE_PROVIDER_SITE_OTHER): Payer: Medicaid Other | Admitting: Family Medicine

## 2019-06-29 ENCOUNTER — Encounter: Payer: Self-pay | Admitting: Family Medicine

## 2019-06-29 DIAGNOSIS — J020 Streptococcal pharyngitis: Secondary | ICD-10-CM | POA: Diagnosis not present

## 2019-06-29 DIAGNOSIS — Z20828 Contact with and (suspected) exposure to other viral communicable diseases: Secondary | ICD-10-CM | POA: Diagnosis not present

## 2019-06-29 DIAGNOSIS — R6889 Other general symptoms and signs: Secondary | ICD-10-CM

## 2019-06-29 DIAGNOSIS — R197 Diarrhea, unspecified: Secondary | ICD-10-CM

## 2019-06-29 DIAGNOSIS — Z20822 Contact with and (suspected) exposure to covid-19: Secondary | ICD-10-CM

## 2019-06-29 DIAGNOSIS — J029 Acute pharyngitis, unspecified: Secondary | ICD-10-CM

## 2019-06-29 LAB — POCT INFLUENZA A/B
Influenza A, POC: NEGATIVE
Influenza B, POC: NEGATIVE

## 2019-06-29 LAB — POC SARS CORONAVIRUS 2 AG -  ED: SARS Coronavirus 2 Ag: NEGATIVE

## 2019-06-29 MED ORDER — AMOXICILLIN 875 MG PO TABS: 875.0000 mg | ORAL_TABLET | Freq: Two times a day (BID) | ORAL | 0 refills | Status: AC

## 2019-06-29 MED ORDER — BENZONATATE 100 MG PO CAPS: 100.0000 mg | ORAL_CAPSULE | Freq: Three times a day (TID) | ORAL | 0 refills | Status: DC

## 2019-06-29 NOTE — ED Provider Notes (Signed)
[facility]   [id number] 06/29/19 Arrival Time: 8916   CC: COVID symptoms  SUBJECTIVE: History from: patient.  Jaime Flores is a 33 y.o. female who presents with sore throat, fatigue, dry cough, and approximately "25-30" episodes of diarrhea that began 1 day ago.  Admits to possible secondary exposure to COVID.  Denies recent travel, antibiotic use, close contacts with similar symptoms, or diet changes.  Has tried immodium with relief.  States she has not had any episodes of diarrhea today.  Symptoms are made worse in the morning and using the restroom.  Reports previous symptoms in the past related to the flu.  Complains of associated congestion, nausea, and 1 episode of vomiting.   Denies fever, chills, rhinorrhea, SOB, wheezing, chest pain, changes in bladder habits.    ROS: As per HPI.  All other pertinent ROS negative.     Past Medical History:  Diagnosis Date  . Amenorrhea 01/27/2013  . Angio-edema   . Breast pain 03/10/2015  . Encounter for menstrual regulation 07/05/2015  . Irregular intermenstrual bleeding 07/28/2015  . Irregular periods 07/05/2015  . Screening for STD (sexually transmitted disease) 08/07/2013  . Urticaria   . UTI (urinary tract infection)   . Vaginal discharge 06/10/2013   Past Surgical History:  Procedure Laterality Date  . APPENDECTOMY    . CHOLECYSTECTOMY    . TUBAL LIGATION     Allergies  Allergen Reactions  . Doxycycline Shortness Of Breath  . Wellbutrin [Bupropion] Shortness Of Breath, Nausea Only and Palpitations  . Tape Swelling    Plastic tape please use paper   No current facility-administered medications on file prior to encounter.    Current Outpatient Medications on File Prior to Encounter  Medication Sig Dispense Refill  . albuterol (VENTOLIN HFA) 108 (90 Base) MCG/ACT inhaler Use 4 puffs every 4-6 hours as needed for cough or wheeze. 18 g 2  . amoxicillin (AMOXIL) 875 MG tablet Take 1 tablet (875 mg total) by mouth 2  (two) times daily for 10 days. 20 tablet 0  . cetirizine (ZYRTEC) 10 MG tablet Take 1 tablet (10 mg total) by mouth daily. 30 tablet 5  . citalopram (CELEXA) 40 MG tablet Take 1 tablet (40 mg total) by mouth daily. 30 tablet 1  . cyclobenzaprine (FLEXERIL) 10 MG tablet Take 1 tablet (10 mg total) by mouth 3 (three) times daily as needed for muscle spasms. 30 tablet 0  . diphenhydrAMINE (BENADRYL) 25 MG tablet Take 25 mg by mouth every 6 (six) hours as needed.    [name] EPINEPHrine 0.3 mg/0.3 mL IJ SOAJ injection Inject 0.3 mLs (0.3 mg total) into the muscle as needed for anaphylaxis. 1 Device 1  . megestrol (MEGACE) 40 MG tablet Take 1 tablet (40 mg total) by mouth daily. (Patient taking differently: Take 40 mg by mouth daily as needed. ) 30 tablet 1  . promethazine (PHENERGAN) 25 MG tablet Take 1 tablet (25 mg total) by mouth every 8 (eight) hours as needed for nausea or vomiting. 30 tablet 0  . traZODone (DESYREL) 50 MG tablet Take 0.5-1 tablets (25-50 mg total) by mouth at bedtime as needed for sleep. 30 tablet 3   Social History   Socioeconomic History  . Marital status: Single    Spouse name: Not on file  . Number of children: 2  . Years of education: Not on file  . Highest education level: Not on file  Occupational History  . Not on file  Social Needs  .  Financial resource strain: Not on file  . Food insecurity    Worry: Not on file    Inability: Not on file  . Transportation needs    Medical: Not on file    Non-medical: Not on file  Tobacco Use  . Smoking status: Current Every Day Smoker    Packs/day: 0.50    Types: Cigarettes  . Smokeless tobacco: Never Used  Substance and Sexual Activity  . Alcohol use: No  . Drug use: No  . Sexual activity: Yes    Birth control/protection: Surgical    Comment: tubal  Lifestyle  . Physical activity    Days per week: Not on file    Minutes per session: Not on file  . Stress: Not on file  Relationships  . Social Herbalist on  phone: Not on file    Gets together: Not on file    Attends religious service: Not on file    Active member of club or organization: Not on file    Attends meetings of clubs or organizations: Not on file    Relationship status: Not on file  . Intimate partner violence    Fear of current or ex partner: Not on file    Emotionally abused: Not on file    Physically abused: Not on file    Forced sexual activity: Not on file  Other Topics Concern  . Not on file  Social History Narrative  . Not on file   Family History  Problem Relation Age of Onset  . Autism Son   . Diabetes Maternal Grandmother   . Stroke Maternal Grandmother   . Heart failure Maternal Grandmother   . Stroke Maternal Grandfather   . Stroke Father   . Seizures Father   . Allergies Father        penicillin  . Other Mother        herpes  . Heart failure Paternal Grandfather   . Diabetes Paternal Grandmother   . Hypertension Paternal Grandmother   . Autism Brother   . Other Paternal Uncle        heart exploded  . Cancer Maternal Aunt   . Diabetes Maternal Aunt   . Hypertension Maternal Aunt   . Immunodeficiency Neg Hx   . Urticaria Neg Hx   . Eczema Neg Hx   . Atopy Neg Hx   . Asthma Neg Hx   . Angioedema Neg Hx   . Allergic rhinitis Neg Hx     OBJECTIVE:  Vitals:   06/29/19 1639  BP: 121/72  Pulse: 73  Resp: 19  Temp: 98.4 F (36.9 C)  TempSrc: Oral  SpO2: 95%     General appearance: alert; appears fatigued, but nontoxic; speaking in full sentences and tolerating own secretions HEENT: NCAT; Ears: EACs clear, TMs pearly gray; Eyes: PERRL.  EOM grossly intact. Nose: nares patent without rhinorrhea, Throat: oropharynx clear, tonsils non erythematous or enlarged, uvula midline  Neck: supple without LAD Lungs: unlabored respirations, symmetrical air entry; cough: absent; no respiratory distress; CTAB Heart: regular rate and rhythm.   Skin: warm and dry Psychological: alert and cooperative; normal  mood and affect  LABS:  Results for orders placed or performed during the hospital encounter of 06/29/19 (from the past 24 hour(s))  POC SARS Coronavirus 2 Ag-ED - Nasal Swab (BD Veritor Kit)     Status: None   Collection Time: 06/29/19  4:56 PM  Result Value Ref Range   SARS Coronavirus 2  Ag Negative Negative  POCT Influenza A/B     Status: None   Collection Time: 06/29/19  5:02 PM  Result Value Ref Range   Influenza A, POC Negative Negative   Influenza B, POC Negative Negative     ASSESSMENT & PLAN:  1. Suspected COVID-19 virus infection   2. Flu-like symptoms     Meds ordered this encounter  Medications  . benzonatate (TESSALON) 100 MG capsule    Sig: Take 1 capsule (100 mg total) by mouth every 8 (eight) hours.    Dispense:  21 capsule    Refill:  0    Order Specific Question:   Supervising Provider    Answer:   Raylene Everts [[id number]]   Rapid flu negative Rapid COVID negative.  Culture sent.  Patient should remain in quarantine until they have received culture results.  If negative you may resume normal activities (go back to work/school) while practicing hand hygiene, social distance, and mask wearing.  If positive, patient should remain in quarantine for 10 days from symptom onset AND greater than 72 hours after symptoms resolution (absence of fever without the use of fever-reducing medication and improvement in respiratory symptoms), whichever is longer  Get plenty of rest and push fluids. Encouraged her to supplement with pedialyte and/or ORS due to diarrhea Use OTC zyrtec for nasal congestion, runny nose, and/or sore throat Use OTC flonase for nasal congestion and runny nose Tessalon sent for cough.  Use OTC medications like ibuprofen or tylenol as needed fever or pain Call or go to the ED if you have any new or worsening symptoms such as fever, worsening cough, shortness of breath, chest tightness, chest pain, turning blue, changes in mental status, persistent  diarrhea, etc...    Reviewed expectations re: course of current medical issues. Questions answered. Outlined signs and symptoms indicating need for more acute intervention. Patient verbalized understanding. After Visit Summary given.         Lestine Box, PA-C 06/29/19 2016

## 2019-06-29 NOTE — Discharge Instructions (Signed)
Rapid flu negative Rapid COVID negative.  Culture sent.  Patient should remain in quarantine until they have received culture results.  If negative you may resume normal activities (go back to work/school) while practicing hand hygiene, social distance, and mask wearing.  If positive, patient should remain in quarantine for 10 days from symptom onset AND greater than 72 hours after symptoms resolution (absence of fever without the use of fever-reducing medication and improvement in respiratory symptoms), whichever is longer Get plenty of rest and push fluids Use OTC zyrtec for nasal congestion, runny nose, and/or sore throat Use OTC flonase for nasal congestion and runny nose Use medications daily for symptom relief Use OTC medications like ibuprofen or tylenol as needed fever or pain Call or go to the ED if you have any new or worsening symptoms such as fever, worsening cough, shortness of breath, chest tightness, chest pain, turning blue, changes in mental status, etc..[name]

## 2019-06-29 NOTE — Progress Notes (Signed)
Subjective:    Patient ID: Jaime Flores, female    DOB: 29-Jun-1986, 33 y.o.   MRN: [id number]   HPI: Jaime Flores is a 33 y.o. female presenting for sore throat incresing for 2-3 days. Hoarse. HArd to swallow. No fever, no cough, no dyspnea.   Depression screen [facility] 2/9 06/03/2019 09/15/2018 08/14/2018 05/06/2018 04/14/2018  Decreased Interest 0 0 0 0 0  Down, Depressed, Hopeless 0 0 0 0 0  PHQ - 2 Score 0 0 0 0 0  Altered sleeping - - - - -  Tired, decreased energy - - - - -  Change in appetite - - - - -  Feeling bad or failure about yourself  - - - - -  Trouble concentrating - - - - -  Moving slowly or fidgety/restless - - - - -  Suicidal thoughts - - - - -  PHQ-9 Score - - - - -     Relevant past medical, surgical, family and social history reviewed and updated as indicated.  Interim medical history since our last visit reviewed. Allergies and medications reviewed and updated.  ROS:  Review of Systems  Constitutional: Positive for appetite change. Negative for chills, diaphoresis, fatigue and fever.  HENT: Positive for congestion, postnasal drip, sore throat and trouble swallowing. Negative for hearing loss and rhinorrhea.   Respiratory: Negative for cough, chest tightness and shortness of breath.   Cardiovascular: Negative for chest pain and palpitations.  Gastrointestinal: Negative for abdominal pain.  Musculoskeletal: Negative for arthralgias.  Skin: Negative for rash.     Social History   Tobacco Use  Smoking Status Current Every Day Smoker  . Packs/day: 0.50  . Types: Cigarettes  Smokeless Tobacco Never Used       Objective:     Wt Readings from Last 3 Encounters:  06/03/19 146 lb (66.2 kg)  02/06/19 141 lb (64 kg)  10/22/18 134 lb (60.8 kg)     Exam deferred. Pt. Harboring due to COVID 19. Phone visit performed.   Assessment & Plan:   1. Strep pharyngitis     Meds ordered this encounter  Medications  . amoxicillin (AMOXIL) 875 MG  tablet    Sig: Take 1 tablet (875 mg total) by mouth 2 (two) times daily for 10 days.    Dispense:  20 tablet    Refill:  0    No orders of the defined types were placed in this encounter.     Diagnoses and all orders for this visit:  Strep pharyngitis  Other orders -     amoxicillin (AMOXIL) 875 MG tablet; Take 1 tablet (875 mg total) by mouth 2 (two) times daily for 10 days.    Virtual Visit via telephone Note  I discussed the limitations, risks, security and privacy concerns of performing an evaluation and management service by telephone and the availability of in person appointments. The patient was identified with two identifiers. Pt.expressed understanding and agreed to proceed. Pt. Is at home. Dr. Livia Snellen is in his office.  Follow Up Instructions:   I discussed the assessment and treatment plan with the patient. The patient was provided an opportunity to ask questions and all were answered. The patient agreed with the plan and demonstrated an understanding of the instructions.   The patient was advised to call back or seek an in-person evaluation if the symptoms worsen or if the condition fails to improve as anticipated.   Total minutes including chart review and phone  contact time: 13   Follow up plan: Return if symptoms worsen or fail to improve.  Claretta Fraise, MD [place]

## 2019-06-29 NOTE — ED Triage Notes (Signed)
Pt presents to UC w/ diarrhea, sore throat, fatigue, cough x1 day.

## 2019-06-30 LAB — NOVEL CORONAVIRUS, NAA: SARS-CoV-2, NAA: NOT DETECTED

## 2019-07-06 ENCOUNTER — Other Ambulatory Visit: Payer: Self-pay | Admitting: Family Medicine

## 2019-07-06 DIAGNOSIS — F419 Anxiety disorder, unspecified: Secondary | ICD-10-CM

## 2019-07-07 NOTE — Telephone Encounter (Signed)
[name]. NTBS 30 days given 06/02/19

## 2019-07-25 ENCOUNTER — Ambulatory Visit
Admission: EM | Admit: 2019-07-25 | Discharge: 2019-07-25 | Disposition: A | Discharge status: Home / Self Care | Payer: Medicaid Other | Attending: Emergency Medicine | Admitting: Emergency Medicine

## 2019-07-25 ENCOUNTER — Other Ambulatory Visit: Payer: Self-pay

## 2019-07-25 DIAGNOSIS — Z20822 Contact with and (suspected) exposure to covid-19: Secondary | ICD-10-CM | POA: Diagnosis not present

## 2019-07-25 DIAGNOSIS — Z20828 Contact with and (suspected) exposure to other viral communicable diseases: Secondary | ICD-10-CM

## 2019-07-25 DIAGNOSIS — J029 Acute pharyngitis, unspecified: Secondary | ICD-10-CM | POA: Diagnosis not present

## 2019-07-25 DIAGNOSIS — R05 Cough: Secondary | ICD-10-CM

## 2019-07-25 MED ORDER — BENZONATATE 100 MG PO CAPS: 100.0000 mg | ORAL_CAPSULE | Freq: Three times a day (TID) | ORAL | 0 refills | Status: DC

## 2019-07-25 NOTE — Discharge Instructions (Addendum)
COVID testing ordered.  It will take between 2-7 days for test results.  Someone will contact you regarding abnormal results.    In the meantime: You should remain isolated in your home for 10 days from symptom onset  Get plenty of rest and push fluids Tessalon Perles prescribed for cough Use medications daily for symptom relief Use OTC medications like ibuprofen or tylenol as needed fever or pain Call or go to the ED if you have any new or worsening symptoms such as fever, worsening cough, shortness of breath, chest tightness, chest pain, turning blue, changes in mental status, etc..[name]

## 2019-07-25 NOTE — ED Triage Notes (Signed)
Pt presents to UC w/ c/o sore throat x2-3 days, cough started today. Pt has had positive covid exposure.

## 2019-07-25 NOTE — ED Provider Notes (Signed)
[facility]    CSN: [id number] Arrival date & time: 07/25/19  1542      History   Chief Complaint Chief Complaint  Patient presents with  . covid test/sore throat    HPI Jaime Flores is a 34 y.o. female.   Jaime Flores presents to the urgent care for complaint of cough and sore throat for the past 2 to 3 days.  Patient reports positive Covid exposure..  Denies sick exposure to  flu or strep.  Denies recent travel.  Denies aggravating or alleviating symptoms.  Denies previous COVID infection.   Denies fever, chills, fatigue, nasal congestion, rhinorrhea, sore throat, cough, SOB, wheezing, chest pain, nausea, vomiting, changes in bowel or bladder habits.    The history is provided by the patient. No language interpreter was used.    Past Medical History:  Diagnosis Date  . Amenorrhea 01/27/2013  . Angio-edema   . Breast pain 03/10/2015  . Encounter for menstrual regulation 07/05/2015  . Irregular intermenstrual bleeding 07/28/2015  . Irregular periods 07/05/2015  . Screening for STD (sexually transmitted disease) 08/07/2013  . Urticaria   . UTI (urinary tract infection)   . Vaginal discharge 06/10/2013    Patient Active Problem List   Diagnosis Date Noted  . Encounter for gynecological examination with Papanicolaou smear of cervix 06/03/2019  . 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 Cough 05/15/2019  . Seasonal and perennial allergic rhinitis 05/15/2019  . Bug bite 02/06/2019  . Hemorrhoids 09/26/2016  . Irregular intermenstrual bleeding 07/28/2015  . Amenorrhea 01/27/2013    Past Surgical History:  Procedure Laterality Date  . APPENDECTOMY    . CHOLECYSTECTOMY    . TUBAL LIGATION      OB History    Gravida  2   Para  2   Term  1   Preterm  1   AB      Living  2     SAB      TAB      Ectopic      Multiple      Live Births               Home Medications    Prior to Admission medications    Medication Sig Start Date End Date Taking? Authorizing Provider  albuterol (VENTOLIN HFA) 108 (90 Base) MCG/ACT inhaler Use 4 puffs every 4-6 hours as needed for cough or wheeze. 05/15/19   Valentina Shaggy, MD  benzonatate (TESSALON) 100 MG capsule Take 1 capsule (100 mg total) by mouth every 8 (eight) hours. 07/25/19   Rufus Beske, Darrelyn Hillock, FNP  cetirizine (ZYRTEC) 10 MG tablet Take 1 tablet (10 mg total) by mouth daily. 05/25/19 06/24/19  Valentina Shaggy, MD  citalopram (CELEXA) 40 MG tablet Take 1 tablet (40 mg total) by mouth daily. 06/02/19   [name], Fransisca Kaufmann, MD  cyclobenzaprine (FLEXERIL) 10 MG tablet Take 1 tablet (10 mg total) by mouth 3 (three) times daily as needed for muscle spasms. 02/11/19   [name], Fransisca Kaufmann, MD  diphenhydrAMINE (BENADRYL) 25 MG tablet Take 25 mg by mouth every 6 (six) hours as needed.    [provider]  EPINEPHrine 0.3 mg/0.3 mL IJ SOAJ injection Inject 0.3 mLs (0.3 mg total) into the muscle as needed for anaphylaxis. 09/15/18   [name], Fransisca Kaufmann, MD  megestrol (MEGACE) 40 MG tablet Take 1 tablet (40 mg total) by mouth daily. Patient taking differently: Take 40 mg by mouth daily  as needed.  01/02/18   Estill Dooms, NP  promethazine (PHENERGAN) 25 MG tablet Take 1 tablet (25 mg total) by mouth every 8 (eight) hours as needed for nausea or vomiting. 05/06/19   Terald Sleeper, PA-C  traZODone (DESYREL) 50 MG tablet Take 0.5-1 tablets (25-50 mg total) by mouth at bedtime as needed for sleep. 09/15/18   [name], Fransisca Kaufmann, MD    Family History Family History  Problem Relation Age of Onset  . Autism Son   . Diabetes Maternal Grandmother   . Stroke Maternal Grandmother   . Heart failure Maternal Grandmother   . Stroke Maternal Grandfather   . Stroke Father   . Seizures Father   . Allergies Father        penicillin  . Other Mother        herpes  . Heart failure Paternal Grandfather   . Diabetes Paternal Grandmother   . Hypertension  Paternal Grandmother   . Autism Brother   . Other Paternal Uncle        heart exploded  . Cancer Maternal Aunt   . Diabetes Maternal Aunt   . Hypertension Maternal Aunt   . Immunodeficiency Neg Hx   . Urticaria Neg Hx   . Eczema Neg Hx   . Atopy Neg Hx   . Asthma Neg Hx   . Angioedema Neg Hx   . Allergic rhinitis Neg Hx     Social History Social History   Tobacco Use  . Smoking status: Current Every Day Smoker    Packs/day: 0.50    Types: Cigarettes  . Smokeless tobacco: Never Used  Substance Use Topics  . Alcohol use: No  . Drug use: No     Allergies   Doxycycline, Wellbutrin [bupropion], and Tape   Review of Systems Review of Systems  Constitutional: Negative.   HENT: Positive for sore throat.   Respiratory: Positive for cough.   Cardiovascular: Negative.   Gastrointestinal: Negative.   Neurological: Negative.      Physical Exam Triage Vital Signs ED Triage Vitals  Enc Vitals Group     BP 07/25/19 1625 109/62     Pulse Rate 07/25/19 1625 80     Resp 07/25/19 1625 16     Temp 07/25/19 1625 98.6 F (37 C)     Temp Source 07/25/19 1625 Oral     SpO2 07/25/19 1625 97 %     Weight --      Height --      Head Circumference --      Peak Flow --      Pain Score 07/25/19 1633 0     Pain Loc --      Pain Edu? --      Excl. in [place]? --    No data found.  Updated Vital Signs BP 109/62 (BP Location: Right Arm)   Pulse 80   Temp 98.6 F (37 C) (Oral)   Resp 16   SpO2 97%   Visual Acuity Right Eye Distance:   Left Eye Distance:   Bilateral Distance:    Right Eye Near:   Left Eye Near:    Bilateral Near:     Physical Exam Vitals and nursing note reviewed.  Constitutional:      General: She is not in acute distress.    Appearance: Normal appearance. She is normal weight. She is not ill-appearing or toxic-appearing.  HENT:     Head: Normocephalic.     Right Ear: Tympanic  membrane, ear canal and external ear normal. There is no impacted cerumen.      Left Ear: Tympanic membrane, ear canal and external ear normal. There is no impacted cerumen.     Nose: Nose normal. No congestion.     Mouth/Throat:     Mouth: Mucous membranes are moist.     Pharynx: No oropharyngeal exudate or posterior oropharyngeal erythema.  Cardiovascular:     Rate and Rhythm: Normal rate and regular rhythm.     Pulses: Normal pulses.     Heart sounds: Normal heart sounds. No murmur.  Pulmonary:     Effort: Pulmonary effort is normal. No respiratory distress.     Breath sounds: No wheezing or rhonchi.  Chest:     Chest wall: No tenderness.  Abdominal:     General: Abdomen is flat. Bowel sounds are normal. There is no distension.     Palpations: There is no mass.  Skin:    Capillary Refill: Capillary refill takes less than 2 seconds.  Neurological:     Mental Status: She is alert and oriented to person, place, and time.      UC Treatments / Results  Labs (all labs ordered are listed, but only abnormal results are displayed) Labs Reviewed  NOVEL CORONAVIRUS, NAA    EKG   Radiology No results found.  Procedures Procedures (including critical care time)  Medications Ordered in UC Medications - No data to display  Initial Impression / Assessment and Plan / UC Course  I have reviewed the triage vital signs and the nursing notes.  Pertinent labs & imaging results that were available during my care of the patient were reviewed by me and considered in my medical decision making (see chart for details).   COVID-19 test was ordered.  Patient is stable for discharge.  Advised patient to quarantine until COVID-19 test result become available.  To go to ED for worsening of symptoms.  Patient verbalized understanding plan of care.  Final Clinical Impressions(s) / UC Diagnoses   Final diagnoses:  Close exposure to COVID-19 virus     Discharge Instructions     COVID testing ordered.  It will take between 2-7 days for test results.  Someone will  contact you regarding abnormal results.    In the meantime: You should remain isolated in your home for 10 days from symptom onset  Get plenty of rest and push fluids Tessalon Perles prescribed for cough Use medications daily for symptom relief Use OTC medications like ibuprofen or tylenol as needed fever or pain Call or go to the ED if you have any new or worsening symptoms such as fever, worsening cough, shortness of breath, chest tightness, chest pain, turning blue, changes in mental status, etc...     ED Prescriptions    Medication Sig Dispense Auth. Provider   benzonatate (TESSALON) 100 MG capsule Take 1 capsule (100 mg total) by mouth every 8 (eight) hours. 21 capsule Marvell Stavola, Darrelyn Hillock, FNP     PDMP not reviewed this encounter.   Emerson Monte, FNP 07/25/19 1714

## 2019-07-26 ENCOUNTER — Encounter: Payer: Self-pay | Admitting: Family Medicine

## 2019-07-27 ENCOUNTER — Ambulatory Visit (INDEPENDENT_AMBULATORY_CARE_PROVIDER_SITE_OTHER): Payer: Medicaid Other | Admitting: Family Medicine

## 2019-07-27 ENCOUNTER — Encounter: Payer: Self-pay | Admitting: Family Medicine

## 2019-07-27 DIAGNOSIS — J0100 Acute maxillary sinusitis, unspecified: Secondary | ICD-10-CM

## 2019-07-27 LAB — NOVEL CORONAVIRUS, NAA: SARS-CoV-2, NAA: NOT DETECTED

## 2019-07-27 MED ORDER — AMOXICILLIN-POT CLAVULANATE 875-125 MG PO TABS: 1.0000 | ORAL_TABLET | Freq: Two times a day (BID) | ORAL | 0 refills | Status: DC

## 2019-07-27 NOTE — Progress Notes (Signed)
Subjective:    Patient ID: Jaime Flores, female    DOB: 08-24-1985, 34 y.o.   MRN: [id number]   HPI: Jaime Flores is a 34 y.o. female presenting for Symptoms include congestion, facial pain, nasal congestion, non productive cough, post nasal drip and sinus pressure. There is no fever, chills, or sweats. Onset of symptoms was a few days ago, gradually worsening since that time.     Depression screen [facility] 2/9 06/03/2019 09/15/2018 08/14/2018 05/06/2018 04/14/2018  Decreased Interest 0 0 0 0 0  Down, Depressed, Hopeless 0 0 0 0 0  PHQ - 2 Score 0 0 0 0 0  Altered sleeping - - - - -  Tired, decreased energy - - - - -  Change in appetite - - - - -  Feeling bad or failure about yourself  - - - - -  Trouble concentrating - - - - -  Moving slowly or fidgety/restless - - - - -  Suicidal thoughts - - - - -  PHQ-9 Score - - - - -     Relevant past medical, surgical, family and social history reviewed and updated as indicated.  Interim medical history since our last visit reviewed. Allergies and medications reviewed and updated.  ROS:  Review of Systems  Constitutional: Negative for activity change, appetite change, chills and fever.  HENT: Positive for congestion, postnasal drip, rhinorrhea and sinus pressure. Negative for ear discharge, ear pain, hearing loss, nosebleeds, sneezing and trouble swallowing.   Respiratory: Negative for chest tightness and shortness of breath.   Cardiovascular: Negative for chest pain and palpitations.  Skin: Negative for rash.     Social History   Tobacco Use  Smoking Status Current Every Day Smoker  . Packs/day: 0.50  . Types: Cigarettes  Smokeless Tobacco Never Used       Objective:     Wt Readings from Last 3 Encounters:  06/03/19 146 lb (66.2 kg)  02/06/19 141 lb (64 kg)  10/22/18 134 lb (60.8 kg)     Exam deferred. Pt. Harboring due to COVID 19. Phone visit performed.   Assessment & Plan:   1. Acute maxillary sinusitis,  recurrence not specified     Meds ordered this encounter  Medications  . amoxicillin-clavulanate (AUGMENTIN) 875-125 MG tablet    Sig: Take 1 tablet by mouth 2 (two) times daily.    Dispense:  20 tablet    Refill:  0    No orders of the defined types were placed in this encounter.     Diagnoses and all orders for this visit:  Acute maxillary sinusitis, recurrence not specified  Other orders -     amoxicillin-clavulanate (AUGMENTIN) 875-125 MG tablet; Take 1 tablet by mouth 2 (two) times daily.    Virtual Visit via telephone Note  I discussed the limitations, risks, security and privacy concerns of performing an evaluation and management service by telephone and the availability of in person appointments. The patient was identified with two identifiers. Pt.expressed understanding and agreed to proceed. Pt. Is at home. Dr. Livia Snellen is in his office.  Follow Up Instructions:   I discussed the assessment and treatment plan with the patient. The patient was provided an opportunity to ask questions and all were answered. The patient agreed with the plan and demonstrated an understanding of the instructions.   The patient was advised to call back or seek an in-person evaluation if the symptoms worsen or if the condition fails to improve as  anticipated.   Total minutes including chart review and phone contact time: 12   Follow up plan: Return if symptoms worsen or fail to improve.  Claretta Fraise, MD [place]

## 2019-07-29 ENCOUNTER — Emergency Department (HOSPITAL_COMMUNITY)
Admission: EM | Admit: 2019-07-29 | Discharge: 2019-07-29 | Disposition: A | Discharge status: Home / Self Care | Payer: Medicaid Other | Attending: Emergency Medicine | Admitting: Emergency Medicine

## 2019-07-29 ENCOUNTER — Encounter (HOSPITAL_COMMUNITY): Payer: Self-pay | Admitting: Emergency Medicine

## 2019-07-29 ENCOUNTER — Emergency Department (HOSPITAL_COMMUNITY): Payer: Medicaid Other

## 2019-07-29 ENCOUNTER — Other Ambulatory Visit: Payer: Self-pay

## 2019-07-29 DIAGNOSIS — U071 COVID-19: Secondary | ICD-10-CM | POA: Diagnosis not present

## 2019-07-29 DIAGNOSIS — F17210 Nicotine dependence, cigarettes, uncomplicated: Secondary | ICD-10-CM | POA: Diagnosis not present

## 2019-07-29 DIAGNOSIS — Z79899 Other long term (current) drug therapy: Secondary | ICD-10-CM | POA: Insufficient documentation

## 2019-07-29 DIAGNOSIS — J069 Acute upper respiratory infection, unspecified: Secondary | ICD-10-CM

## 2019-07-29 DIAGNOSIS — R05 Cough: Secondary | ICD-10-CM | POA: Diagnosis not present

## 2019-07-29 LAB — GROUP A STREP BY PCR: Group A Strep by PCR: NOT DETECTED

## 2019-07-29 LAB — SARS CORONAVIRUS 2 (TAT 6-24 HRS): SARS Coronavirus 2: POSITIVE — AB

## 2019-07-29 MED ORDER — AZITHROMYCIN 250 MG PO TABS: ORAL_TABLET | ORAL | 0 refills | Status: DC

## 2019-07-29 NOTE — ED Triage Notes (Addendum)
Pt arrives to ED w/complaints of covid symptoms; sore throat, cough, fatigue, headache that started @ 0000 1/5. Pt states symptoms worse since seen @ UC. Pt also complains of increased sob & chest pain.

## 2019-07-29 NOTE — Discharge Instructions (Addendum)
Isolate at home until the results of your covid test are known.  This should be in the next 24 hours.    Tylenol 1000 mg every six hours as needed for pain or fever.  Fill the prescription for Zithromax if your covid test is negative and you are not improving in the next few days.

## 2019-07-29 NOTE — ED Provider Notes (Signed)
[facility] Provider Note   CSN: [id number] Arrival date & time: 07/29/19  0250     History Chief Complaint  Patient presents with  . Cough    Jaime Flores is a 34 y.o. female.  Patient is a 34 year old female with past medical history of UTI.  She presents today for evaluation of chest congestion, cough, sore throat, body aches, not feeling well.  This is been ongoing for the past week.  She was seen in urgent care several days ago and had a negative Covid test.  She was told if she did not feel better, she should come to the emergency department.  She denies to me she is having any difficulty breathing.  She does state that she has had close contact with Covid.  The history is provided by the patient.  Cough Cough characteristics:  Non-productive Severity:  Moderate Onset quality:  Gradual Duration:  1 week Timing:  Constant Progression:  Worsening Chronicity:  New Relieved by:  Nothing Worsened by:  Nothing      Past Medical History:  Diagnosis Date  . Amenorrhea 01/27/2013  . Angio-edema   . Breast pain 03/10/2015  . Encounter for menstrual regulation 07/05/2015  . Irregular intermenstrual bleeding 07/28/2015  . Irregular periods 07/05/2015  . Screening for STD (sexually transmitted disease) 08/07/2013  . Urticaria   . UTI (urinary tract infection)   . Vaginal discharge 06/10/2013    Patient Active Problem List   Diagnosis Date Noted  . Encounter for gynecological examination with Papanicolaou smear of cervix 06/03/2019  . 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 Cough 05/15/2019  . Seasonal and perennial allergic rhinitis 05/15/2019  . Bug bite 02/06/2019  . Hemorrhoids 09/26/2016  . Irregular intermenstrual bleeding 07/28/2015  . Amenorrhea 01/27/2013    Past Surgical History:  Procedure Laterality Date  . APPENDECTOMY    . CHOLECYSTECTOMY    . TUBAL LIGATION       OB History    Gravida  2   Para  2   Term  1   Preterm  1   AB      Living  2     SAB      TAB      Ectopic      Multiple      Live Births              Family History  Problem Relation Age of Onset  . Autism Son   . Diabetes Maternal Grandmother   . Stroke Maternal Grandmother   . Heart failure Maternal Grandmother   . Stroke Maternal Grandfather   . Stroke Father   . Seizures Father   . Allergies Father        penicillin  . Other Mother        herpes  . Heart failure Paternal Grandfather   . Diabetes Paternal Grandmother   . Hypertension Paternal Grandmother   . Autism Brother   . Other Paternal Uncle        heart exploded  . Cancer Maternal Aunt   . Diabetes Maternal Aunt   . Hypertension Maternal Aunt   . Immunodeficiency Neg Hx   . Urticaria Neg Hx   . Eczema Neg Hx   . Atopy Neg Hx   . Asthma Neg Hx   . Angioedema Neg Hx   . Allergic rhinitis Neg Hx     Social History   Tobacco Use  . Smoking  status: Current Every Day Smoker    Packs/day: 0.50    Types: Cigarettes  . Smokeless tobacco: Never Used  Substance Use Topics  . Alcohol use: No  . Drug use: No    Home Medications Prior to Admission medications   Medication Sig Start Date End Date Taking? Authorizing Provider  albuterol (VENTOLIN HFA) 108 (90 Base) MCG/ACT inhaler Use 4 puffs every 4-6 hours as needed for cough or wheeze. 05/15/19   Valentina Shaggy, MD  amoxicillin-clavulanate (AUGMENTIN) 875-125 MG tablet Take 1 tablet by mouth 2 (two) times daily. 07/27/19   Claretta Fraise, MD  benzonatate (TESSALON) 100 MG capsule Take 1 capsule (100 mg total) by mouth every 8 (eight) hours. 07/25/19   Avegno, Darrelyn Hillock, FNP  cetirizine (ZYRTEC) 10 MG tablet Take 1 tablet (10 mg total) by mouth daily. 05/25/19 06/24/19  Valentina Shaggy, MD  citalopram (CELEXA) 40 MG tablet Take 1 tablet (40 mg total) by mouth daily. 06/02/19   [name], Fransisca Kaufmann, MD  cyclobenzaprine (FLEXERIL) 10 MG tablet Take 1 tablet (10 mg total) by  mouth 3 (three) times daily as needed for muscle spasms. 02/11/19   [name], Fransisca Kaufmann, MD  diphenhydrAMINE (BENADRYL) 25 MG tablet Take 25 mg by mouth every 6 (six) hours as needed.    [provider]  EPINEPHrine 0.3 mg/0.3 mL IJ SOAJ injection Inject 0.3 mLs (0.3 mg total) into the muscle as needed for anaphylaxis. 09/15/18   [name], Fransisca Kaufmann, MD  megestrol (MEGACE) 40 MG tablet Take 1 tablet (40 mg total) by mouth daily. Patient taking differently: Take 40 mg by mouth daily as needed.  01/02/18   Estill Dooms, NP  promethazine (PHENERGAN) 25 MG tablet Take 1 tablet (25 mg total) by mouth every 8 (eight) hours as needed for nausea or vomiting. 05/06/19   Terald Sleeper, PA-C  traZODone (DESYREL) 50 MG tablet Take 0.5-1 tablets (25-50 mg total) by mouth at bedtime as needed for sleep. 09/15/18   [name], Fransisca Kaufmann, MD    Allergies    Doxycycline, Wellbutrin [bupropion], and Tape  Review of Systems   Review of Systems  Respiratory: Positive for cough.   All other systems reviewed and are negative.   Physical Exam Updated Vital Signs BP 119/72 (BP Location: Right Arm)   Pulse 72   Temp 98.2 F (36.8 C) (Oral)   Resp 15   Ht 5\' 2"  (1.575 m)   Wt 68 kg   LMP 06/21/2019   SpO2 97%   BMI 27.44 kg/m   Physical Exam Vitals and nursing note reviewed.  Constitutional:      General: She is not in acute distress.    Appearance: She is well-developed. She is not diaphoretic.  HENT:     Head: Normocephalic and atraumatic.     Mouth/Throat:     Mouth: Mucous membranes are moist.     Pharynx: No oropharyngeal exudate or posterior oropharyngeal erythema.  Cardiovascular:     Rate and Rhythm: Normal rate and regular rhythm.     Heart sounds: No murmur. No friction rub. No gallop.   Pulmonary:     Effort: Pulmonary effort is normal. No respiratory distress.     Breath sounds: Normal breath sounds. No wheezing.  Abdominal:     General: Bowel sounds are normal. There is  no distension.     Palpations: Abdomen is soft.     Tenderness: There is no abdominal tenderness.  Musculoskeletal:  General: Normal range of motion.     Cervical back: Normal range of motion and neck supple.  Skin:    General: Skin is warm and dry.  Neurological:     Mental Status: She is alert and oriented to person, place, and time.     ED Results / Procedures / Treatments   Labs (all labs ordered are listed, but only abnormal results are displayed) Labs Reviewed  SARS CORONAVIRUS 2 (TAT 6-24 HRS)  GROUP A STREP BY PCR    EKG None  Radiology No results found.  Procedures Procedures (including critical care time)  Medications Ordered in ED Medications - No data to display  ED Course  I have reviewed the triage vital signs and the nursing notes.  Pertinent labs & imaging results that were available during my care of the patient were reviewed by me and considered in my medical decision making (see chart for details).    MDM Rules/Calculators/A&P  URI symptoms for one week.  Possibly Covid, but did have a negative test several days ago.  Will retest.  Home with otc meds, z-max if not improving, prn return.    Jaime Flores was evaluated in Emergency Department on 07/29/2019 for the symptoms described in the history of present illness. She was evaluated in the context of the global COVID-19 pandemic, which necessitated consideration that the patient might be at risk for infection with the SARS-CoV-2 virus that causes COVID-19. Institutional protocols and algorithms that pertain to the evaluation of patients at risk for COVID-19 are in a state of rapid change based on information released by regulatory bodies including the CDC and federal and state organizations. These policies and algorithms were followed during the patient's care in the ED.   Final Clinical Impression(s) / ED Diagnoses Final diagnoses:  None    Rx / DC Orders ED Discharge Orders    None        Veryl Speak, MD 07/29/19 [phone]

## 2019-07-31 ENCOUNTER — Ambulatory Visit: Payer: Self-pay

## 2019-08-02 ENCOUNTER — Encounter: Payer: Self-pay | Admitting: Family Medicine

## 2019-08-04 ENCOUNTER — Telehealth: Payer: Self-pay | Admitting: Family Medicine

## 2019-08-14 ENCOUNTER — Encounter: Payer: Self-pay | Admitting: Family Medicine

## 2019-08-14 ENCOUNTER — Ambulatory Visit (INDEPENDENT_AMBULATORY_CARE_PROVIDER_SITE_OTHER): Payer: Medicaid Other | Admitting: Family Medicine

## 2019-08-14 DIAGNOSIS — F419 Anxiety disorder, unspecified: Secondary | ICD-10-CM | POA: Diagnosis not present

## 2019-08-14 MED ORDER — CITALOPRAM HYDROBROMIDE 40 MG PO TABS: 40.0000 mg | ORAL_TABLET | Freq: Every day | ORAL | 1 refills | Status: DC

## 2019-08-14 MED ORDER — FAMOTIDINE 20 MG PO TABS: 20.0000 mg | ORAL_TABLET | Freq: Two times a day (BID) | ORAL | 1 refills | Status: DC

## 2019-08-14 NOTE — Progress Notes (Signed)
Virtual Visit via telephone Note  I connected with Jaime Flores on [id number] at 0911 by telephone and verified that I am speaking with the correct person using two identifiers. Jaime Flores is currently located at home and no other people are currently with her during visit. The provider, Fransisca Kaufmann Kodah Maret, MD is located in their office at time of visit.  Call ended at [phone]  I discussed the limitations, risks, security and privacy concerns of performing an evaluation and management service by telephone and the availability of in person appointments. I also discussed with the patient that there may be a patient responsible charge related to this service. The patient expressed understanding and agreed to proceed.   History and Present Illness: Patient is calling in for anxiety recheck and is taking citalopram 40 mg and she is doing a lot better with the higher dose.  She feels a lot better on the medication.  She stopped it after covid and she is still fatigued. She denies any suicidal ideations. She feels like she wants to go back on it.   No diagnosis found.  Outpatient Encounter Medications as of 08/14/2019  Medication Sig  . albuterol (VENTOLIN HFA) 108 (90 Base) MCG/ACT inhaler Use 4 puffs every 4-6 hours as needed for cough or wheeze.  [name] amoxicillin-clavulanate (AUGMENTIN) 875-125 MG tablet Take 1 tablet by mouth 2 (two) times daily.  [name] azithromycin (ZITHROMAX Z-PAK) 250 MG tablet 2 po day one, then 1 daily x 4 days  . benzonatate (TESSALON) 100 MG capsule Take 1 capsule (100 mg total) by mouth every 8 (eight) hours.  . cetirizine (ZYRTEC) 10 MG tablet Take 1 tablet (10 mg total) by mouth daily.  . citalopram (CELEXA) 40 MG tablet Take 1 tablet (40 mg total) by mouth daily.  . cyclobenzaprine (FLEXERIL) 10 MG tablet Take 1 tablet (10 mg total) by mouth 3 (three) times daily as needed for muscle spasms.  . diphenhydrAMINE (BENADRYL) 25 MG tablet Take 25 mg by mouth every 6  (six) hours as needed.  [name] EPINEPHrine 0.3 mg/0.3 mL IJ SOAJ injection Inject 0.3 mLs (0.3 mg total) into the muscle as needed for anaphylaxis.  . megestrol (MEGACE) 40 MG tablet Take 1 tablet (40 mg total) by mouth daily. (Patient taking differently: Take 40 mg by mouth daily as needed. )  . promethazine (PHENERGAN) 25 MG tablet Take 1 tablet (25 mg total) by mouth every 8 (eight) hours as needed for nausea or vomiting.  . traZODone (DESYREL) 50 MG tablet Take 0.5-1 tablets (25-50 mg total) by mouth at bedtime as needed for sleep.   No facility-administered encounter medications on file as of 08/14/2019.    Review of Systems  Constitutional: Negative for chills and fever.  Eyes: Negative for visual disturbance.  Respiratory: Negative for chest tightness and shortness of breath.   Cardiovascular: Negative for chest pain and leg swelling.  Musculoskeletal: Negative for back pain and gait problem.  Skin: Negative for rash.  Neurological: Negative for light-headedness and headaches.  Psychiatric/Behavioral: Negative for agitation and behavioral problems.  All other systems reviewed and are negative.   Observations/Objective: Patient sounds comfortable and in no acute distress  Assessment and Plan: Problem List Items Addressed This Visit    None    Visit Diagnoses    Anxiety    -  Primary   Relevant Medications   citalopram (CELEXA) 40 MG tablet       restart celexa with 20 mg daily for 7  days and then increase to 40 mg again. Follow up plan: Return in about 8 weeks (around 10/09/2019), or if symptoms worsen or fail to improve, for anxiety.     I discussed the assessment and treatment plan with the patient. The patient was provided an opportunity to ask questions and all were answered. The patient agreed with the plan and demonstrated an understanding of the instructions.   The patient was advised to call back or seek an in-person evaluation if the symptoms worsen or if the  condition fails to improve as anticipated.  The above assessment and management plan was discussed with the patient. The patient verbalized understanding of and has agreed to the management plan. Patient is aware to call the clinic if symptoms persist or worsen. Patient is aware when to return to the clinic for a follow-up visit. Patient educated on when it is appropriate to go to the emergency department.    I provided 18 minutes of non-face-to-face time during this encounter.    Jaime Rancher, MD

## 2019-09-16 ENCOUNTER — Other Ambulatory Visit: Payer: Self-pay | Admitting: Family Medicine

## 2019-09-16 DIAGNOSIS — F5101 Primary insomnia: Secondary | ICD-10-CM

## 2019-09-22 ENCOUNTER — Other Ambulatory Visit: Payer: Self-pay | Admitting: Family Medicine

## 2019-09-22 DIAGNOSIS — F5101 Primary insomnia: Secondary | ICD-10-CM

## 2019-09-22 MED ORDER — TRAZODONE HCL 50 MG PO TABS: 25.0000 mg | ORAL_TABLET | Freq: Every evening | ORAL | 0 refills | Status: DC | PRN

## 2019-09-24 ENCOUNTER — Ambulatory Visit
Admission: EM | Admit: 2019-09-24 | Discharge: 2019-09-24 | Disposition: A | Discharge status: Home / Self Care | Payer: Medicaid Other | Attending: Emergency Medicine | Admitting: Emergency Medicine

## 2019-09-24 ENCOUNTER — Other Ambulatory Visit: Payer: Self-pay

## 2019-09-24 DIAGNOSIS — G43009 Migraine without aura, not intractable, without status migrainosus: Secondary | ICD-10-CM | POA: Diagnosis not present

## 2019-09-24 MED ORDER — IBUPROFEN 800 MG PO TABS: 800.0000 mg | ORAL_TABLET | Freq: Three times a day (TID) | ORAL | 0 refills | Status: DC

## 2019-09-24 MED ORDER — METHYLPREDNISOLONE SODIUM SUCC 125 MG IJ SOLR
60.0000 mg | Freq: Once | INTRAMUSCULAR | Status: AC
  Administered 2019-09-24: 60 mg via INTRAMUSCULAR

## 2019-09-24 MED ORDER — SUMATRIPTAN SUCCINATE 6 MG/0.5ML ~~LOC~~ SOLN
6.0000 mg | Freq: Once | SUBCUTANEOUS | Status: AC
  Administered 2019-09-24: 6 mg via SUBCUTANEOUS

## 2019-09-24 NOTE — ED Provider Notes (Addendum)
[facility]   [id number] 09/24/19 Arrival Time: 1248  [id number]  SUBJECTIVE:  Jaime Flores is a 34 y.o. female who complains of migraine headache for the past 2 days.  Denies a precipitating event, or recent head trauma.   Describes the pain as constant and throbbing in character.  Patient has tried OTC  ibuprofen with mild relief. Symptoms are made worse with light.  Reports similar symptoms in the past that improved with ibuprofen.  This is not the worst headache of her life.  Patient denies fever, chills, nausea, vomiting, aura, rhinorrhea, watery eyes, chest pain, SOB, abdominal pain, weakness, numbness or tingling, slurred speech.     ROS: As per HPI.  All other pertinent ROS negative.     Past Medical History:  Diagnosis Date  . Amenorrhea 01/27/2013  . Angio-edema   . Breast pain 03/10/2015  . Encounter for menstrual regulation 07/05/2015  . Irregular intermenstrual bleeding 07/28/2015  . Irregular periods 07/05/2015  . Screening for STD (sexually transmitted disease) 08/07/2013  . Urticaria   . UTI (urinary tract infection)   . Vaginal discharge 06/10/2013   Past Surgical History:  Procedure Laterality Date  . APPENDECTOMY    . CHOLECYSTECTOMY    . TUBAL LIGATION     Allergies  Allergen Reactions  . Doxycycline Shortness Of Breath  . Wellbutrin [Bupropion] Shortness Of Breath, Nausea Only and Palpitations  . Tape Swelling    Plastic tape please use paper   No current facility-administered medications on file prior to encounter.   Current Outpatient Medications on File Prior to Encounter  Medication Sig Dispense Refill  . albuterol (VENTOLIN HFA) 108 (90 Base) MCG/ACT inhaler Use 4 puffs every 4-6 hours as needed for cough or wheeze. 18 g 2  . cetirizine (ZYRTEC) 10 MG tablet Take 1 tablet (10 mg total) by mouth daily. 30 tablet 5  . citalopram (CELEXA) 40 MG tablet Take 1 tablet (40 mg total) by mouth daily. 90 tablet 1  . cyclobenzaprine (FLEXERIL)  10 MG tablet Take 1 tablet (10 mg total) by mouth 3 (three) times daily as needed for muscle spasms. 30 tablet 0  . diphenhydrAMINE (BENADRYL) 25 MG tablet Take 25 mg by mouth every 6 (six) hours as needed.    [name] EPINEPHrine 0.3 mg/0.3 mL IJ SOAJ injection Inject 0.3 mLs (0.3 mg total) into the muscle as needed for anaphylaxis. 1 Device 1  . famotidine (PEPCID) 20 MG tablet Take 1 tablet (20 mg total) by mouth 2 (two) times daily. 60 tablet 1  . megestrol (MEGACE) 40 MG tablet Take 1 tablet (40 mg total) by mouth daily. (Patient taking differently: Take 40 mg by mouth daily as needed. ) 30 tablet 1  . promethazine (PHENERGAN) 25 MG tablet Take 1 tablet (25 mg total) by mouth every 8 (eight) hours as needed for nausea or vomiting. 30 tablet 0  . traZODone (DESYREL) 50 MG tablet Take 0.5-1 tablets (25-50 mg total) by mouth at bedtime as needed for sleep. 30 tablet 0   Social History   Socioeconomic History  . Marital status: Single    Spouse name: Not on file  . Number of children: 2  . Years of education: Not on file  . Highest education level: Not on file  Occupational History  . Not on file  Tobacco Use  . Smoking status: Current Every Day Smoker    Packs/day: 0.50    Types: Cigarettes  . Smokeless tobacco: Never Used  Substance and  Sexual Activity  . Alcohol use: No  . Drug use: No  . Sexual activity: Yes    Birth control/protection: Surgical    Comment: tubal  Other Topics Concern  . Not on file  Social History Narrative  . Not on file   Social Determinants of Health   Financial Resource Strain:   . Difficulty of Paying Living Expenses: Not on file  Food Insecurity:   . Worried About Charity fundraiser in the Last Year: Not on file  . Ran Out of Food in the Last Year: Not on file  Transportation Needs:   . Lack of Transportation (Medical): Not on file  . Lack of Transportation (Non-Medical): Not on file  Physical Activity:   . Days of Exercise per Week: Not on file  .  Minutes of Exercise per Session: Not on file  Stress:   . Feeling of Stress : Not on file  Social Connections:   . Frequency of Communication with Friends and Family: Not on file  . Frequency of Social Gatherings with Friends and Family: Not on file  . Attends Religious Services: Not on file  . Active Member of Clubs or Organizations: Not on file  . Attends Archivist Meetings: Not on file  . Marital Status: Not on file  Intimate Partner Violence:   . Fear of Current or Ex-Partner: Not on file  . Emotionally Abused: Not on file  . Physically Abused: Not on file  . Sexually Abused: Not on file   Family History  Problem Relation Age of Onset  . Autism Son   . Diabetes Maternal Grandmother   . Stroke Maternal Grandmother   . Heart failure Maternal Grandmother   . Stroke Maternal Grandfather   . Stroke Father   . Seizures Father   . Allergies Father        penicillin  . Other Mother        herpes  . Heart failure Paternal Grandfather   . Diabetes Paternal Grandmother   . Hypertension Paternal Grandmother   . Autism Brother   . Other Paternal Uncle        heart exploded  . Cancer Maternal Aunt   . Diabetes Maternal Aunt   . Hypertension Maternal Aunt   . Immunodeficiency Neg Hx   . Urticaria Neg Hx   . Eczema Neg Hx   . Atopy Neg Hx   . Asthma Neg Hx   . Angioedema Neg Hx   . Allergic rhinitis Neg Hx     OBJECTIVE:  Vitals:   09/24/19 1256  BP: 109/68  Pulse: 72  Resp: 16  Temp: 98.5 F (36.9 C)  TempSrc: Oral  SpO2: 96%    General appearance: alert; no distress Eyes: PERRLA; EOMI HENT: normocephalic; atraumatic Neck: supple with FROM Lungs: clear to auscultation bilaterally Heart: regular rate and rhythm.  Radial pulses 2+ symmetrical bilaterally Extremities: no edema; symmetrical with no gross deformities Skin: warm and dry Neurologic: CN 2-12 grossly intact; finger to nose without difficulty; normal gait; strength and sensation intact  bilaterally about the upper and lower extremities; negative pronator drift Psychological: alert and cooperative; normal mood and affect   ASSESSMENT & PLAN:  1. Migraine without aura and without status migrainosus, not intractable     Meds ordered this encounter  Medications  . methylPREDNISolone sodium succinate (SOLU-MEDROL) 125 mg/2 mL injection 60 mg  . SUMAtriptan (IMITREX) injection 6 mg  . ibuprofen (ADVIL) 800 MG tablet  Sig: Take 1 tablet (800 mg total) by mouth 3 (three) times daily. Take with food    Dispense:  60 tablet    Refill:  0   Patient is stable at discharge.  Imitrex IM and prednisone IM were given in office Ibuprofen 800 mg was prescribed Rest and drink plenty of fluids Use OTC medications as needed for symptomatic relief Follow up with PCP if symptoms persists Return or go to the ER if you have any new or worsening symptoms such as fever, chills, nausea, vomiting, chest pain, shortness of breath, cough, vision changes, worsening headache despite treatment, slurred speech, facial asymmetry, weakness in arms or legs, etc...  Reviewed expectations re: course of current medical issues. Questions answered. Outlined signs and symptoms indicating need for more acute intervention. Patient verbalized understanding. After Visit Summary given.    Emerson Monte, [place], FNP-C     Emerson Monte, FNP 09/24/19 1331

## 2019-09-24 NOTE — Discharge Instructions (Addendum)
Imitrex IM and prednisone IM were given in office Ibuprofen 800 mg was prescribed Rest and drink plenty of fluids Use OTC medications as needed for symptomatic relief Follow up with PCP if symptoms persists Return or go to the ER if you have any new or worsening symptoms such as fever, chills, nausea, vomiting, chest pain, shortness of breath, cough, vision changes, worsening headache despite treatment, slurred speech, facial asymmetry, weakness in arms or legs, etc.

## 2019-09-24 NOTE — ED Triage Notes (Signed)
Pt presents to UC w/ c/o migraine headache x2 days. Denies relief w/ ibuprofen.

## 2019-10-08 ENCOUNTER — Encounter: Payer: Self-pay | Admitting: Nurse Practitioner

## 2019-10-08 ENCOUNTER — Ambulatory Visit: Payer: Medicaid Other | Admitting: Nurse Practitioner

## 2019-10-08 DIAGNOSIS — G44209 Tension-type headache, unspecified, not intractable: Secondary | ICD-10-CM

## 2019-10-08 DIAGNOSIS — R5383 Other fatigue: Secondary | ICD-10-CM | POA: Diagnosis not present

## 2019-10-08 MED ORDER — NAPROXEN 500 MG PO TABS: 500.0000 mg | ORAL_TABLET | Freq: Two times a day (BID) | ORAL | 1 refills | Status: DC

## 2019-10-08 MED ORDER — CYCLOBENZAPRINE HCL 5 MG PO TABS: 5.0000 mg | ORAL_TABLET | Freq: Three times a day (TID) | ORAL | 1 refills | Status: DC | PRN

## 2019-10-08 NOTE — Addendum Note (Signed)
Addended by: Liliane Bade on: 10/08/2019 12:42 PM   Modules accepted: Orders

## 2019-10-08 NOTE — Progress Notes (Signed)
Virtual Visit via telephone Note Due to COVID-19 pandemic this visit was conducted virtually. This visit type was conducted due to national recommendations for restrictions regarding the COVID-19 Pandemic (e.g. social distancing, sheltering in place) in an effort to limit this patient's exposure and mitigate transmission in our community. All issues noted in this document were discussed and addressed.  A physical exam was not performed with this format.  I connected with Jaime Flores on [id number] at 11:40 by telephone and verified that I am speaking with the correct person using two identifiers. Jaime Flores is currently located at home and  No one is currently with  her11:55 during visit. The provider, Mary-Margaret [name], FNP is located in their office at time of visit.  I discussed the limitations, risks, security and privacy concerns of performing an evaluation and management service by telephone and the availability of in person appointments. I also discussed with the patient that there may be a patient responsible charge related to this service. The patient expressed understanding and agreed to proceed.   History and Present Illness:   Chief Complaint: Migraine   HPI Patient calls in c/o frequent headaches and fatigue. The headaches are at the base of her neck and radiates to both ears. They last several hours. Tylenol and motrin d not help with headaches. She says rotating her neck increases her headaches. She had bad one the other day and had to go to the urgent care and get a shot. She says the fatigue is awful. Said she slept 18 hours the other day and wanted to sleep some more. She has a history of low vitamin d and hgb and had headaches then.   Review of Systems  Constitutional: Negative for diaphoresis and weight loss.  Eyes: Negative for blurred vision, double vision and pain.  Respiratory: Negative for shortness of breath.   Cardiovascular: Negative for chest pain,  palpitations, orthopnea and leg swelling.  Gastrointestinal: Negative for abdominal pain.  Skin: Negative for rash.  Neurological: Negative for dizziness, sensory change, loss of consciousness, weakness and headaches.  Endo/Heme/Allergies: Negative for polydipsia. Does not bruise/bleed easily.  Psychiatric/Behavioral: Negative for memory loss. The patient does not have insomnia.   All other systems reviewed and are negative.    Observations/Objective: Alert and oriented- answers all questions appropriately No distress    Assessment and Plan: Jaime Flores in today with chief complaint of Migraine   1. Fatigue, unspecified type Labs pending - CBC with Differential/Platelet; Future - VITAMIN D 25 Hydroxy (Vit-D Deficiency, Fractures); Future - Thyroid Panel With TSH; Future  2. Acute non intractable tension-type headache flexeril- sedation precautions - cyclobenzaprine (FLEXERIL) 5 MG tablet; Take 1 tablet (5 mg total) by mouth 3 (three) times daily as needed for muscle spasms.  Dispense: 30 tablet; Refill: 1 - naproxen (NAPROSYN) 500 MG tablet; Take 1 tablet (500 mg total) by mouth 2 (two) times daily with a meal.  Dispense: 60 tablet; Refill: 1     Follow Up Instructions: prn    I discussed the assessment and treatment plan with the patient. The patient was provided an opportunity to ask questions and all were answered. The patient agreed with the plan and demonstrated an understanding of the instructions.   The patient was advised to call back or seek an in-person evaluation if the symptoms worsen or if the condition fails to improve as anticipated.  The above assessment and management plan was discussed with the patient. The patient verbalized understanding  of and has agreed to the management plan. Patient is aware to call the clinic if symptoms persist or worsen. Patient is aware when to return to the clinic for a follow-up visit. Patient educated on when it is  appropriate to go to the emergency department.   Time call ended:  11:55  I provided 1 minutes of non-face-to-face time during this encounter.    Mary-Margaret [name], FNP

## 2019-10-09 LAB — CBC WITH DIFFERENTIAL/PLATELET
Basophils Absolute: 0.1 10*3/uL (ref 0.0–0.2)
Basos: 1 %
EOS (ABSOLUTE): 0.2 10*3/uL (ref 0.0–0.4)
Eos: 2 %
Hematocrit: 38.3 % (ref 34.0–46.6)
Hemoglobin: 12.4 g/dL (ref 11.1–15.9)
Immature Grans (Abs): 0 10*3/uL (ref 0.0–0.1)
Immature Granulocytes: 0 %
Lymphocytes Absolute: 2.5 10*3/uL (ref 0.7–3.1)
Lymphs: 28 %
MCH: 29 pg (ref 26.6–33.0)
MCHC: 32.4 g/dL (ref 31.5–35.7)
MCV: 90 fL (ref 79–97)
Monocytes Absolute: 0.4 10*3/uL (ref 0.1–0.9)
Monocytes: 5 %
Neutrophils Absolute: 5.8 10*3/uL (ref 1.4–7.0)
Neutrophils: 64 %
Platelets: 316 10*3/uL (ref 150–450)
RBC: 4.28 x10E6/uL (ref 3.77–5.28)
RDW: 13 % (ref 11.7–15.4)
WBC: 9 10*3/uL (ref 3.4–10.8)

## 2019-10-09 LAB — THYROID PANEL WITH TSH
Free Thyroxine Index: 1.6 (ref 1.2–4.9)
T3 Uptake Ratio: 21 % — ABNORMAL LOW (ref 24–39)
T4, Total: 7.5 ug/dL (ref 4.5–12.0)
TSH: 1.37 u[IU]/mL (ref 0.450–4.500)

## 2019-10-09 LAB — VITAMIN D 25 HYDROXY (VIT D DEFICIENCY, FRACTURES): Vit D, 25-Hydroxy: 24.7 ng/mL — ABNORMAL LOW (ref 30.0–100.0)

## 2019-11-10 ENCOUNTER — Encounter: Payer: Self-pay | Admitting: Family Medicine

## 2019-11-10 ENCOUNTER — Encounter: Payer: Self-pay | Admitting: Allergy & Immunology

## 2019-11-11 ENCOUNTER — Telehealth: Payer: Medicaid Other | Admitting: Family Medicine

## 2019-11-13 ENCOUNTER — Other Ambulatory Visit: Payer: Self-pay

## 2019-11-13 ENCOUNTER — Ambulatory Visit (INDEPENDENT_AMBULATORY_CARE_PROVIDER_SITE_OTHER): Payer: Medicaid Other | Admitting: Allergy & Immunology

## 2019-11-13 ENCOUNTER — Encounter: Payer: Self-pay | Admitting: Allergy & Immunology

## 2019-11-13 DIAGNOSIS — R05 Cough: Secondary | ICD-10-CM

## 2019-11-13 DIAGNOSIS — T7800XA Anaphylactic reaction due to unspecified food, initial encounter: Secondary | ICD-10-CM | POA: Diagnosis not present

## 2019-11-13 DIAGNOSIS — J3089 Other allergic rhinitis: Secondary | ICD-10-CM | POA: Diagnosis not present

## 2019-11-13 DIAGNOSIS — J302 Other seasonal allergic rhinitis: Secondary | ICD-10-CM | POA: Diagnosis not present

## 2019-11-13 DIAGNOSIS — R059 Cough, unspecified: Secondary | ICD-10-CM

## 2019-11-13 MED ORDER — EPINEPHRINE 0.3 MG/0.3ML IJ SOAJ: 0.3000 mg | INTRAMUSCULAR | 2 refills | Status: DC | PRN

## 2019-11-13 MED ORDER — CETIRIZINE HCL 10 MG PO TABS: 10.0000 mg | ORAL_TABLET | Freq: Every day | ORAL | 5 refills | Status: DC

## 2019-11-13 NOTE — Progress Notes (Signed)
RE: Jaime Flores MRN: [id number] DOB: 02-Jan-1986 Date of Telemedicine Visit: 11/13/2019  Referring provider: [name], Fransisca Kaufmann, MD Primary care provider: [name], Fransisca Kaufmann, MD  Chief Complaint: Allergic Rhinitis  (doing well. has had 3 episodes of coughing/choked up since bing in the office. believes this was due to the pollen.) and Food Intolerance (she says that she had a reaction to ?white fish? 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 chest tightness. )   Telemedicine Follow Up Visit via Telephone: I connected with Jaime Flores for a follow up on 11/15/19 by telephone and verified that I am speaking with the correct person using two identifiers.   I discussed the limitations, risks, security and privacy concerns of performing an evaluation and management service by telephone and the availability of in person appointments. I also discussed with the patient that there may be a patient responsible charge related to this service. The patient expressed understanding and agreed to proceed.  Patient is at work.  Provider is at the office.  Visit start time: 10:13 AM Visit end time: 10:31 AM Insurance consent/check in by: [facility] consent and medical assistant/nurse: Olivia Mackie  History of Present Illness:  Jaime Flores has a history of the following: Patient Active Problem List   Diagnosis Date Noted  . Encounter for gynecological examination with Papanicolaou smear of cervix 06/03/2019  . 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 Cough 05/15/2019  . Seasonal and perennial allergic rhinitis 05/15/2019  . Bug bite 02/06/2019  . Hemorrhoids 09/26/2016  . Irregular intermenstrual bleeding 07/28/2015  . Amenorrhea 01/27/2013    History obtained from: chart review and patient.  Jaime Flores is a 34 y.o. female presenting for a follow up visit.  She was last seen in October 2020.  At that time, she underwent testing that was positive to  grasses, ragweed, molds, cat, and cockroach.  Her spirometry at the time was normal and her symptoms were controlled with cetirizine, so would not make any changes.  She has a history of intermittent anaphylactic reactions which are typically occurring in dusty environments.  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  These are mostly treated with Benadryl.  Her reactions consist of throat irritation and respiratory complaints.  Since the last visit, she has mostly done well. She has only had three since she saw me first in April 2020. She had a reaction when she was feeding her client's fish. She had some reactions on her feet as well. She thinks that this is pollen related. She uses her inhaler and she is good to go. She remains on the cetirizine daily as well. She did stop it for a period of time during [place]. She did not have to go to the hospital for her symptoms but she was unable to get into bed. Her son got it, but he was not having any symptoms at all. No one else in the family got it.  She is now not eating any fish at all. She does fairly well without the fish. She does need a new EpiPen. She needs a refill on cetirizine. She is fine with the albuterol.  Overall she says she is doing very well compared to prior to my seeing her.  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  She gets around 17,000 steps per day.  Otherwise, there have been no changes to her past medical history, surgical history, family history, or social history.  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  She has taken a daily antihistamine which seems to have modulated her reactions.  She has only had 3 since she  started seeing me.  She has pinpointed her reactions to certain triggering foods, and although testing has been negative she is avoiding these foods without any other reactions.  It is fine to continue to avoid these foods.  She is not at high risk of any malnutrition with avoidance of these foods.  I do think can see her every 12 months, but she wants to see me in 6 months and then space out her visits from there.  We are going to refill her Zyrtec and her albuterol.  Diagnostics: None.  Medication List:  Current Outpatient Medications  Medication Sig Dispense Refill  . albuterol (VENTOLIN HFA) 108 (90 Base) MCG/ACT inhaler Use 4 puffs every 4-6 hours as needed for cough or wheeze. 18 g 2  . cetirizine (ZYRTEC) 10 MG tablet Take 1 tablet (10 mg total) by mouth daily. 30 tablet 5  . citalopram (CELEXA) 40 MG tablet Take 1 tablet (40 mg total) by mouth daily. 90 tablet 1  . cyclobenzaprine (FLEXERIL) 5 MG tablet Take 1 tablet (5 mg total) by mouth 3 (three) times daily as needed for muscle spasms. 30 tablet 1  . diphenhydrAMINE (BENADRYL) 25 MG tablet Take 25 mg by mouth every 6 (six) hours as needed.    [name] EPINEPHrine 0.3 mg/0.3 mL IJ SOAJ injection Inject 0.3 mLs (0.3 mg total) into the muscle as needed for anaphylaxis. 2 each 2  . ibuprofen (ADVIL) 800 MG tablet Take 1 tablet (800 mg total) by mouth 3 (three) times daily. Take with food 60 tablet 0  . megestrol (MEGACE) 40 MG tablet Take 1 tablet (40 mg total) by mouth daily. (Patient taking differently: Take 40 mg by mouth daily as needed. ) 30 tablet 1  . Multiple Vitamins-Calcium (ONE-A-DAY WOMENS FORMULA PO) Take by mouth.    . traZODone (DESYREL) 50 MG tablet Take 0.5-1 tablets (25-50 mg total) by mouth at bedtime as needed for sleep. 30 tablet 0   No current facility-administered medications for this visit.   Allergies: Allergies  Allergen Reactions  . Doxycycline Shortness Of Breath  . Wellbutrin [Bupropion] Shortness Of Breath,  Nausea Only and Palpitations  . Tape Swelling    Plastic tape please use paper   I reviewed her past medical history, social history, family history, and environmental history and no significant changes have been reported from previous visits.  Review of Systems  Constitutional: Negative for activity change and appetite change.  HENT: Negative for congestion, postnasal drip, rhinorrhea, sinus pressure and sore throat.   Eyes: Negative for pain, discharge, redness and itching.  Respiratory: Negative for shortness of breath, wheezing and stridor.   Gastrointestinal: Negative for diarrhea, nausea and vomiting.  Musculoskeletal: Negative for arthralgias, joint swelling and myalgias.  Skin: Negative for rash.  Allergic/Immunologic: Negative for environmental allergies and food allergies.    Objective:  Physical exam not obtained as encounter was done via telephone.   Previous notes and tests were reviewed.  I discussed the assessment and treatment plan with  the patient. The patient was provided an opportunity to ask questions and all were answered. The patient agreed with the plan and demonstrated an understanding of the instructions.   The patient was advised to call back or seek an in-person evaluation if the symptoms worsen or if the condition fails to improve as anticipated.  I provided 18 minutes of non-face-to-face time during this encounter.  It was my pleasure to participate in Afton Vallo's care today. Please feel free to contact me with any questions or concerns.   Sincerely,  Valentina Shaggy, MD

## 2019-11-15 ENCOUNTER — Encounter: Payer: Self-pay | Admitting: Allergy & Immunology

## 2019-11-18 ENCOUNTER — Encounter: Payer: Self-pay | Admitting: Family Medicine

## 2019-11-18 ENCOUNTER — Telehealth (INDEPENDENT_AMBULATORY_CARE_PROVIDER_SITE_OTHER): Payer: Medicaid Other | Admitting: Family Medicine

## 2019-11-18 DIAGNOSIS — F339 Major depressive disorder, recurrent, unspecified: Secondary | ICD-10-CM | POA: Insufficient documentation

## 2019-11-18 DIAGNOSIS — F419 Anxiety disorder, unspecified: Secondary | ICD-10-CM | POA: Diagnosis not present

## 2019-11-18 MED ORDER — ARIPIPRAZOLE 5 MG PO TABS: 5.0000 mg | ORAL_TABLET | Freq: Every day | ORAL | 1 refills | Status: DC

## 2019-11-18 NOTE — Progress Notes (Signed)
Virtual Visit via mychart video Note  I connected with Jaime Flores on [id number] at 0900 by video and verified that I am speaking with the correct person using two identifiers. Jaime Flores is currently located at home and no other people are currently with her during visit. The provider, Fransisca Kaufmann Makaleigh Reinard, MD is located in their office at time of visit.  Call ended at [phone]  I discussed the limitations, risks, security and privacy concerns of performing an evaluation and management service by video and the availability of in person appointments. I also discussed with the patient that there may be a patient responsible charge related to this service. The patient expressed understanding and agreed to proceed.   History and Present Illness: Anxiety recheck Patient is calling in for a recheck on anxiety after increasing citalopram.  She is not sleeping.  She had thoughts of committing suicide last week.  She is worried about losing her job because she has missed.  Her step dad is declining in health and she feels like she doesn't have time for the kids and feels guilty.  She had thoughts of suicide but no plan and doesn't think she would go through with it. Patient had thoughts of cutting her wrist and shooting herself last week and her children are what keep her from doing. She has a good support. She is having trouble sleeping at night and uses flexeril to help sleep.  No diagnosis found.  Outpatient Encounter Medications as of 11/18/2019  Medication Sig  . albuterol (VENTOLIN HFA) 108 (90 Base) MCG/ACT inhaler Use 4 puffs every 4-6 hours as needed for cough or wheeze.  . cetirizine (ZYRTEC) 10 MG tablet Take 1 tablet (10 mg total) by mouth daily.  . citalopram (CELEXA) 40 MG tablet Take 1 tablet (40 mg total) by mouth daily.  . cyclobenzaprine (FLEXERIL) 5 MG tablet Take 1 tablet (5 mg total) by mouth 3 (three) times daily as needed for muscle spasms.  . diphenhydrAMINE (BENADRYL) 25  MG tablet Take 25 mg by mouth every 6 (six) hours as needed.  [name] EPINEPHrine 0.3 mg/0.3 mL IJ SOAJ injection Inject 0.3 mLs (0.3 mg total) into the muscle as needed for anaphylaxis.  [name] ibuprofen (ADVIL) 800 MG tablet Take 1 tablet (800 mg total) by mouth 3 (three) times daily. Take with food  . megestrol (MEGACE) 40 MG tablet Take 1 tablet (40 mg total) by mouth daily. (Patient taking differently: Take 40 mg by mouth daily as needed. )  . Multiple Vitamins-Calcium (ONE-A-DAY WOMENS FORMULA PO) Take by mouth.  . traZODone (DESYREL) 50 MG tablet Take 0.5-1 tablets (25-50 mg total) by mouth at bedtime as needed for sleep.   No facility-administered encounter medications on file as of 11/18/2019.    Review of Systems  Constitutional: Negative for chills and fever.  Eyes: Negative for visual disturbance.  Respiratory: Negative for chest tightness and shortness of breath.   Cardiovascular: Negative for chest pain and leg swelling.  Musculoskeletal: Negative for back pain and gait problem.  Skin: Negative for rash.  Neurological: Negative for light-headedness and headaches.  Psychiatric/Behavioral: Positive for dysphoric mood, sleep disturbance and suicidal ideas. Negative for agitation, behavioral problems, decreased concentration and self-injury. The patient is nervous/anxious.   All other systems reviewed and are negative.   Observations/Objective: Patient sounds comfortable and in no acute distress  Assessment and Plan: Problem List Items Addressed This Visit      Other   Anxiety - Primary  Relevant Medications   ARIPiprazole (ABILIFY) 5 MG tablet   Depression, recurrent (HCC)   Relevant Medications   ARIPiprazole (ABILIFY) 5 MG tablet      Patient would like FMLA for work for 2 weeks for depression   Follow up plan: Return in about 4 weeks (around 12/16/2019), or if symptoms worsen or fail to improve, for depression and anxiety.     I discussed the assessment and treatment  plan with the patient. The patient was provided an opportunity to ask questions and all were answered. The patient agreed with the plan and demonstrated an understanding of the instructions.   The patient was advised to call back or seek an in-person evaluation if the symptoms worsen or if the condition fails to improve as anticipated.  The above assessment and management plan was discussed with the patient. The patient verbalized understanding of and has agreed to the management plan. Patient is aware to call the clinic if symptoms persist or worsen. Patient is aware when to return to the clinic for a follow-up visit. Patient educated on when it is appropriate to go to the emergency department.    I provided 16 minutes of non-face-to-face time during this encounter.    Jaime Rancher, MD

## 2019-11-19 ENCOUNTER — Ambulatory Visit: Payer: Medicaid Other | Admitting: Family Medicine

## 2019-11-19 ENCOUNTER — Telehealth: Payer: Medicaid Other | Admitting: Family Medicine

## 2019-11-23 ENCOUNTER — Telehealth: Payer: Self-pay | Admitting: Allergy & Immunology

## 2019-11-23 NOTE — Telephone Encounter (Signed)
Patient called and said that she has had 2 allergy attacks and need to up her zyrtec or go on allergy injection. 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 [phone].

## 2019-11-23 NOTE — Telephone Encounter (Signed)
Dr. Gallagher please advise.  

## 2019-11-24 NOTE — Telephone Encounter (Signed)
Called and spoke with patient and advised of Zyrtec regimen. Went over allergy injections with the patient and the commitment it requires. Advised patient to take a Zyrtec the morning of her first allergy injection and to bring her EpiPen. Patient verbalized understanding. She has been scheduled to start allergy injections 3 weeks from tomorrow in the [place] office. Please advise Allergy Rx for her injections. Thank You.

## 2019-11-24 NOTE — Addendum Note (Signed)
Addended by: Valentina Shaggy on: 11/24/2019 06:24 PM   Modules accepted: Orders

## 2019-11-24 NOTE — Telephone Encounter (Signed)
We have gone back and forth about starting allergy shots with her.  We will just go ahead and do that.  We can also increase her Zyrtec in the meantime to 1 tablet 3 times a day.  Salvatore Marvel, MD [facility] [place]

## 2019-11-24 NOTE — Telephone Encounter (Signed)
Prescription for IT written.   Salvatore Marvel, MD [facility] [place]

## 2019-11-25 ENCOUNTER — Encounter: Payer: Self-pay | Admitting: Family Medicine

## 2019-11-25 DIAGNOSIS — J3081 Allergic rhinitis due to animal (cat) (dog) hair and dander: Secondary | ICD-10-CM | POA: Diagnosis not present

## 2019-11-25 NOTE — Progress Notes (Signed)
VIALS EXP 11-24-20

## 2019-11-26 ENCOUNTER — Encounter: Payer: Self-pay | Admitting: Allergy & Immunology

## 2019-11-26 ENCOUNTER — Telehealth: Payer: Self-pay | Admitting: *Deleted

## 2019-11-26 ENCOUNTER — Other Ambulatory Visit: Payer: Self-pay

## 2019-11-26 ENCOUNTER — Ambulatory Visit (INDEPENDENT_AMBULATORY_CARE_PROVIDER_SITE_OTHER): Payer: Medicaid Other | Admitting: Allergy & Immunology

## 2019-11-26 VITALS — BP 122/80 | HR 89 | Temp 98.1°F | Resp 16 | Ht 62.5 in | Wt 153.0 lb

## 2019-11-26 DIAGNOSIS — J3089 Other allergic rhinitis: Secondary | ICD-10-CM | POA: Diagnosis not present

## 2019-11-26 DIAGNOSIS — R05 Cough: Secondary | ICD-10-CM

## 2019-11-26 DIAGNOSIS — J302 Other seasonal allergic rhinitis: Secondary | ICD-10-CM

## 2019-11-26 DIAGNOSIS — R059 Cough, unspecified: Secondary | ICD-10-CM

## 2019-11-26 DIAGNOSIS — T7800XD Anaphylactic reaction due to unspecified food, subsequent encounter: Secondary | ICD-10-CM | POA: Diagnosis not present

## 2019-11-26 DIAGNOSIS — T7800XA Anaphylactic reaction due to unspecified food, initial encounter: Secondary | ICD-10-CM

## 2019-11-26 NOTE — Telephone Encounter (Signed)
FMLA paperwork has been received and filled out to the best ability and has been given to Dr. Ernst Bowler to complete.

## 2019-11-26 NOTE — Patient Instructions (Addendum)
1. Allergic reactions - We are going to get allergy shots started.  - Continue with Zyrtec three times daily.  - Send [place] [facility] paperwork. - Start the prednisone taper today. - Work note provided.    2. Return in about 6 months (around 05/28/2020). This can be an in-person, a virtual Webex or a telephone follow up visit.   Please inform us of any Emergency Department visits, hospitalizations, or changes in symptoms. Call us before going to the ED for breathing or allergy symptoms since we might be able to fit you in for a sick visit. Feel free to contact us anytime with any questions, problems, or concerns.  It was a pleasure to see you again today!  Websites that have reliable patient information: 1. American Academy of Asthma, Allergy, and Immunology: www.aaaai.org 2. Food Allergy Research and Education (FARE): foodallergy.org 3. Mothers of Asthmatics: http://www.asthmacommunitynetwork.org 4. American College of Allergy, Asthma, and Immunology: www.acaai.org   COVID-19 Vaccine Information can be found at: ShippingScam.co.uk For questions related to vaccine distribution or appointments, please email [email or web address] or call [phone].     "Like" [place] on Facebook and Instagram for our latest updates!       HAPPY SPRING!  Make sure you are registered to vote! If you have moved or changed any of your contact information, you will need to get this updated before voting!  In some cases, you MAY be able to register to vote online: [email or web address]

## 2019-11-26 NOTE — Progress Notes (Signed)
FOLLOW UP  Date of Service/Encounter:  11/26/19   Assessment:   Cough - with recent allergic reactions  Anaphylaxis due to food - unknown at this time  Seasonal and perennial allergic rhinitis - starting allergen immunotherapy at the end of the month  Plan/Recommendations:   1. Allergic reactions - We are going to get allergy shots started.  - Continue with Zyrtec three times daily.  - Send [place] [facility] paperwork. - Start the prednisone taper today. - Work note provided.    2. Return in about 6 months (around 05/28/2020). This can be an in-person, a virtual Webex or a telephone follow up visit.   Subjective:   Jaime Flores is a 34 y.o. female presenting today for follow up of  Chief Complaint  Patient presents with   coughing fit yesterday    hadn't eaten anything yesterday within 2 hours of the coughing fit    Jaime Flores has a history of the following: Patient Active Problem List   Diagnosis Date Noted   Anxiety 11/18/2019   Depression, recurrent ([place]) 11/18/2019   Encounter for gynecological examination with Papanicolaou smear of cervix 06/03/2019   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   Cough 05/15/2019   Seasonal and perennial allergic rhinitis 05/15/2019   Bug bite 02/06/2019   Hemorrhoids 09/26/2016   Irregular intermenstrual bleeding 07/28/2015   Amenorrhea 01/27/2013    History obtained from: chart review and patient.  Jaime Flores is a 34 y.o. female presenting for a sick visit.  She has a history of intermittent allergic reactions for which we do not have a trigger at this time.  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  When we saw her in April 2021, she was doing fairly well.  She was on daily antihistamines which seem to have decreased the frequency of her reactions.  In the interim, she had a few additional reactions this  week and requested to start allergen immunotherapy.  She is scheduled to start that at the end of May in [place].  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  She started sneezing and having an uncontrollable cough.  She was escorted off of the bus and a school nurse looked at her who felt she was having an asthma attack.  She has not been febrile during this episode.  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  She is requesting a school note so that her job is protected.  She does need to sign her allergen immunotherapy consent today.  She has been using her cetirizine 10 mg up to 3 times a day to improve the symptoms.  She did use albuterol which helped calm the symptoms but did not resolve them daily.  Otherwise, there have been no changes to her past medical history, surgical history, family history, or social history.    Review of Systems  Constitutional: Negative.  Negative for chills, fever, malaise/fatigue and weight loss.  HENT: Negative.  Negative for ear discharge, ear pain, sinus pain and sore throat.   Eyes: Negative for pain, discharge and redness.  Respiratory: Negative for cough, sputum production and wheezing.   Cardiovascular: Negative.  Negative for chest pain and palpitations.  Gastrointestinal: Negative for abdominal pain, constipation, diarrhea,  heartburn, nausea and vomiting.  Skin: Negative.  Negative for itching and rash.  Neurological: Negative for dizziness and headaches.  Endo/Heme/Allergies: Negative for environmental allergies. Does not bruise/bleed easily.       Objective:   Blood pressure 122/80, pulse 89, temperature 98.1 F (36.7 C), temperature source Temporal, resp. rate 16, height 5' 2.5" (1.588 m), weight 153 lb (69.4 kg), SpO2 97 %. Body mass index is 27.54 kg/m.   Physical Exam:  Physical Exam  Constitutional: She appears well-developed.  Very  talkative.  HENT:  Head: Normocephalic and atraumatic.  Right Ear: Tympanic membrane, external ear and ear canal normal.  Left Ear: Tympanic membrane and ear canal normal.  Nose: No mucosal edema, rhinorrhea, nasal deformity or septal deviation. No epistaxis. Right sinus exhibits no maxillary sinus tenderness and no frontal sinus tenderness. Left sinus exhibits no maxillary sinus tenderness and no frontal sinus tenderness.  Mouth/Throat: Uvula is midline and oropharynx is clear and moist. Mucous membranes are not pale and not dry.  Eyes: Pupils are equal, round, and reactive to light. Conjunctivae and EOM are normal. Right eye exhibits no chemosis and no discharge. Left eye exhibits no chemosis and no discharge. Right conjunctiva is not injected. Left conjunctiva is not injected.  Cardiovascular: Normal rate, regular rhythm and normal heart sounds.  Respiratory: Effort normal and breath sounds normal. No accessory muscle usage. No tachypnea. No respiratory distress. She has no wheezes. She has no rhonchi. She has no rales. She exhibits no tenderness.  Lymphadenopathy:    She has no cervical adenopathy.  Neurological: She is alert.  Skin: No abrasion, no petechiae and no rash noted. Rash is not papular, not vesicular and not urticarial. No erythema. No pallor.  Psychiatric: She has a normal mood and affect.     Diagnostic studies:    Spirometry: results normal (FEV1: 2.51/81%, FVC: 3.06/84%, FEV1/FVC: 82%).    Spirometry consistent with normal pattern.   Allergy Studies: none     Salvatore Marvel, MD  [facility] [place]

## 2019-11-30 ENCOUNTER — Other Ambulatory Visit: Payer: Self-pay

## 2019-11-30 ENCOUNTER — Ambulatory Visit
Admission: EM | Admit: 2019-11-30 | Discharge: 2019-11-30 | Disposition: A | Discharge status: Home / Self Care | Payer: Medicaid Other | Attending: Emergency Medicine | Admitting: Emergency Medicine

## 2019-11-30 ENCOUNTER — Encounter: Payer: Self-pay | Admitting: Emergency Medicine

## 2019-11-30 DIAGNOSIS — J069 Acute upper respiratory infection, unspecified: Secondary | ICD-10-CM | POA: Diagnosis not present

## 2019-11-30 DIAGNOSIS — Z20822 Contact with and (suspected) exposure to covid-19: Secondary | ICD-10-CM | POA: Diagnosis not present

## 2019-11-30 DIAGNOSIS — L0291 Cutaneous abscess, unspecified: Secondary | ICD-10-CM | POA: Diagnosis not present

## 2019-11-30 DIAGNOSIS — J302 Other seasonal allergic rhinitis: Secondary | ICD-10-CM | POA: Diagnosis not present

## 2019-11-30 LAB — POCT RAPID STREP A (OFFICE): Rapid Strep A Screen: NEGATIVE

## 2019-11-30 MED ORDER — FLUTICASONE PROPIONATE 50 MCG/ACT NA SUSP: 2.0000 | Freq: Every day | NASAL | 0 refills | Status: DC

## 2019-11-30 MED ORDER — MUPIROCIN CALCIUM 2 % EX CREA: 1.0000 "application " | TOPICAL_CREAM | Freq: Two times a day (BID) | CUTANEOUS | 0 refills | Status: DC

## 2019-11-30 MED ORDER — BENZONATATE 100 MG PO CAPS: 100.0000 mg | ORAL_CAPSULE | Freq: Three times a day (TID) | ORAL | 0 refills | Status: DC

## 2019-11-30 MED ORDER — MUPIROCIN 2 % EX OINT: 1.0000 "application " | TOPICAL_OINTMENT | Freq: Two times a day (BID) | CUTANEOUS | 1 refills | Status: DC

## 2019-11-30 NOTE — ED Triage Notes (Signed)
Sniffles, cough, sore throat that started on Saturday.  Patient denies fever

## 2019-11-30 NOTE — ED Provider Notes (Signed)
[place]   [id number] 11/30/19 Arrival Time: [id number]   CC: COVID symptoms  SUBJECTIVE: History from: patient.  Jaime Flores is a 34 y.o. female who presents with runny nose, slight congestion, cough, sore throat, nausea, and few episodes of diarrhea x 2 days.  Admits to COVID exposure at work.  Has tried OTC medications without relief.  Denies aggravating factors.  Reports previous COVID infection in January of this year.   Denies fever, chills, SOB, wheezing, chest pain, vomiting, changes in bladder habits.     ROS: As per HPI.  All other pertinent ROS negative.     Past Medical History:  Diagnosis Date  . Amenorrhea 01/27/2013  . Angio-edema   . Breast pain 03/10/2015  . Encounter for menstrual regulation 07/05/2015  . Irregular intermenstrual bleeding 07/28/2015  . Irregular periods 07/05/2015  . Screening for STD (sexually transmitted disease) 08/07/2013  . Urticaria   . UTI (urinary tract infection)   . Vaginal discharge 06/10/2013   Past Surgical History:  Procedure Laterality Date  . APPENDECTOMY    . CHOLECYSTECTOMY    . TUBAL LIGATION     Allergies  Allergen Reactions  . Doxycycline Shortness Of Breath  . Wellbutrin [Bupropion] Shortness Of Breath, Nausea Only and Palpitations  . Tape Swelling    Plastic tape please use paper   No current facility-administered medications on file prior to encounter.   Current Outpatient Medications on File Prior to Encounter  Medication Sig Dispense Refill  . ARIPiprazole (ABILIFY) 5 MG tablet Take 1 tablet (5 mg total) by mouth daily. 30 tablet 1  . cetirizine (ZYRTEC) 10 MG tablet Take 1 tablet (10 mg total) by mouth daily. 30 tablet 5  . citalopram (CELEXA) 40 MG tablet Take 1 tablet (40 mg total) by mouth daily. 90 tablet 1  . Multiple Vitamins-Calcium (ONE-A-DAY WOMENS FORMULA PO) Take by mouth.    [name] albuterol (VENTOLIN HFA) 108 (90 Base) MCG/ACT inhaler Use 4 puffs every 4-6 hours as needed for cough or  wheeze. 18 g 2  . cyclobenzaprine (FLEXERIL) 5 MG tablet Take 1 tablet (5 mg total) by mouth 3 (three) times daily as needed for muscle spasms. 30 tablet 1  . diphenhydrAMINE (BENADRYL) 25 MG tablet Take 25 mg by mouth every 6 (six) hours as needed.    [name] EPINEPHrine 0.3 mg/0.3 mL IJ SOAJ injection Inject 0.3 mLs (0.3 mg total) into the muscle as needed for anaphylaxis. 2 each 2  . ibuprofen (ADVIL) 800 MG tablet Take 1 tablet (800 mg total) by mouth 3 (three) times daily. Take with food 60 tablet 0  . megestrol (MEGACE) 40 MG tablet Take 1 tablet (40 mg total) by mouth daily. (Patient taking differently: Take 40 mg by mouth daily as needed. ) 30 tablet 1   Social History   Socioeconomic History  . Marital status: Single    Spouse name: Not on file  . Number of children: 2  . Years of education: Not on file  . Highest education level: Not on file  Occupational History  . Not on file  Tobacco Use  . Smoking status: Current Every Day Smoker    Packs/day: 0.50    Types: Cigarettes  . Smokeless tobacco: Never Used  Substance and Sexual Activity  . Alcohol use: No  . Drug use: No  . Sexual activity: Yes    Birth control/protection: Surgical    Comment: tubal  Other Topics Concern  . Not on file  Social  History Narrative  . Not on file   Social Determinants of Health   Financial Resource Strain:   . Difficulty of Paying Living Expenses:   Food Insecurity:   . Worried About Charity fundraiser in the Last Year:   . Arboriculturist in the Last Year:   Transportation Needs:   . Film/video editor (Medical):   [name] Lack of Transportation (Non-Medical):   Physical Activity:   . Days of Exercise per Week:   . Minutes of Exercise per Session:   Stress:   . Feeling of Stress :   Social Connections:   . Frequency of Communication with Friends and Family:   . Frequency of Social Gatherings with Friends and Family:   . Attends Religious Services:   . Active Member of Clubs or  Organizations:   . Attends Archivist Meetings:   [name] Marital Status:   Intimate Partner Violence:   . Fear of Current or Ex-Partner:   . Emotionally Abused:   [name] Physically Abused:   . Sexually Abused:    Family History  Problem Relation Age of Onset  . Autism Son   . Diabetes Maternal Grandmother   . Stroke Maternal Grandmother   . Heart failure Maternal Grandmother   . Stroke Maternal Grandfather   . Stroke Father   . Seizures Father   . Allergies Father        penicillin  . Other Mother        herpes  . Heart failure Paternal Grandfather   . Diabetes Paternal Grandmother   . Hypertension Paternal Grandmother   . Autism Brother   . Other Paternal Uncle        heart exploded  . Cancer Maternal Aunt   . Diabetes Maternal Aunt   . Hypertension Maternal Aunt   . Immunodeficiency Neg Hx   . Urticaria Neg Hx   . Eczema Neg Hx   . Atopy Neg Hx   . Asthma Neg Hx   . Angioedema Neg Hx   . Allergic rhinitis Neg Hx     OBJECTIVE:  Vitals:   11/30/19 1151  BP: 115/75  Pulse: 80  Resp: 16  Temp: 98.1 F (36.7 C)  TempSrc: Oral  SpO2: 97%     General appearance: alert; appears fatigued, but nontoxic; speaking in full sentences and tolerating own secretions HEENT: NCAT; Ears: EACs clear, TMs pearly gray; Eyes: PERRL.  EOM grossly intact. Nose: nares patent without rhinorrhea, Throat: oropharynx clear, tonsils non erythematous or enlarged, uvula midline  Neck: supple without LAD Lungs: unlabored respirations, symmetrical air entry; cough: absent; no respiratory distress; CTAB Heart: regular rate and rhythm.   Skin: warm and dry; erythematous papule to LT upper arm  apx 1 cm in diameter TTP, no drainage or bleeding Psychological: alert and cooperative; normal mood and affect  LABS:  Results for orders placed or performed during the hospital encounter of 11/30/19 (from the past 24 hour(s))  POCT rapid strep A     Status: None   Collection Time: 11/30/19 12:20  PM  Result Value Ref Range   Rapid Strep A Screen Negative Negative     ASSESSMENT & PLAN:  1. Viral URI   2. Suspected COVID-19 virus infection   3. Abscess     Meds ordered this encounter  Medications  . fluticasone (FLONASE) 50 MCG/ACT nasal spray    Sig: Place 2 sprays into both nostrils daily.    Dispense:  16 g  Refill:  0    Order Specific Question:   Supervising Provider    Answer:   Raylene Everts [id number]  . benzonatate (TESSALON) 100 MG capsule    Sig: Take 1 capsule (100 mg total) by mouth every 8 (eight) hours.    Dispense:  21 capsule    Refill:  0    Order Specific Question:   Supervising Provider    Answer:   Raylene Everts [id number]  . DISCONTD: mupirocin cream (BACTROBAN) 2 %    Sig: Apply 1 application topically 2 (two) times daily.    Dispense:  15 g    Refill:  0    Order Specific Question:   Supervising Provider    Answer:   Raylene Everts [id number]  . mupirocin ointment (BACTROBAN) 2 %    Sig: Apply 1 application topically 2 (two) times daily.    Dispense:  30 g    Refill:  1    Order Specific Question:   Supervising Provider    Answer:   Raylene Everts [id number]   Strep negative.  Culture sent.  We will call you with abnormal results COVID testing ordered.  It will take between 2-5 days for test results.  Someone will contact you regarding abnormal results.    In the meantime: You should remain isolated in your home for 10 days from symptom onset AND greater than 72 hours after symptoms resolution (absence of fever without the use of fever-reducing medication and improvement in respiratory symptoms), whichever is longer Get plenty of rest and push fluids Tessalon Perles prescribed for cough Use OTC zyrtec for nasal congestion, runny nose, and/or sore throat Use OTC flonase for nasal congestion and runny nose Use medications daily for symptom relief Use OTC medications like ibuprofen or tylenol as needed fever or pain Call or  go to the ED if you have any new or worsening symptoms such as fever, worsening cough, shortness of breath, chest tightness, chest pain, turning blue, changes in mental status, etc...   Bactroban ointment for abscess on LT arm.   Reviewed expectations re: course of current medical issues. Questions answered. Outlined signs and symptoms indicating need for more acute intervention. Patient verbalized understanding. After Visit Summary given.         Lestine Box, PA-C 11/30/19 1247

## 2019-11-30 NOTE — Discharge Instructions (Signed)
Strep negative.  Culture sent.  We will call you with abnormal results COVID testing ordered.  It will take between 2-5 days for test results.  Someone will contact you regarding abnormal results.    In the meantime: You should remain isolated in your home for 10 days from symptom onset AND greater than 72 hours after symptoms resolution (absence of fever without the use of fever-reducing medication and improvement in respiratory symptoms), whichever is longer Get plenty of rest and push fluids Tessalon Perles prescribed for cough Use OTC zyrtec for nasal congestion, runny nose, and/or sore throat Use OTC flonase for nasal congestion and runny nose Use medications daily for symptom relief Use OTC medications like ibuprofen or tylenol as needed fever or pain Call or go to the ED if you have any new or worsening symptoms such as fever, worsening cough, shortness of breath, chest tightness, chest pain, turning blue, changes in mental status, etc..[name]

## 2019-11-30 NOTE — Telephone Encounter (Signed)
Jaime Flores called to check on the status of paperwork. Jaime Flores was informed we were waiting for Dr. Ernst Bowler to complete them. Jaime Flores was informed that Dr. Ernst Bowler is not in the office until tomorrow.

## 2019-12-01 ENCOUNTER — Other Ambulatory Visit: Payer: Self-pay | Admitting: Family Medicine

## 2019-12-01 DIAGNOSIS — F419 Anxiety disorder, unspecified: Secondary | ICD-10-CM

## 2019-12-01 LAB — NOVEL CORONAVIRUS, NAA: SARS-CoV-2, NAA: NOT DETECTED

## 2019-12-01 LAB — SARS-COV-2, NAA 2 DAY TAT

## 2019-12-01 NOTE — Telephone Encounter (Signed)
Paperwork is currently being worked on by the provider.

## 2019-12-01 NOTE — Telephone Encounter (Signed)
FMLA paperwork has been completed. Called back the patient and informed. Patient verbalized understanding. She wanted the paper work to be faxed to [employer] to the attention of Mrs. Aleene Davidson at [phone]. I advised that I would fax this over, make copies for [place] to keep, and mail the originals to her home. Patient verbalized understanding and was happy with this plan. Paperwork has been faxed to the requested number.

## 2019-12-03 LAB — CULTURE, GROUP A STREP (THRC)

## 2019-12-03 NOTE — Telephone Encounter (Signed)
Patient called and states that on her FMLA paperwork it is [employer] and should be [employer]. Patient also states that the way the dates are on the paperwork it is stated that she is out for 5 years. Patient would like corrected forms faxed to [facility] to the attention of Mrs. Aleene Davidson at [phone]. Patient informed that we would call her when paperwork is faxed. Patient mentioned if they could be corrected today that would be great as her boss is needing them.  Please advise.

## 2019-12-03 NOTE — Telephone Encounter (Signed)
Called and spoke with the patient and informed that we would need [facility] forms sent to [place] in order to do these corrections and asked for her to send the other forms we filled out so that we will know what corrections to make since the other forms are currently being sent to the scan center. Patient verbalized understanding and will send over forms for [place] to work on. She stated as long as it is completed before May 26th.

## 2019-12-08 ENCOUNTER — Emergency Department (HOSPITAL_COMMUNITY)
Admission: EM | Admit: 2019-12-08 | Discharge: 2019-12-08 | Disposition: A | Discharge status: Home / Self Care | Payer: Medicaid Other | Attending: Emergency Medicine | Admitting: Emergency Medicine

## 2019-12-08 ENCOUNTER — Other Ambulatory Visit: Payer: Self-pay

## 2019-12-08 ENCOUNTER — Encounter: Payer: Self-pay | Admitting: *Deleted

## 2019-12-08 ENCOUNTER — Encounter (HOSPITAL_COMMUNITY): Payer: Self-pay | Admitting: Emergency Medicine

## 2019-12-08 DIAGNOSIS — R5381 Other malaise: Secondary | ICD-10-CM | POA: Diagnosis not present

## 2019-12-08 DIAGNOSIS — T7840XA Allergy, unspecified, initial encounter: Secondary | ICD-10-CM | POA: Insufficient documentation

## 2019-12-08 DIAGNOSIS — F17210 Nicotine dependence, cigarettes, uncomplicated: Secondary | ICD-10-CM | POA: Diagnosis not present

## 2019-12-08 DIAGNOSIS — Z79899 Other long term (current) drug therapy: Secondary | ICD-10-CM | POA: Diagnosis not present

## 2019-12-08 DIAGNOSIS — Z743 Need for continuous supervision: Secondary | ICD-10-CM | POA: Diagnosis not present

## 2019-12-08 MED ORDER — DIPHENHYDRAMINE HCL 25 MG PO CAPS
50.0000 mg | ORAL_CAPSULE | Freq: Once | ORAL | Status: AC
  Administered 2019-12-08: 50 mg via ORAL
  Filled 2019-12-08: qty 2

## 2019-12-08 MED ORDER — FAMOTIDINE 20 MG PO TABS
20.0000 mg | ORAL_TABLET | Freq: Once | ORAL | Status: AC
  Administered 2019-12-08: 20 mg via ORAL
  Filled 2019-12-08: qty 1

## 2019-12-08 NOTE — ED Provider Notes (Signed)
[facility] EMERGENCY DEPARTMENT Provider Note   CSN: [id number] Arrival date & time: 12/08/19  1604     History Chief Complaint  Patient presents with  . Allergic Reaction    Jaime Flores is a 34 y.o. female.  34 y/o F with hx of environmental allergies presenting to the ER for "allergy attack". Patient reports that she gets these allergy attacks occasionally from environmental allergies. She gest a sore throat like it is swelling up, itchy eyes and cough/wheeze. Reports they usually go away with benadryl and albuterol. Today she tried albuterol and this did not help so she reports she needed to use her epipen. After epipen she is at her baseline.         Past Medical History:  Diagnosis Date  . Amenorrhea 01/27/2013  . Angio-edema   . Breast pain 03/10/2015  . Encounter for menstrual regulation 07/05/2015  . Irregular intermenstrual bleeding 07/28/2015  . Irregular periods 07/05/2015  . Screening for STD (sexually transmitted disease) 08/07/2013  . Urticaria   . UTI (urinary tract infection)   . Vaginal discharge 06/10/2013    Patient Active Problem List   Diagnosis Date Noted  . Anxiety 11/18/2019  . Depression, recurrent ([place]) 11/18/2019  . Encounter for gynecological examination with Papanicolaou smear of cervix 06/03/2019  . 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 Cough 05/15/2019  . Seasonal and perennial allergic rhinitis 05/15/2019  . Bug bite 02/06/2019  . Hemorrhoids 09/26/2016  . Irregular intermenstrual bleeding 07/28/2015  . Amenorrhea 01/27/2013    Past Surgical History:  Procedure Laterality Date  . APPENDECTOMY    . CHOLECYSTECTOMY    . TUBAL LIGATION       OB History    Gravida  2   Para  2   Term  1   Preterm  1   AB      Living  2     SAB      TAB      Ectopic      Multiple      Live Births              Family History  Problem Relation Age of Onset  . Autism Son   . Diabetes Maternal  Grandmother   . Stroke Maternal Grandmother   . Heart failure Maternal Grandmother   . Stroke Maternal Grandfather   . Stroke Father   . Seizures Father   . Allergies Father        penicillin  . Other Mother        herpes  . Heart failure Paternal Grandfather   . Diabetes Paternal Grandmother   . Hypertension Paternal Grandmother   . Autism Brother   . Other Paternal Uncle        heart exploded  . Cancer Maternal Aunt   . Diabetes Maternal Aunt   . Hypertension Maternal Aunt   . Immunodeficiency Neg Hx   . Urticaria Neg Hx   . Eczema Neg Hx   . Atopy Neg Hx   . Asthma Neg Hx   . Angioedema Neg Hx   . Allergic rhinitis Neg Hx     Social History   Tobacco Use  . Smoking status: Current Every Day Smoker    Packs/day: 0.50    Types: Cigarettes  . Smokeless tobacco: Never Used  Substance Use Topics  . Alcohol use: No  . Drug use: No    Home Medications Prior to Admission medications  Medication Sig Start Date End Date Taking? Authorizing Provider  albuterol (VENTOLIN HFA) 108 (90 Base) MCG/ACT inhaler Use 4 puffs every 4-6 hours as needed for cough or wheeze. 05/15/19   Valentina Shaggy, MD  ARIPiprazole (ABILIFY) 5 MG tablet Take 1 tablet (5 mg total) by mouth daily. 11/18/19   [name], Fransisca Kaufmann, MD  benzonatate (TESSALON) 100 MG capsule Take 1 capsule (100 mg total) by mouth every 8 (eight) hours. 11/30/19   Wurst, [place], PA-C  cetirizine (ZYRTEC) 10 MG tablet Take 1 tablet (10 mg total) by mouth daily. 11/13/19 12/13/19  Valentina Shaggy, MD  citalopram (CELEXA) 40 MG tablet TAKE 1 TABLET DAILY 12/01/19   [name], Fransisca Kaufmann, MD  cyclobenzaprine (FLEXERIL) 5 MG tablet Take 1 tablet (5 mg total) by mouth 3 (three) times daily as needed for muscle spasms. 10/08/19   [name] Mary-Margaret, FNP  diphenhydrAMINE (BENADRYL) 25 MG tablet Take 25 mg by mouth every 6 (six) hours as needed.    [provider]  EPINEPHrine 0.3 mg/0.3 mL IJ SOAJ injection Inject  0.3 mLs (0.3 mg total) into the muscle as needed for anaphylaxis. 11/13/19   Valentina Shaggy, MD  fluticasone [facility]) 50 MCG/ACT nasal spray Place 2 sprays into both nostrils daily. 11/30/19   Wurst, [place], PA-C  ibuprofen (ADVIL) 800 MG tablet Take 1 tablet (800 mg total) by mouth 3 (three) times daily. Take with food 09/24/19   Avegno, Darrelyn Hillock, FNP  megestrol (MEGACE) 40 MG tablet Take 1 tablet (40 mg total) by mouth daily. Patient taking differently: Take 40 mg by mouth daily as needed.  01/02/18   Estill Dooms, NP  Multiple Vitamins-Calcium (ONE-A-DAY WOMENS FORMULA PO) Take by mouth.    [provider]  mupirocin ointment (BACTROBAN) 2 % Apply 1 application topically 2 (two) times daily. 11/30/19   Wurst, [place], PA-C    Allergies    Doxycycline, Wellbutrin [bupropion], and Tape  Review of Systems   Review of Systems  Constitutional: Negative for appetite change, chills and fever.  HENT: Positive for sneezing and sore throat.   Eyes: Positive for itching.  Respiratory: Positive for cough and shortness of breath. Negative for choking.   Cardiovascular: Negative for palpitations and leg swelling.  Gastrointestinal: Negative for abdominal pain, nausea and vomiting.  Genitourinary: Negative for dysuria.  Musculoskeletal: Negative for back pain.  Skin: Negative for rash.  Allergic/Immunologic: Positive for environmental allergies.  Neurological: Negative for dizziness, light-headedness and headaches.    Physical Exam Updated Vital Signs BP 99/64   Pulse 77   Temp 98.1 F (36.7 C) (Oral)   Resp 20   Ht 5' 3.25" (1.607 m)   Wt 71.7 kg   LMP 11/01/2019   SpO2 100%   BMI 27.77 kg/m   Physical Exam Vitals and nursing note reviewed.  Constitutional:      General: She is not in acute distress.    Appearance: Normal appearance. She is not ill-appearing, toxic-appearing or diaphoretic.  HENT:     Head: Normocephalic.     Nose: Nose normal. No  congestion.     Mouth/Throat:     Mouth: Mucous membranes are moist.     Pharynx: Oropharynx is clear. No oropharyngeal exudate or posterior oropharyngeal erythema.     Comments: Airway patent Eyes:     Conjunctiva/sclera: Conjunctivae normal.     Pupils: Pupils are equal, round, and reactive to light.  Cardiovascular:     Rate and Rhythm: Normal rate and regular  rhythm.  Pulmonary:     Effort: Pulmonary effort is normal.     Breath sounds: Normal breath sounds.  Abdominal:     General: Abdomen is flat.     Palpations: Abdomen is soft.  Skin:    General: Skin is warm and dry.     Findings: No rash.  Neurological:     Mental Status: She is alert and oriented to person, place, and time.  Psychiatric:        Mood and Affect: Mood normal.     ED Results / Procedures / Treatments   Labs (all labs ordered are listed, but only abnormal results are displayed) Labs Reviewed - No data to display  EKG None  Radiology No results found.  Procedures Procedures (including critical care time)  Medications Ordered in ED Medications  famotidine (PEPCID) tablet 20 mg (20 mg Oral Given 12/08/19 1644)  diphenhydrAMINE (BENADRYL) capsule 50 mg (50 mg Oral Given 12/08/19 1642)    ED Course  I have reviewed the triage vital signs and the nursing notes.  Pertinent labs & imaging results that were available during my care of the patient were reviewed by me and considered in my medical decision making (see chart for details).  Clinical Course as of Dec 08 2107  Tue Dec 08, 2019  1717 Patient with reported alelrgic reation resolved with epipen. Hx of the same in the past related to environmental allergies. Patient given additional benadryl and pepcid here in the ED. Remained well appearing and symptom free.    [KM]    Clinical Course User Index [KM] Kristine Royal   MDM Rules/Calculators/A&P                      Based on review of vitals, medical screening exam, lab work and/or  imaging, there does not appear to be an acute, emergent etiology for the patient's symptoms. Counseled pt on good return precautions and encouraged both PCP and ED follow-up as needed.  Prior to discharge, I also discussed incidental imaging findings with patient in detail and advised appropriate, recommended follow-up in detail.  Clinical Impression: 1. Allergic reaction, initial encounter     Disposition: Discharge  Prior to providing a prescription for a controlled substance, I independently reviewed the patient's recent prescription history on the [place]. The patient had no recent or regular prescriptions and was deemed appropriate for a brief, less than 3 day prescription of narcotic for acute analgesia.  This note was prepared with assistance of Systems analyst. Occasional wrong-word or sound-a-like substitutions may have occurred due to the inherent limitations of voice recognition software.  Final Clinical Impression(s) / ED Diagnoses Final diagnoses:  Allergic reaction, initial encounter    Rx / DC Orders ED Discharge Orders    None       Kristine Royal 12/08/19 2109    Margette Fast, MD 12/09/19 [phone]

## 2019-12-08 NOTE — Telephone Encounter (Signed)
Have not received new FMLA forms yet for the patient. Called and left a voicemail asking for her to return call to discuss.

## 2019-12-08 NOTE — Telephone Encounter (Signed)
Patient called back and will be sending a link through e-mail to print of the correct FMLA forms.

## 2019-12-08 NOTE — ED Triage Notes (Signed)
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

## 2019-12-08 NOTE — Telephone Encounter (Signed)
FMLA paperwork has been completed. Copies have been made and placed in pending just in case corrections need to be made. Other copies have been labeled and placed in bulk scanning. Originals have been placed in an envelope and sent to [place] for the patient or her sister to come pick up in [place] tomorrow.Called and advised to patient. Patient verbalized understanding.

## 2019-12-08 NOTE — Discharge Instructions (Signed)
You were seen today for an allergic reaction. You are improved enough to go home. Please take a daily allergy pill over the counter such as zyrtec or claritin. Always keep an Epipen with you. Thank you for allowing me to care for you today. Please return to the emergency department if you have new or worsening symptoms. Take your medications as instructed.

## 2019-12-11 DIAGNOSIS — R69 Illness, unspecified: Secondary | ICD-10-CM | POA: Diagnosis not present

## 2019-12-12 ENCOUNTER — Encounter: Payer: Self-pay | Admitting: Emergency Medicine

## 2019-12-12 ENCOUNTER — Ambulatory Visit
Admission: EM | Admit: 2019-12-12 | Discharge: 2019-12-12 | Disposition: A | Discharge status: Home / Self Care | Payer: Medicaid Other | Attending: Emergency Medicine | Admitting: Emergency Medicine

## 2019-12-12 ENCOUNTER — Other Ambulatory Visit: Payer: Self-pay

## 2019-12-12 DIAGNOSIS — R5383 Other fatigue: Secondary | ICD-10-CM | POA: Diagnosis not present

## 2019-12-12 LAB — POCT URINALYSIS DIP (MANUAL ENTRY)
Bilirubin, UA: NEGATIVE
Glucose, UA: NEGATIVE mg/dL
Ketones, POC UA: NEGATIVE mg/dL
Leukocytes, UA: NEGATIVE
Nitrite, UA: NEGATIVE
Protein Ur, POC: NEGATIVE mg/dL
Spec Grav, UA: 1.02 (ref 1.010–1.025)
Urobilinogen, UA: 0.2 E.U./dL
pH, UA: 6.5 (ref 5.0–8.0)

## 2019-12-12 LAB — POCT FASTING CBG KUC MANUAL ENTRY: POCT Glucose (KUC): 112 mg/dL — AB (ref 70–99)

## 2019-12-12 NOTE — ED Provider Notes (Signed)
[facility]    CSN: [id number] Arrival date & time: 12/12/19  1406      History   Chief Complaint Chief Complaint  Patient presents with  . Fatigue    HPI Jaime Flores is a 34 y.o. female.   Who presents to the urgent care with a complaint of fatigue for the past 3 days.  Denies any precipitating event.  She called ambulance last night for the same symptoms but was no transfer to ED.  Her blood sugar was checked at home and it was normal.  Reports she is currently taking multivitamin, Zyrtec and and Celexa.  Nothing made her symptoms worse.  She denies similar symptoms in the past.  Denies chills, fever, nausea, vomiting, diarrhea, confusion, diplopia, chest pain, chest tightness, shortness of breath.   The history is provided by the patient. No language interpreter was used.    Past Medical History:  Diagnosis Date  . Amenorrhea 01/27/2013  . Angio-edema   . Breast pain 03/10/2015  . Encounter for menstrual regulation 07/05/2015  . Irregular intermenstrual bleeding 07/28/2015  . Irregular periods 07/05/2015  . Screening for STD (sexually transmitted disease) 08/07/2013  . Urticaria   . UTI (urinary tract infection)   . Vaginal discharge 06/10/2013    Patient Active Problem List   Diagnosis Date Noted  . Anxiety 11/18/2019  . Depression, recurrent ([place]) 11/18/2019  . Encounter for gynecological examination with Papanicolaou smear of cervix 06/03/2019  . 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 Cough 05/15/2019  . Seasonal and perennial allergic rhinitis 05/15/2019  . Bug bite 02/06/2019  . Hemorrhoids 09/26/2016  . Irregular intermenstrual bleeding 07/28/2015  . Amenorrhea 01/27/2013    Past Surgical History:  Procedure Laterality Date  . APPENDECTOMY    . CHOLECYSTECTOMY    . TUBAL LIGATION      OB History    Gravida  2   Para  2   Term  1   Preterm  1   AB      Living  2     SAB      TAB      Ectopic        Multiple      Live Births               Home Medications    Prior to Admission medications   Medication Sig Start Date End Date Taking? Authorizing Provider  albuterol (VENTOLIN HFA) 108 (90 Base) MCG/ACT inhaler Use 4 puffs every 4-6 hours as needed for cough or wheeze. 05/15/19   Valentina Shaggy, MD  ARIPiprazole (ABILIFY) 5 MG tablet Take 1 tablet (5 mg total) by mouth daily. 11/18/19   [name], Fransisca Kaufmann, MD  benzonatate (TESSALON) 100 MG capsule Take 1 capsule (100 mg total) by mouth every 8 (eight) hours. Patient not taking: Reported on 12/12/2019 11/30/19   Wurst, [place], PA-C  cetirizine (ZYRTEC) 10 MG tablet Take 1 tablet (10 mg total) by mouth daily. 11/13/19 12/13/19  Valentina Shaggy, MD  citalopram (CELEXA) 40 MG tablet TAKE 1 TABLET DAILY 12/01/19   [name], Fransisca Kaufmann, MD  cyclobenzaprine (FLEXERIL) 5 MG tablet Take 1 tablet (5 mg total) by mouth 3 (three) times daily as needed for muscle spasms. Patient not taking: Reported on 12/12/2019 10/08/19   Chevis Pretty, FNP  diphenhydrAMINE (BENADRYL) 25 MG tablet Take 25 mg by mouth every 6 (six) hours as needed.    [provider]  EPINEPHrine  0.3 mg/0.3 mL IJ SOAJ injection Inject 0.3 mLs (0.3 mg total) into the muscle as needed for anaphylaxis. 11/13/19   Valentina Shaggy, MD  fluticasone [facility]) 50 MCG/ACT nasal spray Place 2 sprays into both nostrils daily. 11/30/19   Wurst, [place], PA-C  ibuprofen (ADVIL) 800 MG tablet Take 1 tablet (800 mg total) by mouth 3 (three) times daily. Take with food 09/24/19   Yuvaan Olander, Darrelyn Hillock, FNP  megestrol (MEGACE) 40 MG tablet Take 1 tablet (40 mg total) by mouth daily. Patient taking differently: Take 40 mg by mouth daily as needed.  01/02/18   Estill Dooms, NP  Multiple Vitamins-Calcium (ONE-A-DAY WOMENS FORMULA PO) Take by mouth.    [provider]  mupirocin ointment (BACTROBAN) 2 % Apply 1 application topically 2 (two) times daily.  11/30/19   Lestine Box, PA-C    Family History Family History  Problem Relation Age of Onset  . Autism Son   . Diabetes Maternal Grandmother   . Stroke Maternal Grandmother   . Heart failure Maternal Grandmother   . Stroke Maternal Grandfather   . Stroke Father   . Seizures Father   . Allergies Father        penicillin  . Other Mother        herpes  . Heart failure Paternal Grandfather   . Diabetes Paternal Grandmother   . Hypertension Paternal Grandmother   . Autism Brother   . Other Paternal Uncle        heart exploded  . Cancer Maternal Aunt   . Diabetes Maternal Aunt   . Hypertension Maternal Aunt   . Immunodeficiency Neg Hx   . Urticaria Neg Hx   . Eczema Neg Hx   . Atopy Neg Hx   . Asthma Neg Hx   . Angioedema Neg Hx   . Allergic rhinitis Neg Hx     Social History Social History   Tobacco Use  . Smoking status: Current Every Day Smoker    Packs/day: 0.50    Types: Cigarettes  . Smokeless tobacco: Never Used  Substance Use Topics  . Alcohol use: No  . Drug use: No     Allergies   Doxycycline, Wellbutrin [bupropion], and Tape   Review of Systems Review of Systems  Constitutional: Positive for fatigue.  HENT: Negative.   Respiratory: Negative.   Cardiovascular: Negative.   Gastrointestinal: Negative.   Neurological: Negative.   All other systems reviewed and are negative.    Physical Exam Triage Vital Signs ED Triage Vitals [12/12/19 1413]  Enc Vitals Group     BP 117/74     Pulse Rate 80     Resp 16     Temp 98.3 F (36.8 C)     Temp Source Oral     SpO2 97 %     Weight      Height      Head Circumference      Peak Flow      Pain Score      Pain Loc      Pain Edu?      Excl. in [place]?    No data found.  Updated Vital Signs BP 117/74 (BP Location: Right Arm)   Pulse 80   Temp 98.3 F (36.8 C) (Oral)   Resp 16   SpO2 97%   Visual Acuity Right Eye Distance:   Left Eye Distance:   Bilateral Distance:    Right Eye  Near:   Left Eye Near:  Bilateral Near:     Physical Exam Vitals and nursing note reviewed.  Constitutional:      General: She is not in acute distress.    Appearance: Normal appearance. She is normal weight. She is not ill-appearing or toxic-appearing.  HENT:     Head: Normocephalic.     Right Ear: Tympanic membrane, ear canal and external ear normal. There is no impacted cerumen.     Left Ear: Tympanic membrane, ear canal and external ear normal. There is no impacted cerumen.     Nose: Nose normal. No congestion.  Cardiovascular:     Rate and Rhythm: Normal rate and regular rhythm.     Pulses: Normal pulses.     Heart sounds: Normal heart sounds. No murmur.  Pulmonary:     Effort: Pulmonary effort is normal. No respiratory distress.     Breath sounds: Normal breath sounds. No wheezing or rhonchi.  Chest:     Chest wall: No tenderness.  Musculoskeletal:     Cervical back: Normal range of motion.  Neurological:     Mental Status: She is alert and oriented to person, place, and time.     Cranial Nerves: No cranial nerve deficit.     Sensory: No sensory deficit.     Motor: No weakness.     Coordination: Coordination normal.      UC Treatments / Results  Labs (all labs ordered are listed, but only abnormal results are displayed) Labs Reviewed  POCT URINALYSIS DIP (MANUAL ENTRY) - Abnormal; Notable for the following components:      Result Value   Blood, UA small (*)    All other components within normal limits  POCT FASTING CBG [facility] MANUAL ENTRY - Abnormal; Notable for the following components:   POCT Glucose ([facility]) 112 (*)    All other components within normal limits    EKG   Radiology No results found.  Procedures Procedures (including critical care time)  Medications Ordered in UC Medications - No data to display  Initial Impression / Assessment and Plan / UC Course  I have reviewed the triage vital signs and the nursing notes.  Pertinent labs & imaging  results that were available during my care of the patient were reviewed by me and considered in my medical decision making (see chart for details).    Patient is stable at discharge.  POCT urine analysis was inconclusive for UTI.  CBC :112.  Neuro assessment is otherwise normal.  She states she will take Monday off to go see her PCP to have CBC, CMP, A1c and TSH completed.  Final Clinical Impressions(s) / UC Diagnoses   Final diagnoses:  Fatigue, unspecified type     Discharge Instructions     Advised patient to follow-up with PCP to have labs completed Work note was given Return or go to ED for worsening of symptoms     ED Prescriptions    None     PDMP not reviewed this encounter.   Emerson Monte, [place] 12/12/19 1454

## 2019-12-12 NOTE — ED Triage Notes (Signed)
Pt here for fatigue x 3 days; denies other sx at present

## 2019-12-12 NOTE — Discharge Instructions (Addendum)
Advised patient to follow-up with PCP to have labs completed Work note was given Return or go to ED for worsening of symptoms

## 2019-12-14 ENCOUNTER — Telehealth (INDEPENDENT_AMBULATORY_CARE_PROVIDER_SITE_OTHER): Payer: Medicaid Other | Admitting: Family Medicine

## 2019-12-14 ENCOUNTER — Other Ambulatory Visit: Payer: Self-pay

## 2019-12-14 ENCOUNTER — Encounter: Payer: Self-pay | Admitting: Family Medicine

## 2019-12-14 DIAGNOSIS — R251 Tremor, unspecified: Secondary | ICD-10-CM

## 2019-12-14 DIAGNOSIS — F339 Major depressive disorder, recurrent, unspecified: Secondary | ICD-10-CM | POA: Diagnosis not present

## 2019-12-14 DIAGNOSIS — F419 Anxiety disorder, unspecified: Secondary | ICD-10-CM

## 2019-12-14 MED ORDER — CITALOPRAM HYDROBROMIDE 40 MG PO TABS: 40.0000 mg | ORAL_TABLET | Freq: Every day | ORAL | 1 refills | Status: DC

## 2019-12-14 MED ORDER — ARIPIPRAZOLE 5 MG PO TABS: 5.0000 mg | ORAL_TABLET | Freq: Every day | ORAL | 1 refills | Status: DC

## 2019-12-14 NOTE — Progress Notes (Signed)
Virtual Visit via telephone Note  I connected with Jaime Flores on [birth year] at 1005 by telephone and verified that I am speaking with the correct person using two identifiers. Jaime Flores is currently located at car and no other people are currently with her during visit. The provider, Fransisca Kaufmann [name], MD is located in their office at time of visit.  Call ended at 1020  I discussed the limitations, risks, security and privacy concerns of performing an evaluation and management service by telephone and the availability of in person appointments. I also discussed with the patient that there may be a patient responsible charge related to this service. The patient expressed understanding and agreed to proceed.   History and Present Illness: Anxiety recheck Patient is taking abilify and citalopram.  She has occasional passing thoughts of wanted to be dead and abilify is working well and denies depression. She had episodes of weak spells and blood sugar 45. She is trying to increase appetite.   No diagnosis found.  Outpatient Encounter Medications as of 12/14/2019  Medication Sig  . albuterol (VENTOLIN HFA) 108 (90 Base) MCG/ACT inhaler Use 4 puffs every 4-6 hours as needed for cough or wheeze.  [name] ARIPiprazole (ABILIFY) 5 MG tablet Take 1 tablet (5 mg total) by mouth daily.  . benzonatate (TESSALON) 100 MG capsule Take 1 capsule (100 mg total) by mouth every 8 (eight) hours. (Patient not taking: Reported on 12/12/2019)  . cetirizine (ZYRTEC) 10 MG tablet Take 1 tablet (10 mg total) by mouth daily.  . citalopram (CELEXA) 40 MG tablet TAKE 1 TABLET DAILY  . cyclobenzaprine (FLEXERIL) 5 MG tablet Take 1 tablet (5 mg total) by mouth 3 (three) times daily as needed for muscle spasms. (Patient not taking: Reported on 12/12/2019)  . diphenhydrAMINE (BENADRYL) 25 MG tablet Take 25 mg by mouth every 6 (six) hours as needed.  [name] EPINEPHrine 0.3 mg/0.3 mL IJ SOAJ injection Inject 0.3 mLs (0.3 mg  total) into the muscle as needed for anaphylaxis.  . fluticasone (FLONASE) 50 MCG/ACT nasal spray Place 2 sprays into both nostrils daily.  [name] ibuprofen (ADVIL) 800 MG tablet Take 1 tablet (800 mg total) by mouth 3 (three) times daily. Take with food  . megestrol (MEGACE) 40 MG tablet Take 1 tablet (40 mg total) by mouth daily. (Patient taking differently: Take 40 mg by mouth daily as needed. )  . Multiple Vitamins-Calcium (ONE-A-DAY WOMENS FORMULA PO) Take by mouth.  . mupirocin ointment (BACTROBAN) 2 % Apply 1 application topically 2 (two) times daily.   No facility-administered encounter medications on file as of 12/14/2019.    Review of Systems  Constitutional: Negative for chills and fever.  Eyes: Negative for visual disturbance.  Respiratory: Negative for chest tightness and shortness of breath.   Cardiovascular: Negative for chest pain and leg swelling.  Gastrointestinal: Negative for abdominal pain.  Musculoskeletal: Negative for back pain and gait problem.  Skin: Negative for rash.  Neurological: Positive for weakness (episodic). Negative for dizziness, light-headedness and headaches.  Psychiatric/Behavioral: Negative for agitation and behavioral problems.  All other systems reviewed and are negative.   Observations/Objective: Patient sounds comfortable and in no acute distress  Assessment and Plan: Problem List Items Addressed This Visit      Other   Anxiety - Primary   Relevant Medications   ARIPiprazole (ABILIFY) 5 MG tablet   citalopram (CELEXA) 40 MG tablet   Other Relevant Orders   CBC with Differential/Platelet   CMP14+EGFR  VITAMIN D 25 Hydroxy (Vit-D Deficiency, Fractures)   Depression, recurrent (HCC)   Relevant Medications   ARIPiprazole (ABILIFY) 5 MG tablet   citalopram (CELEXA) 40 MG tablet    Other Visit Diagnoses    Shakiness       Relevant Orders   TSH      Continue current medication, patient appears to be doing well except for the shakiness  and some hypoglycemic episodes with her appetite being down, she is focused on none increase in her appetite and will let me know if it continues to be an issue. Follow up plan: Return in about 4 weeks (around 01/11/2020), or if symptoms worsen or fail to improve, for anxiety and depression.     I discussed the assessment and treatment plan with the patient. The patient was provided an opportunity to ask questions and all were answered. The patient agreed with the plan and demonstrated an understanding of the instructions.   The patient was advised to call back or seek an in-person evaluation if the symptoms worsen or if the condition fails to improve as anticipated.  The above assessment and management plan was discussed with the patient. The patient verbalized understanding of and has agreed to the management plan. Patient is aware to call the clinic if symptoms persist or worsen. Patient is aware when to return to the clinic for a follow-up visit. Patient educated on when it is appropriate to go to the emergency department.    I provided 15 minutes of non-face-to-face time during this encounter.    [name], MD

## 2019-12-15 LAB — CBC WITH DIFFERENTIAL/PLATELET
Basophils Absolute: 0.1 10*3/uL (ref 0.0–0.2)
Basos: 1 %
EOS (ABSOLUTE): 0.2 10*3/uL (ref 0.0–0.4)
Eos: 2 %
Hematocrit: 40.5 % (ref 34.0–46.6)
Hemoglobin: 12.9 g/dL (ref 11.1–15.9)
Immature Grans (Abs): 0 10*3/uL (ref 0.0–0.1)
Immature Granulocytes: 0 %
Lymphocytes Absolute: 2.4 10*3/uL (ref 0.7–3.1)
Lymphs: 25 %
MCH: 29 pg (ref 26.6–33.0)
MCHC: 31.9 g/dL (ref 31.5–35.7)
MCV: 91 fL (ref 79–97)
Monocytes Absolute: 0.5 10*3/uL (ref 0.1–0.9)
Monocytes: 5 %
Neutrophils Absolute: 6.8 10*3/uL (ref 1.4–7.0)
Neutrophils: 67 %
Platelets: 356 10*3/uL (ref 150–450)
RBC: 4.45 x10E6/uL (ref 3.77–5.28)
RDW: 13.4 % (ref 11.7–15.4)
WBC: 9.9 10*3/uL (ref 3.4–10.8)

## 2019-12-15 LAB — TSH: TSH: 2.89 u[IU]/mL (ref 0.450–4.500)

## 2019-12-15 LAB — CMP14+EGFR
ALT: 12 IU/L (ref 0–32)
AST: 17 IU/L (ref 0–40)
Albumin/Globulin Ratio: 1.7 (ref 1.2–2.2)
Albumin: 4.1 g/dL (ref 3.8–4.8)
Alkaline Phosphatase: 117 IU/L (ref 48–121)
BUN/Creatinine Ratio: 6 — ABNORMAL LOW (ref 9–23)
BUN: 4 mg/dL — ABNORMAL LOW (ref 6–20)
Bilirubin Total: 0.2 mg/dL (ref 0.0–1.2)
CO2: 24 mmol/L (ref 20–29)
Calcium: 9.3 mg/dL (ref 8.7–10.2)
Chloride: 102 mmol/L (ref 96–106)
Creatinine, Ser: 0.67 mg/dL (ref 0.57–1.00)
GFR calc Af Amer: 135 mL/min/{1.73_m2} (ref >59)
GFR calc non Af Amer: 118 mL/min/{1.73_m2} (ref >59)
Globulin, Total: 2.4 g/dL (ref 1.5–4.5)
Glucose: 117 mg/dL — ABNORMAL HIGH (ref 65–99)
Potassium: 3.5 mmol/L (ref 3.5–5.2)
Sodium: 140 mmol/L (ref 134–144)
Total Protein: 6.5 g/dL (ref 6.0–8.5)

## 2019-12-15 LAB — VITAMIN D 25 HYDROXY (VIT D DEFICIENCY, FRACTURES): Vit D, 25-Hydroxy: 35.1 ng/mL (ref 30.0–100.0)

## 2019-12-16 ENCOUNTER — Ambulatory Visit (INDEPENDENT_AMBULATORY_CARE_PROVIDER_SITE_OTHER): Payer: Medicaid Other

## 2019-12-16 ENCOUNTER — Other Ambulatory Visit: Payer: Self-pay

## 2019-12-16 DIAGNOSIS — J309 Allergic rhinitis, unspecified: Secondary | ICD-10-CM | POA: Diagnosis not present

## 2019-12-16 NOTE — Progress Notes (Signed)
Immunotherapy   Patient Details  Name: Jaime Flores MRN: [id number] Date of Birth: [birth year]  [name] started injections today she waited 30 minutes and had a coughing spell and took 2 puffs of her albuterol, her vitals were normal she was given 10mg  of cetirizine waited another 15 minutes and was discharged.  Following schedule: A   Frequency:Weekly  Epi-Pen:Yes   Consent signed and patient instructions given.   Isabel Caprice 12/16/2019, 11:21 AM

## 2019-12-17 ENCOUNTER — Other Ambulatory Visit: Payer: Self-pay

## 2019-12-17 DIAGNOSIS — R059 Cough, unspecified: Secondary | ICD-10-CM

## 2019-12-21 ENCOUNTER — Ambulatory Visit
Admission: EM | Admit: 2019-12-21 | Discharge: 2019-12-21 | Disposition: A | Discharge status: Home / Self Care | Payer: Medicaid Other | Attending: Emergency Medicine | Admitting: Emergency Medicine

## 2019-12-21 ENCOUNTER — Encounter: Payer: Self-pay | Admitting: Family Medicine

## 2019-12-21 DIAGNOSIS — E162 Hypoglycemia, unspecified: Secondary | ICD-10-CM

## 2019-12-21 DIAGNOSIS — R5383 Other fatigue: Secondary | ICD-10-CM

## 2019-12-21 LAB — POCT FASTING CBG KUC MANUAL ENTRY: POCT Glucose (KUC): 138 mg/dL — AB (ref 70–99)

## 2019-12-21 NOTE — ED Triage Notes (Signed)
Pt states she has been checking CBG for past 3 weeks because of fatigue and states it was 63 before arrival. Checked upon assessment and is 138

## 2019-12-21 NOTE — ED Provider Notes (Signed)
[place]   [id number] 12/21/19 Arrival Time: 1942  CC: Low Blood sugar  SUBJECTIVE: History from: patient.  Jaime Flores is a 34 y.o. female with no known history of diabetes who presents to the urgent care for complaint of low blood sugar for the past 3 weeks.  Reports CBG at home was 63.  138 in office.  Denies precipitating event.  Has not used any OTC medication.  Denies aggravating or alleviating factors.  Reports similar symptoms in the past.   Denies dizziness, fainting, night sweats, decreased appetite, decreased activity, otalgia, drooling, vomiting, cough, wheezing, rash, strong urine odor, dark colored urine, changes in bowel or bladder function.     Immunization History  Administered Date(s) Administered  . Influenza-Unspecified 02/15/2018  . PPD Test 12/09/2017  . Tdap 12/09/2017    Received flu shot this year: yes.  ROS: As per HPI.  All other pertinent ROS negative.     Past Medical History:  Diagnosis Date  . Amenorrhea 01/27/2013  . Angio-edema   . Breast pain 03/10/2015  . Encounter for menstrual regulation 07/05/2015  . Irregular intermenstrual bleeding 07/28/2015  . Irregular periods 07/05/2015  . Screening for STD (sexually transmitted disease) 08/07/2013  . Urticaria   . UTI (urinary tract infection)   . Vaginal discharge 06/10/2013   Past Surgical History:  Procedure Laterality Date  . APPENDECTOMY    . CHOLECYSTECTOMY    . TUBAL LIGATION     Allergies  Allergen Reactions  . Doxycycline Shortness Of Breath  . Wellbutrin [Bupropion] Shortness Of Breath, Nausea Only and Palpitations  . Tape Swelling    Plastic tape please use paper   No current facility-administered medications on file prior to encounter.   Current Outpatient Medications on File Prior to Encounter  Medication Sig Dispense Refill  . albuterol (VENTOLIN HFA) 108 (90 Base) MCG/ACT inhaler Use 4 puffs every 4-6 hours as needed for cough or wheeze. 18 g 2  .  ARIPiprazole (ABILIFY) 5 MG tablet Take 1 tablet (5 mg total) by mouth daily. 90 tablet 1  . cetirizine (ZYRTEC) 10 MG tablet Take 1 tablet (10 mg total) by mouth daily. 30 tablet 5  . citalopram (CELEXA) 40 MG tablet Take 1 tablet (40 mg total) by mouth daily. 90 tablet 1  . cyclobenzaprine (FLEXERIL) 5 MG tablet Take 1 tablet (5 mg total) by mouth 3 (three) times daily as needed for muscle spasms. (Patient not taking: Reported on 12/12/2019) 30 tablet 1  . diphenhydrAMINE (BENADRYL) 25 MG tablet Take 25 mg by mouth every 6 (six) hours as needed.    [name] EPINEPHrine 0.3 mg/0.3 mL IJ SOAJ injection Inject 0.3 mLs (0.3 mg total) into the muscle as needed for anaphylaxis. 2 each 2  . fluticasone (FLONASE) 50 MCG/ACT nasal spray Place 2 sprays into both nostrils daily. 16 g 0  . ibuprofen (ADVIL) 800 MG tablet Take 1 tablet (800 mg total) by mouth 3 (three) times daily. Take with food 60 tablet 0  . Multiple Vitamins-Calcium (ONE-A-DAY WOMENS FORMULA PO) Take by mouth.    . mupirocin ointment (BACTROBAN) 2 % Apply 1 application topically 2 (two) times daily. 30 g 1   Social History   Socioeconomic History  . Marital status: Single    Spouse name: Not on file  . Number of children: 2  . Years of education: Not on file  . Highest education level: Not on file  Occupational History  . Not on file  Tobacco Use  .  Smoking status: Current Every Day Smoker    Packs/day: 0.50    Types: Cigarettes  . Smokeless tobacco: Never Used  Substance and Sexual Activity  . Alcohol use: No  . Drug use: No  . Sexual activity: Yes    Birth control/protection: Surgical    Comment: tubal  Other Topics Concern  . Not on file  Social History Narrative  . Not on file   Social Determinants of Health   Financial Resource Strain:   . Difficulty of Paying Living Expenses:   Food Insecurity:   . Worried About Charity fundraiser in the Last Year:   . Arboriculturist in the Last Year:   Transportation Needs:   .  Film/video editor (Medical):   [name] Lack of Transportation (Non-Medical):   Physical Activity:   . Days of Exercise per Week:   . Minutes of Exercise per Session:   Stress:   . Feeling of Stress :   Social Connections:   . Frequency of Communication with Friends and Family:   . Frequency of Social Gatherings with Friends and Family:   . Attends Religious Services:   . Active Member of Clubs or Organizations:   . Attends Archivist Meetings:   [name] Marital Status:   Intimate Partner Violence:   . Fear of Current or Ex-Partner:   . Emotionally Abused:   [name] Physically Abused:   . Sexually Abused:    Family History  Problem Relation Age of Onset  . Autism Son   . Diabetes Maternal Grandmother   . Stroke Maternal Grandmother   . Heart failure Maternal Grandmother   . Stroke Maternal Grandfather   . Stroke Father   . Seizures Father   . Allergies Father        penicillin  . Other Mother        herpes  . Heart failure Paternal Grandfather   . Diabetes Paternal Grandmother   . Hypertension Paternal Grandmother   . Autism Brother   . Other Paternal Uncle        heart exploded  . Cancer Maternal Aunt   . Diabetes Maternal Aunt   . Hypertension Maternal Aunt   . Immunodeficiency Neg Hx   . Urticaria Neg Hx   . Eczema Neg Hx   . Atopy Neg Hx   . Asthma Neg Hx   . Angioedema Neg Hx   . Allergic rhinitis Neg Hx     OBJECTIVE:  Vitals:   12/21/19 1950  BP: 114/86  Pulse: 86  Resp: 20  Temp: 98.4 F (36.9 C)  SpO2: 97%     General appearance: alert; smiling and laughing during encounter; nontoxic appearance HEENT: NCAT; Ears: EACs clear, TMs pearly gray; Eyes: EOM grossly intact. Nose: no rhinorrhea without nasal flaring; Throat: oropharynx clear, tonsils not enlarged or erythematous, uvula midline Neck: supple without LAD Lungs: CTA bilaterally without adventitious breath sounds; normal respiratory effort, no belly breathing or accessory muscle use; no cough  present Heart: regular rate and rhythm.  Radial pulses 2+ symmetrical bilaterally Abdomen: soft; normal active bowel sounds; nontender to palpation Skin: warm and dry; no obvious rashes Psychological: alert and cooperative; normal mood and affect appropriate for age   ASSESSMENT & PLAN:  1. Fatigue, unspecified type    Patient is stable at discharge.  Patient labs completed on 12/14/2019 by PCP was reviewed.  CBC, CMP, vitamin D were otherwise normal.  Other labs completed previously were also reviewed with  TSH within normal limit.  There is no known history of diabetes.  Blood sugar was checked in office using patient's machine as well as the office machine.  Patient's CBG machine showed 80 while the office machine showed 138.  There is a difference of 58 between patient's CBG machine and they one use in office.  This suggested the patient CBG machine is not accurate and need to be re-calibrated.  She was made aware and is in agreement.  She was advised to follow up with PCP  To return for worsening of symptoms.  No orders of the defined types were placed in this encounter.  Discharge Instruction Encourage fluid intake Eat a balance healthy diet  Do not skip meals  follow up with PCP Return or go to the ED for any new or worsening symptoms Reviewed expectations re: course of current medical issues. Questions answered. Outlined signs and symptoms indicating need for more acute intervention. Patient verbalized understanding. After Visit Summary given.          Emerson Monte, [place] 12/22/19 [phone]

## 2019-12-21 NOTE — Discharge Instructions (Addendum)
Encourage fluid intake Eat a healthy diet  Do not skip meals  follow up with PCP Return or go to the ED for any new or worsening symptoms

## 2019-12-22 ENCOUNTER — Telehealth: Payer: Self-pay

## 2019-12-22 NOTE — Telephone Encounter (Signed)
Patient called requesting a note for her missing work today. Patient went to our [place] office for a shot today.  She thought we were open because [employer] says we are open on Tuesdays. Patient had told her boss that she wouldn't be back to work because of the reaction she had last time she got her allergy shot.  Does the patient have FMLA with her job?  Thanks

## 2019-12-23 ENCOUNTER — Ambulatory Visit: Payer: Self-pay

## 2019-12-23 ENCOUNTER — Encounter: Payer: Self-pay | Admitting: Allergy & Immunology

## 2019-12-23 ENCOUNTER — Ambulatory Visit (INDEPENDENT_AMBULATORY_CARE_PROVIDER_SITE_OTHER): Payer: Medicaid Other | Admitting: Allergy & Immunology

## 2019-12-23 ENCOUNTER — Other Ambulatory Visit: Payer: Self-pay

## 2019-12-23 VITALS — BP 116/64 | HR 60 | Resp 18

## 2019-12-23 DIAGNOSIS — J302 Other seasonal allergic rhinitis: Secondary | ICD-10-CM

## 2019-12-23 DIAGNOSIS — J3089 Other allergic rhinitis: Secondary | ICD-10-CM | POA: Diagnosis not present

## 2019-12-23 DIAGNOSIS — T7800XD Anaphylactic reaction due to unspecified food, subsequent encounter: Secondary | ICD-10-CM | POA: Diagnosis not present

## 2019-12-23 DIAGNOSIS — J309 Allergic rhinitis, unspecified: Secondary | ICD-10-CM

## 2019-12-23 DIAGNOSIS — R059 Cough, unspecified: Secondary | ICD-10-CM

## 2019-12-23 DIAGNOSIS — R05 Cough: Secondary | ICD-10-CM

## 2019-12-23 DIAGNOSIS — T7800XA Anaphylactic reaction due to unspecified food, initial encounter: Secondary | ICD-10-CM

## 2019-12-23 NOTE — Progress Notes (Signed)
FOLLOW UP  Date of Service/Encounter:  12/23/19   Assessment:   Cough  - responsive to albuterol  ? VCD - referring to Dr. Rowe Clack  Seasonal and perennial allergic rhinitis (grass, ragweed, tree, indoor/outdoor molds, cat, cockroach) - recently initiated allergen immunotherapy  Anaphylaxis due to food  Plan/Recommendations:   1. Allergic reactions - ? vocal cord dysfunction - We are going to hold your allergy shots for now. - I think you are experiencing vocal cord dysfunction (feels like asthma, but it is because your vocal cords are open when they are supposed to be closed and vice versa). - We will work on getting this referral in place to see Dr. Rowe Clack at [facility].  - They should call within one week. - We can certainly restart allergy shots once this evaluation is over, but I do not want to be treating you for the wrong diagnosis.    2. Return in about 4 weeks (around 01/20/2020). This can be an in-person, a virtual Webex or a telephone follow up visit.   Subjective:   Jaime Flores is a 34 y.o. female presenting today for follow up of No chief complaint on file.   Jaime Flores has a history of the following: Patient Active Problem List   Diagnosis Date Noted  . Anxiety 11/18/2019  . Depression, recurrent ([place]) 11/18/2019  . Encounter for gynecological examination with Papanicolaou smear of cervix 06/03/2019  . 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 Cough 05/15/2019  . Seasonal and perennial allergic rhinitis 05/15/2019  . Bug bite 02/06/2019  . Hemorrhoids 09/26/2016  . Irregular intermenstrual bleeding 07/28/2015  . Amenorrhea 01/27/2013    History obtained from: chart review and patient.  Jaime Flores is a 34 y.o. female presenting for allergy shot "reaction".  Her last regular office visit was in May 2021.  At that time, she had recently experienced one of her allergic reactions.  We started her on a prednisone taper  and continue with Zyrtec 3 times daily.  We also filled out FMLA paperwork for her to receive allergy shots.  In the interim, she did start her allergy shots last week.  She got 0.05 mL of the [facility].  She was fine until around 29 minutes after her injection when she started coughing quite a bit in the lobby and using her rescue inhaler.  We brought her into her room and did a spirometry, which was normal.  Her vitals were also normal.  We gave her a dose of cetirizine and wanted to monitor for 30 minutes, but she insisted on leaving her right away.  Today, she came in for another injection.  We repeated the 0.05 mL of the Blue Vial.  Within 15 minutes of her injection, she started coughing again and we took her into her room.  She does tell me today that she did not take her cetirizine last night as she normally does.  She did use her rescue inhaler in the lobby with improvement of the symptoms.  She thinks that her allergy shots are helping quite a bit and she has not had an allergic reaction until today.  She does have a history of anxiety and she tells me that her pills "make [her] tolerable". She has never been diagnosed with VCD, but she is open to the diagnosis after we discussed it with her.  She has never seen an otolaryngologist.  She does report that her "allergic reactions" occur mostly on  the bus.  She does wear a mask on the last although it is not an airtight mask. She is not excited about the prospect of using an N95 mask on the bus since she has a problem with claustrophobia.  Otherwise, there have been no changes to her past medical history, surgical history, family history, or social history.    Review of Systems  Constitutional: Negative.  Negative for chills, fever, malaise/fatigue and weight loss.  HENT: Positive for congestion and sinus pain. Negative for ear discharge and ear pain.   Eyes: Negative for pain, discharge and redness.  Respiratory: Positive for cough. Negative  for sputum production, shortness of breath and wheezing.   Cardiovascular: Negative.  Negative for chest pain and palpitations.  Gastrointestinal: Negative for abdominal pain, constipation, diarrhea, heartburn, nausea and vomiting.  Skin: Negative.  Negative for itching and rash.  Neurological: Negative for dizziness and headaches.  Endo/Heme/Allergies: Positive for environmental allergies. Does not bruise/bleed easily.       Objective:   Blood pressure 116/64, pulse 60, resp. rate 18, SpO2 98 %. There is no height or weight on file to calculate BMI.   Physical Exam:  Physical Exam  Constitutional: She appears well-developed.  Anxious. Talkative.   HENT:  Head: Normocephalic and atraumatic.  Right Ear: Tympanic membrane, external ear and ear canal normal.  Left Ear: Tympanic membrane, external ear and ear canal normal.  Nose: Mucosal edema and rhinorrhea present. No nasal deformity or septal deviation. No epistaxis. Right sinus exhibits no maxillary sinus tenderness and no frontal sinus tenderness. Left sinus exhibits no maxillary sinus tenderness and no frontal sinus tenderness.  Mouth/Throat: Uvula is midline and oropharynx is clear and moist. Mucous membranes are not pale and not dry.  Eyes: Pupils are equal, round, and reactive to light. Conjunctivae and EOM are normal. Right eye exhibits no chemosis and no discharge. Left eye exhibits no chemosis and no discharge. Right conjunctiva is not injected. Left conjunctiva is not injected.  Cardiovascular: Normal rate, regular rhythm and normal heart sounds.  Respiratory: Effort normal and breath sounds normal. No accessory muscle usage. No tachypnea. No respiratory distress. She has no wheezes. She has no rhonchi. She has no rales. She exhibits no tenderness.  Moving air well in all lung fields. She is coughing during the visit, but there is no wheezing or crackles noted.   Lymphadenopathy:    She has no cervical adenopathy.    Neurological: She is alert.  Skin: No abrasion, no petechiae and no rash noted. Rash is not papular, not vesicular and not urticarial. No erythema. No pallor.  No eczematous or urticarial lesions noted.  Psychiatric: She has a normal mood and affect.     Diagnostic studies:    Spirometry: results normal (FEV1: 2.55/83%, FVC: 2.96/81%, FEV1/FVC: 77%).    Spirometry consistent with normal pattern.  Flow volume loop appears normal without flattening.     Allergy Studies: none       Salvatore Marvel, MD  [facility]

## 2019-12-23 NOTE — Telephone Encounter (Signed)
I believe we have FMLA filled out on her. That should cover her without a note. Our staff clearly explained the hours to the patient when she started her shots last week.  Salvatore Marvel, MD [facility]

## 2019-12-23 NOTE — Patient Instructions (Addendum)
1. Allergic reactions - ? vocal cord dysfunction - We are going to hold your allergy shots for now. - I think you are experiencing vocal cord dysfunction (feels like asthma, but it is because your vocal cords are open when they are supposed to be closed and vice versa). - We will work on getting this referral in place to see Dr. Rowe Clack at [facility].  - They should call within one week. - We can certainly restart allergy shots once this evaluation is over, but I do not want to be treating you for the wrong diagnosis.    2. Return in about 4 weeks (around 01/20/2020). This can be an in-person, a virtual Webex or a telephone follow up visit.   Please inform us of any Emergency Department visits, hospitalizations, or changes in symptoms. Call us before going to the ED for breathing or allergy symptoms since we might be able to fit you in for a sick visit. Feel free to contact us anytime with any questions, problems, or concerns.  It was a pleasure to see you again today!  Websites that have reliable patient information: 1. American Academy of Asthma, Allergy, and Immunology: www.aaaai.org 2. Food Allergy Research and Education (FARE): foodallergy.org 3. Mothers of Asthmatics: http://www.asthmacommunitynetwork.org 4. American College of Allergy, Asthma, and Immunology: www.acaai.org   COVID-19 Vaccine Information can be found at: ShippingScam.co.uk For questions related to vaccine distribution or appointments, please email [email or web address] or call [phone].     "Like" [place] on Facebook and Instagram for our latest updates!       HAPPY SPRING!  Make sure you are registered to vote! If you have moved or changed any of your contact information, you will need to get this updated before voting!  In some cases, you MAY be able to register to vote online: [email or web address]

## 2019-12-25 ENCOUNTER — Telehealth: Payer: Self-pay | Admitting: Family Medicine

## 2020-01-01 ENCOUNTER — Telehealth: Payer: Self-pay | Admitting: Family Medicine

## 2020-01-01 NOTE — Telephone Encounter (Signed)
Pt wants to talk to nurse or [name] about citalopram (CELEXA) 40 MG tablet not working. Her anxiety is bad today. Please call back

## 2020-01-01 NOTE — Telephone Encounter (Signed)
Please schedule an appointment and we can discuss other options, unfortunately any change we do today would not take effect so please citalopram over the weekend.  Please schedule appointment ASAP so we can talk.  Can be virtual

## 2020-01-07 NOTE — Telephone Encounter (Signed)
NA / NVM 

## 2020-01-13 ENCOUNTER — Telehealth: Payer: Self-pay | Admitting: Allergy & Immunology

## 2020-01-13 NOTE — Telephone Encounter (Signed)
Contacted Dr. Hazle Coca office at [facility] and confirmed patient has an appointment on 01/28/2020 at 130pm. Patient informed and given contact information.

## 2020-01-13 NOTE — Telephone Encounter (Signed)
Patient called and states she has not heard anything about her ENT referral. I see a referral for Dr. Rowe Clack but the referral is closed. Will call ENT office.

## 2020-01-17 ENCOUNTER — Encounter: Payer: Self-pay | Admitting: Family Medicine

## 2020-01-18 ENCOUNTER — Ambulatory Visit (INDEPENDENT_AMBULATORY_CARE_PROVIDER_SITE_OTHER): Payer: Medicaid Other | Admitting: Family Medicine

## 2020-01-18 ENCOUNTER — Encounter: Payer: Self-pay | Admitting: Family Medicine

## 2020-01-18 DIAGNOSIS — F339 Major depressive disorder, recurrent, unspecified: Secondary | ICD-10-CM | POA: Diagnosis not present

## 2020-01-18 DIAGNOSIS — F419 Anxiety disorder, unspecified: Secondary | ICD-10-CM | POA: Diagnosis not present

## 2020-01-18 MED ORDER — ARIPIPRAZOLE 10 MG PO TABS: 10.0000 mg | ORAL_TABLET | Freq: Every day | ORAL | 1 refills | Status: DC

## 2020-01-18 NOTE — Progress Notes (Signed)
Virtual Visit via telephone Note  I connected with Jaime Flores on [birth year] at 1558 by telephone and verified that I am speaking with the correct person using two identifiers. Jaime Flores is currently located at [facility] and no other people are currently with her during visit. The provider, Fransisca Kaufmann Sebasthian Stailey, MD is located in their office at time of visit.  Call ended at 1509  I discussed the limitations, risks, security and privacy concerns of performing an evaluation and management service by telephone and the availability of in person appointments. I also discussed with the patient that there may be a patient responsible charge related to this service. The patient expressed understanding and agreed to proceed.   History and Present Illness: Patient is calling in for recheck on anxiety and depression and citalopram and Abilify.  She says she is doing ok but is still having some thoughts of hurting herself but are passing.  She has no plan to do anything.  She is better but just is distressed about still having. She denies panic attacks and has occasional anxiety.  She has not had that many down days. She is sleeping some at night most nights. She using the occasional trazodone for sleep.   Outpatient Encounter Medications as of 01/18/2020  Medication Sig  . albuterol (VENTOLIN HFA) 108 (90 Base) MCG/ACT inhaler Use 4 puffs every 4-6 hours as needed for cough or wheeze.  [name] ARIPiprazole (ABILIFY) 10 MG tablet Take 1 tablet (10 mg total) by mouth daily.  . cetirizine (ZYRTEC) 10 MG tablet Take 1 tablet (10 mg total) by mouth daily.  . citalopram (CELEXA) 40 MG tablet Take 1 tablet (40 mg total) by mouth daily.  . cyclobenzaprine (FLEXERIL) 5 MG tablet Take 1 tablet (5 mg total) by mouth 3 (three) times daily as needed for muscle spasms. (Patient not taking: Reported on 12/12/2019)  . diphenhydrAMINE (BENADRYL) 25 MG tablet Take 25 mg by mouth every 6 (six) hours as needed.  [name]  EPINEPHrine 0.3 mg/0.3 mL IJ SOAJ injection Inject 0.3 mLs (0.3 mg total) into the muscle as needed for anaphylaxis.  . fluticasone (FLONASE) 50 MCG/ACT nasal spray Place 2 sprays into both nostrils daily.  [name] ibuprofen (ADVIL) 800 MG tablet Take 1 tablet (800 mg total) by mouth 3 (three) times daily. Take with food  . Multiple Vitamins-Calcium (ONE-A-DAY WOMENS FORMULA PO) Take by mouth.  . mupirocin ointment (BACTROBAN) 2 % Apply 1 application topically 2 (two) times daily.  . [DISCONTINUED] ARIPiprazole (ABILIFY) 5 MG tablet Take 1 tablet (5 mg total) by mouth daily.   No facility-administered encounter medications on file as of 01/18/2020.    Review of Systems  Constitutional: Negative for chills and fever.  Eyes: Negative for visual disturbance.  Respiratory: Negative for chest tightness and shortness of breath.   Cardiovascular: Negative for chest pain and leg swelling.  Musculoskeletal: Negative for gait problem.  Skin: Negative for rash.  Neurological: Negative for light-headedness and headaches.  Psychiatric/Behavioral: Positive for dysphoric mood. Negative for agitation, behavioral problems, self-injury, sleep disturbance and suicidal ideas. The patient is nervous/anxious.   All other systems reviewed and are negative.   Observations/Objective: Patient sounds comfortable and in no acute distress.   Assessment and Plan: Problem List Items Addressed This Visit      Other   Anxiety   Relevant Medications   ARIPiprazole (ABILIFY) 10 MG tablet   Depression, recurrent (HCC) - Primary   Relevant Medications   ARIPiprazole (ABILIFY)  10 MG tablet      Increase Abilify and see back in 1 week Follow up plan: Return in about 4 weeks (around 02/15/2020), or if symptoms worsen or fail to improve, for anxiety and depression.     I discussed the assessment and treatment plan with the patient. The patient was provided an opportunity to ask questions and all were answered. The patient  agreed with the plan and demonstrated an understanding of the instructions.   The patient was advised to call back or seek an in-person evaluation if the symptoms worsen or if the condition fails to improve as anticipated.  The above assessment and management plan was discussed with the patient. The patient verbalized understanding of and has agreed to the management plan. Patient is aware to call the clinic if symptoms persist or worsen. Patient is aware when to return to the clinic for a follow-up visit. Patient educated on when it is appropriate to go to the emergency department.    I provided 11 minutes of non-face-to-face time during this encounter.    [name], MD

## 2020-01-19 ENCOUNTER — Telehealth: Payer: Self-pay | Admitting: *Deleted

## 2020-01-19 NOTE — Telephone Encounter (Signed)
PA started on [place] TRACKS for Aripiprazole 10 mg Tab   This is preferred on [facility] med list for 2021. [place]

## 2020-02-15 ENCOUNTER — Other Ambulatory Visit: Payer: Self-pay

## 2020-02-15 ENCOUNTER — Encounter: Payer: Self-pay | Admitting: "Endocrinology

## 2020-02-15 ENCOUNTER — Telehealth: Payer: Self-pay | Admitting: Family Medicine

## 2020-02-15 ENCOUNTER — Ambulatory Visit (INDEPENDENT_AMBULATORY_CARE_PROVIDER_SITE_OTHER): Payer: Medicaid Other | Admitting: "Endocrinology

## 2020-02-15 VITALS — BP 98/56 | HR 76 | Ht 62.5 in | Wt 164.6 lb

## 2020-02-15 DIAGNOSIS — E161 Other hypoglycemia: Secondary | ICD-10-CM | POA: Diagnosis not present

## 2020-02-15 DIAGNOSIS — R7303 Prediabetes: Secondary | ICD-10-CM | POA: Diagnosis not present

## 2020-02-15 LAB — POCT GLYCOSYLATED HEMOGLOBIN (HGB A1C): Hemoglobin A1C: 5.8 % — AB (ref 4.0–5.6)

## 2020-02-15 LAB — GLUCOSE, POCT (MANUAL RESULT ENTRY): POC Glucose: 130 mg/dl — AB (ref 70–99)

## 2020-02-15 MED ORDER — ACCU-CHEK GUIDE VI STRP: ORAL_STRIP | 2 refills | Status: DC

## 2020-02-15 MED ORDER — ACCU-CHEK GUIDE VI STRP
ORAL_STRIP | 2 refills | Status: DC
Start: 2020-02-15 — End: 2020-02-15

## 2020-02-15 MED ORDER — ACCU-CHEK GUIDE W/DEVICE KIT: 1.0000 | PACK | 0 refills | Status: DC

## 2020-02-15 NOTE — Patient Instructions (Signed)

## 2020-02-15 NOTE — Progress Notes (Signed)
Endocrinology Consult Note                                            02/15/2020, 6:18 PM   Subjective:    Patient ID: Jaime Flores, female    DOB: 15-Nov-1985, PCP [name], Fransisca Kaufmann, MD   Past Medical History:  Diagnosis Date  . Amenorrhea 01/27/2013  . Angio-edema   . Breast pain 03/10/2015  . Encounter for menstrual regulation 07/05/2015  . Irregular intermenstrual bleeding 07/28/2015  . Irregular periods 07/05/2015  . Screening for STD (sexually transmitted disease) 08/07/2013  . Urticaria   . UTI (urinary tract infection)   . Vaginal discharge 06/10/2013   Past Surgical History:  Procedure Laterality Date  . APPENDECTOMY    . CHOLECYSTECTOMY    . TUBAL LIGATION     Social History   Socioeconomic History  . Marital status: Single    Spouse name: Not on file  . Number of children: 2  . Years of education: Not on file  . Highest education level: Not on file  Occupational History  . Not on file  Tobacco Use  . Smoking status: Current Every Day Smoker    Packs/day: 0.50    Types: Cigarettes  . Smokeless tobacco: Never Used  Vaping Use  . Vaping Use: Never used  Substance and Sexual Activity  . Alcohol use: No  . Drug use: No  . Sexual activity: Yes    Birth control/protection: Surgical    Comment: tubal  Other Topics Concern  . Not on file  Social History Narrative  . Not on file   Social Determinants of Health   Financial Resource Strain:   . Difficulty of Paying Living Expenses:   Food Insecurity:   . Worried About Charity fundraiser in the Last Year:   . Arboriculturist in the Last Year:   Transportation Needs:   . Film/video editor (Medical):   [name] Lack of Transportation (Non-Medical):   Physical Activity:   . Days of Exercise per Week:   . Minutes of Exercise per Session:   Stress:   . Feeling of Stress :   Social Connections:   . Frequency of Communication with Friends and Family:   . Frequency of Social Gatherings with  Friends and Family:   . Attends Religious Services:   . Active Member of Clubs or Organizations:   . Attends Archivist Meetings:   [name] Marital Status:    Family History  Problem Relation Age of Onset  . Autism Son   . Diabetes Maternal Grandmother   . Stroke Maternal Grandmother   . Heart failure Maternal Grandmother   . Stroke Maternal Grandfather   . Stroke Father   . Seizures Father   . Allergies Father        penicillin  . Heart attack Father   . Other Mother        herpes  . Hyperlipidemia Mother   . Heart failure Paternal Grandfather   . Diabetes Paternal Grandmother   . Hypertension Paternal Grandmother   . Autism Brother   . Other Paternal Uncle        heart exploded  . Cancer Maternal Aunt   . Diabetes Maternal Aunt   . Hypertension Maternal Aunt   . Immunodeficiency Neg Hx   .  Urticaria Neg Hx   . Eczema Neg Hx   . Atopy Neg Hx   . Asthma Neg Hx   . Angioedema Neg Hx   . Allergic rhinitis Neg Hx    Outpatient Encounter Medications as of 02/15/2020  Medication Sig  . albuterol (VENTOLIN HFA) 108 (90 Base) MCG/ACT inhaler Use 4 puffs every 4-6 hours as needed for cough or wheeze.  [name] ARIPiprazole (ABILIFY) 10 MG tablet Take 1 tablet (10 mg total) by mouth daily.  . Blood Glucose Monitoring Suppl (ACCU-CHEK GUIDE) w/Device KIT 1 Piece by Does not apply route as directed.  . cetirizine (ZYRTEC) 10 MG tablet Take 1 tablet (10 mg total) by mouth daily.  . citalopram (CELEXA) 40 MG tablet Take 1 tablet (40 mg total) by mouth daily.  . cyclobenzaprine (FLEXERIL) 5 MG tablet Take 1 tablet (5 mg total) by mouth 3 (three) times daily as needed for muscle spasms. (Patient not taking: Reported on 12/12/2019)  . diphenhydrAMINE (BENADRYL) 25 MG tablet Take 25 mg by mouth every 6 (six) hours as needed.  [name] EPINEPHrine 0.3 mg/0.3 mL IJ SOAJ injection Inject 0.3 mLs (0.3 mg total) into the muscle as needed for anaphylaxis.  . fluticasone (FLONASE) 50 MCG/ACT nasal spray  Place 2 sprays into both nostrils daily.  [name] ibuprofen (ADVIL) 800 MG tablet Take 1 tablet (800 mg total) by mouth 3 (three) times daily. Take with food  . Multiple Vitamins-Calcium (ONE-A-DAY WOMENS FORMULA PO) Take by mouth.  . [DISCONTINUED] glucose blood (ACCU-CHEK GUIDE) test strip Use as instructed  . [DISCONTINUED] mupirocin ointment (BACTROBAN) 2 % Apply 1 application topically 2 (two) times daily.   No facility-administered encounter medications on file as of 02/15/2020.   ALLERGIES: Allergies  Allergen Reactions  . Doxycycline Shortness Of Breath  . Wellbutrin [Bupropion] Shortness Of Breath, Nausea Only and Palpitations  . Catfish [Fish Allergy]   . Tape Swelling    Plastic tape please use paper    VACCINATION STATUS: Immunization History  Administered Date(s) Administered  . Influenza-Unspecified 02/15/2018  . PPD Test 12/09/2017  . Tdap 12/09/2017    HPI Jaime Flores is 34 y.o. female who presents today with a medical history as above. she is being seen in consultation for hypoglycemia requested by [name], Elige Radon, MD.  she has been dealing with symptoms of intermittent shakiness, tremulousness, increased appetite, for approximately a year.  She is not taking diabetes medications.  She was never diagnosed with diabetes nor prediabetes. -She noticed that her symptoms are worse following consumption of processed carbs and sweetened beverages.  She recently started modifying her diet by increasing protein.  She has gained approximately 20 pounds over the last year. -She was recently diagnosed with depression for which she was put on Abilify.  She thinks this medication has caused her weight gain. She does not have any history of thyroid, adrenal dysfunction.  She is a smoker.  Her blood pressure was found to be marginal at 98/56 today.  She is a mother of 2 children with no history of gestational diabetes.  Review of Systems  Constitutional: +recent weight gain, +  fatigue, no subjective hyperthermia, no subjective hypothermia Eyes: no blurry vision, no xerophthalmia ENT: no sore throat, no nodules palpated in throat, no dysphagia/odynophagia, no hoarseness Cardiovascular: no Chest Pain, no Shortness of Breath, no palpitations, no leg swelling Respiratory: no cough, no shortness of breath Gastrointestinal: no Nausea/Vomiting/Diarhhea Musculoskeletal: no muscle/joint aches Skin: no rashes Neurological: no tremors, no numbness, no tingling,  no dizziness Psychiatric: no depression, no anxiety  Objective:    Vitals with BMI 02/15/2020 12/23/2019 12/23/2019  Height 5' 2.5" - -  Weight 164 lbs 10 oz - -  BMI 05.39 - -  Systolic 98 767 341  Diastolic 56 64 70  Pulse 76 60 82    BP (!) 98/56   Pulse 76   Ht 5' 2.5" (1.588 m)   Wt 164 lb 9.6 oz (74.7 kg)   BMI 29.63 kg/m   Wt Readings from Last 3 Encounters:  02/15/20 164 lb 9.6 oz (74.7 kg)  12/08/19 158 lb (71.7 kg)  11/26/19 153 lb (69.4 kg)    Physical Exam  Constitutional:  Body mass index is 29.63 kg/m.,  not in acute distress, normal state of mind Eyes: PERRLA, EOMI, no exophthalmos ENT: moist mucous membranes, no gross thyromegaly, no gross cervical lymphadenopathy Cardiovascular: normal precordial activity, Regular Rate and Rhythm, no Murmur/Rubs/Gallops Respiratory:  adequate breathing efforts, no gross chest deformity, Clear to auscultation bilaterally Gastrointestinal: abdomen soft, Non -tender, No distension, Bowel Sounds present, no gross organomegaly Musculoskeletal: no gross deformities, strength intact in all four extremities Skin: moist, warm, no rashes Neurological: no tremor with outstretched hands, Deep tendon reflexes normal in bilateral lower extremities.  CMP ( most recent) CMP     Component Value Date/Time   NA 140 12/14/2019 1030   K 3.5 12/14/2019 1030   CL 102 12/14/2019 1030   CO2 24 12/14/2019 1030   GLUCOSE 117 (H) 12/14/2019 1030   GLUCOSE 78 01/27/2013  1422   BUN 4 (L) 12/14/2019 1030   CREATININE 0.67 12/14/2019 1030   CREATININE 0.66 01/27/2013 1422   CALCIUM 9.3 12/14/2019 1030   PROT 6.5 12/14/2019 1030   ALBUMIN 4.1 12/14/2019 1030   AST 17 12/14/2019 1030   ALT 12 12/14/2019 1030   ALKPHOS 117 12/14/2019 1030   BILITOT 0.2 12/14/2019 1030   GFRNONAA 118 12/14/2019 1030   GFRAA 135 12/14/2019 1030    Diabetic Labs (most recent): Lab Results  Component Value Date   HGBA1C 5.8 (A) 02/15/2020     Lipid Panel ( most recent) Lipid Panel     Component Value Date/Time   CHOL 153 12/09/2017 1523   TRIG 64 12/09/2017 1523   HDL 37 (L) 12/09/2017 1523   CHOLHDL 4.1 12/09/2017 1523   LDLCALC 103 (H) 12/09/2017 1523   LABVLDL 13 12/09/2017 1523      Lab Results  Component Value Date   TSH 2.890 12/14/2019   TSH 1.370 10/08/2019   TSH 1.670 07/05/2015   TSH 1.890 03/10/2015   TSH 1.860 02/02/2014   TSH 2.061 01/27/2013       Assessment & Plan:   1. Prediabetes   2.  Reactive hypoglycemia  - ERIKKA FOLLMER  is being seen at a kind request of [name], Fransisca Kaufmann, MD. - I have reviewed her available  records and clinically evaluated the patient. 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  I had a long discussion with her about modifying her diet and introducing more protein and decreasing her consumption of processed carbs. - she  admits there is a room for improvement in her diet and drink choices. -  Suggestion is made for her to avoid simple carbohydrates  from her diet including Cakes, Sweet Desserts / Pastries, Ice Cream, Soda (  diet and regular), Sweet Tea, Candies, Chips, Cookies, Sweet Pastries,  Store Bought Juices, Alcohol in Excess of  1-2 drinks a day, Artificial Sweeteners, Coffee Creamer, and "Sugar-free" Products. This will help  patient to have stable blood glucose profile and potentially avoid unintended weight gain.  -Her A1c is 5.8% consistent with prediabetes.  She would not be offered medication at this time.  Weight loss advised, patient will be tried first. She will start monitoring blood glucose at least twice a day-daily before breakfast, and at any other time when she is symptomatic.  She will bring her logs and meter with her in 9 weeks with repeat CMP.  She will be considered for a referral to dietitian.  - I did not initiate any new prescriptions today. - she is advised to maintain close follow up with [name], Fransisca Kaufmann, MD for primary care needs.   - Time spent with the patient: 45 minutes, of which >50% was spent in  counseling her about her reactive hypoglycemia, prediabetes and the rest in obtaining information about her symptoms, reviewing her previous labs/studies ( including abstractions from other facilities),  evaluations, and treatments,  and developing a plan to confirm diagnosis and long term treatment based on the latest standards of care/guidelines; and documenting her care.  Jaime Flores participated in the discussions, expressed understanding, and voiced agreement with the above plans.  All questions were answered to her satisfaction. she is encouraged to contact clinic should she have any questions or concerns prior to her return visit.  Follow up plan: Return in about 9 weeks (around 04/18/2020) for F/U with Pre-visit Labs.   Glade Lloyd, MD [facility] [address] [place], [place] [address] Phone: [phone]  Fax: [phone]     02/15/2020, 6:18 PM  This note was partially dictated with voice recognition software. Similar sounding words can be transcribed inadequately or may not  be corrected upon review.

## 2020-02-17 ENCOUNTER — Ambulatory Visit
Admission: EM | Admit: 2020-02-17 | Discharge: 2020-02-17 | Disposition: A | Discharge status: Home / Self Care | Payer: Medicaid Other | Attending: Emergency Medicine | Admitting: Emergency Medicine

## 2020-02-17 ENCOUNTER — Encounter: Payer: Self-pay | Admitting: Emergency Medicine

## 2020-02-17 ENCOUNTER — Other Ambulatory Visit: Payer: Self-pay

## 2020-02-17 DIAGNOSIS — R509 Fever, unspecified: Secondary | ICD-10-CM | POA: Diagnosis not present

## 2020-02-17 DIAGNOSIS — J069 Acute upper respiratory infection, unspecified: Secondary | ICD-10-CM | POA: Diagnosis not present

## 2020-02-17 DIAGNOSIS — Z20822 Contact with and (suspected) exposure to covid-19: Secondary | ICD-10-CM | POA: Insufficient documentation

## 2020-02-17 LAB — POCT RAPID STREP A (OFFICE): Rapid Strep A Screen: NEGATIVE

## 2020-02-17 MED ORDER — BENZONATATE 100 MG PO CAPS
100.0000 mg | ORAL_CAPSULE | Freq: Three times a day (TID) | ORAL | 0 refills | Status: DC
Start: 2020-02-17 — End: 2020-05-10

## 2020-02-17 NOTE — ED Provider Notes (Signed)
[place]   [id number] 02/17/20 Arrival Time: 1802   CC: COVID symptoms  SUBJECTIVE: History from: patient.  Jaime Flores is a 34 y.o. female who presents with cough, sore throat and ear pain x 1 day.  Reports COVID exposure.  Denies alleviating or aggravating factors. Reports previous symptoms in the past with COVID.  Denies fever, chills, rhinorrhea, SOB, wheezing, chest pain, nausea, changes in bowel or bladder habits.    ROS: As per HPI.  All other pertinent ROS negative.     Past Medical History:  Diagnosis Date  . Amenorrhea 01/27/2013  . Angio-edema   . Breast pain 03/10/2015  . Encounter for menstrual regulation 07/05/2015  . Irregular intermenstrual bleeding 07/28/2015  . Irregular periods 07/05/2015  . Screening for STD (sexually transmitted disease) 08/07/2013  . Urticaria   . UTI (urinary tract infection)   . Vaginal discharge 06/10/2013   Past Surgical History:  Procedure Laterality Date  . APPENDECTOMY    . CHOLECYSTECTOMY    . TUBAL LIGATION     Allergies  Allergen Reactions  . Doxycycline Shortness Of Breath  . Wellbutrin [Bupropion] Shortness Of Breath, Nausea Only and Palpitations  . Catfish [Fish Allergy]   . Tape Swelling    Plastic tape please use paper   No current facility-administered medications on file prior to encounter.   Current Outpatient Medications on File Prior to Encounter  Medication Sig Dispense Refill  . albuterol (VENTOLIN HFA) 108 (90 Base) MCG/ACT inhaler Use 4 puffs every 4-6 hours as needed for cough or wheeze. 18 g 2  . ARIPiprazole (ABILIFY) 10 MG tablet Take 1 tablet (10 mg total) by mouth daily. 30 tablet 1  . Blood Glucose Monitoring Suppl (ACCU-CHEK GUIDE) w/Device KIT 1 Piece by Does not apply route as directed. 1 kit 0  . cetirizine (ZYRTEC) 10 MG tablet Take 1 tablet (10 mg total) by mouth daily. 30 tablet 5  . citalopram (CELEXA) 40 MG tablet Take 1 tablet (40 mg total) by mouth daily. 90 tablet 1  .  cyclobenzaprine (FLEXERIL) 5 MG tablet Take 1 tablet (5 mg total) by mouth 3 (three) times daily as needed for muscle spasms. (Patient not taking: Reported on 12/12/2019) 30 tablet 1  . diphenhydrAMINE (BENADRYL) 25 MG tablet Take 25 mg by mouth every 6 (six) hours as needed.    [name] EPINEPHrine 0.3 mg/0.3 mL IJ SOAJ injection Inject 0.3 mLs (0.3 mg total) into the muscle as needed for anaphylaxis. 2 each 2  . fluticasone (FLONASE) 50 MCG/ACT nasal spray Place 2 sprays into both nostrils daily. 16 g 0  . glucose blood (ACCU-CHEK GUIDE) test strip Use as instructed 2-3 x daily. E11.65 100 each 2  . ibuprofen (ADVIL) 800 MG tablet Take 1 tablet (800 mg total) by mouth 3 (three) times daily. Take with food 60 tablet 0  . Multiple Vitamins-Calcium (ONE-A-DAY WOMENS FORMULA PO) Take by mouth.     Social History   Socioeconomic History  . Marital status: Single    Spouse name: Not on file  . Number of children: 2  . Years of education: Not on file  . Highest education level: Not on file  Occupational History  . Not on file  Tobacco Use  . Smoking status: Current Every Day Smoker    Packs/day: 0.50    Types: Cigarettes  . Smokeless tobacco: Never Used  Vaping Use  . Vaping Use: Never used  Substance and Sexual Activity  . Alcohol use: No  .  Drug use: No  . Sexual activity: Yes    Birth control/protection: Surgical    Comment: tubal  Other Topics Concern  . Not on file  Social History Narrative  . Not on file   Social Determinants of Health   Financial Resource Strain:   . Difficulty of Paying Living Expenses:   Food Insecurity:   . Worried About Charity fundraiser in the Last Year:   . Arboriculturist in the Last Year:   Transportation Needs:   . Film/video editor (Medical):   [name] Lack of Transportation (Non-Medical):   Physical Activity:   . Days of Exercise per Week:   . Minutes of Exercise per Session:   Stress:   . Feeling of Stress :   Social Connections:   .  Frequency of Communication with Friends and Family:   . Frequency of Social Gatherings with Friends and Family:   . Attends Religious Services:   . Active Member of Clubs or Organizations:   . Attends Archivist Meetings:   [name] Marital Status:   Intimate Partner Violence:   . Fear of Current or Ex-Partner:   . Emotionally Abused:   [name] Physically Abused:   . Sexually Abused:    Family History  Problem Relation Age of Onset  . Autism Son   . Diabetes Maternal Grandmother   . Stroke Maternal Grandmother   . Heart failure Maternal Grandmother   . Stroke Maternal Grandfather   . Stroke Father   . Seizures Father   . Allergies Father        penicillin  . Heart attack Father   . Other Mother        herpes  . Hyperlipidemia Mother   . Heart failure Paternal Grandfather   . Diabetes Paternal Grandmother   . Hypertension Paternal Grandmother   . Autism Brother   . Other Paternal Uncle        heart exploded  . Cancer Maternal Aunt   . Diabetes Maternal Aunt   . Hypertension Maternal Aunt   . Immunodeficiency Neg Hx   . Urticaria Neg Hx   . Eczema Neg Hx   . Atopy Neg Hx   . Asthma Neg Hx   . Angioedema Neg Hx   . Allergic rhinitis Neg Hx     OBJECTIVE:  Vitals:   02/17/20 1831 02/17/20 1833  BP:  118/73  Pulse:  84  Resp:  18  Temp:  99.2 F (37.3 C)  TempSrc:  Oral  SpO2:  95%  Weight: 164 lb (74.4 kg)   Height: '5\' 2"'$  (1.575 m)      General appearance: alert; appears fatigued, but nontoxic; speaking in full sentences and tolerating own secretions HEENT: NCAT; Ears: EACs clear, TMs pearly gray; Eyes: PERRL.  EOM grossly intact.  Nose: nares patent without rhinorrhea, Throat: oropharynx clear, tonsils non erythematous or enlarged, uvula midline  Neck: supple without LAD Lungs: unlabored respirations, symmetrical air entry; cough: absent; no respiratory distress; CTAB Heart: regular rate and rhythm.  Skin: warm and dry Psychological: alert and cooperative;  normal mood and affect  LABS:  Results for orders placed or performed during the hospital encounter of 02/17/20 (from the past 24 hour(s))  POCT rapid strep A     Status: None   Collection Time: 02/17/20  6:45 PM  Result Value Ref Range   Rapid Strep A Screen Negative Negative     ASSESSMENT & PLAN:  1. Fever, unspecified  2. Viral URI with cough   3. Exposure to COVID-19 virus   4. Suspected COVID-19 virus infection     Meds ordered this encounter  Medications  . benzonatate (TESSALON) 100 MG capsule    Sig: Take 1 capsule (100 mg total) by mouth every 8 (eight) hours.    Dispense:  21 capsule    Refill:  0    Order Specific Question:   Supervising Provider    Answer:   Raylene Everts [[id number]]    Strep negative.  Culture sent.  They will call you with abnormal results COVID testing ordered.  It will take between 2-5 days for test results.  Someone will contact you regarding abnormal results.    In the meantime: You should remain isolated in your home for 10 days from symptom onset AND greater than 72 hours after symptoms resolution (absence of fever without the use of fever-reducing medication and improvement in respiratory symptoms), whichever is longer Get plenty of rest and push fluids Tessalon Perles prescribed for cough Use OTC zyrtec for nasal congestion, runny nose, and/or sore throat Use OTC flonase for nasal congestion and runny nose Use medications daily for symptom relief Use OTC medications like ibuprofen or tylenol as needed fever or pain Call or go to the ED if you have any new or worsening symptoms such as fever, cough, shortness of breath, chest tightness, chest pain, turning blue, changes in mental status, etc...   Reviewed expectations re: course of current medical issues. Questions answered. Outlined signs and symptoms indicating need for more acute intervention. Patient verbalized understanding. After Visit Summary given.         Lestine Box, PA-C 02/17/20 1850

## 2020-02-17 NOTE — ED Triage Notes (Signed)
Cough, sore throat, ear pain.  Boyfriends daughter has covid and pt has been around her all weekend

## 2020-02-17 NOTE — Discharge Instructions (Signed)
Strep negative.  Culture sent.  They will call you with abnormal results COVID testing ordered.  It will take between 2-5 days for test results.  Someone will contact you regarding abnormal results.    In the meantime: You should remain isolated in your home for 10 days from symptom onset AND greater than 72 hours after symptoms resolution (absence of fever without the use of fever-reducing medication and improvement in respiratory symptoms), whichever is longer Get plenty of rest and push fluids Tessalon Perles prescribed for cough Use OTC zyrtec for nasal congestion, runny nose, and/or sore throat Use OTC flonase for nasal congestion and runny nose Use medications daily for symptom relief Use OTC medications like ibuprofen or tylenol as needed fever or pain Call or go to the ED if you have any new or worsening symptoms such as fever, cough, shortness of breath, chest tightness, chest pain, turning blue, changes in mental status, etc..[name]

## 2020-02-19 NOTE — Telephone Encounter (Signed)
Pt scheduled with Dr Warrick Parisian 02/25/20 at 9:55 to discuss as pt was supposed to follow up around 02/15/20.

## 2020-02-20 ENCOUNTER — Telehealth: Payer: Self-pay

## 2020-02-20 LAB — CULTURE, GROUP A STREP (THRC)

## 2020-02-20 NOTE — Telephone Encounter (Signed)
Pt called for covid results- advised pt that results were not back yet.

## 2020-02-21 NOTE — Telephone Encounter (Signed)
Pt called for covid results. No results yet.  Test was done 02/17/20. Will call [facility] in am to find out when it will be resulted and call pt back.

## 2020-02-22 LAB — SARS-COV-2, NAA 2 DAY TAT

## 2020-02-22 LAB — NOVEL CORONAVIRUS, NAA: SARS-CoV-2, NAA: NOT DETECTED

## 2020-02-22 NOTE — Telephone Encounter (Signed)
Pt. Called back, given results. Verbalizes understanding. 

## 2020-02-22 NOTE — Telephone Encounter (Signed)
Called [facility] and results were ready just not released- Pt covid is non detected. Read back given and correct. Attempted to call pt but LM to call back [phone]

## 2020-02-26 ENCOUNTER — Ambulatory Visit: Payer: Medicaid Other | Admitting: Family Medicine

## 2020-02-29 ENCOUNTER — Encounter: Payer: Self-pay | Admitting: Family Medicine

## 2020-04-18 ENCOUNTER — Ambulatory Visit: Payer: Medicaid Other | Admitting: "Endocrinology

## 2020-04-23 ENCOUNTER — Ambulatory Visit
Admission: EM | Admit: 2020-04-23 | Discharge: 2020-04-23 | Disposition: A | Discharge status: Home / Self Care | Payer: Medicaid Other | Attending: Emergency Medicine | Admitting: Emergency Medicine

## 2020-04-23 DIAGNOSIS — J029 Acute pharyngitis, unspecified: Secondary | ICD-10-CM | POA: Diagnosis not present

## 2020-04-23 DIAGNOSIS — R509 Fever, unspecified: Secondary | ICD-10-CM

## 2020-04-23 LAB — POCT RAPID STREP A (OFFICE): Rapid Strep A Screen: NEGATIVE

## 2020-04-23 MED ORDER — MELOXICAM 15 MG PO TABS
15.0000 mg | ORAL_TABLET | Freq: Every day | ORAL | 0 refills | Status: DC
Start: 2020-04-23 — End: 2020-08-12

## 2020-04-23 NOTE — ED Provider Notes (Signed)
[place]   [id number] 04/23/20 Arrival Time: 1025   CC: Sore throat  SUBJECTIVE: History from: patient.  Jaime Flores is a 34 y.o. female who presents with sore throat x 2 weeks and fever, tmax 100 at home, x 1 day.  Denies sick exposure to COVID, flu or strep.  Denies alleviating factors.  Symptoms are made worse with swallowing.  Reports previous COVID infection in the past.   Denies chills, fatigue, sinus pain, rhinorrhea, sore throat, SOB, wheezing, chest pain, nausea, changes in bowel or bladder habits.    ROS: As per HPI.  All other pertinent ROS negative.     Past Medical History:  Diagnosis Date  . Amenorrhea 01/27/2013  . Angio-edema   . Breast pain 03/10/2015  . Encounter for menstrual regulation 07/05/2015  . Irregular intermenstrual bleeding 07/28/2015  . Irregular periods 07/05/2015  . Screening for STD (sexually transmitted disease) 08/07/2013  . Urticaria   . UTI (urinary tract infection)   . Vaginal discharge 06/10/2013   Past Surgical History:  Procedure Laterality Date  . APPENDECTOMY    . CHOLECYSTECTOMY    . TUBAL LIGATION     Allergies  Allergen Reactions  . Doxycycline Shortness Of Breath  . Wellbutrin [Bupropion] Shortness Of Breath, Nausea Only and Palpitations  . Catfish [Fish Allergy]   . Tape Swelling    Plastic tape please use paper   No current facility-administered medications on file prior to encounter.   Current Outpatient Medications on File Prior to Encounter  Medication Sig Dispense Refill  . albuterol (VENTOLIN HFA) 108 (90 Base) MCG/ACT inhaler Use 4 puffs every 4-6 hours as needed for cough or wheeze. 18 g 2  . ARIPiprazole (ABILIFY) 10 MG tablet Take 1 tablet (10 mg total) by mouth daily. 30 tablet 1  . benzonatate (TESSALON) 100 MG capsule Take 1 capsule (100 mg total) by mouth every 8 (eight) hours. 21 capsule 0  . Blood Glucose Monitoring Suppl (ACCU-CHEK GUIDE) w/Device KIT 1 Piece by Does not apply route as  directed. 1 kit 0  . cetirizine (ZYRTEC) 10 MG tablet Take 1 tablet (10 mg total) by mouth daily. 30 tablet 5  . citalopram (CELEXA) 40 MG tablet Take 1 tablet (40 mg total) by mouth daily. 90 tablet 1  . cyclobenzaprine (FLEXERIL) 5 MG tablet Take 1 tablet (5 mg total) by mouth 3 (three) times daily as needed for muscle spasms. (Patient not taking: Reported on 12/12/2019) 30 tablet 1  . diphenhydrAMINE (BENADRYL) 25 MG tablet Take 25 mg by mouth every 6 (six) hours as needed.    [name] EPINEPHrine 0.3 mg/0.3 mL IJ SOAJ injection Inject 0.3 mLs (0.3 mg total) into the muscle as needed for anaphylaxis. 2 each 2  . fluticasone (FLONASE) 50 MCG/ACT nasal spray Place 2 sprays into both nostrils daily. 16 g 0  . glucose blood (ACCU-CHEK GUIDE) test strip Use as instructed 2-3 x daily. E11.65 100 each 2  . ibuprofen (ADVIL) 800 MG tablet Take 1 tablet (800 mg total) by mouth 3 (three) times daily. Take with food 60 tablet 0  . Multiple Vitamins-Calcium (ONE-A-DAY WOMENS FORMULA PO) Take by mouth.     Social History   Socioeconomic History  . Marital status: Single    Spouse name: Not on file  . Number of children: 2  . Years of education: Not on file  . Highest education level: Not on file  Occupational History  . Not on file  Tobacco Use  .  Smoking status: Current Every Day Smoker    Packs/day: 0.50    Types: Cigarettes  . Smokeless tobacco: Never Used  Vaping Use  . Vaping Use: Never used  Substance and Sexual Activity  . Alcohol use: No  . Drug use: No  . Sexual activity: Yes    Birth control/protection: Surgical    Comment: tubal  Other Topics Concern  . Not on file  Social History Narrative  . Not on file   Social Determinants of Health   Financial Resource Strain:   . Difficulty of Paying Living Expenses: Not on file  Food Insecurity:   . Worried About Charity fundraiser in the Last Year: Not on file  . Ran Out of Food in the Last Year: Not on file  Transportation Needs:   .  Lack of Transportation (Medical): Not on file  . Lack of Transportation (Non-Medical): Not on file  Physical Activity:   . Days of Exercise per Week: Not on file  . Minutes of Exercise per Session: Not on file  Stress:   . Feeling of Stress : Not on file  Social Connections:   . Frequency of Communication with Friends and Family: Not on file  . Frequency of Social Gatherings with Friends and Family: Not on file  . Attends Religious Services: Not on file  . Active Member of Clubs or Organizations: Not on file  . Attends Archivist Meetings: Not on file  . Marital Status: Not on file  Intimate Partner Violence:   . Fear of Current or Ex-Partner: Not on file  . Emotionally Abused: Not on file  . Physically Abused: Not on file  . Sexually Abused: Not on file   Family History  Problem Relation Age of Onset  . Autism Son   . Diabetes Maternal Grandmother   . Stroke Maternal Grandmother   . Heart failure Maternal Grandmother   . Stroke Maternal Grandfather   . Stroke Father   . Seizures Father   . Allergies Father        penicillin  . Heart attack Father   . Other Mother        herpes  . Hyperlipidemia Mother   . Heart failure Paternal Grandfather   . Diabetes Paternal Grandmother   . Hypertension Paternal Grandmother   . Autism Brother   . Other Paternal Uncle        heart exploded  . Cancer Maternal Aunt   . Diabetes Maternal Aunt   . Hypertension Maternal Aunt   . Immunodeficiency Neg Hx   . Urticaria Neg Hx   . Eczema Neg Hx   . Atopy Neg Hx   . Asthma Neg Hx   . Angioedema Neg Hx   . Allergic rhinitis Neg Hx     OBJECTIVE:  Vitals:   04/23/20 1604  BP: 129/85  Pulse: 86  Resp: 20  Temp: 99 F (37.2 C)  SpO2: 96%     General appearance: alert; well-appearing, nontoxic; speaking in full sentences and tolerating own secretions HEENT: NCAT; Ears: EACs clear, TMs pearly gray; Eyes: PERRL.  EOM grossly intact.Nose: nares patent without rhinorrhea,  Throat: oropharynx clear, tonsils non erythematous or enlarged, uvula midline  Neck: supple without LAD Lungs: unlabored respirations, symmetrical air entry; cough: absent; no respiratory distress; CTAB Heart: regular rate and rhythm.  Skin: warm and dry Psychological: alert and cooperative; normal mood and affect   LABS:  Strep negative  ASSESSMENT & PLAN:  1. Sore  throat   2. Fever, unspecified     Meds ordered this encounter  Medications  . meloxicam (MOBIC) 15 MG tablet    Sig: Take 1 tablet (15 mg total) by mouth daily.    Dispense:  30 tablet    Refill:  0    Order Specific Question:   Supervising Provider    Answer:   Raylene Everts [[id number]]   Declines COVID test Get plenty of rest and push fluids Mobic for sore throat Follow up with PCP Go to the ED if you have any new or worsening symptoms such as fever, worsening cough, shortness of breath, chest tightness, chest pain, turning blue, changes in mental status, etc...   Reviewed expectations re: course of current medical issues. Questions answered. Outlined signs and symptoms indicating need for more acute intervention. Patient verbalized understanding. After Visit Summary given.         Lestine Box, PA-C 04/23/20 1607

## 2020-04-23 NOTE — ED Triage Notes (Signed)
Pt states she has had fever since yesterday and sore throat for past 2 weeks

## 2020-04-23 NOTE — Discharge Instructions (Signed)
Declines COVID test Get plenty of rest and push fluids Mobic for sore throat Follow up with PCP Go to the ED if you have any new or worsening symptoms such as fever, worsening cough, shortness of breath, chest tightness, chest pain, turning blue, changes in mental status, etc..[name]

## 2020-05-10 ENCOUNTER — Other Ambulatory Visit: Payer: Self-pay

## 2020-05-10 ENCOUNTER — Ambulatory Visit
Admission: EM | Admit: 2020-05-10 | Discharge: 2020-05-10 | Disposition: A | Discharge status: Home / Self Care | Payer: Medicaid Other | Attending: Emergency Medicine | Admitting: Emergency Medicine

## 2020-05-10 DIAGNOSIS — J208 Acute bronchitis due to other specified organisms: Secondary | ICD-10-CM | POA: Diagnosis not present

## 2020-05-10 MED ORDER — BENZONATATE 100 MG PO CAPS
100.0000 mg | ORAL_CAPSULE | Freq: Three times a day (TID) | ORAL | 0 refills | Status: DC
Start: 2020-05-10 — End: 2020-08-12

## 2020-05-10 MED ORDER — PREDNISONE 10 MG PO TABS
20.0000 mg | ORAL_TABLET | Freq: Every day | ORAL | 0 refills | Status: DC
Start: 2020-05-10 — End: 2020-08-12

## 2020-05-10 MED ORDER — AZITHROMYCIN 250 MG PO TABS
250.0000 mg | ORAL_TABLET | Freq: Every day | ORAL | 0 refills | Status: DC
Start: 2020-05-10 — End: 2020-08-12

## 2020-05-10 NOTE — Discharge Instructions (Addendum)
  Get plenty of rest and push fluids Tessalon Perles prescribed for cough Prednisone was prescribed Azithromycin was prescribed use medications daily for symptom relief Use OTC medications like ibuprofen or tylenol as needed fever or pain Call or go to the ED if you have any new or worsening symptoms such as fever, worsening cough, shortness of breath, chest tightness, chest pain, turning blue, changes in mental status, etc..[name]

## 2020-05-10 NOTE — ED Triage Notes (Signed)
Pt presents with c/o cough and nasal congestion that recently began

## 2020-05-10 NOTE — ED Provider Notes (Addendum)
[place]   [id number] 05/10/20 Arrival Time: 5621   CC: URI symptoms  SUBJECTIVE: History from: patient.  Jaime Flores is a 34 y.o. female who presents presented to the urgent care for complaint of cough days ago and nasal congestion that started 2-3 days ago.  Denies sick exposure to COVID, flu or strep.  Denies recent travel.  Has tried OTC medication with no relief.  Denies aggravating factors.  Denies previous symptoms in the past.   Denies fever, chills, fatigue, sinus pain, rhinorrhea, sore throat, SOB, wheezing, chest pain, nausea, changes in bowel or bladder habits.     ROS: As per HPI.  All other pertinent ROS negative.     Past Medical History:  Diagnosis Date  . Amenorrhea 01/27/2013  . Angio-edema   . Breast pain 03/10/2015  . Encounter for menstrual regulation 07/05/2015  . Irregular intermenstrual bleeding 07/28/2015  . Irregular periods 07/05/2015  . Screening for STD (sexually transmitted disease) 08/07/2013  . Urticaria   . UTI (urinary tract infection)   . Vaginal discharge 06/10/2013   Past Surgical History:  Procedure Laterality Date  . APPENDECTOMY    . CHOLECYSTECTOMY    . TUBAL LIGATION     Allergies  Allergen Reactions  . Doxycycline Shortness Of Breath  . Wellbutrin [Bupropion] Shortness Of Breath, Nausea Only and Palpitations  . Catfish [Fish Allergy]   . Tape Swelling    Plastic tape please use paper   No current facility-administered medications on file prior to encounter.   Current Outpatient Medications on File Prior to Encounter  Medication Sig Dispense Refill  . albuterol (VENTOLIN HFA) 108 (90 Base) MCG/ACT inhaler Use 4 puffs every 4-6 hours as needed for cough or wheeze. 18 g 2  . ARIPiprazole (ABILIFY) 10 MG tablet Take 1 tablet (10 mg total) by mouth daily. 30 tablet 1  . Blood Glucose Monitoring Suppl (ACCU-CHEK GUIDE) w/Device KIT 1 Piece by Does not apply route as directed. 1 kit 0  . cetirizine (ZYRTEC) 10 MG  tablet Take 1 tablet (10 mg total) by mouth daily. 30 tablet 5  . citalopram (CELEXA) 40 MG tablet Take 1 tablet (40 mg total) by mouth daily. 90 tablet 1  . cyclobenzaprine (FLEXERIL) 5 MG tablet Take 1 tablet (5 mg total) by mouth 3 (three) times daily as needed for muscle spasms. (Patient not taking: Reported on 12/12/2019) 30 tablet 1  . diphenhydrAMINE (BENADRYL) 25 MG tablet Take 25 mg by mouth every 6 (six) hours as needed.    [name] EPINEPHrine 0.3 mg/0.3 mL IJ SOAJ injection Inject 0.3 mLs (0.3 mg total) into the muscle as needed for anaphylaxis. 2 each 2  . fluticasone (FLONASE) 50 MCG/ACT nasal spray Place 2 sprays into both nostrils daily. 16 g 0  . glucose blood (ACCU-CHEK GUIDE) test strip Use as instructed 2-3 x daily. E11.65 100 each 2  . ibuprofen (ADVIL) 800 MG tablet Take 1 tablet (800 mg total) by mouth 3 (three) times daily. Take with food 60 tablet 0  . meloxicam (MOBIC) 15 MG tablet Take 1 tablet (15 mg total) by mouth daily. 30 tablet 0  . Multiple Vitamins-Calcium (ONE-A-DAY WOMENS FORMULA PO) Take by mouth.     Social History   Socioeconomic History  . Marital status: Single    Spouse name: Not on file  . Number of children: 2  . Years of education: Not on file  . Highest education level: Not on file  Occupational History  .  Not on file  Tobacco Use  . Smoking status: Current Every Day Smoker    Packs/day: 0.50    Types: Cigarettes  . Smokeless tobacco: Never Used  Vaping Use  . Vaping Use: Never used  Substance and Sexual Activity  . Alcohol use: No  . Drug use: No  . Sexual activity: Yes    Birth control/protection: Surgical    Comment: tubal  Other Topics Concern  . Not on file  Social History Narrative  . Not on file   Social Determinants of Health   Financial Resource Strain:   . Difficulty of Paying Living Expenses: Not on file  Food Insecurity:   . Worried About Charity fundraiser in the Last Year: Not on file  . Ran Out of Food in the Last  Year: Not on file  Transportation Needs:   . Lack of Transportation (Medical): Not on file  . Lack of Transportation (Non-Medical): Not on file  Physical Activity:   . Days of Exercise per Week: Not on file  . Minutes of Exercise per Session: Not on file  Stress:   . Feeling of Stress : Not on file  Social Connections:   . Frequency of Communication with Friends and Family: Not on file  . Frequency of Social Gatherings with Friends and Family: Not on file  . Attends Religious Services: Not on file  . Active Member of Clubs or Organizations: Not on file  . Attends Archivist Meetings: Not on file  . Marital Status: Not on file  Intimate Partner Violence:   . Fear of Current or Ex-Partner: Not on file  . Emotionally Abused: Not on file  . Physically Abused: Not on file  . Sexually Abused: Not on file   Family History  Problem Relation Age of Onset  . Autism Son   . Diabetes Maternal Grandmother   . Stroke Maternal Grandmother   . Heart failure Maternal Grandmother   . Stroke Maternal Grandfather   . Stroke Father   . Seizures Father   . Allergies Father        penicillin  . Heart attack Father   . Other Mother        herpes  . Hyperlipidemia Mother   . Heart failure Paternal Grandfather   . Diabetes Paternal Grandmother   . Hypertension Paternal Grandmother   . Autism Brother   . Other Paternal Uncle        heart exploded  . Cancer Maternal Aunt   . Diabetes Maternal Aunt   . Hypertension Maternal Aunt   . Immunodeficiency Neg Hx   . Urticaria Neg Hx   . Eczema Neg Hx   . Atopy Neg Hx   . Asthma Neg Hx   . Angioedema Neg Hx   . Allergic rhinitis Neg Hx     OBJECTIVE:  Vitals:   05/10/20 0952  BP: 113/74  Pulse: 78  Resp: 16  Temp: 98.2 F (36.8 C)  SpO2: 98%     General appearance: alert; appears fatigued, but nontoxic; speaking in full sentences and tolerating own secretions HEENT: NCAT; Ears: EACs clear, TMs pearly gray; Eyes: PERRL.  EOM  grossly intact. Sinuses: nontender; Nose: nares patent without rhinorrhea, Throat: oropharynx clear, tonsils non erythematous or enlarged, uvula midline  Neck: supple without LAD Lungs: unlabored respirations, symmetrical air entry; cough: moderate; no respiratory distress; CTAB Heart: regular rate and rhythm.  Radial pulses 2+ symmetrical bilaterally Skin: warm and dry Psychological: alert and  cooperative; normal mood and affect  LABS:  No results found for this or any previous visit (from the past 24 hour(s)).   ASSESSMENT & PLAN:  1. Acute bronchitis due to other specified organisms     Meds ordered this encounter  Medications  . predniSONE (DELTASONE) 10 MG tablet    Sig: Take 2 tablets (20 mg total) by mouth daily.    Dispense:  15 tablet    Refill:  0  . benzonatate (TESSALON) 100 MG capsule    Sig: Take 1 capsule (100 mg total) by mouth every 8 (eight) hours.    Dispense:  30 capsule    Refill:  0  . azithromycin (ZITHROMAX) 250 MG tablet    Sig: Take 1 tablet (250 mg total) by mouth daily. Take first 2 tablets together, then 1 every day until finished.    Dispense:  6 tablet    Refill:  0    Discharge Instructions.    Get plenty of rest and push fluids Tessalon Perles prescribed for cough Prednisone was prescribed Azithromycin was prescribed use medications daily for symptom relief Use OTC medications like ibuprofen or tylenol as needed fever or pain Call or go to the ED if you have any new or worsening symptoms such as fever, worsening cough, shortness of breath, chest tightness, chest pain, turning blue, changes in mental status, etc...   Reviewed expectations re: course of current medical issues. Questions answered. Outlined signs and symptoms indicating need for more acute intervention. Patient verbalized understanding. After Visit Summary given.         Emerson Monte, FNP 05/10/20 1012    Emerson Monte, FNP 05/10/20 1015    Emerson Monte, FNP 05/10/20 1015

## 2020-08-04 ENCOUNTER — Emergency Department (HOSPITAL_COMMUNITY): Payer: Medicaid Other

## 2020-08-04 ENCOUNTER — Encounter (HOSPITAL_COMMUNITY): Payer: Self-pay

## 2020-08-04 ENCOUNTER — Emergency Department (HOSPITAL_COMMUNITY)
Admission: EM | Admit: 2020-08-04 | Discharge: 2020-08-04 | Disposition: A | Discharge status: Home / Self Care | Payer: Medicaid Other | Attending: Emergency Medicine | Admitting: Emergency Medicine

## 2020-08-04 ENCOUNTER — Other Ambulatory Visit: Payer: Self-pay

## 2020-08-04 ENCOUNTER — Encounter: Payer: Self-pay | Admitting: Emergency Medicine

## 2020-08-04 ENCOUNTER — Ambulatory Visit
Admission: EM | Admit: 2020-08-04 | Discharge: 2020-08-04 | Disposition: A | Discharge status: Home / Self Care | Payer: Medicaid Other

## 2020-08-04 DIAGNOSIS — G44219 Episodic tension-type headache, not intractable: Secondary | ICD-10-CM | POA: Diagnosis not present

## 2020-08-04 DIAGNOSIS — M542 Cervicalgia: Secondary | ICD-10-CM | POA: Insufficient documentation

## 2020-08-04 DIAGNOSIS — K029 Dental caries, unspecified: Secondary | ICD-10-CM | POA: Insufficient documentation

## 2020-08-04 DIAGNOSIS — R208 Other disturbances of skin sensation: Secondary | ICD-10-CM | POA: Diagnosis not present

## 2020-08-04 DIAGNOSIS — M62830 Muscle spasm of back: Secondary | ICD-10-CM | POA: Insufficient documentation

## 2020-08-04 DIAGNOSIS — G44209 Tension-type headache, unspecified, not intractable: Secondary | ICD-10-CM | POA: Diagnosis not present

## 2020-08-04 DIAGNOSIS — R55 Syncope and collapse: Secondary | ICD-10-CM

## 2020-08-04 DIAGNOSIS — Z20822 Contact with and (suspected) exposure to covid-19: Secondary | ICD-10-CM | POA: Insufficient documentation

## 2020-08-04 DIAGNOSIS — R519 Headache, unspecified: Secondary | ICD-10-CM

## 2020-08-04 DIAGNOSIS — F17210 Nicotine dependence, cigarettes, uncomplicated: Secondary | ICD-10-CM | POA: Insufficient documentation

## 2020-08-04 LAB — BASIC METABOLIC PANEL
Anion gap: 9 (ref 5–15)
BUN: 5 mg/dL — ABNORMAL LOW (ref 6–20)
CO2: 30 mmol/L (ref 22–32)
Calcium: 9.1 mg/dL (ref 8.9–10.3)
Chloride: 99 mmol/L (ref 98–111)
Creatinine, Ser: 0.75 mg/dL (ref 0.44–1.00)
GFR, Estimated: >60 mL/min (ref >60)
Glucose, Bld: 95 mg/dL (ref 70–99)
Potassium: 3.1 mmol/L — ABNORMAL LOW (ref 3.5–5.1)
Sodium: 138 mmol/L (ref 135–145)

## 2020-08-04 LAB — URINALYSIS, ROUTINE W REFLEX MICROSCOPIC
Bilirubin Urine: NEGATIVE
Glucose, UA: NEGATIVE mg/dL
Ketones, ur: 5 mg/dL — AB
Nitrite: NEGATIVE
Protein, ur: 30 mg/dL — AB
Specific Gravity, Urine: 1.017 (ref 1.005–1.030)
pH: 6 (ref 5.0–8.0)

## 2020-08-04 LAB — CBC
HCT: 44.2 % (ref 36.0–46.0)
Hemoglobin: 13.7 g/dL (ref 12.0–15.0)
MCH: 28.7 pg (ref 26.0–34.0)
MCHC: 31 g/dL (ref 30.0–36.0)
MCV: 92.5 fL (ref 80.0–100.0)
Platelets: 396 10*3/uL (ref 150–400)
RBC: 4.78 MIL/uL (ref 3.87–5.11)
RDW: 14.5 % (ref 11.5–15.5)
WBC: 9.7 10*3/uL (ref 4.0–10.5)
nRBC: 0 % (ref 0.0–0.2)

## 2020-08-04 LAB — PREGNANCY, URINE: Preg Test, Ur: NEGATIVE

## 2020-08-04 LAB — MAGNESIUM: Magnesium: 1.9 mg/dL (ref 1.7–2.4)

## 2020-08-04 LAB — TROPONIN I (HIGH SENSITIVITY): Troponin I (High Sensitivity): <2 ng/L (ref <18)

## 2020-08-04 MED ORDER — IBUPROFEN 800 MG PO TABS
800.0000 mg | ORAL_TABLET | Freq: Once | ORAL | Status: AC
  Administered 2020-08-04: 800 mg via ORAL
  Filled 2020-08-04: qty 1

## 2020-08-04 MED ORDER — OXYCODONE HCL 5 MG PO TABS
5.0000 mg | ORAL_TABLET | Freq: Once | ORAL | Status: AC
  Administered 2020-08-04: 5 mg via ORAL
  Filled 2020-08-04: qty 1

## 2020-08-04 NOTE — Discharge Instructions (Addendum)
Go to the ER for further evaluation and treatment. 

## 2020-08-04 NOTE — ED Notes (Signed)
Pt to CT

## 2020-08-04 NOTE — ED Provider Notes (Signed)
[facility]    CSN: [id number] Arrival date & time: 08/04/20  4332      History   Chief Complaint No chief complaint on file.   HPI Jaime Flores is a 35 y.o. female.   Reports that yesterday she felt a burning sensation at the back of her head and states that she felt like something was off with her.  Reports that she went home from work at lunch, got in the house and passed out.  Reports that the next thing she knew, it was 4 PM and she had been unconscious the whole time.  Reports that she has passed out 1 time previously in her life in 2012, reports that she got dizzy, nauseated, felt like her legs were Jell-O and then she fell to the ground.  Reports with the previous episode that she could tell she was going to pass out and she woke up just as soon as she hit the ground.  Denies taking any new medications, denies drug use denies alcohol use, denies changes in vision.  Denies current headache, nausea, vomiting, diarrhea, rash, fever, other symptoms.  ROS per HPI  The history is provided by the patient.    Past Medical History:  Diagnosis Date  . Amenorrhea 01/27/2013  . Angio-edema   . Breast pain 03/10/2015  . Encounter for menstrual regulation 07/05/2015  . Irregular intermenstrual bleeding 07/28/2015  . Irregular periods 07/05/2015  . Screening for STD (sexually transmitted disease) 08/07/2013  . Urticaria   . UTI (urinary tract infection)   . Vaginal discharge 06/10/2013    Patient Active Problem List   Diagnosis Date Noted  . Prediabetes 02/15/2020  . 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 Anxiety 11/18/2019  . Depression, recurrent ([place]) 11/18/2019  . Encounter for gynecological examination with Papanicolaou smear of cervix 06/03/2019  . 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 Cough 05/15/2019  . Seasonal and perennial allergic rhinitis 05/15/2019  . Bug bite 02/06/2019  . Hemorrhoids 09/26/2016  . Irregular intermenstrual  bleeding 07/28/2015  . Amenorrhea 01/27/2013    Past Surgical History:  Procedure Laterality Date  . APPENDECTOMY    . CHOLECYSTECTOMY    . TUBAL LIGATION      OB History    Gravida  2   Para  2   Term  1   Preterm  1   AB      Living  2     SAB      IAB      Ectopic      Multiple      Live Births               Home Medications    Prior to Admission medications   Medication Sig Start Date End Date Taking? Authorizing Provider  albuterol (VENTOLIN HFA) 108 (90 Base) MCG/ACT inhaler Use 4 puffs every 4-6 hours as needed for cough or wheeze. 05/15/19   Valentina Shaggy, MD  ARIPiprazole (ABILIFY) 10 MG tablet Take 1 tablet (10 mg total) by mouth daily. 01/18/20   [name], Fransisca Kaufmann, MD  azithromycin (ZITHROMAX) 250 MG tablet Take 1 tablet (250 mg total) by mouth daily. Take first 2 tablets together, then 1 every day until finished. 05/10/20   Avegno, Darrelyn Hillock, FNP  benzonatate (TESSALON) 100 MG capsule Take 1 capsule (100 mg total) by mouth every 8 (eight) hours. 05/10/20   Avegno, Darrelyn Hillock, FNP  Blood Glucose Monitoring Suppl (ACCU-CHEK GUIDE) w/Device KIT  1 Piece by Does not apply route as directed. 02/15/20   Cassandria Anger, MD  cetirizine (ZYRTEC) 10 MG tablet Take 1 tablet (10 mg total) by mouth daily. 11/13/19 12/13/19  Valentina Shaggy, MD  citalopram (CELEXA) 40 MG tablet Take 1 tablet (40 mg total) by mouth daily. 12/14/19   [name], Fransisca Kaufmann, MD  cyclobenzaprine (FLEXERIL) 5 MG tablet Take 1 tablet (5 mg total) by mouth 3 (three) times daily as needed for muscle spasms. Patient not taking: No sig reported 10/08/19   Chevis Pretty, FNP  diphenhydrAMINE (BENADRYL) 25 MG tablet Take 25 mg by mouth every 6 (six) hours as needed.    [provider]  EPINEPHrine 0.3 mg/0.3 mL IJ SOAJ injection Inject 0.3 mLs (0.3 mg total) into the muscle as needed for anaphylaxis. 11/13/19   Valentina Shaggy, MD  fluticasone [facility]) 50  MCG/ACT nasal spray Place 2 sprays into both nostrils daily. 11/30/19   Wurst, [place], PA-C  glucose blood (ACCU-CHEK GUIDE) test strip Use as instructed 2-3 x daily. E11.65 02/15/20   Cassandria Anger, MD  ibuprofen (ADVIL) 800 MG tablet Take 1 tablet (800 mg total) by mouth 3 (three) times daily. Take with food 09/24/19   Avegno, Darrelyn Hillock, FNP  meloxicam (MOBIC) 15 MG tablet Take 1 tablet (15 mg total) by mouth daily. 04/23/20   Wurst, [place], PA-C  Multiple Vitamins-Calcium (ONE-A-DAY WOMENS FORMULA PO) Take by mouth.    [provider]  predniSONE (DELTASONE) 10 MG tablet Take 2 tablets (20 mg total) by mouth daily. 05/10/20   AvegnoDarrelyn Hillock, FNP    Family History Family History  Problem Relation Age of Onset  . Autism Son   . Diabetes Maternal Grandmother   . Stroke Maternal Grandmother   . Heart failure Maternal Grandmother   . Stroke Maternal Grandfather   . Stroke Father   . Seizures Father   . Allergies Father        penicillin  . Heart attack Father   . Other Mother        herpes  . Hyperlipidemia Mother   . Heart failure Paternal Grandfather   . Diabetes Paternal Grandmother   . Hypertension Paternal Grandmother   . Autism Brother   . Other Paternal Uncle        heart exploded  . Cancer Maternal Aunt   . Diabetes Maternal Aunt   . Hypertension Maternal Aunt   . Immunodeficiency Neg Hx   . Urticaria Neg Hx   . Eczema Neg Hx   . Atopy Neg Hx   . Asthma Neg Hx   . Angioedema Neg Hx   . Allergic rhinitis Neg Hx     Social History Social History   Tobacco Use  . Smoking status: Current Every Day Smoker    Packs/day: 0.50    Types: Cigarettes  . Smokeless tobacco: Never Used  Vaping Use  . Vaping Use: Never used  Substance Use Topics  . Alcohol use: No  . Drug use: No     Allergies   Doxycycline, Wellbutrin [bupropion], Catfish [fish allergy], and Tape   Review of Systems Review of Systems   Physical Exam Triage Vital  Signs ED Triage Vitals [08/04/20 1103]  Enc Vitals Group     BP 116/73     Pulse Rate 85     Resp 18     Temp 98.6 F (37 C)     Temp Source Oral     SpO2  97 %     Weight 150 lb (68 kg)     Height 5' 2.5" (1.588 m)     Head Circumference      Peak Flow      Pain Score 7     Pain Loc      Pain Edu?      Excl. in [place]?    No data found.  Updated Vital Signs BP 116/73 (BP Location: Right Arm)   Pulse 85   Temp 98.6 F (37 C) (Oral)   Resp 18   Ht 5' 2.5" (1.588 m)   Wt 150 lb (68 kg)   LMP 07/29/2020   SpO2 97%   BMI 27.00 kg/m     Physical Exam Vitals and nursing note reviewed.  Constitutional:      General: She is not in acute distress.    Appearance: She is well-developed and well-nourished.  HENT:     Head: Normocephalic and atraumatic.  Eyes:     Conjunctiva/sclera: Conjunctivae normal.  Cardiovascular:     Rate and Rhythm: Normal rate and regular rhythm.     Heart sounds: Normal heart sounds. No murmur heard.   Pulmonary:     Effort: Pulmonary effort is normal. No respiratory distress.     Breath sounds: Normal breath sounds. No stridor. No wheezing, rhonchi or rales.  Chest:     Chest wall: No tenderness.  Abdominal:     Palpations: Abdomen is soft.     Tenderness: There is no abdominal tenderness.  Musculoskeletal:        General: No edema. Normal range of motion.     Cervical back: Normal range of motion and neck supple.  Skin:    General: Skin is warm and dry.     Capillary Refill: Capillary refill takes less than 2 seconds.  Neurological:     General: No focal deficit present.     Mental Status: She is alert and oriented to person, place, and time.  Psychiatric:        Mood and Affect: Mood and affect and mood normal.        Behavior: Behavior normal.        Thought Content: Thought content normal.      UC Treatments / Results  Labs (all labs ordered are listed, but only abnormal results are displayed) Labs Reviewed - No data to  display  EKG   Radiology No results found.  Procedures Procedures (including critical care time)  Medications Ordered in UC Medications - No data to display  Initial Impression / Assessment and Plan / UC Course  I have reviewed the triage vital signs and the nursing notes.  Pertinent labs & imaging results that were available during my care of the patient were reviewed by me and considered in my medical decision making (see chart for details).    Headache Episode of LOC  Discussed with patient that given her exam and vital signs in the office today I do not see an immediate cause for her loss of consciousness yesterday Discussed that she would be best served in the ER where they could do labs quickly and get a CT of her head Verbalized understanding and is in agreement with treatment plan To ER via private vehicle (patient did not drive herself today)  Final Clinical Impressions(s) / UC Diagnoses   Final diagnoses:  Episode of loss of consciousness  Nonintractable headache, unspecified chronicity pattern, unspecified headache type   Discharge Instructions   None  ED Prescriptions    None     PDMP not reviewed this encounter.   Faustino Congress, NP 08/06/20 1015

## 2020-08-04 NOTE — ED Provider Notes (Signed)
On [facility] Provider Note   CSN: [id number] Arrival date & time: 08/04/20  1140     History No chief complaint on file.   Jaime Flores is a 35 y.o. female who presents at the prompting of her urgent care provider for concern of posterior headache and episode of syncope yesterday.  Patient states that she has had a dull aching headache to the back of her head that started yesterday, with intermittent episodes of tingling/ shooting pain into her head as well. She states that yesterday her boyfriend brought her home from work at lunchtime; he dropped her off.  When she was walking to her bedroom she states that she believes she passed out, she woke up on the floor, and subsequently got in her bed and slept for several hours.  She states she had a similar episode in 2012, however she was not evaluated by medical provider at that time. She also states that when she passed out in 2012 "I never lost consciousness".   She denies any chest pain or palpitations denies shortness of breath, abdominal pain, nausea or vomiting, diarrhea.  Denies blurry vision, double vision, lightheadedness, dizziness.  She endorses sensation of fatigue and whole body weakness.  She denies fevers or chills at home.  Denies known sick contacts.  Patient is not vaccinated against COVID-19.  I have personally reviewed this patient's medical records.  She has a history of irregular menstrual cycles, hemorrhoids, anxiety and depression prediabetes.  She is on Celexa daily for anxiety.  LMP 07/29/20.  Patient denies any recent new medications.  She states she does smoke approximately 1 pack of cigarettes daily, denies alcohol or illicit drug use. Denies any recent prolonged travel or immobilization, denies hx of malignancy or clot, denies hormone replacement.   HPI     Past Medical History:  Diagnosis Date  . Amenorrhea 01/27/2013  . Angio-edema   . Breast pain 03/10/2015  . Encounter for  menstrual regulation 07/05/2015  . Irregular intermenstrual bleeding 07/28/2015  . Irregular periods 07/05/2015  . Screening for STD (sexually transmitted disease) 08/07/2013  . Urticaria   . UTI (urinary tract infection)   . Vaginal discharge 06/10/2013    Patient Active Problem List   Diagnosis Date Noted  . Prediabetes 02/15/2020  . 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 Anxiety 11/18/2019  . Depression, recurrent ([place]) 11/18/2019  . Encounter for gynecological examination with Papanicolaou smear of cervix 06/03/2019  . 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 Cough 05/15/2019  . Seasonal and perennial allergic rhinitis 05/15/2019  . Bug bite 02/06/2019  . Hemorrhoids 09/26/2016  . Irregular intermenstrual bleeding 07/28/2015  . Amenorrhea 01/27/2013    Past Surgical History:  Procedure Laterality Date  . APPENDECTOMY    . CHOLECYSTECTOMY    . TUBAL LIGATION       OB History    Gravida  2   Para  2   Term  1   Preterm  1   AB      Living  2     SAB      IAB      Ectopic      Multiple      Live Births              Family History  Problem Relation Age of Onset  . Autism Son   . Diabetes Maternal Grandmother   . Stroke Maternal Grandmother   . Heart failure Maternal Grandmother   .  Stroke Maternal Grandfather   . Stroke Father   . Seizures Father   . Allergies Father        penicillin  . Heart attack Father   . Other Mother        herpes  . Hyperlipidemia Mother   . Heart failure Paternal Grandfather   . Diabetes Paternal Grandmother   . Hypertension Paternal Grandmother   . Autism Brother   . Other Paternal Uncle        heart exploded  . Cancer Maternal Aunt   . Diabetes Maternal Aunt   . Hypertension Maternal Aunt   . Immunodeficiency Neg Hx   . Urticaria Neg Hx   . Eczema Neg Hx   . Atopy Neg Hx   . Asthma Neg Hx   . Angioedema Neg Hx   . Allergic rhinitis Neg Hx     Social History   Tobacco Use  .  Smoking status: Current Every Day Smoker    Packs/day: 0.50    Types: Cigarettes  . Smokeless tobacco: Never Used  Vaping Use  . Vaping Use: Never used  Substance Use Topics  . Alcohol use: No  . Drug use: No    Home Medications Prior to Admission medications   Medication Sig Start Date End Date Taking? Authorizing Provider  albuterol (VENTOLIN HFA) 108 (90 Base) MCG/ACT inhaler Use 4 puffs every 4-6 hours as needed for cough or wheeze. 05/15/19   Valentina Shaggy, MD  ARIPiprazole (ABILIFY) 10 MG tablet Take 1 tablet (10 mg total) by mouth daily. 01/18/20   [name], Fransisca Kaufmann, MD  azithromycin (ZITHROMAX) 250 MG tablet Take 1 tablet (250 mg total) by mouth daily. Take first 2 tablets together, then 1 every day until finished. 05/10/20   Avegno, Darrelyn Hillock, FNP  benzonatate (TESSALON) 100 MG capsule Take 1 capsule (100 mg total) by mouth every 8 (eight) hours. 05/10/20   Avegno, Darrelyn Hillock, FNP  Blood Glucose Monitoring Suppl (ACCU-CHEK GUIDE) w/Device KIT 1 Piece by Does not apply route as directed. 02/15/20   Cassandria Anger, MD  cetirizine (ZYRTEC) 10 MG tablet Take 1 tablet (10 mg total) by mouth daily. 11/13/19 12/13/19  Valentina Shaggy, MD  citalopram (CELEXA) 40 MG tablet Take 1 tablet (40 mg total) by mouth daily. 12/14/19   [name], Fransisca Kaufmann, MD  cyclobenzaprine (FLEXERIL) 5 MG tablet Take 1 tablet (5 mg total) by mouth 3 (three) times daily as needed for muscle spasms. Patient not taking: No sig reported 10/08/19   Chevis Pretty, FNP  diphenhydrAMINE (BENADRYL) 25 MG tablet Take 25 mg by mouth every 6 (six) hours as needed.    [provider]  EPINEPHrine 0.3 mg/0.3 mL IJ SOAJ injection Inject 0.3 mLs (0.3 mg total) into the muscle as needed for anaphylaxis. 11/13/19   Valentina Shaggy, MD  fluticasone [facility]) 50 MCG/ACT nasal spray Place 2 sprays into both nostrils daily. 11/30/19   Wurst, [place], PA-C  glucose blood (ACCU-CHEK GUIDE) test  strip Use as instructed 2-3 x daily. E11.65 02/15/20   Cassandria Anger, MD  ibuprofen (ADVIL) 800 MG tablet Take 1 tablet (800 mg total) by mouth 3 (three) times daily. Take with food 09/24/19   Avegno, Darrelyn Hillock, FNP  meloxicam (MOBIC) 15 MG tablet Take 1 tablet (15 mg total) by mouth daily. 04/23/20   Wurst, [place], PA-C  Multiple Vitamins-Calcium (ONE-A-DAY WOMENS FORMULA PO) Take by mouth.    [provider]  predniSONE (DELTASONE) 10 MG  tablet Take 2 tablets (20 mg total) by mouth daily. 05/10/20   Avegno, Darrelyn Hillock, FNP    Allergies    Doxycycline, Wellbutrin [bupropion], Catfish [fish allergy], and Tape  Review of Systems   Review of Systems  Constitutional: Positive for activity change and fatigue. Negative for appetite change, chills, diaphoresis and fever.  HENT: Negative.   Eyes: Negative.  Negative for visual disturbance.  Respiratory: Negative.   Cardiovascular: Negative.   Gastrointestinal: Negative.   Genitourinary: Negative.   Musculoskeletal: Negative.   Skin: Negative.   Neurological: Positive for syncope, weakness and headaches. Negative for dizziness, tremors, seizures, facial asymmetry, speech difficulty, light-headedness and numbness.  Hematological: Negative.   Psychiatric/Behavioral: Negative.     Physical Exam Updated Vital Signs BP 115/84   Pulse 62   Temp 98.1 F (36.7 C) (Oral)   Resp 18   Ht _0  (1.575 m)   Wt 68 kg   LMP 07/29/2020   SpO2 100%   BMI 27.44 kg/m   Physical Exam Vitals and nursing note reviewed.  Constitutional:      General: She is awake.     Appearance: She is overweight.  HENT:     Head: Normocephalic and atraumatic.     Right Ear: External ear normal.     Left Ear: External ear normal.     Nose: Nose normal.     Mouth/Throat:     Mouth: Mucous membranes are moist.     Dentition: Abnormal dentition. Dental caries present.     Pharynx: Oropharynx is clear. Uvula midline. No oropharyngeal exudate or  posterior oropharyngeal erythema.     Tonsils: No tonsillar exudate or tonsillar abscesses.  Eyes:     General: Lids are normal. Vision grossly intact.        Right eye: No discharge.        Left eye: No discharge.     Extraocular Movements: Extraocular movements intact.     Conjunctiva/sclera: Conjunctivae normal.     Pupils: Pupils are equal, round, and reactive to light.     Visual Fields: Right eye visual fields normal and left eye visual fields normal.  Neck:     Trachea: Trachea and phonation normal.  Cardiovascular:     Rate and Rhythm: Normal rate and regular rhythm.     Chest Wall: PMI is not displaced.     Pulses: Normal pulses.          Radial pulses are 2+ on the right side and 2+ on the left side.       Dorsalis pedis pulses are 2+ on the right side and 2+ on the left side.     Heart sounds: Normal heart sounds. No murmur heard.   Pulmonary:     Effort: Pulmonary effort is normal. No accessory muscle usage, prolonged expiration, respiratory distress or retractions.     Breath sounds: Normal breath sounds. No wheezing or rales.  Chest:     Chest wall: No lacerations, deformity, swelling, tenderness, crepitus or edema.  Abdominal:     General: Bowel sounds are normal. There is no distension.     Palpations: Abdomen is soft.     Tenderness: There is no abdominal tenderness.  Musculoskeletal:        General: No deformity.     Cervical back: Neck supple. Spasms and tenderness present. No rigidity, torticollis, bony tenderness or crepitus. No pain with movement, spinous process tenderness or muscular tenderness.     Thoracic back: Spasms present.  Lumbar back: Normal.       Back:     Right lower leg: No edema.     Left lower leg: No edema.     Comments: 5/5 grip strength bilaterally 5/5 strength in plantar and dorsiflexion bilaterally FROM shoulders, elbows, wrists, as well as the hips, knees, ankles  Lymphadenopathy:     Cervical: No cervical adenopathy.   Skin:    General: Skin is warm and dry.     Capillary Refill: Capillary refill takes less than 2 seconds.  Neurological:     General: No focal deficit present.     Mental Status: She is alert and oriented to person, place, and time. Mental status is at baseline.     Cranial Nerves: Cranial nerves are intact.     Sensory: Sensation is intact.     Motor: Motor function is intact.     Coordination: Coordination is intact.     Gait: Gait is intact.  Psychiatric:        Mood and Affect: Mood and affect normal.        Speech: Speech normal.        Behavior: Behavior is cooperative.     ED Results / Procedures / Treatments   Labs (all labs ordered are listed, but only abnormal results are displayed) Labs Reviewed  BASIC METABOLIC PANEL - Abnormal; Notable for the following components:      Result Value   Potassium 3.1 (*)    BUN 5 (*)    All other components within normal limits  URINALYSIS, ROUTINE W REFLEX MICROSCOPIC - Abnormal; Notable for the following components:   APPearance HAZY (*)    Hgb urine dipstick SMALL (*)    Ketones, ur 5 (*)    Protein, ur 30 (*)    Leukocytes,Ua MODERATE (*)    Bacteria, UA RARE (*)    All other components within normal limits  SARS CORONAVIRUS 2 (TAT 6-24 HRS)  CBC  PREGNANCY, URINE  MAGNESIUM  TROPONIN I (HIGH SENSITIVITY)    EKG EKG: normal sinus rhythm, nonspecificT waves changes, no STEMI.  Radiology CT Head Wo Contrast  Result Date: 08/04/2020 CLINICAL DATA:  Syncopal episode. Burning sensation in the back of the head. EXAM: CT HEAD WITHOUT CONTRAST TECHNIQUE: Contiguous axial images were obtained from the base of the skull through the vertex without intravenous contrast. COMPARISON:  None. FINDINGS: Brain: The brain shows a normal appearance without evidence of malformation, atrophy, old or acute small or large vessel infarction, mass lesion, hemorrhage, hydrocephalus or extra-axial collection. Vascular: No hyperdense vessel. No  evidence of atherosclerotic calcification. Skull: Normal.  No traumatic finding.  No focal bone lesion. Sinuses/Orbits: Sinuses are clear. Orbits appear normal. Mastoids are clear. Other: None significant IMPRESSION: Normal head CT. Electronically Signed   By: Nelson Chimes M.D.   On: 08/04/2020 15:20   DG Chest Portable 1 View  Result Date: 08/04/2020 CLINICAL DATA:  Syncopal episode today. EXAM: PORTABLE CHEST 1 VIEW COMPARISON:  07/29/2019 FINDINGS: Cardiac silhouette is normal in size. Normal mediastinal and hilar contours. Clear lungs.  No pleural effusion or pneumothorax. Skeletal structures are grossly intact. IMPRESSION: No active disease. Electronically Signed   By: Lajean Manes M.D.   On: 08/04/2020 15:11    Procedures Procedures (including critical care time)  Medications Ordered in ED Medications  ibuprofen (ADVIL) tablet 800 mg (800 mg Oral Given 08/04/20 1444)  oxyCODONE (Oxy IR/ROXICODONE) immediate release tablet 5 mg (5 mg Oral Given 08/04/20 1646)  ED Course  I have reviewed the triage vital signs and the nursing notes.  Pertinent labs & imaging results that were available during my care of the patient were reviewed by me and considered in my medical decision making (see chart for details).    MDM Rules/Calculators/A&P                         35 year old female presents with concern for posterior headache and episode of syncope yesterday.  Patient is not vaccinated against COVID-19.  The differential for syncope is extensive and includes, but is not limited to: arrythmia (Vtach, SVT, SSS, sinus arrest, AV block, bradycardia), aortic stenosis, AMI, hypertrophic obstructive cardiomyopathy (HOCM), PE, atrial myxoma, pulmonary hypertension, orthostatic hypotension, hypovolemia, drug effect, GB syndrome, micturition, cough, carotid sinus sensitivity, seizure, TIA/CVA, hypoglycemia, vertigo, vasovagal.  This patient's vital signs were normal on intake.  She is not hypotensive,  hypoxic, tachycardic, or febrile.  Physical exam is very reassuring.  Cardiopulmonary exam is unremarkable.  Neurologic exam is without focal deficit, very reassuring.  Patient is neurovascularly intact in all 4 extremities, is able to ambulate independently to and from the restroom without difficulty.  There is spasm of the trapezius bilaterally, as well as the cervical paraspinous musculature, with associated tenderness to palpation.  Will proceed with basic laboratory studies, EKG, chest x-ray, and CT head.  Additionally we will proceed with send out COVID-19 testing given patient's unvaccinated status.  Analgesia offered.  BMP with hypokalemia to 3.1.  Troponin normal,<2.  Magnesium is normal.  The patient is not pregnant.  UA without clear signs of infection, with small amount of hemoglobin, moderate leukocytes, rare bacteria; patient is at the end of her menstrual cycle.  Chest x-ray was negative for acute cardiopulmonary disease.  CT head was negative for acute intracranial abnormality.  EKG was very reassuring, normal sinus rhythm without STEMI.  At time of my reevaluation the patient she endorses significant improvement in her headache after administration of ibuprofen and oxycodone.  Given reassuring physical exam, vital signs, laboratory and imaging studies, and improvement in patient's symptoms, no further work-up is warranted in the emerged department at this time.  Suspect that the patient's headache is secondary to muscular tension in the neck and the shoulders.  Recommend heat application and massage.  Patient may continue to utilize ibuprofen as needed at home for symptoms.   Regarding her syncopal episode yesterday, there does not appear to be an emergent cause that was identifiable during today's emergency department evaluation. Recommend close follow-up with her primary care doctor for reevaluation.  She may follow-up on her COVID-19 test results in [place].  COVID precautions were  given.  Laelyn voiced understanding of her medical evaluation and treatment plan.  Each of her questions was answered to her expressed satisfaction.  Return precautions given.  Patient is well-appearing, stable, and appropriate for discharge at this time.  Jaime Flores was evaluated in Emergency Department on 08/04/2020 for the symptoms described in the history of present illness. She was evaluated in the context of the global COVID-19 pandemic, which necessitated consideration that the patient might be at risk for infection with the SARS-CoV-2 virus that causes COVID-19. Institutional protocols and algorithms that pertain to the evaluation of patients at risk for COVID-19 are in a state of rapid change based on information released by regulatory bodies including the CDC and federal and state organizations. These policies and algorithms were followed during the patient's care in  the ED.  This chart was dictated using voice recognition software, Dragon. Despite the best efforts of this provider to proofread and correct errors, errors may still occur which can change documentation meaning.  Final Clinical Impression(s) / ED Diagnoses Final diagnoses:  Acute non intractable tension-type headache  Syncope, unspecified syncope type    Rx / DC Orders ED Discharge Orders    None       Aura Dials 08/04/20 1754    Milton Demir, MD 08/05/20 [phone]

## 2020-08-04 NOTE — ED Notes (Signed)
Patient is being sent to the ED.  Kirkland Hun evaluated patient and advised pt to go to the [facility] ED.  Angelina Pih notified of pt coming.

## 2020-08-04 NOTE — ED Triage Notes (Signed)
States she feel like she has a burning sensation at the back of her head and states "something feels off"  States she passed out yesterday.  States she has a similar episode back in 2012 and was not seen by a doctor.

## 2020-08-04 NOTE — ED Triage Notes (Signed)
Pt sent by urgent care due burning sensation at the back of head for 2 days. Reports that she passed out yesterday. Sent here for CT and labs

## 2020-08-04 NOTE — Discharge Instructions (Addendum)
You were evaluated today in the emergency department for your headache and your episode of syncope yesterday. Your vital signs, physical exam, blood work, EKG, and imaging studies were very reassuring.  There does not appear to be any emergent cause for your symptoms.  You may continue to take ibuprofen at home for your headache as needed.  Recommend you follow-up closely with your primary care doctor for reevaluation next week.  You have been tested for COVID-19.  Your test results should return within 24 hours.  You may follow your results on MyChart.  Return to the emergency department if develop any worsening headache, blurry vision, double vision, if you pass out again, if you develop any chest pain, difficulty breathing, palpitations or any other new severe symptoms.

## 2020-08-05 ENCOUNTER — Telehealth: Payer: Self-pay

## 2020-08-05 LAB — SARS CORONAVIRUS 2 (TAT 6-24 HRS): SARS Coronavirus 2: NEGATIVE

## 2020-08-05 NOTE — Telephone Encounter (Signed)
Transition Care Management Unsuccessful Follow-up Telephone Call  Date of discharge and from where:  08/04/2020 [facility]   Attempts:  1st Attempt  Reason for unsuccessful TCM follow-up call:  Left voice message

## 2020-08-08 NOTE — Telephone Encounter (Signed)
Transition Care Management Follow-up Telephone Call  Date of discharge and from where: 08/04/2020 [facility]  How have you been since you were released from the hospital? Doing a lot better, still having some headaches but has not passed out since going to the ED.   Any questions or concerns? No  Items Reviewed:  Did the pt receive and understand the discharge instructions provided? Yes   Medications obtained and verified? No medications ordered.  Other? No   Any new allergies since your discharge? No   Dietary orders reviewed? Yes  Do you have support at home? Yes    Functional Questionnaire: (I = Independent and D = Dependent) ADLs: I  Bathing/Dressing- I  Meal Prep- I  Eating- I  Maintaining continence- I  Transferring/Ambulation- I  Managing Meds- I  Follow up appointments reviewed:   PCP Hospital f/u appt confirmed? No  Patient stated she will wait about a week or so before contacting PCP to see if her headaches continue.  [facility] f/u appt confirmed? No    Are transportation arrangements needed? No   If their condition worsens, is the pt aware to call PCP or go to the Emergency Dept.? Yes  Was the patient provided with contact information for the PCP's office or ED? Yes  Was to pt encouraged to call back with questions or concerns? Yes

## 2020-08-10 DIAGNOSIS — R55 Syncope and collapse: Secondary | ICD-10-CM | POA: Diagnosis not present

## 2020-08-10 DIAGNOSIS — R4020 Unspecified coma: Secondary | ICD-10-CM | POA: Diagnosis not present

## 2020-08-12 ENCOUNTER — Encounter: Payer: Self-pay | Admitting: Family Medicine

## 2020-08-12 ENCOUNTER — Other Ambulatory Visit: Payer: Self-pay

## 2020-08-12 ENCOUNTER — Ambulatory Visit (INDEPENDENT_AMBULATORY_CARE_PROVIDER_SITE_OTHER): Payer: Medicaid Other | Admitting: Family Medicine

## 2020-08-12 VITALS — BP 117/71 | HR 78 | Ht 62.0 in | Wt 155.5 lb

## 2020-08-12 DIAGNOSIS — R5383 Other fatigue: Secondary | ICD-10-CM | POA: Diagnosis not present

## 2020-08-12 DIAGNOSIS — R6889 Other general symptoms and signs: Secondary | ICD-10-CM | POA: Diagnosis not present

## 2020-08-12 DIAGNOSIS — R55 Syncope and collapse: Secondary | ICD-10-CM | POA: Diagnosis not present

## 2020-08-12 LAB — URINALYSIS, COMPLETE
Bilirubin, UA: NEGATIVE
Glucose, UA: NEGATIVE
Ketones, UA: NEGATIVE
Nitrite, UA: NEGATIVE
Protein,UA: NEGATIVE
Specific Gravity, UA: 1.01 (ref 1.005–1.030)
Urobilinogen, Ur: 0.2 mg/dL (ref 0.2–1.0)
pH, UA: 6.5 (ref 5.0–7.5)

## 2020-08-12 LAB — MICROSCOPIC EXAMINATION
Epithelial Cells (non renal): NONE SEEN /hpf (ref 0–10)
RBC, Urine: NONE SEEN /hpf (ref 0–2)

## 2020-08-12 LAB — PREGNANCY, URINE: Preg Test, Ur: NEGATIVE

## 2020-08-12 NOTE — Progress Notes (Signed)
BP 117/71   Pulse 78   Ht _0  (1.575 m)   Wt 155 lb 8 oz (70.5 kg)   LMP 07/29/2020   SpO2 96%   BMI 28.44 kg/m    Subjective:   Patient ID: Jaime Flores, female    DOB: 28-Oct-1985, 35 y.o.   MRN: [id number]  HPI: Jaime Flores is a 35 y.o. female presenting on 08/12/2020 for Medical Management of Chronic Issues and Loss of Consciousness   HPI Patient has had loss of consciousness x5 the past 2 weeks.  She has had these episodes on multiple occasions but will have couple of them have been witnessed.  On the first occasion that was witnessed by her boyfriend she was at work and she ate and then use the restroom and as she was coming back out of the restroom her boyfriend was talking to her and she was standing there and then her face went white per him and then she was staring at him for a period of time and then after that she slumped over and he caught her.  He does say that the blank stare happened for a time.  But did not know how long it was because it was relatively quick, likely less than 30 seconds or a minute but they did not know for sure.  And then when she did start coming to he said that she was very confused and talking nonsense for a couple of minutes and then it passed.  She has had an episode happened while she was sitting down and another 1 while she was standing up.  She is never fully hit the floor too hard.  She says she will feel a tingling and numbness in her arms and her legs and a headache in the back of her head after for a few minutes and then that will pass as well.  When she is not having 1 of these episodes she is perfectly coherent and acting normally and fine.  She does have a family history of seizures.  She is also complained of some fatigue over the past couple weeks.  She gets the headaches after these episodes but not before.   She also says that she gets palpitations or flutters afterwards as well.  Relevant past medical, surgical, family and  social history reviewed and updated as indicated. Interim medical history since our last visit reviewed. Allergies and medications reviewed and updated.  Review of Systems  Constitutional: Positive for fatigue. Negative for chills and fever.  Eyes: Negative for photophobia and visual disturbance.  Respiratory: Negative for chest tightness and shortness of breath.   Cardiovascular: Negative for chest pain and leg swelling.  Genitourinary: Negative for difficulty urinating and dysuria.  Musculoskeletal: Negative for back pain and gait problem.  Skin: Negative for rash.  Neurological: Positive for dizziness, syncope, light-headedness, numbness and headaches. Negative for speech difficulty and weakness.  Psychiatric/Behavioral: Negative for agitation and behavioral problems.  All other systems reviewed and are negative.   Per HPI unless specifically indicated above   Allergies as of 08/12/2020      Reactions   Doxycycline Shortness Of Breath   Wellbutrin [bupropion] Shortness Of Breath, Nausea Only, Palpitations   Catfish [fish Allergy]    Tape Swelling   Plastic tape please use paper      Medication List       Accurate as of August 12, 2020  3:46 PM. If you have any questions, ask your nurse or  doctor.        STOP taking these medications   ARIPiprazole 10 MG tablet Commonly known as: Abilify Stopped by: [name], MD   azithromycin 250 MG tablet Commonly known as: ZITHROMAX Stopped by: Fransisca Kaufmann Venie Montesinos, MD   benzonatate 100 MG capsule Commonly known as: TESSALON Stopped by: [name], MD   cetirizine 10 MG tablet Commonly known as: ZYRTEC Stopped by: [name], MD   diphenhydrAMINE 25 MG tablet Commonly known as: BENADRYL Stopped by: [name], MD   ibuprofen 800 MG tablet Commonly known as: ADVIL Stopped by: [name], MD   meloxicam 15 MG tablet Commonly known as: Mobic Stopped by: [name], MD    ONE-A-DAY WOMENS FORMULA PO Stopped by: Fransisca Kaufmann Lutisha Knoche, MD   predniSONE 10 MG tablet Commonly known as: DELTASONE Stopped by: [name], MD     TAKE these medications   Accu-Chek Guide test strip Generic drug: glucose blood Use as instructed 2-3 x daily. E11.65   Accu-Chek Guide w/Device Kit 1 Piece by Does not apply route as directed.   albuterol 108 (90 Base) MCG/ACT inhaler Commonly known as: VENTOLIN HFA Use 4 puffs every 4-6 hours as needed for cough or wheeze.   citalopram 40 MG tablet Commonly known as: CELEXA Take 1 tablet (40 mg total) by mouth daily.   cyclobenzaprine 5 MG tablet Commonly known as: FLEXERIL Take 1 tablet (5 mg total) by mouth 3 (three) times daily as needed for muscle spasms.   EPINEPHrine 0.3 mg/0.3 mL Soaj injection Commonly known as: EPI-PEN Inject 0.3 mLs (0.3 mg total) into the muscle as needed for anaphylaxis.   fluticasone 50 MCG/ACT nasal spray Commonly known as: FLONASE Place 2 sprays into both nostrils daily.        Objective:   BP 117/71   Pulse 78   Ht _0  (1.575 m)   Wt 155 lb 8 oz (70.5 kg)   LMP 07/29/2020   SpO2 96%   BMI 28.44 kg/m   Wt Readings from Last 3 Encounters:  08/12/20 155 lb 8 oz (70.5 kg)  08/04/20 150 lb (68 kg)  08/04/20 150 lb (68 kg)    Physical Exam Vitals and nursing note reviewed.  Constitutional:      General: She is not in acute distress.    Appearance: She is well-developed and well-nourished. She is not diaphoretic.  Eyes:     Extraocular Movements: Extraocular movements intact and EOM normal.     Conjunctiva/sclera: Conjunctivae normal.     Pupils: Pupils are equal, round, and reactive to light.  Cardiovascular:     Rate and Rhythm: Normal rate and regular rhythm.     Pulses: Intact distal pulses.     Heart sounds: Normal heart sounds. No murmur heard.   Pulmonary:     Effort: Pulmonary effort is normal. No respiratory distress.     Breath sounds: Normal breath  sounds. No wheezing.  Musculoskeletal:        General: No tenderness or edema. Normal range of motion.  Skin:    General: Skin is warm and dry.     Findings: No rash.  Neurological:     Mental Status: She is alert and oriented to person, place, and time.     Cranial Nerves: No cranial nerve deficit.     Sensory: No sensory deficit.     Motor: No weakness.     Coordination: Coordination normal.  Gait: Gait normal.     Deep Tendon Reflexes: Reflexes normal.  Psychiatric:        Mood and Affect: Mood and affect normal.        Behavior: Behavior normal.       Assessment & Plan:   Problem List Items Addressed This Visit   None   Visit Diagnoses    Syncope, unspecified syncope type    -  Primary   Relevant Orders   CBC with Differential/Platelet   CMP14+EGFR   TSH   VITAMIN D 25 Hydroxy (Vit-D Deficiency, Fractures)   Anemia Profile B   Holter monitor - 24 hour   Urinalysis, Complete   Pregnancy, urine   Other fatigue       Relevant Orders   CBC with Differential/Platelet   CMP14+EGFR   TSH   VITAMIN D 25 Hydroxy (Vit-D Deficiency, Fractures)   Anemia Profile B   Holter monitor - 24 hour   Urinalysis, Complete   Pregnancy, urine      Patient has syncopal episodes x5 over the past couple weeks, there is no premonition of them coming on, she says she gets a blank stare and then she will be like that and then she will go down, there is a possibility that it could be related to absence seizures, also concerned about possible cardiac dysrhythmia, will put her on a Holter monitor and do some blood work, if all the blood work comes back fine then may consider doing neurological referral. Follow up plan: Return if symptoms worsen or fail to improve.  Counseling provided for all of the vaccine components Orders Placed This Encounter  Procedures  . CBC with Differential/Platelet  . CMP14+EGFR  . TSH  . VITAMIN D 25 Hydroxy (Vit-D Deficiency, Fractures)  . Anemia  Profile B  . Urinalysis, Complete  . Pregnancy, urine  . Holter monitor - 24 hour    Caryl Pina, MD [facility] 08/12/2020, 3:46 PM

## 2020-08-13 LAB — ANEMIA PROFILE B
Basophils Absolute: 0.1 10*3/uL (ref 0.0–0.2)
Basos: 1 %
EOS (ABSOLUTE): 0.1 10*3/uL (ref 0.0–0.4)
Eos: 1 %
Ferritin: 46 ng/mL (ref 15–150)
Folate: 2.4 ng/mL — ABNORMAL LOW (ref >3.0)
Hematocrit: 43.4 % (ref 34.0–46.6)
Hemoglobin: 13.9 g/dL (ref 11.1–15.9)
Immature Grans (Abs): 0 10*3/uL (ref 0.0–0.1)
Immature Granulocytes: 0 %
Iron Saturation: 10 % — ABNORMAL LOW (ref 15–55)
Iron: 35 ug/dL (ref 27–159)
Lymphocytes Absolute: 2.8 10*3/uL (ref 0.7–3.1)
Lymphs: 27 %
MCH: 29.2 pg (ref 26.6–33.0)
MCHC: 32 g/dL (ref 31.5–35.7)
MCV: 91 fL (ref 79–97)
Monocytes Absolute: 0.5 10*3/uL (ref 0.1–0.9)
Monocytes: 5 %
Neutrophils Absolute: 7 10*3/uL (ref 1.4–7.0)
Neutrophils: 66 %
Platelets: 367 10*3/uL (ref 150–450)
RBC: 4.76 x10E6/uL (ref 3.77–5.28)
RDW: 13.3 % (ref 11.7–15.4)
Retic Ct Pct: 1 % (ref 0.6–2.6)
Total Iron Binding Capacity: 334 ug/dL (ref 250–450)
UIBC: 299 ug/dL (ref 131–425)
Vitamin B-12: 293 pg/mL (ref 232–1245)
WBC: 10.5 10*3/uL (ref 3.4–10.8)

## 2020-08-13 LAB — CMP14+EGFR
ALT: 21 IU/L (ref 0–32)
AST: 23 IU/L (ref 0–40)
Albumin/Globulin Ratio: 1.9 (ref 1.2–2.2)
Albumin: 4.6 g/dL (ref 3.8–4.8)
Alkaline Phosphatase: 144 IU/L — ABNORMAL HIGH (ref 44–121)
BUN/Creatinine Ratio: 6 — ABNORMAL LOW (ref 9–23)
BUN: 5 mg/dL — ABNORMAL LOW (ref 6–20)
Bilirubin Total: 0.2 mg/dL (ref 0.0–1.2)
CO2: 30 mmol/L — ABNORMAL HIGH (ref 20–29)
Calcium: 9.7 mg/dL (ref 8.7–10.2)
Chloride: 98 mmol/L (ref 96–106)
Creatinine, Ser: 0.8 mg/dL (ref 0.57–1.00)
GFR calc Af Amer: 113 mL/min/{1.73_m2} (ref >59)
GFR calc non Af Amer: 98 mL/min/{1.73_m2} (ref >59)
Globulin, Total: 2.4 g/dL (ref 1.5–4.5)
Glucose: 71 mg/dL (ref 65–99)
Potassium: 3.2 mmol/L — ABNORMAL LOW (ref 3.5–5.2)
Sodium: 141 mmol/L (ref 134–144)
Total Protein: 7 g/dL (ref 6.0–8.5)

## 2020-08-13 LAB — TSH: TSH: 1.44 u[IU]/mL (ref 0.450–4.500)

## 2020-08-13 LAB — VITAMIN D 25 HYDROXY (VIT D DEFICIENCY, FRACTURES): Vit D, 25-Hydroxy: 19.4 ng/mL — ABNORMAL LOW (ref 30.0–100.0)

## 2020-08-17 ENCOUNTER — Other Ambulatory Visit: Payer: Self-pay

## 2020-08-17 ENCOUNTER — Encounter (HOSPITAL_COMMUNITY): Payer: Self-pay

## 2020-08-17 ENCOUNTER — Emergency Department (HOSPITAL_COMMUNITY)
Admission: EM | Admit: 2020-08-17 | Discharge: 2020-08-17 | Disposition: A | Discharge status: Home / Self Care | Payer: Medicaid Other | Attending: Emergency Medicine | Admitting: Emergency Medicine

## 2020-08-17 ENCOUNTER — Encounter: Payer: Self-pay | Admitting: Family Medicine

## 2020-08-17 ENCOUNTER — Ambulatory Visit: Payer: Medicaid Other | Admitting: *Deleted

## 2020-08-17 DIAGNOSIS — R55 Syncope and collapse: Secondary | ICD-10-CM

## 2020-08-17 DIAGNOSIS — I10 Essential (primary) hypertension: Secondary | ICD-10-CM | POA: Diagnosis not present

## 2020-08-17 DIAGNOSIS — R569 Unspecified convulsions: Secondary | ICD-10-CM | POA: Insufficient documentation

## 2020-08-17 DIAGNOSIS — F17210 Nicotine dependence, cigarettes, uncomplicated: Secondary | ICD-10-CM | POA: Insufficient documentation

## 2020-08-17 DIAGNOSIS — R0689 Other abnormalities of breathing: Secondary | ICD-10-CM | POA: Diagnosis not present

## 2020-08-17 DIAGNOSIS — R0902 Hypoxemia: Secondary | ICD-10-CM | POA: Diagnosis not present

## 2020-08-17 DIAGNOSIS — F445 Conversion disorder with seizures or convulsions: Secondary | ICD-10-CM

## 2020-08-17 LAB — CBC WITH DIFFERENTIAL/PLATELET
Abs Immature Granulocytes: 0.02 10*3/uL (ref 0.00–0.07)
Basophils Absolute: 0.1 10*3/uL (ref 0.0–0.1)
Basophils Relative: 1 %
Eosinophils Absolute: 0.2 10*3/uL (ref 0.0–0.5)
Eosinophils Relative: 2 %
HCT: 41.3 % (ref 36.0–46.0)
Hemoglobin: 12.7 g/dL (ref 12.0–15.0)
Immature Granulocytes: 0 %
Lymphocytes Relative: 33 %
Lymphs Abs: 3.2 10*3/uL (ref 0.7–4.0)
MCH: 29.1 pg (ref 26.0–34.0)
MCHC: 30.8 g/dL (ref 30.0–36.0)
MCV: 94.7 fL (ref 80.0–100.0)
Monocytes Absolute: 0.5 10*3/uL (ref 0.1–1.0)
Monocytes Relative: 5 %
Neutro Abs: 5.9 10*3/uL (ref 1.7–7.7)
Neutrophils Relative %: 59 %
Platelets: 242 10*3/uL (ref 150–400)
RBC: 4.36 MIL/uL (ref 3.87–5.11)
RDW: 14.5 % (ref 11.5–15.5)
WBC: 9.8 10*3/uL (ref 4.0–10.5)
nRBC: 0 % (ref 0.0–0.2)

## 2020-08-17 LAB — URINALYSIS, ROUTINE W REFLEX MICROSCOPIC
Bilirubin Urine: NEGATIVE
Glucose, UA: NEGATIVE mg/dL
Ketones, ur: NEGATIVE mg/dL
Leukocytes,Ua: NEGATIVE
Nitrite: NEGATIVE
Protein, ur: NEGATIVE mg/dL
Specific Gravity, Urine: 1.005 (ref 1.005–1.030)
pH: 8 (ref 5.0–8.0)

## 2020-08-17 LAB — POC URINE PREG, ED: Preg Test, Ur: NEGATIVE

## 2020-08-17 LAB — BASIC METABOLIC PANEL
Anion gap: 9 (ref 5–15)
BUN: 5 mg/dL — ABNORMAL LOW (ref 6–20)
CO2: 27 mmol/L (ref 22–32)
Calcium: 8.9 mg/dL (ref 8.9–10.3)
Chloride: 102 mmol/L (ref 98–111)
Creatinine, Ser: 0.71 mg/dL (ref 0.44–1.00)
GFR, Estimated: >60 mL/min (ref >60)
Glucose, Bld: 89 mg/dL (ref 70–99)
Potassium: 4.2 mmol/L (ref 3.5–5.1)
Sodium: 138 mmol/L (ref 135–145)

## 2020-08-17 LAB — CBG MONITORING, ED: Glucose-Capillary: 96 mg/dL (ref 70–99)

## 2020-08-17 NOTE — Progress Notes (Signed)
Placed Holter monitor on patient, she felt light headed, dizzy, she started to tremor. With help from other nurse we lowered her to the floor, Dr. Warrick Parisian came to evaluate patient. He sent her by EMS to the hospital to be further evaluated for seizures.

## 2020-08-17 NOTE — ED Provider Notes (Signed)
[facility] EMERGENCY DEPARTMENT Provider Note   CSN: [id number] Arrival date & time: 08/17/20  1718     History Chief Complaint  Patient presents with  . Seizures    Jaime Flores is a 35 y.o. female with no significant chronic medical conditions presenting for evaluation of suspected new onset seizure disorder.  She has a recent history of multiple episodes of "syncopal events", approximately 6 episodes over the past 2 weeks, described as an episode of staring followed by collapse.  After these episodes she wakes with a headache, generalized weakness including a burning sensation in her posterior head in association with transient confusion which clears fairly quickly.  She also describes sometimes feeling tingling and burning sensation in her thighs and arms after these episodes.  She does not recall these events.  She was seen by her PCP Dr. Warrick Parisian last week.  She had basic blood work and found to be mildly hypokalemic, also has a low vitamin D level And anemia.  Of note, father has a history of grand mal seizure disorder.  She was in her doctor's office today having a Holter monitor placed when she had a episode which was witnessed by Dr. Warrick Parisian including documentation of a tremor during this event.  She was sent here for further evaluation of possible seizure disorder.  She currently reports mild headache and generalized fatigue, otherwise is without complaint.   The history is provided by the patient.       Past Medical History:  Diagnosis Date  . Amenorrhea 01/27/2013  . Angio-edema   . Breast pain 03/10/2015  . Encounter for menstrual regulation 07/05/2015  . Irregular intermenstrual bleeding 07/28/2015  . Irregular periods 07/05/2015  . Screening for STD (sexually transmitted disease) 08/07/2013  . Urticaria   . UTI (urinary tract infection)   . Vaginal discharge 06/10/2013    Patient Active Problem List   Diagnosis Date Noted  . Prediabetes 02/15/2020  .  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 Anxiety 11/18/2019  . Depression, recurrent ([place]) 11/18/2019  . Encounter for gynecological examination with Papanicolaou smear of cervix 06/03/2019  . 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 Cough 05/15/2019  . Seasonal and perennial allergic rhinitis 05/15/2019  . Bug bite 02/06/2019  . Hemorrhoids 09/26/2016  . Irregular intermenstrual bleeding 07/28/2015  . Amenorrhea 01/27/2013    Past Surgical History:  Procedure Laterality Date  . APPENDECTOMY    . CHOLECYSTECTOMY    . TUBAL LIGATION       OB History    Gravida  2   Para  2   Term  1   Preterm  1   AB      Living  2     SAB      IAB      Ectopic      Multiple      Live Births              Family History  Problem Relation Age of Onset  . Autism Son   . Diabetes Maternal Grandmother   . Stroke Maternal Grandmother   . Heart failure Maternal Grandmother   . Stroke Maternal Grandfather   . Stroke Father   . Seizures Father   . Allergies Father        penicillin  . Heart attack Father   . Other Mother        herpes  . Hyperlipidemia Mother   . Heart failure Paternal Grandfather   .  Diabetes Paternal Grandmother   . Hypertension Paternal Grandmother   . Autism Brother   . Other Paternal Uncle        heart exploded  . Cancer Maternal Aunt   . Diabetes Maternal Aunt   . Hypertension Maternal Aunt   . Immunodeficiency Neg Hx   . Urticaria Neg Hx   . Eczema Neg Hx   . Atopy Neg Hx   . Asthma Neg Hx   . Angioedema Neg Hx   . Allergic rhinitis Neg Hx     Social History   Tobacco Use  . Smoking status: Current Every Day Smoker    Packs/day: 0.50    Types: Cigarettes  . Smokeless tobacco: Never Used  Vaping Use  . Vaping Use: Never used  Substance Use Topics  . Alcohol use: No  . Drug use: No    Home Medications Prior to Admission medications   Medication Sig Start Date End Date Taking? Authorizing Provider  albuterol  (VENTOLIN HFA) 108 (90 Base) MCG/ACT inhaler Use 4 puffs every 4-6 hours as needed for cough or wheeze. 05/15/19   Valentina Shaggy, MD  Blood Glucose Monitoring Suppl (ACCU-CHEK GUIDE) w/Device KIT 1 Piece by Does not apply route as directed. 02/15/20   Cassandria Anger, MD  citalopram (CELEXA) 40 MG tablet Take 1 tablet (40 mg total) by mouth daily. 12/14/19   [name], Fransisca Kaufmann, MD  cyclobenzaprine (FLEXERIL) 5 MG tablet Take 1 tablet (5 mg total) by mouth 3 (three) times daily as needed for muscle spasms. 10/08/19   [name], Mary-Margaret, FNP  EPINEPHrine 0.3 mg/0.3 mL IJ SOAJ injection Inject 0.3 mLs (0.3 mg total) into the muscle as needed for anaphylaxis. 11/13/19   Valentina Shaggy, MD  fluticasone [facility]) 50 MCG/ACT nasal spray Place 2 sprays into both nostrils daily. 11/30/19   Wurst, [place], PA-C  glucose blood (ACCU-CHEK GUIDE) test strip Use as instructed 2-3 x daily. E11.65 02/15/20   Cassandria Anger, MD    Allergies    Doxycycline, Wellbutrin [bupropion], Catfish [fish allergy], and Tape  Review of Systems   Review of Systems  Constitutional: Positive for fatigue. Negative for fever.  HENT: Negative for congestion and sore throat.   Eyes: Negative.   Respiratory: Negative for chest tightness and shortness of breath.   Cardiovascular: Negative for chest pain.  Gastrointestinal: Negative for abdominal pain, nausea and vomiting.  Genitourinary: Negative.   Musculoskeletal: Negative for arthralgias, joint swelling and neck pain.  Skin: Negative.  Negative for rash and wound.  Neurological: Positive for tremors, weakness and headaches. Negative for dizziness, light-headedness and numbness.  Psychiatric/Behavioral: Negative.   All other systems reviewed and are negative.   Physical Exam Updated Vital Signs BP 108/63   Pulse (!) 116   Temp 98.2 F (36.8 C) (Oral)   Resp 18   Ht $R'5\' 2"'JU$  (1.575 m)   Wt 70.5 kg   LMP 07/29/2020   SpO2 95%   BMI 28.44  kg/m   Physical Exam Vitals and nursing note reviewed.  Constitutional:      Appearance: She is well-developed and well-nourished.  HENT:     Head: Normocephalic and atraumatic.  Eyes:     Conjunctiva/sclera: Conjunctivae normal.  Cardiovascular:     Rate and Rhythm: Normal rate and regular rhythm.     Pulses: Intact distal pulses.     Heart sounds: Normal heart sounds.  Pulmonary:     Effort: Pulmonary effort is normal.     Breath  sounds: Normal breath sounds. No wheezing.  Abdominal:     General: Bowel sounds are normal.     Palpations: Abdomen is soft.     Tenderness: There is no abdominal tenderness.  Musculoskeletal:        General: Normal range of motion.     Cervical back: Normal range of motion.  Skin:    General: Skin is warm and dry.  Neurological:     General: No focal deficit present.     Mental Status: She is alert and oriented to person, place, and time.     Cranial Nerves: No cranial nerve deficit.     Sensory: No sensory deficit.     Coordination: Coordination normal.     Gait: Gait normal.     Deep Tendon Reflexes: Reflexes normal.  Psychiatric:        Mood and Affect: Mood and affect normal.     ED Results / Procedures / Treatments   Labs (all labs ordered are listed, but only abnormal results are displayed) Labs Reviewed  BASIC METABOLIC PANEL - Abnormal; Notable for the following components:      Result Value   BUN 5 (*)    All other components within normal limits  URINALYSIS, ROUTINE W REFLEX MICROSCOPIC - Abnormal; Notable for the following components:   Color, Urine STRAW (*)    Hgb urine dipstick SMALL (*)    Bacteria, UA RARE (*)    All other components within normal limits  CBC WITH DIFFERENTIAL/PLATELET  CBG MONITORING, ED  POC URINE PREG, ED    EKG EKG Interpretation  Date/Time:  Wednesday August 17 2020 17:42:57 EST Ventricular Rate:  63 PR Interval:    QRS Duration: 93 QT Interval:  558 QTC Calculation: 572 R  Axis:   29 Text Interpretation: Sinus rhythm Borderline T abnormalities, anterior leads Prolonged QT interval since last tracing no significant change Confirmed by Noemi Chapel [phone]) on 08/17/2020 5:45:20 PM   Radiology No results found.  Procedures Procedures   Medications Ordered in ED Medications - No data to display  ED Course  I have reviewed the triage vital signs and the nursing notes.  Pertinent labs & imaging results that were available during my care of the patient were reviewed by me and considered in my medical decision making (see chart for details).    MDM Rules/Calculators/A&P                          Pt with questionable possible seizures vs syncopal episodes, had an episode here which responded to ammonia smelling salts, refer to Dr. Ammie Ferrier note.  Exam and labs here are stable with no post ictal sx and normal neuro exam.    She is felt stable for dc home, but will benefit from formal neurology exam and probable eeg to assess for any seizure component.  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   Pt was advised however she should not drive or operate any dangerous equipment until cleared by neurology.  Referral to Dr Merlene Laughter given.  Final Clinical Impression(s) / ED Diagnoses Final diagnoses:  Pseudoseizure    Rx / DC Orders ED Discharge Orders    None       Landis Martins 08/17/20 2039    Noemi Chapel, MD 08/18/20 1006

## 2020-08-17 NOTE — ED Triage Notes (Signed)
Pt brought in by EMS from [place] due to having seizure. Reported that pt was being seen due to what was thought to be syncopal episodes. Pt had a seizure while at office, then another while on the stretcher. Pt had slight twitching of extremities and would not respond approx one minute

## 2020-08-17 NOTE — Discharge Instructions (Addendum)
Your lab tests and exam tonight are reassuring, however you need to a formal evaluation by a neurologist along with an EEG test to help differentiate your symptoms and determine if these episodes are seizures.  In the interim,  it is unsafe and unlawful to drive a vehicle in the event you have an episode while driving.  You will need to be cleared by neurology to drive (assuming they determine these episodes are not seizures).  Call Dr Merlene Laughter for an appointment for further testing.

## 2020-08-17 NOTE — ED Provider Notes (Signed)
Medical screening examination/treatment/procedure(s) were conducted as a shared visit with non-physician practitioner(s) and myself.  I personally evaluated the patient during the encounter.  Clinical Impression:   Final diagnoses:  Pseudoseizure      Patient is a 35 year old female, no history of seizures, she does have a history of anxiety and has been under considerable amount of stress lately.  She lives with her mother and her 2 children.  She has had several episodes of shaking activity including one that occurred in the office.  The patient has had a couple of episodes in the emergency department, she is not postictal at all and comes around with ammonia smelling salts immediately.  On my exam she has a totally normal neurologic exam, clear heart and lung sounds, she is well-appearing, she is not suicidal or hallucinating, she does endorse having extra stress and anxiety.  I do not think a CT scan is necessary, labs will be ordered and she will need an outpatient EEG, this was explained to the mother and the patient, they are in agreement.  She is stable.  No ETOH, no drugs,  .   Noemi Chapel, MD 08/18/20 1006

## 2020-08-17 NOTE — ED Notes (Signed)
Nurse called to the bedside. Pt having a possible seizure. Amonia pop used & pt came to immediately. MD at the bedside. No distress noted.

## 2020-08-18 ENCOUNTER — Encounter: Payer: Self-pay | Admitting: Family Medicine

## 2020-08-18 ENCOUNTER — Ambulatory Visit (INDEPENDENT_AMBULATORY_CARE_PROVIDER_SITE_OTHER): Payer: Medicaid Other | Admitting: Family Medicine

## 2020-08-18 VITALS — BP 120/70 | HR 79 | Ht 62.0 in | Wt 155.5 lb

## 2020-08-18 DIAGNOSIS — R569 Unspecified convulsions: Secondary | ICD-10-CM

## 2020-08-18 MED ORDER — TRAZODONE HCL 50 MG PO TABS: 25.0000 mg | ORAL_TABLET | Freq: Every evening | ORAL | 3 refills | Status: DC | PRN

## 2020-08-18 NOTE — Progress Notes (Signed)
BP 120/70   Pulse 79   Ht _0  (1.575 m)   Wt 155 lb 8 oz (70.5 kg)   LMP 07/29/2020   SpO2 96%   BMI 28.44 kg/m    Subjective:   Patient ID: Jaime Flores, female    DOB: [birth year], 35 y.o.   MRN: [id number]  HPI: Jaime Flores is a 35 y.o. female presenting on 08/18/2020 for No chief complaint on file.   HPI Patient is coming in today for ER follow-up.  She has had seizure-like activity again once today and was witnessed yesterday in our office before going to the emergency department.  Has the appearance of possible pseudoseizures versus absence seizure's is what they said in the emergency department.  She has not seen a neurologist and has not had an EEG.  She says she just feels very fatigued from all of the episodes over the past couple days.  She does say that she has been having a lot more difficulty sleeping because she has been out of her trazodone.  She does have a new job over the past few weeks but does not necessarily note that its more stressful.  Relevant past medical, surgical, family and social history reviewed and updated as indicated. Interim medical history since our last visit reviewed. Allergies and medications reviewed and updated.  Review of Systems  Constitutional: Positive for fatigue. Negative for chills and fever.  Eyes: Negative for visual disturbance.  Respiratory: Negative for chest tightness and shortness of breath.   Cardiovascular: Negative for chest pain and leg swelling.  Musculoskeletal: Negative for back pain and gait problem.  Skin: Negative for rash.  Neurological: Positive for seizures and syncope. Negative for tremors, weakness, light-headedness and headaches.  Psychiatric/Behavioral: Negative for agitation, behavioral problems, dysphoric mood and sleep disturbance. The patient is not nervous/anxious.   All other systems reviewed and are negative.   Per HPI unless specifically indicated above   Allergies as of 08/18/2020       Reactions   Doxycycline Shortness Of Breath   Wellbutrin [bupropion] Shortness Of Breath, Nausea Only, Palpitations   Catfish [fish Allergy]    Tape Swelling   Plastic tape please use paper      Medication List       Accurate as of August 18, 2020  4:12 PM. If you have any questions, ask your nurse or doctor.        Accu-Chek Guide test strip Generic drug: glucose blood Use as instructed 2-3 x daily. E11.65   Accu-Chek Guide w/Device Kit 1 Piece by Does not apply route as directed.   albuterol 108 (90 Base) MCG/ACT inhaler Commonly known as: VENTOLIN HFA Use 4 puffs every 4-6 hours as needed for cough or wheeze.   citalopram 40 MG tablet Commonly known as: CELEXA Take 1 tablet (40 mg total) by mouth daily.   cyclobenzaprine 5 MG tablet Commonly known as: FLEXERIL Take 1 tablet (5 mg total) by mouth 3 (three) times daily as needed for muscle spasms.   EPINEPHrine 0.3 mg/0.3 mL Soaj injection Commonly known as: EPI-PEN Inject 0.3 mLs (0.3 mg total) into the muscle as needed for anaphylaxis.   fluticasone 50 MCG/ACT nasal spray Commonly known as: FLONASE Place 2 sprays into both nostrils daily.        Objective:   BP 120/70   Pulse 79   Ht _1  (1.575 m)   Wt 155 lb 8 oz (70.5 kg)   LMP 07/29/2020  SpO2 96%   BMI 28.44 kg/m   Wt Readings from Last 3 Encounters:  08/18/20 155 lb 8 oz (70.5 kg)  08/17/20 155 lb 8 oz (70.5 kg)  08/12/20 155 lb 8 oz (70.5 kg)    Physical Exam Vitals and nursing note reviewed.  Constitutional:      General: She is not in acute distress.    Appearance: She is well-developed and well-nourished. She is not diaphoretic.  Eyes:     Extraocular Movements: EOM normal.     Conjunctiva/sclera: Conjunctivae normal.  Cardiovascular:     Pulses: Intact distal pulses.  Musculoskeletal:        General: No edema.  Skin:    General: Skin is warm and dry.     Findings: No rash.  Neurological:     Mental Status: She is alert and  oriented to person, place, and time.     Coordination: Coordination normal.  Psychiatric:        Mood and Affect: Mood is anxious.        Behavior: Behavior normal.       Assessment & Plan:   Problem List Items Addressed This Visit   None   Visit Diagnoses    Seizure-like activity ([place])    -  Primary   Relevant Medications   traZODone (DESYREL) 50 MG tablet   Other Relevant Orders   Ambulatory referral to Neurology      We are working on stat neuro referral to try and get her as soon as we can. Follow up plan: Return if symptoms worsen or fail to improve.  Counseling provided for all of the vaccine components Orders Placed This Encounter  Procedures  . Ambulatory referral to Neurology    Caryl Pina, MD [facility] 08/18/2020, 4:12 PM

## 2020-08-19 ENCOUNTER — Ambulatory Visit: Payer: Medicaid Other | Admitting: Family Medicine

## 2020-08-23 ENCOUNTER — Ambulatory Visit: Payer: Medicaid Other | Admitting: Neurology

## 2020-08-23 ENCOUNTER — Encounter: Payer: Self-pay | Admitting: Neurology

## 2020-08-23 ENCOUNTER — Other Ambulatory Visit: Payer: Self-pay

## 2020-08-23 VITALS — BP 126/64 | HR 72 | Ht 62.0 in | Wt 156.5 lb

## 2020-08-23 DIAGNOSIS — R404 Transient alteration of awareness: Secondary | ICD-10-CM | POA: Diagnosis not present

## 2020-08-23 NOTE — Progress Notes (Signed)
Chief Complaint  Patient presents with  . New Patient (Initial Visit)    ED follow up. Notes from visit: Pt with questionable possible seizures vs syncopal episodes, had an episode here which responded to ammonia smelling salts, refer to Jaime Flores note.  Exam and labs here are stable with no post ictal sx and normal neuro exam. 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   Pt was advised however she should not drive or operate any dangerous equipment until cleared by neurology.    HISTORICAL  Jaime Flores is a 35 year old female, seen in request by her primary care physician Jaime Flores, Jaime Flores, for evaluation of body shaking spells, initial evaluation was on August 23, 2020.  I reviewed and summarized the referring note.  She was taken by EMS to emergency room from her primary care's office on August 17, 2020, patient reported that she went to Jaime Flores office to get cardiac monitoring device, since early January 2022, under extreme stress, she began to develop intermittent body shaking episode, sometimes multiple episode in a day without significant postevent confusion, no tongue biting, no urinary incontinence, sometimes can stretch few days without recurrent event  I was able to review to videotapes, patient has eyes closed, lying down position, body relax, in 1 videos, she was in sleep, right leg rotating movement, no tonic-clonic motion, the other video record, patient was lying on her left side, left hand shaking, lasting for few minutes, patient has no recollection of the events  Patient reported that prior to event, she would have numbness feeling throughout her whole body especially bilateral lower extremity, she also has no recollection of the event  She had multiple witnessed episode at ER evaluations, shaking episode, responsive to ammonia smelling salts, no post ictal confusion,  She has been treated with depression anxiety with  Celexa 40 mg daily since June 2021,  I personally reviewed CT head without contrast August 04, 2020, was reported normal  Laboratory evaluations in 2022: Normal CBC, BMP, creatinine 0.71, TSH, Vit D 19, Folic acid 2.4, Ferritin 46, Hg 13.9.   REVIEW OF SYSTEMS: Full 14 system review of systems performed and notable only for as above All other review of systems were negative.  ALLERGIES: Allergies  Allergen Reactions  . Doxycycline Shortness Of Breath  . Wellbutrin [Bupropion] Shortness Of Breath, Nausea Only and Palpitations  . Catfish [Fish Allergy]   . Tape Swelling    Plastic tape please use paper    HOME MEDICATIONS: Current Outpatient Medications  Medication Sig Dispense Refill  . albuterol (VENTOLIN HFA) 108 (90 Base) MCG/ACT inhaler Use 4 puffs every 4-6 hours as needed for cough or wheeze. 18 g 2  . Blood Glucose Monitoring Suppl (ACCU-CHEK GUIDE) w/Device KIT 1 Piece by Does not apply route as directed. 1 kit 0  . citalopram (CELEXA) 40 MG tablet Take 1 tablet (40 mg total) by mouth daily. 90 tablet 1  . cyclobenzaprine (FLEXERIL) 5 MG tablet Take 1 tablet (5 mg total) by mouth 3 (three) times daily as needed for muscle spasms. 30 tablet 1  . EPINEPHrine 0.3 mg/0.3 mL IJ SOAJ injection Inject 0.3 mLs (0.3 mg total) into the muscle as needed for anaphylaxis. 2 each 2  . fluticasone (FLONASE) 50 MCG/ACT nasal spray Place 2 sprays into both nostrils daily. 16 g 0  . glucose blood (ACCU-CHEK GUIDE) test strip Use as instructed 2-3 x daily. E11.65 100 each 2  . traZODone (DESYREL)  50 MG tablet Take 0.5-1 tablets (25-50 mg total) by mouth at bedtime as needed for sleep. 30 tablet 3   No current facility-administered medications for this visit.    PAST MEDICAL HISTORY: Past Medical History:  Diagnosis Date  . Amenorrhea 01/27/2013  . Angio-edema   . Breast pain 03/10/2015  . Encounter for menstrual regulation 07/05/2015  . Irregular intermenstrual bleeding 07/28/2015  .  Irregular periods 07/05/2015  . Screening for STD (sexually transmitted disease) 08/07/2013  . Urticaria   . UTI (urinary tract infection)   . Vaginal discharge 06/10/2013    PAST SURGICAL HISTORY: Past Surgical History:  Procedure Laterality Date  . APPENDECTOMY    . CHOLECYSTECTOMY    . TUBAL LIGATION      FAMILY HISTORY: Family History  Problem Relation Age of Onset  . Autism Son   . Diabetes Maternal Grandmother   . Stroke Maternal Grandmother   . Heart failure Maternal Grandmother   . Stroke Maternal Grandfather   . Stroke Father   . Seizures Father   . Allergies Father        penicillin  . Heart attack Father   . Other Mother        herpes  . Hyperlipidemia Mother   . Heart failure Paternal Grandfather   . Diabetes Paternal Grandmother   . Hypertension Paternal Grandmother   . Autism Brother   . Other Paternal Uncle        heart exploded  . Cancer Maternal Aunt   . Diabetes Maternal Aunt   . Hypertension Maternal Aunt   . Immunodeficiency Neg Hx   . Urticaria Neg Hx   . Eczema Neg Hx   . Atopy Neg Hx   . Asthma Neg Hx   . Angioedema Neg Hx   . Allergic rhinitis Neg Hx     SOCIAL HISTORY: Social History   Socioeconomic History  . Marital status: Single    Spouse name: Not on file  . Number of children: 2  . Years of education: 51  . Highest education level: High school graduate  Occupational History  . Occupation: Writer at [employer]  . Smoking status: Current Every Day Smoker    Packs/day: 0.50    Types: Cigarettes  . Smokeless tobacco: Never Used  Vaping Use  . Vaping Use: Never used  Substance and Sexual Activity  . Alcohol use: No  . Drug use: No  . Sexual activity: Yes    Birth control/protection: Surgical    Comment: tubal  Other Topics Concern  . Not on file  Social History Narrative   Lives with her two children and her mother.    Right-handed.   Caffeine use: 0.5 - 0.75 liters of soda per day, two cups  Mocha Frappe on weekdays   Social Determinants of Health   Financial Resource Strain: Not on file  Food Insecurity: Not on file  Transportation Needs: Not on file  Physical Activity: Not on file  Stress: Not on file  Social Connections: Not on file  Intimate Partner Violence: Not on file     PHYSICAL EXAM   Vitals:   08/23/20 1431  BP: 126/64  Pulse: 72  Weight: 156 lb 8 oz (71 kg)  Height: $Remove'5\' 2"'hJWkqHP$  (1.575 m)   Not recorded     Body mass index is 28.62 kg/m.  PHYSICAL EXAMNIATION:  Gen: NAD, conversant, well nourised, well groomed  Cardiovascular: Regular rate rhythm, no peripheral edema, warm, nontender. Eyes: Conjunctivae clear without exudates or hemorrhage Neck: Supple, no carotid bruits. Pulmonary: Clear to auscultation bilaterally   NEUROLOGICAL EXAM:  MENTAL STATUS: Speech:    Speech is normal; fluent and spontaneous with normal comprehension.  Cognition:     Orientation to time, place and person     Normal recent and remote memory     Normal Attention span and concentration     Normal Language, naming, repeating,spontaneous speech     Fund of knowledge   CRANIAL NERVES: CN II: Visual fields are full to confrontation. Pupils are round equal and briskly reactive to light. CN III, IV, VI: extraocular movement are normal. No ptosis. CN V: Facial sensation is intact to light touch CN VII: Face is symmetric with normal eye closure  CN VIII: Hearing is normal to causal conversation. CN IX, X: Phonation is normal. CN XI: Head turning and shoulder shrug are intact  MOTOR: There is no pronator drift of out-stretched arms. Muscle bulk and tone are normal. Muscle strength is normal.  REFLEXES: Reflexes are 2+ and symmetric at the biceps, triceps, knees, and ankles. Plantar responses are flexor.  SENSORY: Intact to light touch, pinprick and vibratory sensation are intact in fingers and toes.  COORDINATION: There is no trunk or limb  dysmetria noted.  GAIT/STANCE: Posture is normal. Gait is steady with normal steps, base, arm swing, and turning. Heel and toe walking are normal. Tandem gait is normal.  Romberg is absent.   DIAGNOSTIC DATA (LABS, IMAGING, TESTING) - I reviewed patient records, labs, notes, testing and imaging myself where available.   ASSESSMENT AND PLAN  Jaime Flores is a 35 y.o. female   Intermittent body shaking episode in the setting of extreme stress  Most consistent with pseudoseizure  She desires further evaluation,  Proceed with MRI of the brain with and without contrast, EEG  No driving until episode free for 6 months   Marcial Pacas, M.D. Ph.D.  [facility] [address], [place], [place] [address] Ph: [phone] Fax: [phone]  CC:  [name], Jaime Kaufmann, MD [address] [place],  [place] [address]

## 2020-08-24 ENCOUNTER — Telehealth: Payer: Self-pay | Admitting: Neurology

## 2020-08-24 ENCOUNTER — Encounter: Payer: Self-pay | Admitting: Family Medicine

## 2020-08-24 NOTE — Telephone Encounter (Signed)
Please advise okay for note?

## 2020-08-24 NOTE — Telephone Encounter (Signed)
mcd healthy blue pending can take up to 15 business days  

## 2020-08-26 ENCOUNTER — Ambulatory Visit: Payer: Medicaid Other | Admitting: Family Medicine

## 2020-08-26 ENCOUNTER — Other Ambulatory Visit: Payer: Self-pay

## 2020-08-26 ENCOUNTER — Encounter: Payer: Self-pay | Admitting: Family Medicine

## 2020-08-26 VITALS — BP 106/69 | HR 78 | Ht 62.0 in | Wt 156.0 lb

## 2020-08-26 DIAGNOSIS — F419 Anxiety disorder, unspecified: Secondary | ICD-10-CM | POA: Diagnosis not present

## 2020-08-26 DIAGNOSIS — F339 Major depressive disorder, recurrent, unspecified: Secondary | ICD-10-CM

## 2020-08-26 DIAGNOSIS — R569 Unspecified convulsions: Secondary | ICD-10-CM

## 2020-08-26 MED ORDER — REXULTI 0.5 MG PO TABS: 0.5000 mg | ORAL_TABLET | Freq: Every day | ORAL | 2 refills | Status: DC

## 2020-08-26 NOTE — Progress Notes (Signed)
BP 106/69   Pulse 78   Ht $R'5\' 2"'iW$  (1.575 m)   Wt 156 lb (70.8 kg)   LMP 07/29/2020   SpO2 99%   BMI 28.53 kg/m    Subjective:   Patient ID: Jaime Flores, female    DOB: 1986/07/17, 35 y.o.   MRN: [id number]  HPI: Jaime Flores is a 35 y.o. female presenting on 08/26/2020 for Medical Management of Chronic Issues, Anxiety, and Depression   HPI Anxiety and depression. Patient is still having a lot more anxiety and depression.  She is finally seeing the neurologist and trying to sort out whether those activities were seizure-like they were going on.  Patient denies any suicidal ideations or thoughts of hurting herself.  Relevant past medical, surgical, family and social history reviewed and updated as indicated. Interim medical history since our last visit reviewed. Allergies and medications reviewed and updated.  Review of Systems  Constitutional: Negative for chills and fever.  Eyes: Negative for visual disturbance.  Respiratory: Negative for chest tightness and shortness of breath.   Cardiovascular: Negative for chest pain and leg swelling.  Genitourinary: Negative for difficulty urinating and dysuria.  Musculoskeletal: Negative for back pain and gait problem.  Skin: Negative for rash.  Neurological: Positive for seizures. Negative for weakness, light-headedness and headaches.  Psychiatric/Behavioral: Positive for decreased concentration, dysphoric mood and sleep disturbance. Negative for agitation, behavioral problems, self-injury and suicidal ideas. The patient is nervous/anxious.   All other systems reviewed and are negative.   Per HPI unless specifically indicated above   Allergies as of 08/26/2020      Reactions   Doxycycline Shortness Of Breath   Wellbutrin [bupropion] Shortness Of Breath, Nausea Only, Palpitations   Catfish [fish Allergy]    Tape Swelling   Plastic tape please use paper      Medication List       Accurate as of August 26, 2020 11:37 AM.  If you have any questions, ask your nurse or doctor.        STOP taking these medications   cyclobenzaprine 5 MG tablet Commonly known as: FLEXERIL Stopped by: Fransisca Kaufmann Breyanna Valera, MD     TAKE these medications   Accu-Chek Guide test strip Generic drug: glucose blood Use as instructed 2-3 x daily. E11.65   Accu-Chek Guide w/Device Kit 1 Piece by Does not apply route as directed.   albuterol 108 (90 Base) MCG/ACT inhaler Commonly known as: VENTOLIN HFA Use 4 puffs every 4-6 hours as needed for cough or wheeze.   citalopram 40 MG tablet Commonly known as: CELEXA Take 1 tablet (40 mg total) by mouth daily.   EPINEPHrine 0.3 mg/0.3 mL Soaj injection Commonly known as: EPI-PEN Inject 0.3 mLs (0.3 mg total) into the muscle as needed for anaphylaxis.   fluticasone 50 MCG/ACT nasal spray Commonly known as: FLONASE Place 2 sprays into both nostrils daily.   Rexulti 0.5 MG Tabs Generic drug: Brexpiprazole Take 1 tablet (0.5 mg total) by mouth daily. Started by: [name], MD   traZODone 50 MG tablet Commonly known as: DESYREL Take 0.5-1 tablets (25-50 mg total) by mouth at bedtime as needed for sleep.        Objective:   BP 106/69   Pulse 78   Ht $R'5\' 2"'zQ$  (1.575 m)   Wt 156 lb (70.8 kg)   LMP 07/29/2020   SpO2 99%   BMI 28.53 kg/m   Wt Readings from Last 3 Encounters:  08/26/20 156 lb (70.8  kg)  08/23/20 156 lb 8 oz (71 kg)  08/18/20 155 lb 8 oz (70.5 kg)    Physical Exam Vitals and nursing note reviewed.  Constitutional:      General: She is not in acute distress.    Appearance: She is well-developed and well-nourished. She is not diaphoretic.  Eyes:     Extraocular Movements: EOM normal.     Conjunctiva/sclera: Conjunctivae normal.  Cardiovascular:     Pulses: Intact distal pulses.  Musculoskeletal:        General: No edema.  Skin:    General: Skin is warm and dry.     Findings: No rash.  Neurological:     Mental Status: She is alert and  oriented to person, place, and time.     Coordination: Coordination normal.  Psychiatric:        Mood and Affect: Mood is anxious and depressed.        Behavior: Behavior normal.        Thought Content: Thought content does not include suicidal ideation. Thought content does not include suicidal plan.       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  We will try for Rexulti. Follow up plan: Return in about 3 weeks (around 09/16/2020), or if symptoms worsen or fail to improve, for Anxiety depression.  Counseling provided for all of the vaccine components No orders of the defined types were placed in this encounter.   Caryl Pina, MD [facility] 08/26/2020, 11:37 AM

## 2020-08-29 ENCOUNTER — Telehealth: Payer: Self-pay | Admitting: *Deleted

## 2020-08-29 NOTE — Telephone Encounter (Signed)
PA in process  SIMORA DINGEE ([employer]) Rx #: [id number] Rexulti 0.5MG  tablets   Form [facility] Healthy [facility] Electronic [place] Form [phone] NCPDP)

## 2020-08-29 NOTE — Telephone Encounter (Signed)
Checked the status on the portal it is still pending.  

## 2020-08-31 ENCOUNTER — Other Ambulatory Visit: Payer: Self-pay

## 2020-08-31 ENCOUNTER — Ambulatory Visit: Payer: Medicaid Other | Admitting: Neurology

## 2020-08-31 DIAGNOSIS — R404 Transient alteration of awareness: Secondary | ICD-10-CM

## 2020-08-31 DIAGNOSIS — R4182 Altered mental status, unspecified: Secondary | ICD-10-CM

## 2020-09-05 NOTE — Telephone Encounter (Signed)
Checked status on the portal it ist still pending.

## 2020-09-05 NOTE — Procedures (Signed)
   HISTORY: 35 year old female with history of body shaking spells  TECHNIQUE:  This is a routine 16 channel EEG recording with one channel devoted to a limited EKG recording.  It was performed during wakefulness, drowsiness and asleep.  Hyperventilation and photic stimulation were performed as activating procedures.  There are frequent electrode, muscle artifact.  Upon maximum arousal, posterior dominant waking rhythm consistent of rhythmic alpha range activity Activities are symmetric over the bilateral posterior derivations and attenuated with eye opening.  Hyperventilation produced mild/moderate buildup with higher amplitude and the slower activities noted.  Photic stimulation did not alter the tracing.  During EEG recording, patient developed drowsiness and no deeper stage of sleep was achieved  During EEG recording, there was no epileptiform discharge noted.  EKG demonstrate sinus rhythm, with heart rate of 72 bpm  CONCLUSION: This is a  normal awake EEG.  There is no electrodiagnostic evidence of epileptiform discharge.  Marcial Pacas, M.D. Ph.D.  [facility] [place], [place] [address] Phone: [phone] Fax:      [phone]

## 2020-09-06 NOTE — Telephone Encounter (Signed)
Please clarify with patient, that she has control core body shaking during the spells, which should be controlled will be able to control the vehicle during those episodes?  From record, she has totally lost control of the situation,  Normal EEG does not rule out the possibility of seizure,  If she could not control the vehicle or has no control of the situation, she is having a spell, she should not drive until episode free for 6 months

## 2020-09-07 NOTE — Telephone Encounter (Signed)
No driving until episode free for 6 months

## 2020-09-07 NOTE — Telephone Encounter (Signed)
MCD healthy blue [name]: [id number] (exp. 08/24/20 to 09/28/20) patient is scheduled at [facility] for Tuesday 09/20/20 to arrive at 9:30 AM. Patient is aware of time and day. I also gave her their number of [phone] incase she needed to r/s.  The patient also asked me since her EEG came back clear can she drive now?

## 2020-09-07 NOTE — Telephone Encounter (Signed)
Spoke with patient and advised her of [place] DMV law, no driving until seizure, syncope event free x 6 months and under care of MD. Patient verbalized understanding, appreciation.

## 2020-09-12 NOTE — Telephone Encounter (Signed)
PA Case: [id number], Status: Approved, Coverage Starts on: 08/29/2020 12:00:00 AM, Coverage Ends on: 08/29/2021 12:00:00 AM.   Pharmacy aware.

## 2020-09-16 ENCOUNTER — Ambulatory Visit: Payer: Medicaid Other | Admitting: Family Medicine

## 2020-09-20 ENCOUNTER — Encounter: Payer: Self-pay | Admitting: Family Medicine

## 2020-09-20 ENCOUNTER — Telehealth: Payer: Self-pay | Admitting: Neurology

## 2020-09-20 ENCOUNTER — Other Ambulatory Visit: Payer: Self-pay

## 2020-09-20 ENCOUNTER — Ambulatory Visit (HOSPITAL_COMMUNITY)
Admission: RE | Admit: 2020-09-20 | Discharge: 2020-09-20 | Disposition: A | Discharge status: Home / Self Care | Payer: Medicaid Other | Source: Ambulatory Visit | Attending: Neurology | Admitting: Neurology

## 2020-09-20 DIAGNOSIS — Q283 Other malformations of cerebral vessels: Secondary | ICD-10-CM | POA: Diagnosis not present

## 2020-09-20 DIAGNOSIS — R404 Transient alteration of awareness: Secondary | ICD-10-CM | POA: Diagnosis not present

## 2020-09-20 DIAGNOSIS — R4182 Altered mental status, unspecified: Secondary | ICD-10-CM | POA: Diagnosis not present

## 2020-09-20 DIAGNOSIS — R569 Unspecified convulsions: Secondary | ICD-10-CM | POA: Diagnosis not present

## 2020-09-20 DIAGNOSIS — J3489 Other specified disorders of nose and nasal sinuses: Secondary | ICD-10-CM | POA: Diagnosis not present

## 2020-09-20 MED ORDER — GADOBUTROL 1 MMOL/ML IV SOLN
7.0000 mL | Freq: Once | INTRAVENOUS | Status: AC | PRN
  Administered 2020-09-20: 7 mL via INTRAVENOUS

## 2020-09-20 NOTE — Telephone Encounter (Signed)
I spoke to the patient and she verbalized understanding of the findings. 

## 2020-09-20 NOTE — Telephone Encounter (Signed)
  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

## 2020-10-07 ENCOUNTER — Other Ambulatory Visit: Payer: Self-pay | Admitting: Family Medicine

## 2020-10-07 DIAGNOSIS — F419 Anxiety disorder, unspecified: Secondary | ICD-10-CM

## 2020-10-07 DIAGNOSIS — F339 Major depressive disorder, recurrent, unspecified: Secondary | ICD-10-CM

## 2020-10-13 ENCOUNTER — Encounter: Payer: Self-pay | Admitting: Family

## 2020-10-13 ENCOUNTER — Telehealth (INDEPENDENT_AMBULATORY_CARE_PROVIDER_SITE_OTHER): Payer: Medicaid Other | Admitting: Family

## 2020-10-13 DIAGNOSIS — R202 Paresthesia of skin: Secondary | ICD-10-CM

## 2020-10-13 DIAGNOSIS — L608 Other nail disorders: Secondary | ICD-10-CM

## 2020-10-13 DIAGNOSIS — R200 Anesthesia of skin: Secondary | ICD-10-CM | POA: Diagnosis not present

## 2020-10-13 NOTE — Progress Notes (Signed)
   Virtual Visit via telephone Note Due to COVID-19 pandemic this visit was conducted virtually. This visit type was conducted due to national recommendations for restrictions regarding the COVID-19 Pandemic (e.g. social distancing, sheltering in place) in an effort to limit this patient's exposure and mitigate transmission in our community. All issues noted in this document were discussed and addressed.  A physical exam was not performed with this format.  I connected with Jaime Flores on [birth year] at 5:02 pm  by telephone and verified that I am speaking with the correct person using two identifiers. Jaime Flores is currently located at work and step dad is currently with her during visit. The provider, Evelina Dun, FNP is located in their office at time of visit.  I discussed the limitations, risks, security and privacy concerns of performing an evaluation and management service by telephone and the availability of in person appointments. I also discussed with the patient that there may be a patient responsible charge related to this service. The patient expressed understanding and agreed to proceed.   History and Present Illness:  HPI  PT calls the office today with complaints of changes of her finger nails discoloration of orange that she noticed this morning. She states she has tried washing her hands multiple times and has not resolved.   She reports her hands are warm, tingling, and numbness. Denies any pain or itching.   She is a smoker and admits to smoking 1/2 -1 pack a day.   Denies any contact with new chemicals or injuries.   Denies any recent nail polish use.   She does report recently starting Rexulti in the last 2 weeks. However, that does not seem to have this side effect.    Review of Systems  All other systems reviewed and are negative.    Observations/Objective: No SOB or distress noted, anxious  Assessment and Plan: 1. Discolored nails  2. Numbness  and tingling in both hands  Unsure what is causing this? Allergic reaction/contact dermatitis? She is dressing boyfriend dressing. She has started recent new medications, but I do not see this listed as possible adverse reaction. Recommend her start daily zyrtec and benadryl. Avoid scratching or harsh chemicals and let us know if symptoms worsen.        I discussed the assessment and treatment plan with the patient. The patient was provided an opportunity to ask questions and all were answered. The patient agreed with the plan and demonstrated an understanding of the instructions.   The patient was advised to call back or seek an in-person evaluation if the symptoms worsen or if the condition fails to improve as anticipated.  The above assessment and management plan was discussed with the patient. The patient verbalized understanding of and has agreed to the management plan. Patient is aware to call the clinic if symptoms persist or worsen. Patient is aware when to return to the clinic for a follow-up visit. Patient educated on when it is appropriate to go to the emergency department.   Time call ended:  5:13 pm   I provided 11 minutes of non-face-to-face time during this encounter.    Evelina Dun, FNP

## 2020-11-03 ENCOUNTER — Telehealth: Payer: Self-pay

## 2020-11-03 NOTE — Telephone Encounter (Signed)
  Prescription Request  11/03/2020  What is the name of the medication or equipment? celexa and rexulti  Have you contacted your pharmacy to request a refill? (if applicable) no  Which pharmacy would you like this sent to? [facility]   Patient notified that their request is being sent to the clinical staff for review and that they should receive a response within 2 business days.

## 2020-11-03 NOTE — Telephone Encounter (Signed)
Pt has not gotten refills since February, informed we have sent refills in since then & [facility] should have them on file. She will check with the pharmacy

## 2020-11-09 ENCOUNTER — Ambulatory Visit
Admission: EM | Admit: 2020-11-09 | Discharge: 2020-11-09 | Disposition: A | Discharge status: Home / Self Care | Payer: Medicaid Other | Attending: Emergency Medicine | Admitting: Emergency Medicine

## 2020-11-09 ENCOUNTER — Other Ambulatory Visit: Payer: Self-pay

## 2020-11-09 ENCOUNTER — Encounter: Payer: Self-pay | Admitting: Emergency Medicine

## 2020-11-09 DIAGNOSIS — R519 Headache, unspecified: Secondary | ICD-10-CM

## 2020-11-09 DIAGNOSIS — R0981 Nasal congestion: Secondary | ICD-10-CM | POA: Diagnosis not present

## 2020-11-09 DIAGNOSIS — R059 Cough, unspecified: Secondary | ICD-10-CM

## 2020-11-09 MED ORDER — PREDNISONE 20 MG PO TABS: 20.0000 mg | ORAL_TABLET | Freq: Two times a day (BID) | ORAL | 0 refills | Status: AC

## 2020-11-09 MED ORDER — AMOXICILLIN-POT CLAVULANATE 875-125 MG PO TABS: 1.0000 | ORAL_TABLET | Freq: Two times a day (BID) | ORAL | 0 refills | Status: AC

## 2020-11-09 NOTE — ED Provider Notes (Signed)
[facility]   [id number] 11/09/20 Arrival Time: 1855   CC: Sinus congestion  SUBJECTIVE: History from: patient.  GRAVIELA NODAL is a 35 y.o. female who presents with sinus congestion, cough, headache, and fatigue x few days.  Denies sick exposure to COVID, flu or strep.  Denies alleviating or aggravating factors.  Reports previous symptoms in the past with sinus infection.   Denies fever, chills, SOB, wheezing, chest pain, nausea, changes in bowel or bladder habits.    ROS: As per HPI.  All other pertinent ROS negative.     Past Medical History:  Diagnosis Date  . Amenorrhea 01/27/2013  . Angio-edema   . Breast pain 03/10/2015  . Encounter for menstrual regulation 07/05/2015  . Irregular intermenstrual bleeding 07/28/2015  . Irregular periods 07/05/2015  . Screening for STD (sexually transmitted disease) 08/07/2013  . Urticaria   . UTI (urinary tract infection)   . Vaginal discharge 06/10/2013   Past Surgical History:  Procedure Laterality Date  . APPENDECTOMY    . CHOLECYSTECTOMY    . TUBAL LIGATION     Allergies  Allergen Reactions  . Doxycycline Shortness Of Breath  . Wellbutrin [Bupropion] Shortness Of Breath, Nausea Only and Palpitations  . Catfish [Fish Allergy]   . Tape Swelling    Plastic tape please use paper   No current facility-administered medications on file prior to encounter.   Current Outpatient Medications on File Prior to Encounter  Medication Sig Dispense Refill  . albuterol (VENTOLIN HFA) 108 (90 Base) MCG/ACT inhaler Use 4 puffs every 4-6 hours as needed for cough or wheeze. 18 g 2  . Blood Glucose Monitoring Suppl (ACCU-CHEK GUIDE) w/Device KIT 1 Piece by Does not apply route as directed. 1 kit 0  . Brexpiprazole (REXULTI) 0.5 MG TABS Take 1 tablet (0.5 mg total) by mouth daily. 30 tablet 2  . citalopram (CELEXA) 40 MG tablet TAKE 1 TABLET BY MOUTH ONCE DAILY. 90 tablet 0  . EPINEPHrine 0.3 mg/0.3 mL IJ SOAJ injection Inject 0.3 mLs  (0.3 mg total) into the muscle as needed for anaphylaxis. 2 each 2  . fluticasone (FLONASE) 50 MCG/ACT nasal spray Place 2 sprays into both nostrils daily. 16 g 0  . glucose blood (ACCU-CHEK GUIDE) test strip Use as instructed 2-3 x daily. E11.65 100 each 2  . traZODone (DESYREL) 50 MG tablet Take 0.5-1 tablets (25-50 mg total) by mouth at bedtime as needed for sleep. 30 tablet 3   Social History   Socioeconomic History  . Marital status: Single    Spouse name: Not on file  . Number of children: 2  . Years of education: 57  . Highest education level: High school graduate  Occupational History  . Occupation: Writer at [employer]  . Smoking status: Current Every Day Smoker    Packs/day: 0.50    Types: Cigarettes  . Smokeless tobacco: Never Used  Vaping Use  . Vaping Use: Never used  Substance and Sexual Activity  . Alcohol use: No  . Drug use: No  . Sexual activity: Yes    Birth control/protection: Surgical    Comment: tubal  Other Topics Concern  . Not on file  Social History Narrative   Lives with her two children and her mother.    Right-handed.   Caffeine use: 0.5 - 0.75 liters of soda per day, two cups Mocha Frappe on weekdays   Social Determinants of Health   Financial Resource Strain: Not on file  Food Insecurity: Not on file  Transportation Needs: Not on file  Physical Activity: Not on file  Stress: Not on file  Social Connections: Not on file  Intimate Partner Violence: Not on file   Family History  Problem Relation Age of Onset  . Autism Son   . Diabetes Maternal Grandmother   . Stroke Maternal Grandmother   . Heart failure Maternal Grandmother   . Stroke Maternal Grandfather   . Stroke Father   . Seizures Father   . Allergies Father        penicillin  . Heart attack Father   . Other Mother        herpes  . Hyperlipidemia Mother   . Heart failure Paternal Grandfather   . Diabetes Paternal Grandmother   . Hypertension Paternal  Grandmother   . Autism Brother   . Other Paternal Uncle        heart exploded  . Cancer Maternal Aunt   . Diabetes Maternal Aunt   . Hypertension Maternal Aunt   . Immunodeficiency Neg Hx   . Urticaria Neg Hx   . Eczema Neg Hx   . Atopy Neg Hx   . Asthma Neg Hx   . Angioedema Neg Hx   . Allergic rhinitis Neg Hx     OBJECTIVE:  Vitals:   11/09/20 1933  BP: 130/80  Pulse: 91  Resp: 17  Temp: 98.7 F (37.1 C)  TempSrc: Oral  SpO2: 95%     General appearance: alert; mildly fatigued appearing, nontoxic; speaking in full sentences and tolerating own secretions HEENT: NCAT; Ears: EACs clear, TMs mildly injected; Eyes: PERRL.  EOM grossly intact.Nose: nares patent without rhinorrhea, Throat: oropharynx clear, tonsils non erythematous or enlarged, uvula midline  Neck: supple without LAD Lungs: unlabored respirations, symmetrical air entry; cough: absent; no respiratory distress; CTAB Heart: regular rate and rhythm.  Skin: warm and dry Psychological: alert and cooperative; normal mood and affect   ASSESSMENT & PLAN:  1. Sinus congestion   2. Cough   3. Acute nonintractable headache, unspecified headache type     Meds ordered this encounter  Medications  . amoxicillin-clavulanate (AUGMENTIN) 875-125 MG tablet    Sig: Take 1 tablet by mouth every 12 (twelve) hours for 10 days.    Dispense:  20 tablet    Refill:  0    Order Specific Question:   Supervising Provider    Answer:   Raylene Everts [[id number]]  . predniSONE (DELTASONE) 20 MG tablet    Sig: Take 1 tablet (20 mg total) by mouth 2 (two) times daily with a meal for 5 days.    Dispense:  10 tablet    Refill:  0    Order Specific Question:   Supervising Provider    Answer:   Raylene Everts [[id number]]    Rest and push fluids Prednisone prescribed.   Try prednisone, and if symptoms do not improve after 2-3 days fill Augmentin for possible bacterial sinus infeciton.  Augmentin prescribed.  Take as directed and  to completion Continue with OTC ibuprofen/tylenol as needed for pain Follow up with PCP or [facility] if symptoms persists Return or go to the ED if you have any new or worsening symptoms such as fever, chills, worsening sinus pain/pressure, cough, sore throat, chest pain, shortness of breath, abdominal pain, changes in bowel or bladder habits, etc...   Reviewed expectations re: course of current medical issues. Questions answered. Outlined signs and symptoms indicating need for more acute  intervention. Patient verbalized understanding. After Visit Summary given.         Lestine Box, PA-C 11/09/20 2008

## 2020-11-09 NOTE — Discharge Instructions (Addendum)
Rest and push fluids Prednisone prescribed.   Try prednisone, and if symptoms do not improve after 2-3 days fill Augmentin for possible bacterial sinus infeciton.  Augmentin prescribed.  Take as directed and to completion Continue with OTC ibuprofen/tylenol as needed for pain Follow up with PCP or [facility] if symptoms persists Return or go to the ED if you have any new or worsening symptoms such as fever, chills, worsening sinus pain/pressure, cough, sore throat, chest pain, shortness of breath, abdominal pain, changes in bowel or bladder habits, etc..[name]

## 2020-11-09 NOTE — ED Triage Notes (Signed)
Cough and headache x 1 day

## 2020-11-14 ENCOUNTER — Emergency Department (HOSPITAL_COMMUNITY)
Admission: EM | Admit: 2020-11-14 | Discharge: 2020-11-14 | Disposition: A | Discharge status: Home / Self Care | Payer: Medicaid Other | Attending: Emergency Medicine | Admitting: Emergency Medicine

## 2020-11-14 ENCOUNTER — Other Ambulatory Visit: Payer: Self-pay

## 2020-11-14 DIAGNOSIS — J069 Acute upper respiratory infection, unspecified: Secondary | ICD-10-CM | POA: Diagnosis not present

## 2020-11-14 DIAGNOSIS — F17210 Nicotine dependence, cigarettes, uncomplicated: Secondary | ICD-10-CM | POA: Insufficient documentation

## 2020-11-14 DIAGNOSIS — R059 Cough, unspecified: Secondary | ICD-10-CM | POA: Diagnosis present

## 2020-11-14 NOTE — ED Triage Notes (Signed)
Sinus pressure x 5 days, Headache x 1 day, cough x 5 days. Has not taken otc meds for same. Denies fever.

## 2020-11-14 NOTE — ED Provider Notes (Signed)
 [facility] Provider Note  CSN: [id number] Arrival date & time: 11/14/20 0016    History Chief Complaint  Patient presents with  . Headache       . Cough  . Facial Pain  . Sore Throat    HPI  Jaime Flores is a 35 y.o. female presents for evaluation of about 5-6 days of nasal congestion, sinus pressure, dry cough, sore throat and headache. She was seen at UC several days ago and given Rx for Prednisone and Augmentin for sinusitis but she has not yet gotten them filled. She came to night to make sure she didn't have anything serious and to be given the OK to return to work tomorrow.    Past Medical History:  Diagnosis Date  . Amenorrhea 01/27/2013  . Angio-edema   . Breast pain 03/10/2015  . Encounter for menstrual regulation 07/05/2015  . Irregular intermenstrual bleeding 07/28/2015  . Irregular periods 07/05/2015  . Screening for STD (sexually transmitted disease) 08/07/2013  . Urticaria   . UTI (urinary tract infection)   . Vaginal discharge 06/10/2013    Past Surgical History:  Procedure Laterality Date  . APPENDECTOMY    . CHOLECYSTECTOMY    . TUBAL LIGATION      Family History  Problem Relation Age of Onset  . Autism Son   . Diabetes Maternal Grandmother   . Stroke Maternal Grandmother   . Heart failure Maternal Grandmother   . Stroke Maternal Grandfather   . Stroke Father   . Seizures Father   . Allergies Father        penicillin  . Heart attack Father   . Other Mother        herpes  . Hyperlipidemia Mother   . Heart failure Paternal Grandfather   . Diabetes Paternal Grandmother   . Hypertension Paternal Grandmother   . Autism Brother   . Other Paternal Uncle        heart exploded  . Cancer Maternal Aunt   . Diabetes Maternal Aunt   . Hypertension Maternal Aunt   . Immunodeficiency Neg Hx   . Urticaria Neg Hx   . Eczema Neg Hx   . Atopy Neg Hx   . Asthma Neg Hx   . Angioedema Neg Hx   . Allergic rhinitis Neg Hx      Social History   Tobacco Use  . Smoking status: Current Every Day Smoker    Packs/day: 0.50    Types: Cigarettes  . Smokeless tobacco: Never Used  Vaping Use  . Vaping Use: Never used  Substance Use Topics  . Alcohol use: No  . Drug use: No     Home Medications Prior to Admission medications   Medication Sig Start Date End Date Taking? Authorizing Provider  albuterol (VENTOLIN HFA) 108 (90 Base) MCG/ACT inhaler Use 4 puffs every 4-6 hours as needed for cough or wheeze. 05/15/19   Gallagher, Joel Louis, MD  amoxicillin-clavulanate (AUGMENTIN) 875-125 MG tablet Take 1 tablet by mouth every 12 (twelve) hours for 10 days. 11/09/20 11/19/20  Wurst, Brittany, PA-C  Blood Glucose Monitoring Suppl (ACCU-CHEK GUIDE) w/Device KIT 1 Piece by Does not apply route as directed. 02/15/20   Nida, Gebreselassie W, MD  Brexpiprazole (REXULTI) 0.5 MG TABS Take 1 tablet (0.5 mg total) by mouth daily. 08/26/20   [name], Joshua A, MD  citalopram (CELEXA) 40 MG tablet TAKE 1 TABLET BY MOUTH ONCE DAILY. 10/10/20   [name], Joshua A, MD  EPINEPHrine 0.3   mg/0.3 mL IJ SOAJ injection Inject 0.3 mLs (0.3 mg total) into the muscle as needed for anaphylaxis. 11/13/19   Valentina Shaggy, MD  fluticasone [facility]) 50 MCG/ACT nasal spray Place 2 sprays into both nostrils daily. 11/30/19   Wurst, [place], PA-C  glucose blood (ACCU-CHEK GUIDE) test strip Use as instructed 2-3 x daily. E11.65 02/15/20   Cassandria Anger, MD  predniSONE (DELTASONE) 20 MG tablet Take 1 tablet (20 mg total) by mouth 2 (two) times daily with a meal for 5 days. 11/09/20 11/14/20  Wurst, Marye Round, PA-C  traZODone (DESYREL) 50 MG tablet Take 0.5-1 tablets (25-50 mg total) by mouth at bedtime as needed for sleep. 08/18/20   [name], Fransisca Kaufmann, MD     Allergies    Doxycycline, Wellbutrin [bupropion], Catfish [fish allergy], and Tape   Review of Systems   Review of Systems A comprehensive review of systems was completed and  negative except as noted in HPI.    Physical Exam BP 129/80 (BP Location: Right Arm)   Pulse 84   Temp 98.9 F (37.2 C) (Oral)   Resp 16   Wt 68 kg   LMP 09/20/2020   SpO2 98%   BMI 27.44 kg/m   Physical Exam Vitals and nursing note reviewed.  Constitutional:      Appearance: Normal appearance.  HENT:     Head: Normocephalic and atraumatic.     Nose: Congestion present.     Mouth/Throat:     Mouth: Mucous membranes are moist.     Pharynx: Posterior oropharyngeal erythema present. No pharyngeal swelling or oropharyngeal exudate.     Tonsils: No tonsillar exudate or tonsillar abscesses.  Eyes:     Extraocular Movements: Extraocular movements intact.     Conjunctiva/sclera: Conjunctivae normal.  Cardiovascular:     Rate and Rhythm: Normal rate.  Pulmonary:     Effort: Pulmonary effort is normal.     Breath sounds: Normal breath sounds. No wheezing or rales.  Abdominal:     General: Abdomen is flat.     Palpations: Abdomen is soft.     Tenderness: There is no abdominal tenderness.  Musculoskeletal:        General: No swelling. Normal range of motion.     Cervical back: Neck supple.  Lymphadenopathy:     Cervical: No cervical adenopathy.  Skin:    General: Skin is warm and dry.  Neurological:     General: No focal deficit present.     Mental Status: She is alert.  Psychiatric:        Mood and Affect: Mood normal.      ED Results / Procedures / Treatments   Labs (all labs ordered are listed, but only abnormal results are displayed) Labs Reviewed - No data to display  EKG None  Radiology No results found.  Procedures Procedures  Medications Ordered in the ED Medications - No data to display   MDM Rules/Calculators/A&P MDM Exam is benign. Likely a nonspecific viral illness, perhaps early or mild bacterial sinusitis. Advised to fill Rx from UC tomorrow. Continue OTC meds. May return to work.  ED Course  I have reviewed the triage vital signs and the  nursing notes.  Pertinent labs & imaging results that were available during my care of the patient were reviewed by me and considered in my medical decision making (see chart for details).     Final Clinical Impression(s) / ED Diagnoses Final diagnoses:  Upper respiratory tract infection, unspecified type  Rx / DC Orders ED Discharge Orders    None       Truddie Hidden, MD 11/14/20 [phone]

## 2020-11-15 ENCOUNTER — Telehealth: Payer: Self-pay

## 2020-11-15 NOTE — Telephone Encounter (Signed)
Transition Care Management Follow-up Telephone Call  Date of discharge and from where: 11/14/2020 [facility]  How have you been since you were released from the hospital? Pt stated that she is feeling much better today.   Any questions or concerns? No  Items Reviewed:  Did the pt receive and understand the discharge instructions provided? Yes   Medications obtained and verified? Yes   Other? No   Any new allergies since your discharge? No   Dietary orders reviewed? n/a  Do you have support at home? Yes   Functional Questionnaire: (I = Independent and D = Dependent) ADLs: I  Bathing/Dressing- I  Meal Prep- I  Eating- I  Maintaining continence- I  Transferring/Ambulation- I  Managing Meds- I   Follow up appointments reviewed:   PCP Hospital f/u appt confirmed? Yes  Scheduled to see Caryl Pina, MD on 12/07/2020.  [facility] f/u appt confirmed? No    Are transportation arrangements needed? No   If their condition worsens, is the pt aware to call PCP or go to the Emergency Dept.? Yes  Was the patient provided with contact information for the PCP's office or ED? Yes  Was to pt encouraged to call back with questions or concerns? Yes

## 2020-11-17 ENCOUNTER — Encounter: Payer: Self-pay | Admitting: Family Medicine

## 2020-11-17 ENCOUNTER — Ambulatory Visit (INDEPENDENT_AMBULATORY_CARE_PROVIDER_SITE_OTHER): Payer: Medicaid Other | Admitting: Family Medicine

## 2020-11-17 VITALS — BP 102/67 | HR 60 | Temp 97.9°F

## 2020-11-17 DIAGNOSIS — J069 Acute upper respiratory infection, unspecified: Secondary | ICD-10-CM

## 2020-11-17 DIAGNOSIS — J029 Acute pharyngitis, unspecified: Secondary | ICD-10-CM | POA: Diagnosis not present

## 2020-11-17 NOTE — Progress Notes (Signed)
Acute Office Visit  Subjective:    Patient ID: Jaime Flores, female    DOB: Jan 21, 1986, 35 y.o.   MRN: [id number]  Chief Complaint  Patient presents with  . Cough  . Facial Pain  . Sore Throat    HPI Patient is in today for cough, chest congestion, sore throat, and facial pain x 8 days. She was seen at urgent care on 4/20. She was given amoxicillin and prednisone. She started taking this on Monday. She was also seen in the ED on 11/14/20. She was told to pick up the antibiotic and prednisone and start those medications. She has not been feeling any better since starting amoxicillin and prednisone. She had a 100.3 temperature today. She denies chest pain, shortness of breath, nausea or vomiting. She is a smoker. She has a history of seasonal allergies. She does not take anything for this. She has not been tested for Covid. She has not been vaccinated. She really wants to be tested for strep.   Past Medical History:  Diagnosis Date  . Amenorrhea 01/27/2013  . Angio-edema   . Breast pain 03/10/2015  . Encounter for menstrual regulation 07/05/2015  . Irregular intermenstrual bleeding 07/28/2015  . Irregular periods 07/05/2015  . Screening for STD (sexually transmitted disease) 08/07/2013  . Urticaria   . UTI (urinary tract infection)   . Vaginal discharge 06/10/2013    Past Surgical History:  Procedure Laterality Date  . APPENDECTOMY    . CHOLECYSTECTOMY    . TUBAL LIGATION      Family History  Problem Relation Age of Onset  . Autism Son   . Diabetes Maternal Grandmother   . Stroke Maternal Grandmother   . Heart failure Maternal Grandmother   . Stroke Maternal Grandfather   . Stroke Father   . Seizures Father   . Allergies Father        penicillin  . Heart attack Father   . Other Mother        herpes  . Hyperlipidemia Mother   . Heart failure Paternal Grandfather   . Diabetes Paternal Grandmother   . Hypertension Paternal Grandmother   . Autism Brother   . Other  Paternal Uncle        heart exploded  . Cancer Maternal Aunt   . Diabetes Maternal Aunt   . Hypertension Maternal Aunt   . Immunodeficiency Neg Hx   . Urticaria Neg Hx   . Eczema Neg Hx   . Atopy Neg Hx   . Asthma Neg Hx   . Angioedema Neg Hx   . Allergic rhinitis Neg Hx     Social History   Socioeconomic History  . Marital status: Single    Spouse name: Not on file  . Number of children: 2  . Years of education: 70  . Highest education level: High school graduate  Occupational History  . Occupation: Writer at [employer]  . Smoking status: Current Every Day Smoker    Packs/day: 0.50    Types: Cigarettes  . Smokeless tobacco: Never Used  Vaping Use  . Vaping Use: Never used  Substance and Sexual Activity  . Alcohol use: No  . Drug use: No  . Sexual activity: Yes    Birth control/protection: Surgical    Comment: tubal  Other Topics Concern  . Not on file  Social History Narrative   Lives with her two children and her mother.    Right-handed.   Caffeine use: 0.5 -  0.75 liters of soda per day, two cups Mocha Frappe on weekdays   Social Determinants of Health   Financial Resource Strain: Not on file  Food Insecurity: Not on file  Transportation Needs: Not on file  Physical Activity: Not on file  Stress: Not on file  Social Connections: Not on file  Intimate Partner Violence: Not on file    Outpatient Medications Prior to Visit  Medication Sig Dispense Refill  . albuterol (VENTOLIN HFA) 108 (90 Base) MCG/ACT inhaler Use 4 puffs every 4-6 hours as needed for cough or wheeze. 18 g 2  . amoxicillin-clavulanate (AUGMENTIN) 875-125 MG tablet Take 1 tablet by mouth every 12 (twelve) hours for 10 days. 20 tablet 0  . Blood Glucose Monitoring Suppl (ACCU-CHEK GUIDE) w/Device KIT 1 Piece by Does not apply route as directed. 1 kit 0  . Brexpiprazole (REXULTI) 0.5 MG TABS Take 1 tablet (0.5 mg total) by mouth daily. 30 tablet 2  . citalopram (CELEXA) 40  MG tablet TAKE 1 TABLET BY MOUTH ONCE DAILY. 90 tablet 0  . EPINEPHrine 0.3 mg/0.3 mL IJ SOAJ injection Inject 0.3 mLs (0.3 mg total) into the muscle as needed for anaphylaxis. 2 each 2  . glucose blood (ACCU-CHEK GUIDE) test strip Use as instructed 2-3 x daily. E11.65 100 each 2  . Multiple Vitamin (MULTIVITAMIN) tablet Take 1 tablet by mouth daily.    . traZODone (DESYREL) 50 MG tablet Take 0.5-1 tablets (25-50 mg total) by mouth at bedtime as needed for sleep. 30 tablet 3  . fluticasone (FLONASE) 50 MCG/ACT nasal spray Place 2 sprays into both nostrils daily. (Patient not taking: Reported on 11/17/2020) 16 g 0   No facility-administered medications prior to visit.    Allergies  Allergen Reactions  . Doxycycline Shortness Of Breath  . Wellbutrin [Bupropion] Shortness Of Breath, Nausea Only and Palpitations  . Catfish [Fish Allergy]   . Tape Swelling    Plastic tape please use paper    Review of Systems As per HPI.     Objective:    Physical Exam Vitals and nursing note reviewed.  Constitutional:      General: She is not in acute distress.    Appearance: She is not ill-appearing, toxic-appearing or diaphoretic.  HENT:     Right Ear: Tympanic membrane and ear canal normal.     Left Ear: Tympanic membrane and ear canal normal.     Nose: No congestion.     Mouth/Throat:     Mouth: Mucous membranes are moist. No oral lesions.     Pharynx: Uvula midline. Posterior oropharyngeal erythema present. No pharyngeal swelling, oropharyngeal exudate or uvula swelling.     Tonsils: No tonsillar exudate or tonsillar abscesses. 1+ on the right. 1+ on the left.  Eyes:     Conjunctiva/sclera: Conjunctivae normal.     Pupils: Pupils are equal, round, and reactive to light.  Cardiovascular:     Rate and Rhythm: Normal rate and regular rhythm.     Heart sounds: Normal heart sounds.  Pulmonary:     Effort: Pulmonary effort is normal.     Breath sounds: Normal breath sounds.  Abdominal:      General: Bowel sounds are normal. There is no distension.     Palpations: Abdomen is soft.  Lymphadenopathy:     Cervical: Cervical adenopathy present.  Skin:    General: Skin is warm and dry.  Neurological:     General: No focal deficit present.     Mental Status:  She is alert and oriented to person, place, and time.  Psychiatric:        Mood and Affect: Mood normal.        Behavior: Behavior normal.     BP 102/67   Pulse 60   Temp 97.9 F (36.6 C) (Temporal)   SpO2 97% Comment: room air Wt Readings from Last 3 Encounters:  11/14/20 150 lb (68 kg)  08/26/20 156 lb (70.8 kg)  08/23/20 156 lb 8 oz (71 kg)    Health Maintenance Due  Topic Date Due  . Hepatitis C Screening  Never done  . COVID-19 Vaccine (1) Never done    There are no preventive care reminders to display for this patient.   Lab Results  Component Value Date   TSH 1.440 08/12/2020   Lab Results  Component Value Date   WBC 9.8 08/17/2020   HGB 12.7 08/17/2020   HCT 41.3 08/17/2020   MCV 94.7 08/17/2020   PLT 242 08/17/2020   Lab Results  Component Value Date   NA 138 08/17/2020   K 4.2 08/17/2020   CO2 27 08/17/2020   GLUCOSE 89 08/17/2020   BUN 5 (L) 08/17/2020   CREATININE 0.71 08/17/2020   BILITOT 0.2 08/12/2020   ALKPHOS 144 (H) 08/12/2020   AST 23 08/12/2020   ALT 21 08/12/2020   PROT 7.0 08/12/2020   ALBUMIN 4.6 08/12/2020   CALCIUM 8.9 08/17/2020   ANIONGAP 9 08/17/2020   Lab Results  Component Value Date   CHOL 153 12/09/2017   Lab Results  Component Value Date   HDL 37 (L) 12/09/2017   Lab Results  Component Value Date   LDLCALC 103 (H) 12/09/2017   Lab Results  Component Value Date   TRIG 64 12/09/2017   Lab Results  Component Value Date   CHOLHDL 4.1 12/09/2017   Lab Results  Component Value Date   HGBA1C 5.8 (A) 02/15/2020       Assessment & Plan:   Jaime Flores was seen today for cough, facial pain and sore throat.  Diagnoses and all orders for this  visit:  Viral URI with cough Negative strep. Currently on amoxicillin and prednisone burst. Reports side effects from prednisone- can discontinue this. Should continue amoxicillin. Discussed zyrtec and mucinex for cough and congestion. Stay well hydrated. Rest. covid test pending, quarantine until results.  -     Novel Coronavirus, NAA ([facility]) -     Rapid Strep Screen ([facility] ONLY)   Return to office for new or worsening symptoms, or if symptoms persist.   The patient indicates understanding of these issues and agrees with the plan.    Gwenlyn Perking, FNP

## 2020-11-18 ENCOUNTER — Telehealth: Payer: Self-pay

## 2020-11-18 LAB — SARS-COV-2, NAA 2 DAY TAT

## 2020-11-18 LAB — NOVEL CORONAVIRUS, NAA: SARS-CoV-2, NAA: NOT DETECTED

## 2020-11-18 NOTE — Telephone Encounter (Signed)
Pt. Checking on COVID results, not available yet.

## 2020-11-21 LAB — RAPID STREP SCREEN (MED CTR MEBANE ONLY): Strep Gp A Ag, IA W/Reflex: NEGATIVE

## 2020-11-21 LAB — CULTURE, GROUP A STREP

## 2020-12-07 ENCOUNTER — Ambulatory Visit: Payer: Medicaid Other | Admitting: Family Medicine

## 2020-12-07 ENCOUNTER — Encounter: Payer: Self-pay | Admitting: Family Medicine

## 2020-12-07 VITALS — Wt 170.0 lb

## 2020-12-07 DIAGNOSIS — R569 Unspecified convulsions: Secondary | ICD-10-CM

## 2020-12-07 DIAGNOSIS — F419 Anxiety disorder, unspecified: Secondary | ICD-10-CM

## 2020-12-07 DIAGNOSIS — F339 Major depressive disorder, recurrent, unspecified: Secondary | ICD-10-CM

## 2020-12-07 MED ORDER — TRAZODONE HCL 50 MG PO TABS: 25.0000 mg | ORAL_TABLET | Freq: Every evening | ORAL | 3 refills | Status: DC | PRN

## 2020-12-07 MED ORDER — REXULTI 0.5 MG PO TABS: 0.5000 mg | ORAL_TABLET | Freq: Every day | ORAL | 5 refills | Status: DC

## 2020-12-07 MED ORDER — CITALOPRAM HYDROBROMIDE 40 MG PO TABS: 40.0000 mg | ORAL_TABLET | Freq: Every day | ORAL | 3 refills | Status: DC

## 2020-12-07 NOTE — Progress Notes (Signed)
Virtual Visit via telephone Note  I connected with Jaime Flores on [birth year] at 1139 by telephone and verified that I am speaking with the correct person using two identifiers. Jaime Flores is currently located at home and patient are currently with her during visit. The provider, Fransisca Kaufmann Gilberto Stanforth, MD is located in their office at time of visit.  Call ended at 1150  I discussed the limitations, risks, security and privacy concerns of performing an evaluation and management service by telephone and the availability of in person appointments. I also discussed with the patient that there may be a patient responsible charge related to this service. The patient expressed understanding and agreed to proceed.   History and Present Illness: Anxiety and depression recheck Patient is doing a lot better and feels like medicine is helping. Sometimes she forgets on the weekends sometimes.  She is more motivated and feels like it is really working.  She is not having pseudoseizures. She denies side effects and is sleeping somewhat bad and is trying to develop routine and sleeps by 9 pm and feels refreshed more in the morning.  She is going through breakup right now. She feels she needs more sleep to feel rested.  She feels like her weight is up. She is up to 170lbs.  She is going to try BURN to see if helps with weight. She feels somewhat sluggish because of it.  She does drink a frappe frequently.   1. Anxiety   2. Depression, recurrent ([place])   3. Seizure-like activity ([place])     Outpatient Encounter Medications as of 12/07/2020  Medication Sig  . albuterol (VENTOLIN HFA) 108 (90 Base) MCG/ACT inhaler Use 4 puffs every 4-6 hours as needed for cough or wheeze.  [name] Blood Glucose Monitoring Suppl (ACCU-CHEK GUIDE) w/Device KIT 1 Piece by Does not apply route as directed.  . Brexpiprazole (REXULTI) 0.5 MG TABS Take 1 tablet (0.5 mg total) by mouth daily.  . citalopram (CELEXA) 40 MG tablet Take 1  tablet (40 mg total) by mouth daily.  [name] EPINEPHrine 0.3 mg/0.3 mL IJ SOAJ injection Inject 0.3 mLs (0.3 mg total) into the muscle as needed for anaphylaxis.  . fluticasone (FLONASE) 50 MCG/ACT nasal spray Place 2 sprays into both nostrils daily. (Patient not taking: Reported on 11/17/2020)  . glucose blood (ACCU-CHEK GUIDE) test strip Use as instructed 2-3 x daily. E11.65  . Multiple Vitamin (MULTIVITAMIN) tablet Take 1 tablet by mouth daily.  . traZODone (DESYREL) 50 MG tablet Take 0.5-1 tablets (25-50 mg total) by mouth at bedtime as needed for sleep.  . [DISCONTINUED] Brexpiprazole (REXULTI) 0.5 MG TABS Take 1 tablet (0.5 mg total) by mouth daily.  . [DISCONTINUED] citalopram (CELEXA) 40 MG tablet TAKE 1 TABLET BY MOUTH ONCE DAILY.  . [DISCONTINUED] traZODone (DESYREL) 50 MG tablet Take 0.5-1 tablets (25-50 mg total) by mouth at bedtime as needed for sleep.   No facility-administered encounter medications on file as of 12/07/2020.    Review of Systems  Constitutional: Negative for chills and fever.  Eyes: Negative for visual disturbance.  Respiratory: Negative for chest tightness and shortness of breath.   Cardiovascular: Negative for chest pain and leg swelling.  Skin: Negative for rash.  Neurological: Negative for light-headedness and headaches.  Psychiatric/Behavioral: Positive for dysphoric mood and sleep disturbance. Negative for agitation, behavioral problems, self-injury and suicidal ideas. The patient is nervous/anxious.   All other systems reviewed and are negative.   Observations/Objective: Patient sounds comfortable and in  no acute distress.  Assessment and Plan: Problem List Items Addressed This Visit      Other   Anxiety - Primary   Relevant Medications   citalopram (CELEXA) 40 MG tablet   traZODone (DESYREL) 50 MG tablet   Depression, recurrent (HCC)   Relevant Medications   citalopram (CELEXA) 40 MG tablet   traZODone (DESYREL) 50 MG tablet    Other Visit  Diagnoses    Seizure-like activity ([place])       Relevant Medications   traZODone (DESYREL) 50 MG tablet      Patient has been doing well on her medication except for she is not sleeping as well but she is working on that and trying to improve her sleep habits and getting more of a routine.  She says it is starting to help some. Follow up plan: Return in about 3 months (around 03/09/2021), or if symptoms worsen or fail to improve, for depression and anxiety.     I discussed the assessment and treatment plan with the patient. The patient was provided an opportunity to ask questions and all were answered. The patient agreed with the plan and demonstrated an understanding of the instructions.   The patient was advised to call back or seek an in-person evaluation if the symptoms worsen or if the condition fails to improve as anticipated.  The above assessment and management plan was discussed with the patient. The patient verbalized understanding of and has agreed to the management plan. Patient is aware to call the clinic if symptoms persist or worsen. Patient is aware when to return to the clinic for a follow-up visit. Patient educated on when it is appropriate to go to the emergency department.    I provided 11 minutes of non-face-to-face time during this encounter.    [name], MD

## 2020-12-22 ENCOUNTER — Ambulatory Visit: Payer: Medicaid Other | Admitting: Nurse Practitioner

## 2020-12-22 ENCOUNTER — Encounter: Payer: Self-pay | Admitting: Nurse Practitioner

## 2020-12-22 ENCOUNTER — Other Ambulatory Visit: Payer: Self-pay

## 2020-12-22 ENCOUNTER — Encounter: Payer: Self-pay | Admitting: Family Medicine

## 2020-12-22 VITALS — BP 107/71 | HR 62 | Temp 98.2°F | Resp 20 | Ht 62.0 in | Wt 157.0 lb

## 2020-12-22 DIAGNOSIS — R110 Nausea: Secondary | ICD-10-CM

## 2020-12-22 DIAGNOSIS — S30861A Insect bite (nonvenomous) of abdominal wall, initial encounter: Secondary | ICD-10-CM

## 2020-12-22 DIAGNOSIS — W57XXXA Bitten or stung by nonvenomous insect and other nonvenomous arthropods, initial encounter: Secondary | ICD-10-CM

## 2020-12-22 MED ORDER — AMOXICILLIN 500 MG PO CAPS: 500.0000 mg | ORAL_CAPSULE | Freq: Three times a day (TID) | ORAL | 0 refills | Status: AC

## 2020-12-22 MED ORDER — ONDANSETRON HCL 4 MG PO TABS: 4.0000 mg | ORAL_TABLET | Freq: Three times a day (TID) | ORAL | 0 refills | Status: DC | PRN

## 2020-12-22 NOTE — Patient Instructions (Signed)
Tick Bite Information, Adult  Ticks are insects that can bite. Most ticks live in shrubs and grassy areas. They climb onto people and animals that go by. Then they bite. Some ticks carry germs that can make you sick. How can I prevent tick bites? Take these steps: Use insect repellent  Use an insect repellent that has 20% or higher of the ingredients DEET, picaridin, or IR3535. Follow the instructions on the label. Put it on: ? Bare skin. ? The tops of your boots. ? Your pant legs. ? The ends of your sleeves.  If you use an insect repellent that has the ingredient permethrin, follow the instructions on the label. Put it on: ? Clothing. ? Boots. ? Supplies or outdoor gear. ? Tents. When you are outside  Wear long sleeves and long pants.  Wear light-colored clothes.  Tuck your pant legs into your socks.  Stay in the middle of the trail. Do not touch the bushes.  Avoid walking through long grass.  Check for ticks on your clothes, hair, and skin often while you are outside. Before going inside your house, check your clothes, skin, head, neck, armpits, waist, groin, and joint areas. When you go indoors  Check your clothes for ticks. Dry your clothes in a dryer on high heat for 10 minutes or more. If clothes are damp, additional time may be needed.  Wash your clothes right away if they need to be washed. Use hot water.  Check your pets and outdoor gear.  Shower right away.  Check your body for ticks. Do a full body check using a mirror. What is the right way to remove a tick? Remove the tick from your skin as soon as possible. Do not remove the tick with your bare fingers.  To remove a tick that is crawling on your skin: ? Go outdoors and brush the tick off. ? Use tape or a lint roller.  To remove a tick that is biting: 1. Wash your hands. 2. If you have latex gloves, put them on. 3. Use tweezers, curved forceps, or a tick-removal tool to grasp the tick. Grasp the tick  as close to your skin and as close to the tick's head as possible. 4. Gently pull up until the tick lets go.  Try to keep the tick's head attached to its body.  Do not twist or jerk the tick.  Do not squeeze or crush the tick. Do not try to remove a tick with heat, alcohol, petroleum jelly, or fingernail polish.   What should I do after taking out a tick?  Throw away the tick. Do not crush a tick with your fingers.  Clean the bite area and your hands with soap and water, rubbing alcohol, or an iodine wash.  If an antiseptic cream or ointment is available, apply a small amount to the bite area.  Wash and disinfect any instruments that you used to remove the tick. How should I get rid of a live tick? To dispose of a live tick, use one of these methods:  Place the tick in rubbing alcohol.  Place the tick in a bag or container you can close tightly.  Wrap the tick tightly in tape.  Flush the tick down the toilet. Contact a doctor if:  You have symptoms, such as: ? A fever or chills. ? A red rash that makes a circle (bull's-eye rash) in the bite area. ? Redness and swelling where the tick bit you. ? Headache. ?  Pain in a muscle, joint, or bone. ? Being more tired than normal. ? Trouble walking or moving your legs. ? Numbness in your legs. ? Tender and swollen lymph glands.  A part of a tick breaks off and gets stuck in your skin. Get help right away if:  You cannot remove a tick.  You cannot move (have paralysis) or feel weak.  You are feeling worse or have new symptoms.  You find a tick that is biting you and filled with blood. This is important if you are in an area where diseases from ticks are common. Summary  Ticks may carry germs that can make you sick.  To prevent tick bites wear long sleeves, long pants, and light colors. Use insect repellent. Follow the instructions on the label.  If the tick is biting, do not try to remove it with heat, alcohol, petroleum  jelly, or fingernail polish.  Use tweezers, curved forceps, or a tick-removal tool to grasp the tick. Gently pull up until the tick lets go. Do not twist or jerk the tick. Do not squeeze or crush the tick.  If you have symptoms, contact a doctor. This information is not intended to replace advice given to you by your health care provider. Make sure you discuss any questions you have with your health care provider. Document Revised: 07/06/2019 Document Reviewed: 07/06/2019 Elsevier Patient Education  2021 [employer].

## 2020-12-22 NOTE — Progress Notes (Signed)
   Subjective:    Patient ID: Jaime Flores, female    DOB: 01-May-1986, 35 y.o.   MRN: [id number]   Chief Complaint: Tick Removal   HPI Patient comes in c/o tick bites. Sh ehas removed several ticks. The ticks were on her for at least 2 days. She has been nauseous tfor th elast 2 days as well.   Review of Systems  Constitutional: Negative.   HENT: Negative.   Respiratory: Negative.   Cardiovascular: Negative.   Gastrointestinal: Positive for nausea.  Neurological: Negative.   Psychiatric/Behavioral: Negative.   All other systems reviewed and are negative.      Objective:   Physical Exam Vitals and nursing note reviewed.  Constitutional:      Appearance: Normal appearance.  Cardiovascular:     Rate and Rhythm: Normal rate and regular rhythm.     Heart sounds: Normal heart sounds.  Pulmonary:     Effort: Pulmonary effort is normal.     Breath sounds: Normal breath sounds.  Skin:    General: Skin is warm.     Comments: No lesions visible from where ticks were removed  Neurological:     General: No focal deficit present.     Mental Status: She is alert and oriented to person, place, and time.  Psychiatric:        Mood and Affect: Mood normal.        Behavior: Behavior normal.     BP 107/71   Pulse 62   Temp 98.2 F (36.8 C) (Temporal)   Resp 20   Ht 5\' 2"  (1.575 m)   Wt 157 lb (71.2 kg)   SpO2 97%   BMI 28.72 kg/m        Assessment & Plan:  Jaime Flores in today with chief complaint of Tick Removal   1. Tick bite of abdominal wall, initial encounter Watch areas for changes Orders Placed This Encounter  Procedures  . Lyme Disease Serology w/Reflex  . [place] spotted fvr abs pnl(IgG+IgM)    - amoxicillin (AMOXIL) 500 MG capsule; Take 1 capsule (500 mg total) by mouth 3 (three) times daily for 10 days.  Dispense: 42 capsule; Refill: 0  2. Nausea Force fluids - ondansetron (ZOFRAN) 4 MG tablet; Take 1 tablet (4 mg total) by mouth every 8  (eight) hours as needed for nausea or vomiting.  Dispense: 20 tablet; Refill: 0    The above assessment and management plan was discussed with the patient. The patient verbalized understanding of and has agreed to the management plan. Patient is aware to call the clinic if symptoms persist or worsen. Patient is aware when to return to the clinic for a follow-up visit. Patient educated on when it is appropriate to go to the emergency department.   Mary-Margaret [name], FNP

## 2020-12-28 LAB — LYME DISEASE SEROLOGY W/REFLEX: Lyme Total Antibody EIA: NEGATIVE

## 2020-12-28 LAB — ROCKY MTN SPOTTED FVR ABS PNL(IGG+IGM)
RMSF IgG: NEGATIVE
RMSF IgM: 1.79 index — ABNORMAL HIGH (ref 0.00–0.89)

## 2021-01-18 ENCOUNTER — Emergency Department (HOSPITAL_COMMUNITY)
Admission: EM | Admit: 2021-01-18 | Discharge: 2021-01-18 | Disposition: A | Discharge status: Home / Self Care | Payer: Medicaid Other | Attending: Emergency Medicine | Admitting: Emergency Medicine

## 2021-01-18 ENCOUNTER — Encounter (HOSPITAL_COMMUNITY): Payer: Self-pay

## 2021-01-18 ENCOUNTER — Other Ambulatory Visit: Payer: Self-pay

## 2021-01-18 DIAGNOSIS — F17210 Nicotine dependence, cigarettes, uncomplicated: Secondary | ICD-10-CM | POA: Insufficient documentation

## 2021-01-18 DIAGNOSIS — R457 State of emotional shock and stress, unspecified: Secondary | ICD-10-CM | POA: Diagnosis not present

## 2021-01-18 DIAGNOSIS — N390 Urinary tract infection, site not specified: Secondary | ICD-10-CM | POA: Diagnosis not present

## 2021-01-18 DIAGNOSIS — R569 Unspecified convulsions: Secondary | ICD-10-CM | POA: Insufficient documentation

## 2021-01-18 LAB — COMPREHENSIVE METABOLIC PANEL WITH GFR
ALT: 31 U/L (ref 0–44)
AST: 22 U/L (ref 15–41)
Albumin: 4 g/dL (ref 3.5–5.0)
Alkaline Phosphatase: 92 U/L (ref 38–126)
Anion gap: 6 (ref 5–15)
BUN: 8 mg/dL (ref 6–20)
CO2: 26 mmol/L (ref 22–32)
Calcium: 9.1 mg/dL (ref 8.9–10.3)
Chloride: 106 mmol/L (ref 98–111)
Creatinine, Ser: 0.62 mg/dL (ref 0.44–1.00)
GFR, Estimated: >60 mL/min (ref >60)
Glucose, Bld: 88 mg/dL (ref 70–99)
Potassium: 3.9 mmol/L (ref 3.5–5.1)
Sodium: 138 mmol/L (ref 135–145)
Total Bilirubin: 0.2 mg/dL — ABNORMAL LOW (ref 0.3–1.2)
Total Protein: 7.1 g/dL (ref 6.5–8.1)

## 2021-01-18 LAB — CBC WITH DIFFERENTIAL/PLATELET
Abs Immature Granulocytes: 0.02 K/uL (ref 0.00–0.07)
Basophils Absolute: 0.1 K/uL (ref 0.0–0.1)
Basophils Relative: 1 %
Eosinophils Absolute: 0.2 K/uL (ref 0.0–0.5)
Eosinophils Relative: 2 %
HCT: 42.2 % (ref 36.0–46.0)
Hemoglobin: 13.2 g/dL (ref 12.0–15.0)
Immature Granulocytes: 0 %
Lymphocytes Relative: 26 %
Lymphs Abs: 2.5 K/uL (ref 0.7–4.0)
MCH: 28.5 pg (ref 26.0–34.0)
MCHC: 31.3 g/dL (ref 30.0–36.0)
MCV: 91.1 fL (ref 80.0–100.0)
Monocytes Absolute: 0.5 K/uL (ref 0.1–1.0)
Monocytes Relative: 5 %
Neutro Abs: 6.4 K/uL (ref 1.7–7.7)
Neutrophils Relative %: 66 %
Platelets: 346 K/uL (ref 150–400)
RBC: 4.63 MIL/uL (ref 3.87–5.11)
RDW: 14.7 % (ref 11.5–15.5)
WBC: 9.6 K/uL (ref 4.0–10.5)
nRBC: 0 % (ref 0.0–0.2)

## 2021-01-18 LAB — URINALYSIS, ROUTINE W REFLEX MICROSCOPIC
Bilirubin Urine: NEGATIVE
Glucose, UA: NEGATIVE mg/dL
Ketones, ur: NEGATIVE mg/dL
Nitrite: NEGATIVE
Protein, ur: NEGATIVE mg/dL
Specific Gravity, Urine: 1.004 — ABNORMAL LOW (ref 1.005–1.030)
pH: 7 (ref 5.0–8.0)

## 2021-01-18 LAB — MAGNESIUM: Magnesium: 2 mg/dL (ref 1.7–2.4)

## 2021-01-18 MED ORDER — CEPHALEXIN 500 MG PO CAPS
500.0000 mg | ORAL_CAPSULE | Freq: Once | ORAL | Status: AC
  Administered 2021-01-18: 11:00:00 500 mg via ORAL
  Filled 2021-01-18: qty 1

## 2021-01-18 MED ORDER — CEPHALEXIN 500 MG PO CAPS: 500.0000 mg | ORAL_CAPSULE | Freq: Two times a day (BID) | ORAL | 0 refills | Status: AC

## 2021-01-18 NOTE — ED Provider Notes (Signed)
[facility] EMERGENCY DEPARTMENT Provider Note   CSN: [id number] Arrival date & time: 01/18/21  1004     History Chief Complaint  Patient presents with   Seizures    Jaime Flores is a 35 y.o. 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  The patient ports Jaime Flores had 4 episodes at work back-to-back.  Jaime Flores says these were typical of her episodes.  Her last episode was back in February.  Jaime Flores says Jaime Flores does experience a prodrome, with tightness in her chest and back, heaviness in her legs.  Jaime Flores was conscious during the episode.  Jaime Flores denies urinary incontinence.  Jaime Flores now feels back to baseline.  Jaime Flores denies any head trauma or injuries.  Jaime Flores denies significant headache.  Jaime Flores otherwise reports Jaime Flores was in her normal state of health without several days  Jaime Flores reported Jaime Flores was diagnosed with [facility] spotted fever approximately 1 month ago, which was tested after a tick bite, and because Jaime Flores has allergies to doxycycline, Jaime Flores was treated with amoxicillin x 10 days.  Jaime Flores denies to me any rash, persistent fevers, nausea, vomiting, headaches, or any other symptoms.  Jaime Flores denies any recreational drug use or new medications.  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   Anxiety 11/18/2019   Depression, recurrent ([place]) 11/18/2019   Encounter for gynecological examination with Papanicolaou smear of cervix 06/03/2019   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   Cough 05/15/2019   Seasonal and perennial allergic rhinitis 05/15/2019   Bug bite 02/06/2019   Hemorrhoids 09/26/2016   Irregular intermenstrual bleeding 07/28/2015   Amenorrhea 01/27/2013    Past Surgical History:  Procedure Laterality Date   APPENDECTOMY     CHOLECYSTECTOMY     TUBAL LIGATION       OB History     Gravida  2   Para  2   Term  1   Preterm  1   AB      Living  2      SAB      IAB      Ectopic      Multiple      Live Births              Family History  Problem Relation Age of Onset   Autism Son    Diabetes Maternal Grandmother    Stroke Maternal Grandmother    Heart failure Maternal Grandmother    Stroke Maternal Grandfather    Stroke Father    Seizures Father    Allergies Father        penicillin   Heart attack Father    Other Mother        herpes   Hyperlipidemia Mother    Heart failure Paternal Grandfather    Diabetes Paternal Grandmother    Hypertension Paternal Grandmother    Autism Brother    Other Paternal Uncle        heart  exploded   Cancer Maternal Aunt    Diabetes Maternal Aunt    Hypertension Maternal Aunt    Immunodeficiency Neg Hx    Urticaria Neg Hx    Eczema Neg Hx    Atopy Neg Hx    Asthma Neg Hx    Angioedema Neg Hx    Allergic rhinitis Neg Hx     Social History   Tobacco Use   Smoking status: Every Day    Packs/day: 0.50    Pack years: 0.00    Types: Cigarettes   Smokeless tobacco: Never  Vaping Use   Vaping Use: Never used  Substance Use Topics   Alcohol use: No   Drug use: No    Home Medications Prior to Admission medications   Medication Sig Start Date End Date Taking? Authorizing Provider  Brexpiprazole (REXULTI) 0.5 MG TABS Take 1 tablet (0.5 mg total) by mouth daily. 12/07/20  Yes [name], Fransisca Kaufmann, MD  cephALEXin (KEFLEX) 500 MG capsule Take 1 capsule (500 mg total) by mouth 2 (two) times daily for 5 days. 01/18/21 01/23/21 Yes Emalia Witkop, Carola Rhine, MD   citalopram (CELEXA) 40 MG tablet Take 1 tablet (40 mg total) by mouth daily. 12/07/20  Yes [name], Fransisca Kaufmann, MD  albuterol (VENTOLIN HFA) 108 (90 Base) MCG/ACT inhaler Use 4 puffs every 4-6 hours as needed for cough or wheeze. Patient not taking: No sig reported 05/15/19   Valentina Shaggy, MD  EPINEPHrine 0.3 mg/0.3 mL IJ SOAJ injection Inject 0.3 mLs (0.3 mg total) into the muscle as needed for anaphylaxis. Patient not taking: No sig reported 11/13/19   Valentina Shaggy, MD  fluticasone [facility]) 50 MCG/ACT nasal spray Place 2 sprays into both nostrils daily. Patient not taking: No sig reported 11/30/19   Wurst, [place], PA-C  ondansetron (ZOFRAN) 4 MG tablet Take 1 tablet (4 mg total) by mouth every 8 (eight) hours as needed for nausea or vomiting. Patient not taking: No sig reported 12/22/20   Chevis Pretty, FNP  traZODone (DESYREL) 50 MG tablet Take 0.5-1 tablets (25-50 mg total) by mouth at bedtime as needed for sleep. Patient not taking: No sig reported 12/07/20   [name], Fransisca Kaufmann, MD    Allergies    Doxycycline, Wellbutrin [bupropion], Catfish [fish allergy], and Tape  Review of Systems   Review of Systems  Constitutional:  Negative for chills and fever.  HENT:  Negative for ear pain and sore throat.   Eyes:  Negative for pain and visual disturbance.  Respiratory:  Negative for cough and shortness of breath.   Cardiovascular:  Negative for chest pain and palpitations.  Gastrointestinal:  Negative for abdominal pain and vomiting.  Genitourinary:  Negative for dysuria and hematuria.  Musculoskeletal:  Negative for arthralgias and back pain.  Skin:  Negative for color change and rash.  Neurological:  Positive for seizures. Negative for syncope, facial asymmetry, light-headedness and headaches.  All other systems reviewed and are negative.  Physical Exam Updated Vital Signs BP (!) 103/58 (BP Location: Left Arm)   Pulse 62   Temp 98.2 F (36.8 C) (Oral)    Resp 18   Ht 5\' 2"  (1.575 m)   Wt 72.6 kg   SpO2 99%   BMI 29.26 kg/m   Physical Exam Constitutional:      General: Jaime Flores is not in acute distress. HENT:     Head: Normocephalic and atraumatic.  Eyes:     Conjunctiva/sclera: Conjunctivae normal.     Pupils: Pupils are equal,  round, and reactive to light.  Cardiovascular:     Rate and Rhythm: Normal rate and regular rhythm.  Pulmonary:     Effort: Pulmonary effort is normal. No respiratory distress.  Abdominal:     General: There is no distension.     Tenderness: There is no abdominal tenderness.  Skin:    General: Skin is warm and dry.  Neurological:     General: No focal deficit present.     Mental Status: Jaime Flores is alert and oriented to person, place, and time. Mental status is at baseline.     Sensory: No sensory deficit.     Motor: No weakness.  Psychiatric:        Mood and Affect: Mood normal.        Behavior: Behavior normal.    ED Results / Procedures / Treatments   Labs (all labs ordered are listed, but only abnormal results are displayed) Labs Reviewed  COMPREHENSIVE METABOLIC PANEL - Abnormal; Notable for the following components:      Result Value   Total Bilirubin 0.2 (*)    All other components within normal limits  URINALYSIS, ROUTINE W REFLEX MICROSCOPIC - Abnormal; Notable for the following components:   Color, Urine STRAW (*)    APPearance HAZY (*)    Specific Gravity, Urine 1.004 (*)    Hgb urine dipstick SMALL (*)    Leukocytes,Ua MODERATE (*)    Bacteria, UA RARE (*)    All other components within normal limits  URINE CULTURE  CBC WITH DIFFERENTIAL/PLATELET  MAGNESIUM    EKG None  Radiology No results found.  Procedures Procedures   Medications Ordered in ED Medications  cephALEXin (KEFLEX) capsule 500 mg (500 mg Oral Given 01/18/21 1111)    ED Course  I have reviewed the triage vital signs and the nursing notes.  Pertinent labs & imaging results that were available during my care  of the patient were reviewed by me and considered in my medical decision making (see chart for details).  Pt presenting with possible seizure-like activity at work, clinical presentation very similar to prior pseudoseizures  EEG reviewed from 08/31/20 - normal per Dr Rhea Belton note  Metabolic and infectious w/u performed - notable for possible UTI. Started on keflex here.  Otherwise labs looked okay.  No head trauma reported, no headache, doubt ICH Likewise clinically I doubt ACS, arrhythmia, PE, anemia, or other life threatening emergency at this time  With her prior neurology workup and hx of pseudoseizures, I suspect this is a recurring episode in the setting of a urine infection.  Jaime Flores was monitored in the ED with no further episodes Step-father present at bedside  Clinical Course as of 01/18/21 1645  Wed Jan 18, 2021  1056 Patient is labs indicate that there may be a UTI here.  Otherwise labs are unremarkable.  No elevation or effect of liver enzymes. [MT]  1057 [email or web address]-[email or web address].[email or web address] There are some data to suggest use of IgM levels solely for diagnosis of RMSF may not be accurate and result in false positives.  It does not appear that Jaime Flores ever developed true symptoms of [facility] spotted fever.  Given her allergies to doxycycline and a lack of viable alternative, I would not attempt to treat empirically based on this single lab test. [MT]  1142 Work-up and plan of action discussed with the patient and her stepfather at the bedside.  They are in agreement this plan.  We also discussed the [facility] work-up, and they agree  to withhold further treatment for this result. [MT]  0051 Jaime Flores verbalized that Jaime Flores would not be driving.  A work note was provided.  Her stepfather will be taking her home. [MT]    Clinical Course User Index [MT] Yolande Skoda, Carola Rhine, MD    Final Clinical Impression(s) / ED Diagnoses Final diagnoses:  Seizure-like activity [facility])    Rx /  DC Orders ED Discharge Orders          Ordered    cephALEXin (KEFLEX) 500 MG capsule  2 times daily        01/18/21 1142             Wyvonnia Dusky, MD 01/18/21 1646

## 2021-01-18 NOTE — ED Notes (Signed)
Pt in bed, family at bedside, pt denies pain, pt states that she is ready to go home, verbalized d/c and follow up.

## 2021-01-18 NOTE — Discharge Instructions (Addendum)
You are diagnosed with a urine infection today.  I prescribed antibiotics which you will need to pick up and continue taking tonight.  Please complete the full course.  I would strongly recommend you do not drive for a period of 3-6 months, or until you are cleared to do so by your doctor or neurologist.

## 2021-01-18 NOTE — ED Triage Notes (Signed)
Pt to er via ems, per ems pt has a hx of pseudo seizure and had 4 seizures today.  Pt sitting up in bed, md at bedside.  Pt states that she was recently treated for [place] spotted fever, states that she wasn't treated with amox because she is allergic to doxy.  Pt states that she has a hx of pseudo seizures and had 4 of them today.  States that she gets them when she is stressed, states that this is the first one since end of January.

## 2021-01-19 ENCOUNTER — Telehealth: Payer: Self-pay | Admitting: *Deleted

## 2021-01-19 NOTE — Telephone Encounter (Signed)
Transition Care Management Follow-up Telephone Call Date of discharge and from where: 01/18/2021 - [facility] How have you been since you were released from the hospital? "I am okay" Any questions or concerns? No  Items Reviewed: Did the pt receive and understand the discharge instructions provided? Yes  Medications obtained and verified? Yes  Other? No  Any new allergies since your discharge? No  Dietary orders reviewed? No Do you have support at home? Yes    Functional Questionnaire: (I = Independent and D = Dependent) ADLs: I  Bathing/Dressing- I  Meal Prep- I  Eating- I  Maintaining continence- I  Transferring/Ambulation- I  Managing Meds- I  Follow up appointments reviewed:  PCP Hospital f/u appt confirmed? No   Specialist Hospital f/u appt confirmed? No   Are transportation arrangements needed? No  If their condition worsens, is the pt aware to call PCP or go to the Emergency Dept.? Yes Was the patient provided with contact information for the PCP's office or ED? Yes Was to pt encouraged to call back with questions or concerns? Yes

## 2021-01-20 LAB — URINE CULTURE: Culture: <10000 — AB

## 2021-02-04 ENCOUNTER — Emergency Department (HOSPITAL_COMMUNITY)
Admission: EM | Admit: 2021-02-04 | Discharge: 2021-02-05 | Disposition: A | Discharge status: Home / Self Care | Payer: Medicaid Other | Attending: Emergency Medicine | Admitting: Emergency Medicine

## 2021-02-04 ENCOUNTER — Other Ambulatory Visit: Payer: Self-pay

## 2021-02-04 ENCOUNTER — Encounter (HOSPITAL_COMMUNITY): Payer: Self-pay

## 2021-02-04 DIAGNOSIS — F17210 Nicotine dependence, cigarettes, uncomplicated: Secondary | ICD-10-CM | POA: Insufficient documentation

## 2021-02-04 DIAGNOSIS — R519 Headache, unspecified: Secondary | ICD-10-CM

## 2021-02-04 DIAGNOSIS — G43909 Migraine, unspecified, not intractable, without status migrainosus: Secondary | ICD-10-CM | POA: Diagnosis not present

## 2021-02-04 MED ORDER — METOCLOPRAMIDE HCL 5 MG/ML IJ SOLN
10.0000 mg | Freq: Once | INTRAMUSCULAR | Status: AC
  Administered 2021-02-04: 10 mg via INTRAMUSCULAR
  Filled 2021-02-04: qty 2

## 2021-02-04 MED ORDER — DIPHENHYDRAMINE HCL 25 MG PO CAPS
50.0000 mg | ORAL_CAPSULE | Freq: Once | ORAL | Status: AC
  Administered 2021-02-04: 50 mg via ORAL
  Filled 2021-02-04: qty 2

## 2021-02-04 MED ORDER — DEXAMETHASONE 4 MG PO TABS
10.0000 mg | ORAL_TABLET | Freq: Once | ORAL | Status: AC
  Administered 2021-02-04: 10 mg via ORAL
  Filled 2021-02-04: qty 3

## 2021-02-04 MED ORDER — KETOROLAC TROMETHAMINE 60 MG/2ML IM SOLN
60.0000 mg | Freq: Once | INTRAMUSCULAR | Status: AC
  Administered 2021-02-04: 60 mg via INTRAMUSCULAR
  Filled 2021-02-04: qty 2

## 2021-02-04 NOTE — ED Triage Notes (Signed)
Pt reports headache that started around 7 pm tonight, says it feels like migraine, pt took 400mg  ibuprofen prior to arrival.

## 2021-02-06 ENCOUNTER — Telehealth: Payer: Self-pay

## 2021-02-06 NOTE — ED Provider Notes (Signed)
[facility] EMERGENCY DEPARTMENT Provider Note   CSN: [id number] Arrival date & time: 02/04/21  2248     History Chief Complaint  Patient presents with   Migraine    Jaime Flores is a 35 y.o. female.   Migraine This is a recurrent problem. The current episode started 3 to 5 hours ago. The problem occurs constantly. The problem has not changed since onset.Pertinent negatives include no chest pain, no abdominal pain, no headaches and no shortness of breath. Nothing aggravates the symptoms. Nothing relieves the symptoms. She has tried nothing for the symptoms. The treatment provided no relief.      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   Anxiety 11/18/2019   Depression, recurrent ([place]) 11/18/2019   Encounter for gynecological examination with Papanicolaou smear of cervix 06/03/2019   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   Cough 05/15/2019   Seasonal and perennial allergic rhinitis 05/15/2019   Bug bite 02/06/2019   Hemorrhoids 09/26/2016   Irregular intermenstrual bleeding 07/28/2015   Amenorrhea 01/27/2013    Past Surgical History:  Procedure Laterality Date   APPENDECTOMY     CHOLECYSTECTOMY     TUBAL LIGATION       OB History     Gravida  2   Para  2   Term  1   Preterm  1   AB      Living  2      SAB      IAB      Ectopic      Multiple      Live Births              Family History  Problem Relation Age of Onset   Autism Son     Diabetes Maternal Grandmother    Stroke Maternal Grandmother    Heart failure Maternal Grandmother    Stroke Maternal Grandfather    Stroke Father    Seizures Father    Allergies Father        penicillin   Heart attack Father    Other Mother        herpes   Hyperlipidemia Mother    Heart failure Paternal Grandfather    Diabetes Paternal Grandmother    Hypertension Paternal Grandmother    Autism Brother    Other Paternal Uncle        heart exploded   Cancer Maternal Aunt    Diabetes Maternal Aunt    Hypertension Maternal Aunt    Immunodeficiency Neg Hx    Urticaria Neg Hx    Eczema Neg Hx    Atopy Neg Hx    Asthma Neg Hx    Angioedema Neg Hx    Allergic rhinitis Neg Hx     Social History   Tobacco Use   Smoking status: Every Day    Packs/day: 0.50    Types: Cigarettes   Smokeless tobacco: Never  Vaping Use   Vaping Use: Never used  Substance Use Topics   Alcohol use:  No   Drug use: No    Home Medications Prior to Admission medications   Medication Sig Start Date End Date Taking? Authorizing Provider  Brexpiprazole (REXULTI) 0.5 MG TABS Take 1 tablet (0.5 mg total) by mouth daily. 12/07/20   [name], Fransisca Kaufmann, MD  citalopram (CELEXA) 40 MG tablet Take 1 tablet (40 mg total) by mouth daily. 12/07/20   [name], Fransisca Kaufmann, MD  EPINEPHrine 0.3 mg/0.3 mL IJ SOAJ injection Inject 0.3 mLs (0.3 mg total) into the muscle as needed for anaphylaxis. Patient not taking: No sig reported 11/13/19   Valentina Shaggy, MD    Allergies    Doxycycline, Wellbutrin [bupropion], Catfish [fish allergy], and Tape  Review of Systems   Review of Systems  Respiratory:  Negative for shortness of breath.   Cardiovascular:  Negative for chest pain.  Gastrointestinal:  Negative for abdominal pain.  Neurological:  Negative for headaches.  All other systems reviewed and are negative.  Physical Exam Updated Vital Signs BP 112/68   Pulse 77   Temp 97.7 F (36.5 C) (Oral)   Resp  20   Ht 5\' 2"  (1.575 m)   Wt 73.9 kg   SpO2 100%   BMI 29.81 kg/m   Physical Exam Vitals and nursing note reviewed.  Constitutional:      Appearance: She is well-developed.  HENT:     Head: Normocephalic and atraumatic.     Mouth/Throat:     Mouth: Mucous membranes are moist.     Pharynx: Oropharynx is clear.  Eyes:     Pupils: Pupils are equal, round, and reactive to light.  Cardiovascular:     Rate and Rhythm: Normal rate and regular rhythm.  Pulmonary:     Effort: No respiratory distress.     Breath sounds: No stridor.  Abdominal:     General: Abdomen is flat. There is no distension.  Musculoskeletal:        General: No swelling or tenderness. Normal range of motion.     Cervical back: Normal range of motion.  Skin:    General: Skin is warm and dry.  Neurological:     Mental Status: She is alert.     Comments: No altered mental status, able to give full seemingly accurate history.  Face is symmetric, EOM's intact, pupils equal and reactive, vision intact, tongue and uvula midline without deviation. Upper and Lower extremity motor 5/5, intact pain perception in distal extremities, 2+ reflexes in biceps, patella and achilles tendons. Able to perform finger to nose normal with both hands. Walks without assistance or evident ataxia.      ED Results / Procedures / Treatments   Labs (all labs ordered are listed, but only abnormal results are displayed) Labs Reviewed - No data to display  EKG None  Radiology No results found.  Procedures Procedures   Medications Ordered in ED Medications  metoCLOPramide (REGLAN) injection 10 mg (10 mg Intramuscular Given 02/04/21 2352)  ketorolac (TORADOL) injection 60 mg (60 mg Intramuscular Given 02/04/21 2352)  diphenhydrAMINE (BENADRYL) capsule 50 mg (50 mg Oral Given 02/04/21 2351)  dexamethasone (DECADRON) tablet 10 mg (10 mg Oral Given 02/04/21 2351)    ED Course  I have reviewed the triage vital signs and the nursing  notes.  Pertinent labs & imaging results that were available during my care of the patient were reviewed by me and considered in my medical decision making (see chart for details).    MDM Rules/Calculators/A&P  The patient arrived with signs and symptoms consistent with a migraine headache. The patient has history of migraines. This feels like previous migraines.The patient has a reassuring neuro exam lessening chances of intracranial abnormalities. The patient was given decadron, Reglan, Benadryl and will go home to sleep off migraine. The patient remained in no acute distress, hemodynamically stable with reassuring neuro exam. The patient was then discharged from the Emergency Department with no further acute issues. Recommend follow up with neurologist or PCP within the next week.  Final Clinical Impression(s) / ED Diagnoses Final diagnoses:  Bad headache    Rx / DC Orders ED Discharge Orders     None        Kiandre Spagnolo, Corene Cornea, MD 02/06/21 0100

## 2021-02-06 NOTE — Telephone Encounter (Signed)
Transition Care Management Unsuccessful Follow-up Telephone Call  Date of discharge and from where:  02/05/2021-[facility] ED   Attempts:  1st Attempt  Reason for unsuccessful TCM follow-up call:  Left voice message

## 2021-02-07 ENCOUNTER — Other Ambulatory Visit: Payer: Self-pay

## 2021-02-07 ENCOUNTER — Emergency Department (HOSPITAL_COMMUNITY): Payer: Medicaid Other

## 2021-02-07 ENCOUNTER — Encounter (HOSPITAL_COMMUNITY): Payer: Self-pay | Admitting: Emergency Medicine

## 2021-02-07 ENCOUNTER — Emergency Department (HOSPITAL_COMMUNITY)
Admission: EM | Admit: 2021-02-07 | Discharge: 2021-02-08 | Disposition: A | Discharge status: Home / Self Care | Payer: Medicaid Other | Attending: Emergency Medicine | Admitting: Emergency Medicine

## 2021-02-07 DIAGNOSIS — F17210 Nicotine dependence, cigarettes, uncomplicated: Secondary | ICD-10-CM | POA: Diagnosis not present

## 2021-02-07 DIAGNOSIS — N898 Other specified noninflammatory disorders of vagina: Secondary | ICD-10-CM | POA: Insufficient documentation

## 2021-02-07 DIAGNOSIS — R0689 Other abnormalities of breathing: Secondary | ICD-10-CM | POA: Diagnosis not present

## 2021-02-07 DIAGNOSIS — R072 Precordial pain: Secondary | ICD-10-CM | POA: Insufficient documentation

## 2021-02-07 DIAGNOSIS — R200 Anesthesia of skin: Secondary | ICD-10-CM | POA: Diagnosis not present

## 2021-02-07 DIAGNOSIS — R457 State of emotional shock and stress, unspecified: Secondary | ICD-10-CM | POA: Diagnosis not present

## 2021-02-07 DIAGNOSIS — R079 Chest pain, unspecified: Secondary | ICD-10-CM

## 2021-02-07 DIAGNOSIS — E876 Hypokalemia: Secondary | ICD-10-CM

## 2021-02-07 DIAGNOSIS — R0789 Other chest pain: Secondary | ICD-10-CM | POA: Diagnosis not present

## 2021-02-07 LAB — BASIC METABOLIC PANEL
Anion gap: 8 (ref 5–15)
BUN: <5 mg/dL — ABNORMAL LOW (ref 6–20)
CO2: 26 mmol/L (ref 22–32)
Calcium: 8.7 mg/dL — ABNORMAL LOW (ref 8.9–10.3)
Chloride: 104 mmol/L (ref 98–111)
Creatinine, Ser: 0.8 mg/dL (ref 0.44–1.00)
GFR, Estimated: >60 mL/min (ref >60)
Glucose, Bld: 109 mg/dL — ABNORMAL HIGH (ref 70–99)
Potassium: 3 mmol/L — ABNORMAL LOW (ref 3.5–5.1)
Sodium: 138 mmol/L (ref 135–145)

## 2021-02-07 LAB — CBC
HCT: 39.5 % (ref 36.0–46.0)
Hemoglobin: 12.3 g/dL (ref 12.0–15.0)
MCH: 28.3 pg (ref 26.0–34.0)
MCHC: 31.1 g/dL (ref 30.0–36.0)
MCV: 91 fL (ref 80.0–100.0)
Platelets: 343 10*3/uL (ref 150–400)
RBC: 4.34 MIL/uL (ref 3.87–5.11)
RDW: 14.9 % (ref 11.5–15.5)
WBC: 11.4 10*3/uL — ABNORMAL HIGH (ref 4.0–10.5)
nRBC: 0 % (ref 0.0–0.2)

## 2021-02-07 LAB — TROPONIN I (HIGH SENSITIVITY): Troponin I (High Sensitivity): <2 ng/L (ref <18)

## 2021-02-07 LAB — HCG, SERUM, QUALITATIVE: Preg, Serum: NEGATIVE

## 2021-02-07 MED ORDER — POTASSIUM CHLORIDE CRYS ER 20 MEQ PO TBCR
40.0000 meq | EXTENDED_RELEASE_TABLET | Freq: Once | ORAL | Status: AC
  Administered 2021-02-07: 40 meq via ORAL
  Filled 2021-02-07: qty 2

## 2021-02-07 NOTE — Telephone Encounter (Signed)
Transition Care Management Follow-up Telephone Call Date of discharge and from where: 02/05/2021-[facility] ED  How have you been since you were released from the hospital? Doing a lot better  Any questions or concerns? No  Items Reviewed: Did the pt receive and understand the discharge instructions provided? Yes  Medications obtained and verified?  No medications given at discharge  Other? No  Any new allergies since your discharge? No  Dietary orders reviewed? N/A Do you have support at home? Yes   Home Care and Equipment/Supplies: Were home health services ordered? not applicable If so, what is the name of the agency? N/A  Has the agency set up a time to come to the patient's home? not applicable Were any new equipment or medical supplies ordered?  No What is the name of the medical supply agency? N/A Were you able to get the supplies/equipment? not applicable Do you have any questions related to the use of the equipment or supplies? No  Functional Questionnaire: (I = Independent and D = Dependent) ADLs: I  Bathing/Dressing- I  Meal Prep- I  Eating- I  Maintaining continence- I  Transferring/Ambulation- I  Managing Meds- I  Follow up appointments reviewed:  PCP Hospital f/u appt confirmed? No   Specialist Hospital f/u appt confirmed? Yes  Scheduled to see Butler Denmark, NP on 02/22/2021 @ 2:45 PM. Are transportation arrangements needed? No  If their condition worsens, is the pt aware to call PCP or go to the Emergency Dept.? Yes Was the patient provided with contact information for the PCP's office or ED? Yes Was to pt encouraged to call back with questions or concerns? Yes

## 2021-02-07 NOTE — Discharge Instructions (Signed)
Follow-up closely with your primary doctor and return for new concerns.

## 2021-02-07 NOTE — ED Provider Notes (Signed)
[facility] EMERGENCY DEPARTMENT Provider Note   CSN: [id number] Arrival date & time: 02/07/21  2206     History Chief Complaint  Patient presents with   Anxiety    Jaime Flores is a 35 y.o. female.  Patient with history of cigarette smoking, urticaria history presents after episode of midsternal chest pain and burning sensation that started when she was laying down to go to sleep.  Lasted approximate 1/2 hours, started at 8:00.  No history of similar.  Father had a heart attack less than age 60.  No cardiac history known to the patient.  No exertional symptoms recently.  Patient no signs or symptoms currently.      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   Anxiety 11/18/2019   Depression, recurrent ([place]) 11/18/2019   Encounter for gynecological examination with Papanicolaou smear of cervix 06/03/2019   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   Cough 05/15/2019   Seasonal and perennial allergic rhinitis 05/15/2019   Bug bite 02/06/2019   Hemorrhoids 09/26/2016   Irregular intermenstrual bleeding 07/28/2015   Amenorrhea 01/27/2013    Past Surgical History:  Procedure Laterality Date   APPENDECTOMY     CHOLECYSTECTOMY     TUBAL LIGATION       OB History     Gravida  2   Para  2   Term  1   Preterm  1   AB      Living  2      SAB      IAB      Ectopic      Multiple      Live Births              Family History  Problem Relation Age of Onset    Autism Son    Diabetes Maternal Grandmother    Stroke Maternal Grandmother    Heart failure Maternal Grandmother    Stroke Maternal Grandfather    Stroke Father    Seizures Father    Allergies Father        penicillin   Heart attack Father    Other Mother        herpes   Hyperlipidemia Mother    Heart failure Paternal Grandfather    Diabetes Paternal Grandmother    Hypertension Paternal Grandmother    Autism Brother    Other Paternal Uncle        heart exploded   Cancer Maternal Aunt    Diabetes Maternal Aunt    Hypertension Maternal Aunt    Immunodeficiency Neg Hx    Urticaria Neg Hx    Eczema Neg Hx    Atopy Neg Hx    Asthma Neg Hx    Angioedema Neg Hx    Allergic rhinitis Neg Hx     Social History   Tobacco Use   Smoking status: Every Day    Packs/day: 0.50    Types: Cigarettes   Smokeless tobacco: Never  Vaping Use   Vaping Use:  Never used  Substance Use Topics   Alcohol use: No   Drug use: No    Home Medications Prior to Admission medications   Medication Sig Start Date End Date Taking? Authorizing Provider  Brexpiprazole (REXULTI) 0.5 MG TABS Take 1 tablet (0.5 mg total) by mouth daily. 12/07/20   [name], Fransisca Kaufmann, MD  citalopram (CELEXA) 40 MG tablet Take 1 tablet (40 mg total) by mouth daily. 12/07/20   [name], Fransisca Kaufmann, MD  EPINEPHrine 0.3 mg/0.3 mL IJ SOAJ injection Inject 0.3 mLs (0.3 mg total) into the muscle as needed for anaphylaxis. Patient not taking: No sig reported 11/13/19   Valentina Shaggy, MD    Allergies    Doxycycline, Wellbutrin [bupropion], Catfish [fish allergy], and Tape  Review of Systems   Review of Systems  Constitutional:  Negative for chills and fever.  HENT:  Negative for congestion.   Eyes:  Negative for visual disturbance.  Respiratory:  Negative for shortness of breath.   Cardiovascular:  Positive for chest pain. Negative for leg swelling.  Gastrointestinal:  Negative for abdominal pain and vomiting.   Genitourinary:  Negative for dysuria and flank pain.  Musculoskeletal:  Negative for back pain, neck pain and neck stiffness.  Skin:  Negative for rash.  Neurological:  Negative for light-headedness and headaches.   Physical Exam Updated Vital Signs BP 112/61   Pulse 61   Temp 98.1 F (36.7 C) (Oral)   Resp 18   Ht 5\' 2"  (1.575 m)   Wt 73 kg   LMP 01/07/2021   SpO2 95%   BMI 29.44 kg/m   Physical Exam Vitals and nursing note reviewed.  Constitutional:      General: She is not in acute distress.    Appearance: She is well-developed.  HENT:     Head: Normocephalic and atraumatic.     Mouth/Throat:     Mouth: Mucous membranes are moist.  Eyes:     General:        Right eye: No discharge.        Left eye: No discharge.     Conjunctiva/sclera: Conjunctivae normal.  Neck:     Trachea: No tracheal deviation.  Cardiovascular:     Rate and Rhythm: Normal rate and regular rhythm.     Heart sounds: No murmur heard. Pulmonary:     Effort: Pulmonary effort is normal.     Breath sounds: Normal breath sounds.  Abdominal:     General: There is no distension.     Palpations: Abdomen is soft.     Tenderness: There is no abdominal tenderness. There is no guarding.  Musculoskeletal:        General: No swelling.     Cervical back: Normal range of motion and neck supple. No rigidity.  Skin:    General: Skin is warm.     Capillary Refill: Capillary refill takes less than 2 seconds.     Findings: No rash.  Neurological:     General: No focal deficit present.     Mental Status: She is alert.     Cranial Nerves: No cranial nerve deficit.  Psychiatric:        Mood and Affect: Mood normal.    ED Results / Procedures / Treatments   Labs (all labs ordered are listed, but only abnormal results are displayed) Labs Reviewed  BASIC METABOLIC PANEL - Abnormal; Notable for the following components:      Result Value   Potassium 3.0 (*)  Glucose, Bld 109 (*)    BUN <5 (*)     Calcium 8.7 (*)    All other components within normal limits  CBC - Abnormal; Notable for the following components:   WBC 11.4 (*)    All other components within normal limits  HCG, SERUM, QUALITATIVE  TROPONIN I (HIGH SENSITIVITY)    EKG EKG Interpretation  Date/Time:  Tuesday February 07 2021 22:18:35 EDT Ventricular Rate:  65 PR Interval:  156 QRS Duration: 109 QT Interval:  376 QTC Calculation: 391 R Axis:   40 Text Interpretation: Sinus rhythm Left atrial enlargement Low voltage, precordial leads Borderline T abnormalities, anterior leads Confirmed by Elnora Morrison [phone]) on 02/07/2021 10:49:36 PM  Radiology DG Chest 2 View  Result Date: 02/07/2021 CLINICAL DATA:  Central chest pain.  Left arm tingling and numbness. EXAM: CHEST - 2 VIEW COMPARISON:  Radiograph 08/04/2020 FINDINGS: The cardiomediastinal contours are normal. The lungs are clear. Pulmonary vasculature is normal. No consolidation, pleural effusion, or pneumothorax. No acute osseous abnormalities are seen. IMPRESSION: Negative radiographs of the chest. Electronically Signed   By: Keith Rake M.D.   On: 02/07/2021 22:49    Procedures Procedures   Medications Ordered in ED Medications  potassium chloride SA (KLOR-CON) CR tablet 40 mEq (has no administration in time range)    ED Course  I have reviewed the triage vital signs and the nursing notes.  Pertinent labs & imaging results that were available during my care of the patient were reviewed by me and considered in my medical decision making (see chart for details).    MDM Rules/Calculators/A&P                           Patient presents with acute chest pain episode clinical concern for reflux, musculoskeletal, gastric related, less likely ACS given atypical nature, age and low risk.  Patient is a cigarette smoker plan for delta troponin since recent onset.  Initial troponin negative reviewed.  EKG nonspecific flattening overall similar to previous of T  waves.  No signs or symptoms currently.  Other blood work reviewed potassium mild low 3.0, oral potassium ordered.  Hemoglobin and white blood cell count normal.  Patient care be signed out to follow-up second troponin if negative discharged to follow-up with primary doctor.   Final Clinical Impression(s) / ED Diagnoses Final diagnoses:  Hypokalemia  Acute chest pain    Rx / DC Orders ED Discharge Orders     None        Elnora Morrison, MD 02/07/21 2340

## 2021-02-07 NOTE — ED Triage Notes (Signed)
Pt c/o increased anxiety since being out of Celexa x 3 days.

## 2021-02-07 NOTE — ED Notes (Signed)
Pt placed on cardiac monitor with BP to set cycle every 30 minutes. Continuous pulse oximeter applied.  

## 2021-02-07 NOTE — ED Notes (Signed)
Pt reports midsternal chest pain and burning of gradual onset that started while laying down to go to sleep. Pt reports intermittent stabbing pain and left arm tingling. Pt took ASA 324mg  at 2100 per direction of 911 operator. PT reports feeling really hot but denies nausea, sob, or diaphoresis. Pt denies similar hx of this pain. Pt is out of her Celexa and is awaiting a refill by pharmacy.

## 2021-02-08 ENCOUNTER — Telehealth: Payer: Self-pay | Admitting: Family Medicine

## 2021-02-08 LAB — TROPONIN I (HIGH SENSITIVITY): Troponin I (High Sensitivity): <2 ng/L (ref <18)

## 2021-02-08 NOTE — Telephone Encounter (Signed)
Appointment made for tomorrow.  Patient wanted ASAP appt with Dr. Warrick Parisian or Mary-Margaret [name]

## 2021-02-08 NOTE — ED Provider Notes (Signed)
  Physical Exam  BP 108/68   Pulse (!) 54   Temp 98.1 F (36.7 C) (Oral)   Resp 16   Ht 5\' 2"  (1.575 m)   Wt 73 kg   LMP 01/07/2021   SpO2 98%   BMI 29.44 kg/m   Physical Exam  ED Course/Procedures     Procedures  MDM   Care assumed from Dr. Reather Converse at shift change.  Patient awaiting results of second troponin which has returned and is negative.  Patient to be discharged with outpatient follow-up as needed.       Veryl Speak, MD 02/08/21 0200

## 2021-02-09 ENCOUNTER — Other Ambulatory Visit: Payer: Self-pay

## 2021-02-09 ENCOUNTER — Telehealth: Payer: Self-pay

## 2021-02-09 ENCOUNTER — Ambulatory Visit (INDEPENDENT_AMBULATORY_CARE_PROVIDER_SITE_OTHER): Payer: Medicaid Other | Admitting: Nurse Practitioner

## 2021-02-09 ENCOUNTER — Encounter: Payer: Self-pay | Admitting: Nurse Practitioner

## 2021-02-09 VITALS — BP 108/65 | HR 76 | Temp 98.1°F | Resp 20 | Ht 62.0 in | Wt 156.0 lb

## 2021-02-09 DIAGNOSIS — F339 Major depressive disorder, recurrent, unspecified: Secondary | ICD-10-CM

## 2021-02-09 DIAGNOSIS — F419 Anxiety disorder, unspecified: Secondary | ICD-10-CM | POA: Diagnosis not present

## 2021-02-09 DIAGNOSIS — Z09 Encounter for follow-up examination after completed treatment for conditions other than malignant neoplasm: Secondary | ICD-10-CM

## 2021-02-09 NOTE — Progress Notes (Signed)
Subjective:    Patient ID: Jaime Flores, female    DOB: 05-10-1986, 35 y.o.   MRN: [id number]   Chief Complaint: Hospitalization Follow-up   HPI Patient went to the ED on 02/04/21 with migraine headache. She was given reglan and toradol and discharged home. She went back to the ED on 02/07/21 with anxiety and chest pain. All labs and test were negative. Was discharged home on no meds and was told to see her PCP. Her potassium was low at last visit.  She is suppose to be on celexa and rexulti, but has not picked up her refills. She has been out of meds for over a week.  She is under a lot of stress at work. She has an autistic son that she is having trouble finding someone to help take care of him. Depression screen [facility] 2/9 02/09/2021 08/26/2020 08/26/2020  Decreased Interest 1 1 0  Down, Depressed, Hopeless 3 3 1   PHQ - 2 Score 4 4 1   Altered sleeping 3 3 -  Tired, decreased energy 3 3 -  Change in appetite 2 3 -  Feeling bad or failure about yourself  3 2 -  Trouble concentrating 3 0 -  Moving slowly or fidgety/restless 3 0 -  Suicidal thoughts 2 0 -  PHQ-9 Score 23 15 -  Difficult doing work/chores Extremely dIfficult - -   GAD 7 : Generalized Anxiety Score 02/09/2021 08/26/2020  Nervous, Anxious, on Edge 3 3  Control/stop worrying 3 3  Worry too much - different things 3 3  Trouble relaxing 3 3  Restless 3 0  Easily annoyed or irritable 3 3  Afraid - awful might happen 3 0  Total GAD 7 Score 21 15  Anxiety Difficulty Extremely difficult -        Review of Systems  Constitutional:  Negative for diaphoresis.  Eyes:  Negative for pain.  Respiratory:  Negative for shortness of breath.   Cardiovascular:  Negative for chest pain, palpitations and leg swelling.  Gastrointestinal:  Negative for abdominal pain.  Endocrine: Negative for polydipsia.  Skin:  Negative for rash.  Neurological:  Negative for dizziness, weakness and headaches.  Hematological:  Does not bruise/bleed  easily.  All other systems reviewed and are negative.     Objective:   Physical Exam Vitals and nursing note reviewed.  Constitutional:      General: She is not in acute distress.    Appearance: Normal appearance. She is well-developed.  HENT:     Head: Normocephalic.     Right Ear: Tympanic membrane normal.     Left Ear: Tympanic membrane normal.     Nose: Nose normal.     Mouth/Throat:     Mouth: Mucous membranes are moist.  Eyes:     Pupils: Pupils are equal, round, and reactive to light.  Neck:     Vascular: No carotid bruit or JVD.  Cardiovascular:     Rate and Rhythm: Normal rate and regular rhythm.     Heart sounds: Normal heart sounds.  Pulmonary:     Effort: Pulmonary effort is normal. No respiratory distress.     Breath sounds: Normal breath sounds. No wheezing or rales.  Chest:     Chest wall: No tenderness.  Abdominal:     General: Bowel sounds are normal. There is no distension or abdominal bruit.     Palpations: Abdomen is soft. There is no hepatomegaly, splenomegaly, mass or pulsatile mass.  Tenderness: There is no abdominal tenderness.  Musculoskeletal:        General: Normal range of motion.     Cervical back: Normal range of motion and neck supple.  Lymphadenopathy:     Cervical: No cervical adenopathy.  Skin:    General: Skin is warm and dry.  Neurological:     Mental Status: She is alert and oriented to person, place, and time.     Deep Tendon Reflexes: Reflexes are normal and symmetric.  Psychiatric:        Behavior: Behavior normal.        Thought Content: Thought content normal.        Judgment: Judgment normal.    BP 108/65   Pulse 76   Temp 98.1 F (36.7 C) (Temporal)   Resp 20   Ht 5\' 2"  (1.575 m)   Wt 156 lb (70.8 kg)   SpO2 98%   BMI 28.53 kg/m        Assessment & Plan:  NAVEENA EYMAN in today with chief complaint of Hospitalization Follow-up   1. Anxiety Get back on meds  2. Depression, recurrent ([place]) Stress  management  3. Hospital discharge follow-up Hospital records reviewed  Referral to CCM to see if can get help with autistic child    The above assessment and management plan was discussed with the patient. The patient verbalized understanding of and has agreed to the management plan. Patient is aware to call the clinic if symptoms persist or worsen. Patient is aware when to return to the clinic for a follow-up visit. Patient educated on when it is appropriate to go to the emergency department.   Mary-Margaret [name], FNP

## 2021-02-09 NOTE — Patient Instructions (Signed)
Textbook of family medicine (9th ed., pp. 1062-1073). [place], [place]: [employer].">  Stress, Adult Stress is a normal reaction to life events. Stress is what you feel when life demands more than you are used to, or more than you think you can handle. Some stress can be useful, such as studying for a test or meeting a deadline at work. Stress that occurs too often or for too long can cause problems. It can affect your emotional health and interfere with relationships and normal daily activities. Too much stress can weaken your body's defense system (immune system) and increase your risk for physical illness. If you already have a medicalproblem, stress can make it worse. What are the causes? All sorts of life events can cause stress. An event that causes stress for one person may not be stressful for another person. Major life events, whether positive or negative, commonly cause stress. Examples include: Losing a job or starting a new job. Losing a loved one. Moving to a new town or home. Getting married or divorced. Having a baby. Getting injured or sick. Less obvious life events can also cause stress, especially if they occur day after day or in combination with each other. Examples include: Working long hours. Driving in traffic. Caring for children. Being in debt. Being in a difficult relationship. What are the signs or symptoms? Stress can cause emotional symptoms, including: Anxiety. This is feeling worried, afraid, on edge, overwhelmed, or out of control. Anger, including irritation or impatience. Depression. This is feeling sad, down, helpless, or guilty. Trouble focusing, remembering, or making decisions. Stress can cause physical symptoms, including: Aches and pains. These may affect your head, neck, back, stomach, or other areas of your body. Tight muscles or a clenched jaw. Low energy. Trouble sleeping. Stress can cause unhealthy behaviors, including: Eating to feel better  (overeating) or skipping meals. Working too much or putting off tasks. Smoking, drinking alcohol, or using drugs to feel better. How is this diagnosed? Stress is diagnosed through an assessment by your health care provider. He or she may diagnose this condition based on: Your symptoms and any stressful life events. Your medical history. Tests to rule out other causes of your symptoms. Depending on your condition, your health care provider may refer you to aspecialist for further evaluation. How is this treated?  Stress management techniques are the recommended treatment for stress. Medicineis not typically recommended for the treatment of stress. Techniques to reduce your reaction to stressful life events include: Stress identification. Monitor yourself for symptoms of stress and identify what causes stress for you. These skills may help you to avoid or prepare for stressful events. Time management. Set your priorities, keep a calendar of events, and learn to say no. Taking these actions can help you avoid making too many commitments. Techniques for coping with stress include: Rethinking the problem. Try to think realistically about stressful events rather than ignoring them or overreacting. Try to find the positives in a stressful situation rather than focusing on the negatives. Exercise. Physical exercise can release both physical and emotional tension. The key is to find a form of exercise that you enjoy and do it regularly. Relaxation techniques. These relax the body and mind. The key is to find one or more that you enjoy and use the techniques regularly. Examples include: Meditation, deep breathing, or progressive relaxation techniques. Yoga or tai chi. Biofeedback, mindfulness techniques, or journaling. Listening to music, being out in nature, or participating in other hobbies. Practicing a healthy lifestyle.   Eat a balanced diet, drink plenty of water, limit or avoid caffeine, and get  plenty of sleep. Having a strong support network. Spend time with family, friends, or other people you enjoy being around. Express your feelings and talk things over with someone you trust. Counseling or talk therapy with a mental health professional may be helpful if you are havingtrouble managing stress on your own. Follow these instructions at home: Lifestyle  Avoid drugs. Do not use any products that contain nicotine or tobacco, such as cigarettes, e-cigarettes, and chewing tobacco. If you need help quitting, ask your health care provider. Limit alcohol intake to no more than 1 drink a day for nonpregnant women and 2 drinks a day for men. One drink equals 12 oz of beer, 5 oz of wine, or 1 oz of hard liquor Do not use alcohol or drugs to relax. Eat a balanced diet that includes fresh fruits and vegetables, whole grains, lean meats, fish, eggs, and beans, and low-fat dairy. Avoid processed foods and foods high in added fat, sugar, and salt. Exercise at least 30 minutes on 5 or more days each week. Get 7-8 hours of sleep each night.  General instructions  Practice stress management techniques as discussed with your health care provider. Drink enough fluid to keep your urine clear or pale yellow. Take over-the-counter and prescription medicines only as told by your health care provider. Keep all follow-up visits as told by your health care provider. This is important.  Contact a health care provider if: Your symptoms get worse. You have new symptoms. You feel overwhelmed by your problems and can no longer manage them on your own. Get help right away if: You have thoughts of hurting yourself or others. If you ever feel like you may hurt yourself or others, or have thoughts about taking your own life, get help right away. You can go to your nearest emergency department or call: Your local emergency services (911 in the [place] A suicide crisis helpline, such as the [facility] at 4253100524. This is open 24 hours a day. Summary Stress is a normal reaction to life events. It can cause problems if it happens too often or for too long. Practicing stress management techniques is the best way to treat stress. Counseling or talk therapy with a mental health professional may be helpful if you are having trouble managing stress on your own. This information is not intended to replace advice given to you by your health care provider. Make sure you discuss any questions you have with your healthcare provider. Document Revised: 03/25/2020 Document Reviewed: 03/25/2020 Elsevier Patient Education  2022 [employer].

## 2021-02-09 NOTE — Telephone Encounter (Signed)
Transition Care Management Unsuccessful Follow-up Telephone Call  Date of discharge and from where:  02/08/2021-[facility] ED   Attempts:  1st Attempt  Reason for unsuccessful TCM follow-up call:  Left voice message

## 2021-02-10 NOTE — Telephone Encounter (Signed)
Transition Care Management Unsuccessful Follow-up Telephone Call  Date of discharge and from where:  02/08/2021-[facility] ED   Attempts:  2nd Attempt  Reason for unsuccessful TCM follow-up call:  Left voice message

## 2021-02-13 ENCOUNTER — Other Ambulatory Visit: Payer: Self-pay

## 2021-02-13 DIAGNOSIS — F339 Major depressive disorder, recurrent, unspecified: Secondary | ICD-10-CM

## 2021-02-13 NOTE — Telephone Encounter (Signed)
Transition Care Management Unsuccessful Follow-up Telephone Call  Date of discharge and from where:  02/08/2021 from [facility]  Attempts:  3rd Attempt  Reason for unsuccessful TCM follow-up call:  Unable to reach patient

## 2021-02-22 ENCOUNTER — Ambulatory Visit: Payer: Medicaid Other | Admitting: Neurology

## 2021-02-23 ENCOUNTER — Other Ambulatory Visit: Payer: Self-pay

## 2021-02-23 ENCOUNTER — Emergency Department (HOSPITAL_COMMUNITY)
Admission: EM | Admit: 2021-02-23 | Discharge: 2021-02-23 | Disposition: A | Discharge status: Home / Self Care | Payer: Medicaid Other | Attending: Emergency Medicine | Admitting: Emergency Medicine

## 2021-02-23 ENCOUNTER — Encounter (HOSPITAL_COMMUNITY): Payer: Self-pay

## 2021-02-23 ENCOUNTER — Telehealth: Payer: Self-pay | Admitting: Family Medicine

## 2021-02-23 DIAGNOSIS — R569 Unspecified convulsions: Secondary | ICD-10-CM | POA: Insufficient documentation

## 2021-02-23 DIAGNOSIS — F17210 Nicotine dependence, cigarettes, uncomplicated: Secondary | ICD-10-CM | POA: Diagnosis not present

## 2021-02-23 DIAGNOSIS — G40909 Epilepsy, unspecified, not intractable, without status epilepticus: Secondary | ICD-10-CM | POA: Diagnosis not present

## 2021-02-23 DIAGNOSIS — I959 Hypotension, unspecified: Secondary | ICD-10-CM | POA: Diagnosis not present

## 2021-02-23 DIAGNOSIS — R457 State of emotional shock and stress, unspecified: Secondary | ICD-10-CM | POA: Diagnosis not present

## 2021-02-23 LAB — BASIC METABOLIC PANEL
Anion gap: 6 (ref 5–15)
BUN: 7 mg/dL (ref 6–20)
CO2: 26 mmol/L (ref 22–32)
Calcium: 8.4 mg/dL — ABNORMAL LOW (ref 8.9–10.3)
Chloride: 106 mmol/L (ref 98–111)
Creatinine, Ser: 0.74 mg/dL (ref 0.44–1.00)
GFR, Estimated: >60 mL/min (ref >60)
Glucose, Bld: 103 mg/dL — ABNORMAL HIGH (ref 70–99)
Potassium: 3.4 mmol/L — ABNORMAL LOW (ref 3.5–5.1)
Sodium: 138 mmol/L (ref 135–145)

## 2021-02-23 LAB — CBC WITH DIFFERENTIAL/PLATELET
Abs Immature Granulocytes: 0.03 10*3/uL (ref 0.00–0.07)
Basophils Absolute: 0 10*3/uL (ref 0.0–0.1)
Basophils Relative: 0 %
Eosinophils Absolute: 0.2 10*3/uL (ref 0.0–0.5)
Eosinophils Relative: 2 %
HCT: 39 % (ref 36.0–46.0)
Hemoglobin: 12.2 g/dL (ref 12.0–15.0)
Immature Granulocytes: 0 %
Lymphocytes Relative: 26 %
Lymphs Abs: 2.6 10*3/uL (ref 0.7–4.0)
MCH: 28.4 pg (ref 26.0–34.0)
MCHC: 31.3 g/dL (ref 30.0–36.0)
MCV: 90.9 fL (ref 80.0–100.0)
Monocytes Absolute: 0.5 10*3/uL (ref 0.1–1.0)
Monocytes Relative: 5 %
Neutro Abs: 6.9 10*3/uL (ref 1.7–7.7)
Neutrophils Relative %: 67 %
Platelets: 337 10*3/uL (ref 150–400)
RBC: 4.29 MIL/uL (ref 3.87–5.11)
RDW: 15.4 % (ref 11.5–15.5)
WBC: 10.2 10*3/uL (ref 4.0–10.5)
nRBC: 0 % (ref 0.0–0.2)

## 2021-02-23 MED ORDER — SODIUM CHLORIDE 0.9 % IV BOLUS
500.0000 mL | Freq: Once | INTRAVENOUS | Status: AC
  Administered 2021-02-23: 500 mL via INTRAVENOUS

## 2021-02-23 NOTE — ED Provider Notes (Signed)
[facility] EMERGENCY DEPARTMENT Provider Note   CSN: [id number] Arrival date & time: 02/23/21  1545     History Chief Complaint  Patient presents with   Seizures    Jaime Flores is a 35 y.o. female.  HPI Patient states she is here because at work today she had multiple seizures.  She states that she laid down because she felt some tingling in her thighs.  This is a typical prodrome before she has seizures.  She states that her seizures were witnessed by her coworkers, and at one point she "had trouble breathing."  She is unsure exactly what her seizures were like today.  She states she has been having multiple seizures recently.  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  She does state that she has not been sleeping as well as usual.  She works as a Geologist, engineering at [employer].  She denies recent illnesses including fever, chills, sore throat, cough, shortness of breath, nausea or vomiting.  She had an appointment with her neurologist yesterday but it was canceled due to a scheduling snafu.  There are no other known active modifying factors.    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   Anxiety 11/18/2019   Depression, recurrent ([place]) 11/18/2019   Encounter for gynecological examination with Papanicolaou smear of cervix 06/03/2019   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   Cough 05/15/2019   Seasonal and perennial  allergic rhinitis 05/15/2019   Bug bite 02/06/2019   Hemorrhoids 09/26/2016   Irregular intermenstrual bleeding 07/28/2015   Amenorrhea 01/27/2013    Past Surgical History:  Procedure Laterality Date   APPENDECTOMY     CHOLECYSTECTOMY     TUBAL LIGATION       OB History     Gravida  2   Para  2   Term  1   Preterm  1   AB      Living  2      SAB      IAB      Ectopic      Multiple      Live Births              Family History  Problem Relation Age of Onset   Autism Son    Diabetes Maternal Grandmother    Stroke Maternal Grandmother    Heart failure Maternal Grandmother    Stroke Maternal Grandfather    Stroke Father    Seizures Father    Allergies Father        penicillin   Heart attack Father    Other Mother        herpes   Hyperlipidemia Mother    Heart failure Paternal Grandfather    Diabetes Paternal Grandmother    Hypertension Paternal Grandmother    Autism Brother    Other Paternal Uncle        heart exploded   Cancer Maternal  Aunt    Diabetes Maternal Aunt    Hypertension Maternal Aunt    Immunodeficiency Neg Hx    Urticaria Neg Hx    Eczema Neg Hx    Atopy Neg Hx    Asthma Neg Hx    Angioedema Neg Hx    Allergic rhinitis Neg Hx     Social History   Tobacco Use   Smoking status: Every Day    Packs/day: 0.50    Types: Cigarettes   Smokeless tobacco: Never  Vaping Use   Vaping Use: Never used  Substance Use Topics   Alcohol use: No   Drug use: No    Home Medications Prior to Admission medications   Medication Sig Start Date End Date Taking? Authorizing Provider  Brexpiprazole (REXULTI) 0.5 MG TABS Take 1 tablet (0.5 mg total) by mouth daily. 12/07/20  Yes [name], Fransisca Kaufmann, MD  citalopram (CELEXA) 40 MG tablet Take 1 tablet (40 mg total) by mouth daily. 12/07/20  Yes [name], Fransisca Kaufmann, MD  EPINEPHrine 0.3 mg/0.3 mL IJ SOAJ injection Inject 0.3 mLs (0.3 mg total) into the muscle as needed for anaphylaxis. Patient  not taking: No sig reported 11/13/19   Valentina Shaggy, MD    Allergies    Catfish [fish allergy], Doxycycline, Wellbutrin [bupropion], and Tape  Review of Systems   Review of Systems  All other systems reviewed and are negative.  Physical Exam Updated Vital Signs BP 113/66   Pulse 62   Temp 98.4 F (36.9 C) (Oral)   Resp 19   Ht '5\' 2"'$  (1.575 m)   Wt 70.8 kg   SpO2 97%   BMI 28.53 kg/m   Physical Exam Vitals and nursing note reviewed.  Constitutional:      Appearance: She is well-developed. She is not ill-appearing.  HENT:     Head: Normocephalic and atraumatic.     Right Ear: External ear normal.     Left Ear: External ear normal.  Eyes:     Conjunctiva/sclera: Conjunctivae normal.     Pupils: Pupils are equal, round, and reactive to light.  Neck:     Trachea: Phonation normal.  Cardiovascular:     Rate and Rhythm: Normal rate and regular rhythm.     Heart sounds: Normal heart sounds.  Pulmonary:     Effort: Pulmonary effort is normal.     Breath sounds: Normal breath sounds.  Abdominal:     Palpations: Abdomen is soft.     Tenderness: There is no abdominal tenderness.  Musculoskeletal:        General: Normal range of motion.     Cervical back: Normal range of motion and neck supple.  Skin:    General: Skin is warm and dry.  Neurological:     Mental Status: She is alert and oriented to person, place, and time.     Cranial Nerves: No cranial nerve deficit.     Sensory: No sensory deficit.     Motor: No abnormal muscle tone.     Coordination: Coordination normal.  Psychiatric:        Mood and Affect: Mood normal.        Behavior: Behavior normal.        Thought Content: Thought content normal.        Judgment: Judgment normal.    ED Results / Procedures / Treatments   Labs (all labs ordered are listed, but only abnormal results are displayed) Labs Reviewed  BASIC METABOLIC PANEL - Abnormal; Notable  for the following components:      Result Value    Potassium 3.4 (*)    Glucose, Bld 103 (*)    Calcium 8.4 (*)    All other components within normal limits  CBC WITH DIFFERENTIAL/PLATELET    EKG None  Radiology No results found.  Procedures Procedures   Medications Ordered in ED Medications  sodium chloride 0.9 % bolus 500 mL (500 mLs Intravenous New Bag/Given 02/23/21 1651)    ED Course  I have reviewed the triage vital signs and the nursing notes.  Pertinent labs & imaging results that were available during my care of the patient were reviewed by me and considered in my medical decision making (see chart for details).  Clinical Course as of 02/23/21 1923  Thu Feb 23, 2021  1649 I was called to the room because the patient was having a seizure.  The nurse was with her during the entirety of this episode.  The nurse states that the patient "zoned out, then began shaking on her arms and head.  The entire episode lasted for 30 seconds." [EW]    Clinical Course User Index [EW] Daleen Bo, MD   MDM Rules/Calculators/A&P                            Patient Vitals for the past 24 hrs:  BP Temp Temp src Pulse Resp SpO2 Height Weight  02/23/21 1730 113/66 -- -- 62 19 97 % -- --  02/23/21 1700 107/65 -- -- 65 (!) 21 96 % -- --  02/23/21 1550 121/72 98.4 F (36.9 C) Oral 69 17 99 % -- --  02/23/21 1548 -- -- -- -- -- -- '5\' 2"'$  (1.575 m) 70.8 kg    7:22 PM Reevaluation with update and discussion. After initial assessment and treatment, an updated evaluation reveals she is sleeping and arouses easily.  She has no further complaints.  There have been no additional episodes.  Findings discussed with the patient and all questions were answered. 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 The differential diagnoses include insomnia, occult  medical disorder, progressive stress. 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  She is taking her usual medications.  She has a history of depression.  I did not require additional historical information from anyone.  Clinical Laboratory Tests Ordered, included CBC and Metabolic panel. Review indicates normal.   Critical Interventions-clinical evaluation, laboratory testing, observation  After These Interventions, the Patient was reevaluated and was found crying evaluation is reassuring.  No evidence for progressive seizure disorder.  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  No indication for hospitalization at this time.  CRITICAL CARE-no Performed by: Daleen Bo  Nursing Notes Reviewed/ Care Coordinated Applicable Imaging Reviewed Interpretation of Laboratory Data incorporated into ED treatment  The patient appears reasonably screened and/or stabilized for discharge and I doubt any other medical condition or other [facility] requiring further screening, evaluation, or treatment in the ED at this time prior to discharge.  Plan: Home Medications-continue usual; Home Treatments-rest, fluids, avoidance of; return here if the recommended treatment, does not improve the symptoms; Recommended follow up-PCP, neurology as needed     Final Clinical Impression(s) / ED Diagnoses Final diagnoses:  Pseudoseizures ([place])  Rx / DC Orders ED Discharge Orders     None        Daleen Bo, MD 02/23/21 [phone]

## 2021-02-23 NOTE — ED Notes (Signed)
Warm blanket given to pt

## 2021-02-23 NOTE — Discharge Instructions (Signed)
See your PCP, and neurologist for further care and treatment as needed.

## 2021-02-23 NOTE — ED Notes (Signed)
Pt started seizing at 1648. Seizure lasted 30 seconds. started with a gaze that turned into a tonic-clonicseizure. Dr. Eulis Foster notified.

## 2021-02-23 NOTE — ED Triage Notes (Addendum)
BIB [facility] c/o seizures at work. Per coworker, she's had 10 seizures today. 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 EMS reports no postictal period and pt is a/o x4 on arrival.

## 2021-02-23 NOTE — Telephone Encounter (Signed)
Pt has an appt scheduled 8/18 at 9:10

## 2021-02-24 ENCOUNTER — Telehealth: Payer: Self-pay

## 2021-02-24 NOTE — Telephone Encounter (Signed)
Transition Care Management Unsuccessful Follow-up Telephone Call  Date of discharge and from where:  02/23/2021-[facility] ED  Attempts:  1st Attempt  Reason for unsuccessful TCM follow-up call:  Left voice message

## 2021-02-26 ENCOUNTER — Emergency Department (HOSPITAL_COMMUNITY)
Admission: EM | Admit: 2021-02-26 | Discharge: 2021-02-27 | Disposition: A | Discharge status: Home / Self Care | Payer: Medicaid Other | Attending: Emergency Medicine | Admitting: Emergency Medicine

## 2021-02-26 ENCOUNTER — Encounter (HOSPITAL_COMMUNITY): Payer: Self-pay | Admitting: Emergency Medicine

## 2021-02-26 ENCOUNTER — Other Ambulatory Visit: Payer: Self-pay

## 2021-02-26 ENCOUNTER — Emergency Department (HOSPITAL_COMMUNITY): Payer: Medicaid Other

## 2021-02-26 DIAGNOSIS — Y92002 Bathroom of unspecified non-institutional (private) residence single-family (private) house as the place of occurrence of the external cause: Secondary | ICD-10-CM | POA: Diagnosis not present

## 2021-02-26 DIAGNOSIS — W01198A Fall on same level from slipping, tripping and stumbling with subsequent striking against other object, initial encounter: Secondary | ICD-10-CM | POA: Diagnosis not present

## 2021-02-26 DIAGNOSIS — R569 Unspecified convulsions: Secondary | ICD-10-CM

## 2021-02-26 DIAGNOSIS — R519 Headache, unspecified: Secondary | ICD-10-CM | POA: Diagnosis present

## 2021-02-26 DIAGNOSIS — F17210 Nicotine dependence, cigarettes, uncomplicated: Secondary | ICD-10-CM | POA: Insufficient documentation

## 2021-02-26 DIAGNOSIS — G40909 Epilepsy, unspecified, not intractable, without status epilepticus: Secondary | ICD-10-CM | POA: Diagnosis not present

## 2021-02-26 DIAGNOSIS — Y93E1 Activity, personal bathing and showering: Secondary | ICD-10-CM | POA: Diagnosis not present

## 2021-02-26 HISTORY — DX: Personal history of other specified conditions: Z87.898

## 2021-02-26 LAB — CBC WITH DIFFERENTIAL/PLATELET
Abs Immature Granulocytes: 0.04 10*3/uL (ref 0.00–0.07)
Basophils Absolute: 0.1 10*3/uL (ref 0.0–0.1)
Basophils Relative: 1 %
Eosinophils Absolute: 0.2 10*3/uL (ref 0.0–0.5)
Eosinophils Relative: 2 %
HCT: 39.5 % (ref 36.0–46.0)
Hemoglobin: 12.4 g/dL (ref 12.0–15.0)
Immature Granulocytes: 0 %
Lymphocytes Relative: 31 %
Lymphs Abs: 3.6 10*3/uL (ref 0.7–4.0)
MCH: 29 pg (ref 26.0–34.0)
MCHC: 31.4 g/dL (ref 30.0–36.0)
MCV: 92.3 fL (ref 80.0–100.0)
Monocytes Absolute: 0.5 10*3/uL (ref 0.1–1.0)
Monocytes Relative: 5 %
Neutro Abs: 7.3 10*3/uL (ref 1.7–7.7)
Neutrophils Relative %: 61 %
Platelets: 327 10*3/uL (ref 150–400)
RBC: 4.28 MIL/uL (ref 3.87–5.11)
RDW: 15.5 % (ref 11.5–15.5)
WBC: 11.8 10*3/uL — ABNORMAL HIGH (ref 4.0–10.5)
nRBC: 0 % (ref 0.0–0.2)

## 2021-02-26 LAB — COMPREHENSIVE METABOLIC PANEL
ALT: 26 U/L (ref 0–44)
AST: 20 U/L (ref 15–41)
Albumin: 3.7 g/dL (ref 3.5–5.0)
Alkaline Phosphatase: 85 U/L (ref 38–126)
Anion gap: 6 (ref 5–15)
BUN: 8 mg/dL (ref 6–20)
CO2: 27 mmol/L (ref 22–32)
Calcium: 8.6 mg/dL — ABNORMAL LOW (ref 8.9–10.3)
Chloride: 103 mmol/L (ref 98–111)
Creatinine, Ser: 0.8 mg/dL (ref 0.44–1.00)
GFR, Estimated: >60 mL/min (ref >60)
Glucose, Bld: 94 mg/dL (ref 70–99)
Potassium: 3.9 mmol/L (ref 3.5–5.1)
Sodium: 136 mmol/L (ref 135–145)
Total Bilirubin: 0.4 mg/dL (ref 0.3–1.2)
Total Protein: 6.8 g/dL (ref 6.5–8.1)

## 2021-02-26 NOTE — ED Notes (Signed)
Radiology called to eport Pt developed seizure like activity on CT table. Lasted apprx 1 min. Pt A+O X 4 at this time. EDP Notified. Pt has recent Hx of pseudoseizure.

## 2021-02-26 NOTE — ED Triage Notes (Signed)
Pt reports hitting head in while having seizure in shower. Pt c/o left sided head pain and nausea. Pt has hx of multiple seizures/day. Pt A/O at this time.

## 2021-02-26 NOTE — ED Provider Notes (Signed)
MSE note.  Patient with history of pseudoseizures.  Patient had a seizure in the restroom hit her head.  No loss of consciousness.  Physical exam patient alert oriented x3.  X-rays and labs ordered   Milton Vallejo, MD 02/26/21 2224

## 2021-02-27 ENCOUNTER — Telehealth: Payer: Self-pay

## 2021-02-27 MED ORDER — IBUPROFEN 800 MG PO TABS
800.0000 mg | ORAL_TABLET | Freq: Once | ORAL | Status: AC
  Administered 2021-02-27: 800 mg via ORAL
  Filled 2021-02-27: qty 1

## 2021-02-27 MED ORDER — ACETAMINOPHEN 500 MG PO TABS
1000.0000 mg | ORAL_TABLET | Freq: Once | ORAL | Status: AC
  Administered 2021-02-27: 1000 mg via ORAL
  Filled 2021-02-27: qty 2

## 2021-02-27 NOTE — Telephone Encounter (Signed)
Tried calling pt. No answer. Left message for pt to return call.

## 2021-02-27 NOTE — Telephone Encounter (Signed)
I believe she already has a neurologist, she does need to continue seeing neurology, believe she already has 1

## 2021-02-27 NOTE — ED Provider Notes (Signed)
[facility] EMERGENCY DEPARTMENT Provider Note   CSN: [id number] Arrival date & time: 02/26/21  2117     History No chief complaint on file.   Jaime Flores is a 35 y.o. female.  Patient presents to the emergency department for evaluation of head injury.  Patient reports that she had a seizure while she was in the shower and hit her head.  She is complaining of headache.      Past Medical History:  Diagnosis Date   Amenorrhea 01/27/2013   Angio-edema    Breast pain 03/10/2015   Encounter for menstrual regulation 07/05/2015   History of pseudoseizure    Irregular intermenstrual bleeding 07/28/2015   Irregular periods 07/05/2015   Screening for STD (sexually transmitted disease) 08/07/2013   Urticaria    UTI (urinary tract infection)    Vaginal discharge 06/10/2013    Patient Active Problem List   Diagnosis Date Noted   Alteration consciousness 08/23/2020   Prediabetes 02/15/2020   Reactive hypoglycemia 02/15/2020   Anxiety 11/18/2019   Depression, recurrent ([place]) 11/18/2019   Encounter for gynecological examination with Papanicolaou smear of cervix 06/03/2019   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   Cough 05/15/2019   Seasonal and perennial allergic rhinitis 05/15/2019   Bug bite 02/06/2019   Hemorrhoids 09/26/2016   Irregular intermenstrual bleeding 07/28/2015   Amenorrhea 01/27/2013    Past Surgical History:  Procedure Laterality Date   APPENDECTOMY     CHOLECYSTECTOMY     TUBAL LIGATION       OB History     Gravida  2   Para  2   Term  1   Preterm  1   AB      Living  2      SAB      IAB      Ectopic      Multiple      Live Births              Family History  Problem Relation Age of Onset   Autism Son    Diabetes Maternal Grandmother    Stroke Maternal Grandmother    Heart failure Maternal Grandmother    Stroke Maternal Grandfather    Stroke Father    Seizures Father    Allergies Father         penicillin   Heart attack Father    Other Mother        herpes   Hyperlipidemia Mother    Heart failure Paternal Grandfather    Diabetes Paternal Grandmother    Hypertension Paternal Grandmother    Autism Brother    Other Paternal Uncle        heart exploded   Cancer Maternal Aunt    Diabetes Maternal Aunt    Hypertension Maternal Aunt    Immunodeficiency Neg Hx    Urticaria Neg Hx    Eczema Neg Hx    Atopy Neg Hx    Asthma Neg Hx    Angioedema Neg Hx    Allergic rhinitis Neg Hx     Social History   Tobacco Use   Smoking status: Every Day    Packs/day: 0.50    Types: Cigarettes   Smokeless tobacco: Never  Vaping Use   Vaping Use: Never used  Substance Use Topics   Alcohol use: No   Drug use: No    Home Medications Prior to Admission medications   Medication Sig Start Date End Date Taking? Authorizing Provider  Brexpiprazole (REXULTI) 0.5 MG TABS Take 1 tablet (0.5 mg total) by mouth daily. 12/07/20   [name], Fransisca Kaufmann, MD  citalopram (CELEXA) 40 MG tablet Take 1 tablet (40 mg total) by mouth daily. 12/07/20   [name], Fransisca Kaufmann, MD  EPINEPHrine 0.3 mg/0.3 mL IJ SOAJ injection Inject 0.3 mLs (0.3 mg total) into the muscle as needed for anaphylaxis. Patient not taking: No sig reported 11/13/19   Valentina Shaggy, MD    Allergies    Catfish [fish allergy], Doxycycline, Wellbutrin [bupropion], and Tape  Review of Systems   Review of Systems  Neurological:  Positive for seizures.  All other systems reviewed and are negative.  Physical Exam Updated Vital Signs BP 114/69   Pulse 70   Temp 98.3 F (36.8 C)   Resp 18   Ht '5\' 2"'$  (1.575 m)   Wt 70.8 kg   SpO2 98%   BMI 28.53 kg/m   Physical Exam Vitals and nursing note reviewed.  Constitutional:      General: She is not in acute distress.    Appearance: Normal appearance. She is well-developed.  HENT:     Head: Normocephalic and atraumatic.     Right Ear: Hearing normal.     Left Ear: Hearing  normal.     Nose: Nose normal.  Eyes:     Conjunctiva/sclera: Conjunctivae normal.     Pupils: Pupils are equal, round, and reactive to light.  Cardiovascular:     Rate and Rhythm: Regular rhythm.     Heart sounds: S1 normal and S2 normal. No murmur heard.   No friction rub. No gallop.  Pulmonary:     Effort: Pulmonary effort is normal. No respiratory distress.     Breath sounds: Normal breath sounds.  Chest:     Chest wall: No tenderness.  Abdominal:     General: Bowel sounds are normal.     Palpations: Abdomen is soft.     Tenderness: There is no abdominal tenderness. There is no guarding or rebound. Negative signs include Murphy's sign and McBurney's sign.     Hernia: No hernia is present.  Musculoskeletal:        General: Normal range of motion.     Cervical back: Normal range of motion and neck supple.  Skin:    General: Skin is warm and dry.     Findings: No rash.  Neurological:     Mental Status: She is alert and oriented to person, place, and time.     GCS: GCS eye subscore is 4. GCS verbal subscore is 5. GCS motor subscore is 6.     Cranial Nerves: No cranial nerve deficit.     Sensory: No sensory deficit.     Coordination: Coordination normal.  Psychiatric:        Speech: Speech normal.        Behavior: Behavior normal.        Thought Content: Thought content normal.    ED Results / Procedures / Treatments   Labs (all labs ordered are listed, but only abnormal results are displayed) Labs Reviewed  CBC WITH DIFFERENTIAL/PLATELET - Abnormal; Notable for the following components:      Result Value   WBC 11.8 (*)    All other components within normal limits  COMPREHENSIVE METABOLIC PANEL - Abnormal; Notable for the following components:   Calcium 8.6 (*)    All other components within normal limits  PREGNANCY, URINE    EKG None  Radiology CT HEAD  WO CONTRAST (5MM)  Result Date: 02/26/2021 CLINICAL DATA:  Seizure EXAM: CT HEAD WITHOUT CONTRAST CT CERVICAL  SPINE WITHOUT CONTRAST TECHNIQUE: Multidetector CT imaging of the head and cervical spine was performed following the standard protocol without intravenous contrast. Multiplanar CT image reconstructions of the cervical spine were also generated. COMPARISON:  MRI 09/20/2020, CT brain 08/04/2020 FINDINGS: CT HEAD FINDINGS Brain: No evidence of acute infarction, hemorrhage, hydrocephalus, extra-axial collection or mass lesion/mass effect. Vascular: No hyperdense vessel or unexpected calcification. Skull: Normal. Negative for fracture or focal lesion. Sinuses/Orbits: No acute finding. Other: None CT CERVICAL SPINE FINDINGS Alignment: Reversal of cervical lordosis. No subluxation. Facet alignment within normal limits Skull base and vertebrae: No acute fracture. No primary bone lesion or focal pathologic process. Soft tissues and spinal canal: No prevertebral fluid or swelling. No visible canal hematoma. Disc levels:  Within normal limits Upper chest: Negative. Other: None IMPRESSION: 1. Negative non contrasted CT appearance of the brain. 2. Reversal of cervical lordosis.  No acute osseous abnormality Electronically Signed   By: Donavan Foil M.D.   On: 02/26/2021 22:52   CT Cervical Spine Wo Contrast  Result Date: 02/26/2021 CLINICAL DATA:  Seizure EXAM: CT HEAD WITHOUT CONTRAST CT CERVICAL SPINE WITHOUT CONTRAST TECHNIQUE: Multidetector CT imaging of the head and cervical spine was performed following the standard protocol without intravenous contrast. Multiplanar CT image reconstructions of the cervical spine were also generated. COMPARISON:  MRI 09/20/2020, CT brain 08/04/2020 FINDINGS: CT HEAD FINDINGS Brain: No evidence of acute infarction, hemorrhage, hydrocephalus, extra-axial collection or mass lesion/mass effect. Vascular: No hyperdense vessel or unexpected calcification. Skull: Normal. Negative for fracture or focal lesion. Sinuses/Orbits: No acute finding. Other: None CT CERVICAL SPINE FINDINGS Alignment:  Reversal of cervical lordosis. No subluxation. Facet alignment within normal limits Skull base and vertebrae: No acute fracture. No primary bone lesion or focal pathologic process. Soft tissues and spinal canal: No prevertebral fluid or swelling. No visible canal hematoma. Disc levels:  Within normal limits Upper chest: Negative. Other: None IMPRESSION: 1. Negative non contrasted CT appearance of the brain. 2. Reversal of cervical lordosis.  No acute osseous abnormality Electronically Signed   By: Donavan Foil M.D.   On: 02/26/2021 22:52    Procedures Procedures   Medications Ordered in ED Medications  ibuprofen (ADVIL) tablet 800 mg (has no administration in time range)  acetaminophen (TYLENOL) tablet 1,000 mg (has no administration in time range)    ED Course  I have reviewed the triage vital signs and the nursing notes.  Pertinent labs & imaging results that were available during my care of the patient were reviewed by me and considered in my medical decision making (see chart for details).    MDM Rules/Calculators/A&P                           Alert and oriented.  Normal neurologic exam.  Imaging negative.  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  Currently under investigation by neurology, refer back to her neurologist.  Final Clinical Impression(s) / ED Diagnoses Final diagnoses:  Seizure-like activity [facility])    Rx / DC Orders ED Discharge Orders     None        Alaiyah Bollman, Gwenyth Allegra, MD 02/27/21 [phone]

## 2021-02-27 NOTE — Telephone Encounter (Signed)
Pt has been /scheduled for a telephone visit with [name] on 8/10. Pt states that she has had 20 seizures in the past two weeks. Her neurologist is unable to do any other treatments for her and that pt should follow up with PCP.  Pt knows that she is under a lot of stress and this is what is triggering the episodes. Also, caregiver for her son is not able to to take care of him any long.

## 2021-02-27 NOTE — Telephone Encounter (Signed)
Returnin nurse call. Please call [phone]

## 2021-02-27 NOTE — Telephone Encounter (Signed)
Pt's work called and states that pt has had two seizures this morning and wants to make [name] aware. Also needs pt to see a specialist to get help. Pt is at risk of loosing her job.  Informed work that they need to call 911. She has had 2 seizures since being at work this morning. Instructed to not let pt drive and that I would make [name] aware.  Pt seen at [facility] over the weekend and had CT-head/spine. TOC has been created but they have not been able to reach pt.  Pt has an appt with [name] on 8/18 for routine follow up.

## 2021-02-27 NOTE — Telephone Encounter (Signed)
Transition Care Management Unsuccessful Follow-up Telephone Call  Date of discharge and from where:  02/23/2021-[facility] ED   Attempts:  2nd Attempt  Reason for unsuccessful TCM follow-up call:  Left voice message

## 2021-02-28 ENCOUNTER — Telehealth: Payer: Self-pay

## 2021-02-28 NOTE — Telephone Encounter (Signed)
Transition Care Management Unsuccessful Follow-up Telephone Call  Date of discharge and from where:  02/23/2021 from [facility] ED  Attempts:  3rd Attempt  Reason for unsuccessful TCM follow-up call:  Unable to reach patient

## 2021-02-28 NOTE — Telephone Encounter (Signed)
Transition Care Management Unsuccessful Follow-up Telephone Call  Date of discharge and from where:  02/27/2021 from [facility]  Attempts:  1st Attempt  Reason for unsuccessful TCM follow-up call:  Left voice message

## 2021-03-01 ENCOUNTER — Encounter: Payer: Self-pay | Admitting: Family Medicine

## 2021-03-01 ENCOUNTER — Ambulatory Visit: Payer: Medicaid Other | Admitting: Family Medicine

## 2021-03-01 DIAGNOSIS — F419 Anxiety disorder, unspecified: Secondary | ICD-10-CM

## 2021-03-01 MED ORDER — HYDROXYZINE PAMOATE 25 MG PO CAPS: 25.0000 mg | ORAL_CAPSULE | Freq: Three times a day (TID) | ORAL | 2 refills | Status: DC | PRN

## 2021-03-01 MED ORDER — BREXPIPRAZOLE 1 MG PO TABS: 1.0000 mg | ORAL_TABLET | Freq: Every day | ORAL | 3 refills | Status: DC

## 2021-03-01 NOTE — Progress Notes (Signed)
Virtual Visit via telephone Note  I connected with Jaime Flores on [id number] at 1545 by telephone and verified that I am speaking with the correct person using two identifiers. Jaime Flores is currently located at home and patient are currently with her during visit. The provider, Fransisca Kaufmann Kyeisha Janowicz, MD is located in their office at time of visit.  Call ended at 1558  I discussed the limitations, risks, security and privacy concerns of performing an evaluation and management service by telephone and the availability of in person appointments. I also discussed with the patient that there may be a patient responsible charge related to this service. The patient expressed understanding and agreed to proceed.   History and Present Illness: Patient is having about 20 seizures in the past 2 weeks and has been in and out of the hospital with this.  The previous neurologist had an EEG and said that it was pseudoseizures.  She is so tired after.  She is not sleeping well at night.  She has burning sensations in her legs at times. She has even woken up with a seizures.  She shakes and convulses and it is full body.  She had 2 at work as well. 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 She has been anxious more recently.  She had stopped her medication 3 weeks ago.  She restarted her medicines 2 weeks ago and is building it back into system. She feels like her work is the [employer].   1. Anxiety     Outpatient Encounter Medications as of 03/01/2021  Medication Sig   brexpiprazole (REXULTI) 1 MG TABS tablet Take 1 tablet (1 mg total) by mouth daily.   hydrOXYzine (VISTARIL) 25 MG capsule Take 1 capsule (25 mg total) by mouth every 8 (eight) hours as needed for anxiety.   citalopram (CELEXA) 40 MG tablet Take 1 tablet (40 mg total) by mouth daily.   EPINEPHrine 0.3 mg/0.3 mL IJ SOAJ injection Inject 0.3 mLs (0.3 mg total) into the  muscle as needed for anaphylaxis. (Patient not taking: No sig reported)   [DISCONTINUED] Brexpiprazole (REXULTI) 0.5 MG TABS Take 1 tablet (0.5 mg total) by mouth daily.   No facility-administered encounter medications on file as of 03/01/2021.    Review of Systems  Constitutional:  Negative for chills and fever.  Eyes:  Negative for visual disturbance.  Respiratory:  Negative for chest tightness and shortness of breath.   Cardiovascular:  Negative for chest pain and leg swelling.  Musculoskeletal:  Negative for back pain and gait problem.  Skin:  Negative for rash.  Neurological:  Positive for seizures. Negative for light-headedness and headaches.  Psychiatric/Behavioral:  Positive for dysphoric mood and sleep disturbance. Negative for agitation, behavioral problems, self-injury and suicidal ideas. The patient is nervous/anxious.   All other systems reviewed and are negative.  Observations/Objective: Patient sounds comfortable and in no acute distress  Assessment and Plan: Problem List Items Addressed This Visit       Other   Anxiety - Primary   Relevant Medications   brexpiprazole (REXULTI) 1 MG TABS tablet   hydrOXYzine (VISTARIL) 25 MG capsule    Will increase Rexulti to 1 mg again hydroxyzine as needed. Follow up plan: Return in about 4 weeks (around 03/29/2021), or if symptoms worsen or fail to improve, for anxiety recheck.     I discussed the assessment and treatment plan  with the patient. The patient was provided an opportunity to ask questions and all were answered. The patient agreed with the plan and demonstrated an understanding of the instructions.   The patient was advised to call back or seek an in-person evaluation if the symptoms worsen or if the condition fails to improve as anticipated.  The above assessment and management plan was discussed with the patient. The patient verbalized understanding of and has agreed to the management plan. Patient is aware to call  the clinic if symptoms persist or worsen. Patient is aware when to return to the clinic for a follow-up visit. Patient educated on when it is appropriate to go to the emergency department.    I provided 13 minutes of non-face-to-face time during this encounter.    [name], MD

## 2021-03-01 NOTE — Telephone Encounter (Signed)
Transition Care Management Follow-up Telephone Call Date of discharge and from where: 02/27/2021 from [facility] How have you been since you were released from the hospital? Pt stated that she is still having seziures. Pt has an appt with PCP today. Pt states that she does not have any questions at this time and will keep appt today.  Any questions or concerns? No  Items Reviewed: Did the pt receive and understand the discharge instructions provided? Yes  Medications obtained and verified? Yes  Other? No  Any new allergies since your discharge? No  Dietary orders reviewed? No Do you have support at home? Yes   Functional Questionnaire: (I = Independent and D = Dependent) ADLs: I  Bathing/Dressing- I  Meal Prep- I  Eating- I  Maintaining continence- I  Transferring/Ambulation- I  Managing Meds- I   Follow up appointments reviewed:  PCP Hospital f/u appt confirmed? Yes  Scheduled to see Dr. Warrick Parisian on 03/01/2021 @ 3:45pm. Are transportation arrangements needed? No  If their condition worsens, is the pt aware to call PCP or go to the Emergency Dept.? Yes Was the patient provided with contact information for the PCP's office or ED? Yes Was to pt encouraged to call back with questions or concerns? Yes

## 2021-03-03 ENCOUNTER — Ambulatory Visit: Payer: Medicaid Other | Admitting: Nurse Practitioner

## 2021-03-07 ENCOUNTER — Telehealth: Payer: Self-pay | Admitting: Family Medicine

## 2021-03-07 NOTE — Telephone Encounter (Signed)
Pt is scheduled for 9/7 at 4:10

## 2021-03-07 NOTE — Telephone Encounter (Signed)
Left messge to callback

## 2021-03-08 ENCOUNTER — Encounter: Payer: Self-pay | Admitting: Family Medicine

## 2021-03-09 ENCOUNTER — Ambulatory Visit: Payer: Medicaid Other | Admitting: Family Medicine

## 2021-03-16 ENCOUNTER — Encounter: Payer: Self-pay | Admitting: Adult Health

## 2021-03-16 ENCOUNTER — Ambulatory Visit: Payer: Medicaid Other | Admitting: Adult Health

## 2021-03-29 ENCOUNTER — Ambulatory Visit (INDEPENDENT_AMBULATORY_CARE_PROVIDER_SITE_OTHER): Payer: Medicaid Other | Admitting: Family Medicine

## 2021-03-29 ENCOUNTER — Encounter: Payer: Self-pay | Admitting: Family Medicine

## 2021-03-29 DIAGNOSIS — F419 Anxiety disorder, unspecified: Secondary | ICD-10-CM

## 2021-03-29 DIAGNOSIS — F339 Major depressive disorder, recurrent, unspecified: Secondary | ICD-10-CM

## 2021-03-29 NOTE — Progress Notes (Signed)
Virtual Visit via telephone Note  I connected with Jaime Flores on [id number] at 1303 by telephone and verified that I am speaking with the correct person using two identifiers. Jaime Flores is currently located at home and patient are currently with her during visit. The provider, Fransisca Kaufmann Aylinn Rydberg, MD is located in their office at time of visit.  Call ended at 1310  I discussed the limitations, risks, security and privacy concerns of performing an evaluation and management service by telephone and the availability of in person appointments. I also discussed with the patient that there may be a patient responsible charge related to this service. The patient expressed understanding and agreed to proceed.   History and Present Illness: Anxiety She is having less pseudoseizures and her anxiety is a lot under control except for when she missed. She is sleeping well and has good appetite. She denies suicidal ideations. She wants to keep current dose  1. Anxiety   2. Depression, recurrent [facility])     Outpatient Encounter Medications as of 03/29/2021  Medication Sig   brexpiprazole (REXULTI) 1 MG TABS tablet Take 1 tablet (1 mg total) by mouth daily.   citalopram (CELEXA) 40 MG tablet Take 1 tablet (40 mg total) by mouth daily.   EPINEPHrine 0.3 mg/0.3 mL IJ SOAJ injection Inject 0.3 mLs (0.3 mg total) into the muscle as needed for anaphylaxis. (Patient not taking: No sig reported)   hydrOXYzine (VISTARIL) 25 MG capsule Take 1 capsule (25 mg total) by mouth every 8 (eight) hours as needed for anxiety.   No facility-administered encounter medications on file as of 03/29/2021.    Review of Systems  Constitutional:  Negative for chills and fever.  Eyes:  Negative for visual disturbance.  Respiratory:  Negative for chest tightness and shortness of breath.   Cardiovascular:  Negative for chest pain and leg swelling.  Genitourinary:  Negative for difficulty urinating and dysuria.  Skin:   Negative for rash.  Neurological:  Negative for light-headedness and headaches.  Psychiatric/Behavioral:  Positive for dysphoric mood and sleep disturbance. Negative for agitation, behavioral problems, self-injury and suicidal ideas. The patient is nervous/anxious.   All other systems reviewed and are negative.  Observations/Objective: Patient sounds comfortable and in no acute distress  Assessment and Plan: Problem List Items Addressed This Visit       Other   Anxiety - Primary   Depression, recurrent ([place])    Continue current medicine.  No changes.  Seems to be doing well. Follow up plan: Return in about 3 months (around 06/28/2021), or if symptoms worsen or fail to improve, for anxiety and prediabetes.     I discussed the assessment and treatment plan with the patient. The patient was provided an opportunity to ask questions and all were answered. The patient agreed with the plan and demonstrated an understanding of the instructions.   The patient was advised to call back or seek an in-person evaluation if the symptoms worsen or if the condition fails to improve as anticipated.  The above assessment and management plan was discussed with the patient. The patient verbalized understanding of and has agreed to the management plan. Patient is aware to call the clinic if symptoms persist or worsen. Patient is aware when to return to the clinic for a follow-up visit. Patient educated on when it is appropriate to go to the emergency department.    I provided 7 minutes of non-face-to-face time during this encounter.    Fransisca Kaufmann  Zan Orlick, MD

## 2021-04-29 ENCOUNTER — Emergency Department (HOSPITAL_COMMUNITY)
Admission: EM | Admit: 2021-04-29 | Discharge: 2021-04-30 | Disposition: A | Discharge status: Home / Self Care | Payer: Medicaid Other | Attending: Emergency Medicine | Admitting: Emergency Medicine

## 2021-04-29 ENCOUNTER — Encounter (HOSPITAL_COMMUNITY): Payer: Self-pay | Admitting: Emergency Medicine

## 2021-04-29 ENCOUNTER — Other Ambulatory Visit: Payer: Self-pay

## 2021-04-29 DIAGNOSIS — Y900 Blood alcohol level of less than 20 mg/100 ml: Secondary | ICD-10-CM | POA: Diagnosis not present

## 2021-04-29 DIAGNOSIS — R45851 Suicidal ideations: Secondary | ICD-10-CM | POA: Diagnosis not present

## 2021-04-29 DIAGNOSIS — F329 Major depressive disorder, single episode, unspecified: Secondary | ICD-10-CM | POA: Insufficient documentation

## 2021-04-29 DIAGNOSIS — R9431 Abnormal electrocardiogram [ECG] [EKG]: Secondary | ICD-10-CM | POA: Diagnosis not present

## 2021-04-29 DIAGNOSIS — F17210 Nicotine dependence, cigarettes, uncomplicated: Secondary | ICD-10-CM | POA: Insufficient documentation

## 2021-04-29 DIAGNOSIS — E876 Hypokalemia: Secondary | ICD-10-CM | POA: Diagnosis not present

## 2021-04-29 NOTE — ED Triage Notes (Addendum)
Pt c/o suicidal thoughts for the past 2 weeks. No plan at this time. Pt denies hx of same. States hx of depression and anxiety.    Pt states has had some big stressors lately with getting out of relationship and changing jobs.

## 2021-04-29 NOTE — ED Notes (Signed)
Pt has been wanded by security and is changing into purple scrubs now

## 2021-04-30 DIAGNOSIS — R45851 Suicidal ideations: Secondary | ICD-10-CM | POA: Diagnosis not present

## 2021-04-30 LAB — COMPREHENSIVE METABOLIC PANEL
ALT: 22 U/L (ref 0–44)
AST: 17 U/L (ref 15–41)
Albumin: 4 g/dL (ref 3.5–5.0)
Alkaline Phosphatase: 103 U/L (ref 38–126)
Anion gap: 7 (ref 5–15)
BUN: 8 mg/dL (ref 6–20)
CO2: 29 mmol/L (ref 22–32)
Calcium: 8.8 mg/dL — ABNORMAL LOW (ref 8.9–10.3)
Chloride: 102 mmol/L (ref 98–111)
Creatinine, Ser: 0.88 mg/dL (ref 0.44–1.00)
GFR, Estimated: >60 mL/min (ref >60)
Glucose, Bld: 84 mg/dL (ref 70–99)
Potassium: 3.1 mmol/L — ABNORMAL LOW (ref 3.5–5.1)
Sodium: 138 mmol/L (ref 135–145)
Total Bilirubin: 0.6 mg/dL (ref 0.3–1.2)
Total Protein: 7.4 g/dL (ref 6.5–8.1)

## 2021-04-30 LAB — CBC WITH DIFFERENTIAL/PLATELET
Abs Immature Granulocytes: 0.03 10*3/uL (ref 0.00–0.07)
Basophils Absolute: 0.1 10*3/uL (ref 0.0–0.1)
Basophils Relative: 1 %
Eosinophils Absolute: 0.3 10*3/uL (ref 0.0–0.5)
Eosinophils Relative: 2 %
HCT: 40.9 % (ref 36.0–46.0)
Hemoglobin: 12.8 g/dL (ref 12.0–15.0)
Immature Granulocytes: 0 %
Lymphocytes Relative: 28 %
Lymphs Abs: 3.4 10*3/uL (ref 0.7–4.0)
MCH: 29.1 pg (ref 26.0–34.0)
MCHC: 31.3 g/dL (ref 30.0–36.0)
MCV: 93 fL (ref 80.0–100.0)
Monocytes Absolute: 0.5 10*3/uL (ref 0.1–1.0)
Monocytes Relative: 4 %
Neutro Abs: 7.9 10*3/uL — ABNORMAL HIGH (ref 1.7–7.7)
Neutrophils Relative %: 65 %
Platelets: 361 10*3/uL (ref 150–400)
RBC: 4.4 MIL/uL (ref 3.87–5.11)
RDW: 15.3 % (ref 11.5–15.5)
WBC: 12.1 10*3/uL — ABNORMAL HIGH (ref 4.0–10.5)
nRBC: 0 % (ref 0.0–0.2)

## 2021-04-30 LAB — RAPID URINE DRUG SCREEN, HOSP PERFORMED
Amphetamines: NOT DETECTED
Barbiturates: NOT DETECTED
Benzodiazepines: NOT DETECTED
Cocaine: NOT DETECTED
Opiates: NOT DETECTED
Tetrahydrocannabinol: NOT DETECTED

## 2021-04-30 LAB — ETHANOL: Alcohol, Ethyl (B): <10 mg/dL (ref <10)

## 2021-04-30 LAB — POC URINE PREG, ED: Preg Test, Ur: NEGATIVE

## 2021-04-30 MED ORDER — NICOTINE 21 MG/24HR TD PT24: 21.0000 mg | MEDICATED_PATCH | Freq: Every day | TRANSDERMAL | Status: DC | PRN

## 2021-04-30 MED ORDER — ALUM & MAG HYDROXIDE-SIMETH 200-200-20 MG/5ML PO SUSP: 30.0000 mL | Freq: Four times a day (QID) | ORAL | Status: DC | PRN

## 2021-04-30 MED ORDER — CITALOPRAM HYDROBROMIDE 20 MG PO TABS
40.0000 mg | ORAL_TABLET | Freq: Every day | ORAL | Status: DC
  Administered 2021-04-30: 40 mg via ORAL
  Filled 2021-04-30: qty 2

## 2021-04-30 MED ORDER — ZOLPIDEM TARTRATE 5 MG PO TABS: 5.0000 mg | ORAL_TABLET | Freq: Every evening | ORAL | Status: DC | PRN

## 2021-04-30 MED ORDER — ONDANSETRON HCL 4 MG PO TABS: 4.0000 mg | ORAL_TABLET | Freq: Three times a day (TID) | ORAL | Status: DC | PRN

## 2021-04-30 MED ORDER — POTASSIUM CHLORIDE CRYS ER 20 MEQ PO TBCR
80.0000 meq | EXTENDED_RELEASE_TABLET | Freq: Two times a day (BID) | ORAL | Status: DC
  Administered 2021-04-30 (×2): 80 meq via ORAL
  Filled 2021-04-30 (×2): qty 4

## 2021-04-30 MED ORDER — BREXPIPRAZOLE 2 MG PO TABS
1.0000 mg | ORAL_TABLET | Freq: Every day | ORAL | Status: DC
  Administered 2021-04-30: 1 mg via ORAL
  Filled 2021-04-30 (×2): qty 1

## 2021-04-30 MED ORDER — HYDROXYZINE HCL 25 MG PO TABS: 25.0000 mg | ORAL_TABLET | Freq: Three times a day (TID) | ORAL | Status: DC | PRN

## 2021-04-30 MED ORDER — POTASSIUM CHLORIDE CRYS ER 20 MEQ PO TBCR: 40.0000 meq | EXTENDED_RELEASE_TABLET | Freq: Once | ORAL | Status: DC

## 2021-04-30 MED ORDER — ACETAMINOPHEN 325 MG PO TABS: 650.0000 mg | ORAL_TABLET | ORAL | Status: DC | PRN
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
## 2021-04-30 NOTE — ED Notes (Signed)
Pt given lunch tray at this time

## 2021-04-30 NOTE — ED Notes (Signed)
Tele psych assessment in progress

## 2021-04-30 NOTE — ED Notes (Signed)
Family requested a Dr note for pt job. Dr. Dina Rich approved note. Sister received note per pt  request .

## 2021-04-30 NOTE — ED Provider Notes (Signed)
Emergency Medicine Observation Re-evaluation Note  Jaime Flores is a 35 y.o. female, seen on rounds today.  Pt initially presented to the ED for complaints of Suicidal Currently, the patient is resting.  Physical Exam  BP 130/70 (BP Location: Right Arm)   Pulse 77   Temp 98.1 F (36.7 C) (Oral)   Resp 18   Ht 5\' 2"  (1.575 m)   Wt 77.1 kg   SpO2 100%   BMI 31.09 kg/m  Physical Exam General: resting comfortably, NAD Lungs: normal WOB Psych: currently calm and resting  ED Course / MDM  EKG:   I have reviewed the labs performed to date as well as medications administered while in observation.  Recent changes in the last 24 hours include none.  Plan  Current plan is for psychiatric reassessment today.  Jaime Flores is not under involuntary commitment.     Lorelle Gibbs, [place] 04/30/21 [id number]

## 2021-04-30 NOTE — ED Notes (Signed)
Pt given phone to make call.

## 2021-04-30 NOTE — ED Notes (Signed)
ED provider notified to renew orders for telesitter per [facility]

## 2021-04-30 NOTE — ED Notes (Signed)
TTS in progress and tele-sitter in place

## 2021-04-30 NOTE — ED Notes (Signed)
Pt wanded second time after changing into scrubs

## 2021-04-30 NOTE — ED Notes (Signed)
Pt being evaluated by telepsych at this time.

## 2021-04-30 NOTE — ED Notes (Signed)
PA advised to reorder telesitter orders at this time

## 2021-04-30 NOTE — ED Provider Notes (Signed)
[facility] EMERGENCY DEPARTMENT Provider Note   CSN: [id number] Arrival date & time: 04/29/21  2331     History Chief Complaint  Patient presents with   Suicidal    Jaime Flores is a 35 y.o. female.   Mental Health Problem Presenting symptoms: depression and suicidal thoughts   Patient accompanied by:  Family member Degree of incapacity (severity):  Moderate Onset quality:  Gradual Duration:  2 weeks Timing:  Constant Progression:  Worsening Chronicity:  New Context: stressful life event   Context: not alcohol use, not drug abuse, not medication, not noncompliant and not recent medication change   Treatment compliance:  All of the time Relieved by:  None tried Worsened by:  Nothing     Past Medical History:  Diagnosis Date   Amenorrhea 01/27/2013   Angio-edema    Breast pain 03/10/2015   Encounter for menstrual regulation 07/05/2015   History of pseudoseizure    Irregular intermenstrual bleeding 07/28/2015   Irregular periods 07/05/2015   Screening for STD (sexually transmitted disease) 08/07/2013   Urticaria    UTI (urinary tract infection)    Vaginal discharge 06/10/2013    Patient Active Problem List   Diagnosis Date Noted   Alteration consciousness 08/23/2020   Prediabetes 02/15/2020   Reactive hypoglycemia 02/15/2020   Anxiety 11/18/2019   Depression, recurrent ([place]) 11/18/2019   Encounter for gynecological examination with Papanicolaou smear of cervix 06/03/2019   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   Cough 05/15/2019   Seasonal and perennial allergic rhinitis 05/15/2019   Bug bite 02/06/2019   Hemorrhoids 09/26/2016   Irregular intermenstrual bleeding 07/28/2015   Amenorrhea 01/27/2013    Past Surgical History:  Procedure Laterality Date   APPENDECTOMY     CHOLECYSTECTOMY     TUBAL LIGATION       OB History     Gravida  2   Para  2   Term  1   Preterm  1   AB      Living  2      SAB      IAB       Ectopic      Multiple      Live Births              Family History  Problem Relation Age of Onset   Autism Son    Diabetes Maternal Grandmother    Stroke Maternal Grandmother    Heart failure Maternal Grandmother    Stroke Maternal Grandfather    Stroke Father    Seizures Father    Allergies Father        penicillin   Heart attack Father    Other Mother        herpes   Hyperlipidemia Mother    Heart failure Paternal Grandfather    Diabetes Paternal Grandmother    Hypertension Paternal Grandmother    Autism Brother    Other Paternal Uncle        heart exploded   Cancer Maternal Aunt    Diabetes Maternal Aunt    Hypertension Maternal Aunt    Immunodeficiency Neg Hx    Urticaria Neg Hx    Eczema Neg Hx    Atopy Neg Hx    Asthma Neg Hx    Angioedema Neg Hx    Allergic rhinitis Neg Hx     Social History   Tobacco Use   Smoking status: Every Day    Packs/day: 0.50  Types: Cigarettes   Smokeless tobacco: Never  Vaping Use   Vaping Use: Never used  Substance Use Topics   Alcohol use: No   Drug use: No    Home Medications Prior to Admission medications   Medication Sig Start Date End Date Taking? Authorizing Provider  brexpiprazole (REXULTI) 1 MG TABS tablet Take 1 tablet (1 mg total) by mouth daily. 03/01/21   [name], Fransisca Kaufmann, MD  citalopram (CELEXA) 40 MG tablet Take 1 tablet (40 mg total) by mouth daily. 12/07/20   [name], Fransisca Kaufmann, MD  EPINEPHrine 0.3 mg/0.3 mL IJ SOAJ injection Inject 0.3 mLs (0.3 mg total) into the muscle as needed for anaphylaxis. Patient not taking: No sig reported 11/13/19   Valentina Shaggy, MD  hydrOXYzine (VISTARIL) 25 MG capsule Take 1 capsule (25 mg total) by mouth every 8 (eight) hours as needed for anxiety. 03/01/21   [name], Fransisca Kaufmann, MD    Allergies    Catfish [fish allergy], Doxycycline, Wellbutrin [bupropion], and Tape  Review of Systems   Review of Systems  Psychiatric/Behavioral:  Positive for  suicidal ideas.   All other systems reviewed and are negative.  Physical Exam Updated Vital Signs BP 130/70 (BP Location: Right Arm)   Pulse 77   Temp 98.1 F (36.7 C) (Oral)   Resp 18   Ht 5\' 2"  (1.575 m)   Wt 77.1 kg   SpO2 100%   BMI 31.09 kg/m   Physical Exam Vitals and nursing note reviewed.  Constitutional:      Appearance: She is well-developed.  HENT:     Head: Normocephalic and atraumatic.  Eyes:     Pupils: Pupils are equal, round, and reactive to light.  Cardiovascular:     Rate and Rhythm: Normal rate and regular rhythm.  Pulmonary:     Effort: No respiratory distress.     Breath sounds: No stridor. No wheezing.  Abdominal:     General: Abdomen is flat. There is no distension.  Musculoskeletal:        General: No swelling or tenderness.     Cervical back: Normal range of motion.  Skin:    General: Skin is warm and dry.  Neurological:     General: No focal deficit present.     Mental Status: She is alert.    ED Results / Procedures / Treatments   Labs (all labs ordered are listed, but only abnormal results are displayed) Labs Reviewed  COMPREHENSIVE METABOLIC PANEL - Abnormal; Notable for the following components:      Result Value   Potassium 3.1 (*)    Calcium 8.8 (*)    All other components within normal limits  CBC WITH DIFFERENTIAL/PLATELET - Abnormal; Notable for the following components:   WBC 12.1 (*)    Neutro Abs 7.9 (*)    All other components within normal limits  RESP PANEL BY RT-PCR (FLU A&B, COVID) ARPGX2  ETHANOL  RAPID URINE DRUG SCREEN, HOSP PERFORMED  POC URINE PREG, ED    EKG None  Radiology No results found.  Procedures Procedures   Medications Ordered in ED Medications  hydrOXYzine (ATARAX/VISTARIL) tablet 25 mg (has no administration in time range)  citalopram (CELEXA) tablet 40 mg (40 mg Oral Given 04/30/21 0109)  brexpiprazole (REXULTI) tablet 1 mg (1 mg Oral Given 04/30/21 0110)  alum & mag hydroxide-simeth  (MAALOX/MYLANTA) 200-200-20 MG/5ML suspension 30 mL (has no administration in time range)  nicotine (NICODERM CQ - dosed in mg/24 hours) patch 21 mg (  has no administration in time range)  ondansetron (ZOFRAN) tablet 4 mg (has no administration in time range)  zolpidem (AMBIEN) tablet 5 mg (has no administration in time range)  acetaminophen (TYLENOL) tablet 650 mg (has no administration in time range)  potassium chloride SA (KLOR-CON) CR tablet 80 mEq (80 mEq Oral Given 04/30/21 0209)    ED Course  I have reviewed the triage vital signs and the nursing notes.  Pertinent labs & imaging results that were available during my care of the patient were reviewed by me and considered in my medical decision making (see chart for details).    MDM Rules/Calculators/A&P                         Worsening depression and suicidal thoughts. No plan. Will medically clear and consult TTS.   Slightly low potassium but I don't think this would preclude tts evaluation and further recommendations. Will replete. Medically clear for consultation.   Final Clinical Impression(s) / ED Diagnoses Final diagnoses:  None    Rx / DC Orders ED Discharge Orders     None        Amore Grater, Corene Cornea, MD 04/30/21 [phone]

## 2021-04-30 NOTE — Consult Note (Signed)
Telepsych Consultation   Reason for Consult:  psych consult Referring Physician:  Dorise Bullion, MD Location of Patient: [facility] [phone] Location of Provider: [facility]  Patient Identification: Jaime Flores MRN:  [id number] Principal Diagnosis: <principal problem not specified> Diagnosis:  Active Problems:   * No active hospital problems. *   Total Time spent with patient: 20 minutes  Subjective:   [name] is a 35 y.o. female patient with no psychiatric history admitted with suicidal ideations.  Patient presents alert and oriented. "I was having suicidal thoughts. States its been going on for about 2 weeks; says she believes it due to recent medication change. Rexulti 0.5mg  increased to 1 mg about 2.5 weeks ago. Endorses  poor sleep with frequent awakening periods, low energy, thoughts of hopelessness, increased appetite (result of Rexulti), with passive suicidal ideations; denies any anhedonia, concentration issues, or intent/plan to harm herself. She denies any active suicidal ideations and contracts for safety; provided verbal permission to contact mother for further discharge and safety planning.   Currently lives at home with parents in [place]. Denies any history of self harm, suicidal attempts, or any psychiatric hospitalization. Currently receives psychotropic medications via PCP; no current outpatient therapist or prescriber.   Collateral: [name] [phone] Mom states patient currently lives with her with her children where patient "does a good job" and recently had a bad break-up in which she feels in the primary factor in her changes. Denies any safety concerns does feel daughter would benefit from therapy and will make sure she gets there.   Past Psychiatric History: none noted  Risk to Self:  pt denies Risk to Others:  pt denies Prior Inpatient Therapy:  pt denies Prior Outpatient Therapy:  pt denies  Past Medical History:   Past Medical History:  Diagnosis Date   Amenorrhea 01/27/2013   Angio-edema    Breast pain 03/10/2015   Encounter for menstrual regulation 07/05/2015   History of pseudoseizure    Irregular intermenstrual bleeding 07/28/2015   Irregular periods 07/05/2015   Screening for STD (sexually transmitted disease) 08/07/2013   Urticaria    UTI (urinary tract infection)    Vaginal discharge 06/10/2013    Past Surgical History:  Procedure Laterality Date   APPENDECTOMY     CHOLECYSTECTOMY     TUBAL LIGATION     Family History:  Family History  Problem Relation Age of Onset   Autism Son    Diabetes Maternal Grandmother    Stroke Maternal Grandmother    Heart failure Maternal Grandmother    Stroke Maternal Grandfather    Stroke Father    Seizures Father    Allergies Father        penicillin   Heart attack Father    Other Mother        herpes   Hyperlipidemia Mother    Heart failure Paternal Grandfather    Diabetes Paternal Grandmother    Hypertension Paternal Grandmother    Autism Brother    Other Paternal Uncle        heart exploded   Cancer Maternal Aunt    Diabetes Maternal Aunt    Hypertension Maternal Aunt    Immunodeficiency Neg Hx    Urticaria Neg Hx    Eczema Neg Hx    Atopy Neg Hx    Asthma Neg Hx    Angioedema Neg Hx    Allergic rhinitis Neg Hx    Family Psychiatric  History: not noted Social History:  Social History   Substance and Sexual Activity  Alcohol Use No     Social History   Substance and Sexual Activity  Drug Use No    Social History   Socioeconomic History   Marital status: Flores    Spouse name: Not on file   Number of children: 2   Years of education: 12   Highest education level: High school graduate  Occupational History   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    Right-handed.   Caffeine use: 0.5 - 0.75 liters of soda per day, two cups Mocha Frappe on weekdays   Social Determinants of Health   Financial Resource Strain: Not on file  Food Insecurity: Not on file  Transportation Needs: Not on file  Physical Activity: Not on file  Stress: Not on file  Social Connections: Not on file   Additional Social History:    Allergies:   Allergies  Allergen Reactions   Catfish [Fish Allergy] Shortness Of Breath   Doxycycline Shortness Of Breath   Wellbutrin [Bupropion] Shortness Of Breath, Nausea Only and Palpitations   Tape Swelling    Plastic tape please use paper    Labs:  Results for orders placed or performed during the hospital encounter of 04/29/21 (from the past 48 hour(s))  Urine rapid drug screen (hosp performed)     Status: None   Collection Time: 04/30/21 12:41 AM  Result Value Ref Range   Opiates NONE DETECTED NONE DETECTED   Cocaine NONE DETECTED NONE DETECTED   Benzodiazepines NONE DETECTED NONE DETECTED   Amphetamines NONE DETECTED NONE DETECTED   Tetrahydrocannabinol NONE DETECTED NONE DETECTED   Barbiturates NONE DETECTED NONE DETECTED    Comment: (NOTE) DRUG SCREEN FOR MEDICAL PURPOSES ONLY.  IF CONFIRMATION IS NEEDED FOR ANY PURPOSE, NOTIFY LAB WITHIN 5 DAYS.  LOWEST DETECTABLE LIMITS FOR URINE DRUG SCREEN Drug Class                     Cutoff (ng/mL) Amphetamine and metabolites    1000 Barbiturate and metabolites    200 Benzodiazepine                 659 Tricyclics and metabolites     300 Opiates and metabolites        300 Cocaine and metabolites        300 THC                            50 Performed at [facility], [address] [place], [place] [address]   POC urine  preg, ED     Status: None   Collection Time: 04/30/21 12:42 AM  Result Value Ref Range   Preg Test, Ur NEGATIVE NEGATIVE    Comment:        THE SENSITIVITY OF THIS METHODOLOGY IS >24 mIU/mL   Comprehensive metabolic panel     Status: Abnormal   Collection Time: 04/30/21 12:47  AM  Result Value Ref Range   Sodium 138 135 - 145 mmol/L   Potassium 3.1 (L) 3.5 - 5.1 mmol/L   Chloride 102 98 - 111 mmol/L   CO2 29 22 - 32 mmol/L   Glucose, Bld 84 70 - 99 mg/dL    Comment: Glucose reference range applies only to samples taken after fasting for at least 8 hours.   BUN 8 6 - 20 mg/dL   Creatinine, Ser 0.88 0.44 - 1.00 mg/dL   Calcium 8.8 (L) 8.9 - 10.3 mg/dL   Total Protein 7.4 6.5 - 8.1 g/dL   Albumin 4.0 3.5 - 5.0 g/dL   AST 17 15 - 41 U/L   ALT 22 0 - 44 U/L   Alkaline Phosphatase 103 38 - 126 U/L   Total Bilirubin 0.6 0.3 - 1.2 mg/dL   GFR, Estimated >60 >60 mL/min    Comment: (NOTE) Calculated using the CKD-EPI Creatinine Equation (2021)    Anion gap 7 5 - 15    Comment: Performed at [facility], [address], [place], [place] [address]  CBC with Diff     Status: Abnormal   Collection Time: 04/30/21 12:47 AM  Result Value Ref Range   WBC 12.1 (H) 4.0 - 10.5 K/uL   RBC 4.40 3.87 - 5.11 MIL/uL   Hemoglobin 12.8 12.0 - 15.0 g/dL   HCT 40.9 36.0 - 46.0 %   MCV 93.0 80.0 - 100.0 fL   MCH 29.1 26.0 - 34.0 pg   MCHC 31.3 30.0 - 36.0 g/dL   RDW 15.3 11.5 - 15.5 %   Platelets 361 150 - 400 K/uL   nRBC 0.0 0.0 - 0.2 %   Neutrophils Relative % 65 %   Neutro Abs 7.9 (H) 1.7 - 7.7 K/uL   Lymphocytes Relative 28 %   Lymphs Abs 3.4 0.7 - 4.0 K/uL   Monocytes Relative 4 %   Monocytes Absolute 0.5 0.1 - 1.0 K/uL   Eosinophils Relative 2 %   Eosinophils Absolute 0.3 0.0 - 0.5 K/uL   Basophils Relative 1 %   Basophils Absolute 0.1 0.0 - 0.1 K/uL   Immature Granulocytes 0 %   Abs Immature Granulocytes 0.03 0.00 - 0.07 K/uL    Comment: Performed at [facility], [address]  [place], [place] [address]  Ethanol     Status: None   Collection Time: 04/30/21 12:53 AM  Result Value Ref Range   Alcohol, Ethyl (B) <10 <10 mg/dL    Comment: (NOTE) Lowest detectable limit for serum alcohol is 10 mg/dL.  For medical purposes only. Performed at [facility], [address] [place], [place] [address]     Medications:  Current Facility-Administered Medications  Medication Dose Route Frequency Provider Last Rate Last Admin   acetaminophen (TYLENOL) tablet 650 mg  650 mg Oral Q4H PRN Mesner, Corene Cornea, MD       alum & mag hydroxide-simeth (MAALOX/MYLANTA) 200-200-20 MG/5ML suspension 30 mL  30 mL Oral Q6H PRN Mesner, Corene Cornea, MD       brexpiprazole (REXULTI) tablet 1 mg  1 mg Oral QHS Mesner, Jason, MD   1 mg at 04/30/21 0110   citalopram (CELEXA) tablet 40 mg  40 mg Oral QHS Mesner, Jason, MD   40 mg at 04/30/21 0109   hydrOXYzine (ATARAX/VISTARIL) tablet 25 mg  25 mg Oral Q8H PRN Mesner, Corene Cornea, MD       nicotine (NICODERM CQ - dosed in mg/24 hours) patch 21 mg  21  mg Transdermal Daily PRN Mesner, Corene Cornea, MD       ondansetron [facility]) tablet 4 mg  4 mg Oral Q8H PRN Mesner, Corene Cornea, MD       potassium chloride SA (KLOR-CON) CR tablet 80 mEq  80 mEq Oral BID Mesner, Corene Cornea, MD   80 mEq at 04/30/21 1007   zolpidem (AMBIEN) tablet 5 mg  5 mg Oral QHS PRN Mesner, Corene Cornea, MD       Current Outpatient Medications  Medication Sig Dispense Refill   brexpiprazole (REXULTI) 1 MG TABS tablet Take 1 tablet (1 mg total) by mouth daily. 30 tablet 3   citalopram (CELEXA) 40 MG tablet Take 1 tablet (40 mg total) by mouth daily. 90 tablet 3   hydrOXYzine (VISTARIL) 25 MG capsule Take 1 capsule (25 mg total) by mouth every 8 (eight) hours as needed for anxiety. 30 capsule 2   EPINEPHrine 0.3 mg/0.3 mL IJ SOAJ injection Inject 0.3 mLs (0.3 mg total) into the muscle as needed for anaphylaxis. (Patient not taking: No sig reported) 2 each 2    Musculoskeletal: Strength & Muscle Tone: within normal  limits Gait & Station: normal Patient leans: N/A  Psychiatric Specialty Exam:  Presentation  General Appearance:  No data recorded Eye Contact: No data recorded Speech: No data recorded Speech Volume: No data recorded Handedness: No data recorded  Mood and Affect  Mood: No data recorded Affect: No data recorded  Thought Process  Thought Processes: No data recorded Descriptions of Associations:No data recorded Orientation:No data recorded Thought Content:No data recorded History of Schizophrenia/Schizoaffective disorder:No data recorded Duration of Psychotic Symptoms:No data recorded Hallucinations:No data recorded Ideas of Reference:No data recorded Suicidal Thoughts:No data recorded Homicidal Thoughts:No data recorded  Sensorium  Memory: No data recorded Judgment: No data recorded Insight: No data recorded  Executive Functions  Concentration: No data recorded Attention Span: No data recorded Recall: No data recorded Fund of Knowledge: No data recorded Language: No data recorded  Psychomotor Activity  Psychomotor Activity: No data recorded  Assets  Assets: No data recorded  Sleep  Sleep: No data recorded   Physical Exam: Physical Exam Vitals and nursing note reviewed.   ROS Blood pressure 109/68, pulse 62, temperature 97.6 F (36.4 C), temperature source Oral, resp. rate 16, height 5\' 2"  (1.575 m), weight 77.1 kg, SpO2 98 %. Body mass index is 31.09 kg/m.  Treatment Plan Summary: Plan Discharge patient home with plan to follow up with outpatient resources for therapy and your [facility] Provider to address medications, in the meantime STOP Brexpiprazole (REXULTI) due to increased suicidal ideations.    Disposition: No evidence of imminent risk to self or others at present.   Patient does not meet criteria for psychiatric inpatient admission. Supportive therapy provided about ongoing stressors. Discussed crisis plan, support from  social network, calling 911, coming to the Emergency Department, and calling Suicide Hotline. Patient contracts for safety. Lives with mother who denies any safety concerns at this time.   This service was provided via telemedicine using a 2-way, interactive audio and video technology.  Names of all persons participating in this telemedicine service and their role in this encounter. Name: Oneida Alar Role: PMHNP  Name: [place] Abbot Role: Attending MD  Name: Jaime Flores Role: patient  Name: [name] Role: mother    Inda Merlin, NP 04/30/2021 1:48 PM

## 2021-04-30 NOTE — ED Notes (Signed)
Pt visitor at bedside at this time.

## 2021-04-30 NOTE — Clinical Social Work Note (Signed)
CSW consulted for OP mental health services. CSW provided resource list to patient. Patient agreeable to review list.

## 2021-05-01 ENCOUNTER — Telehealth: Payer: Self-pay

## 2021-05-01 NOTE — Telephone Encounter (Signed)
Transition Care Management Unsuccessful Follow-up Telephone Call  Date of discharge and from where:  04/30/2021-[facility]   Attempts:  1st Attempt  Reason for unsuccessful TCM follow-up call:  Left voice message

## 2021-05-02 ENCOUNTER — Ambulatory Visit (INDEPENDENT_AMBULATORY_CARE_PROVIDER_SITE_OTHER): Payer: Medicaid Other | Admitting: Family Medicine

## 2021-05-02 ENCOUNTER — Encounter: Payer: Self-pay | Admitting: Family Medicine

## 2021-05-02 ENCOUNTER — Other Ambulatory Visit: Payer: Self-pay

## 2021-05-02 VITALS — BP 107/68 | HR 73 | Ht 62.0 in | Wt 163.0 lb

## 2021-05-02 DIAGNOSIS — F339 Major depressive disorder, recurrent, unspecified: Secondary | ICD-10-CM

## 2021-05-02 DIAGNOSIS — F419 Anxiety disorder, unspecified: Secondary | ICD-10-CM | POA: Diagnosis not present

## 2021-05-02 MED ORDER — CARIPRAZINE HCL 1.5 MG PO CAPS: 1.5000 mg | ORAL_CAPSULE | Freq: Every day | ORAL | 1 refills | Status: DC

## 2021-05-02 NOTE — Telephone Encounter (Signed)
Transition Care Management Unsuccessful Follow-up Telephone Call  Date of discharge and from where:  04/30/2021 from [facility]  Attempts:  2nd Attempt  Reason for unsuccessful TCM follow-up call:  Left voice message

## 2021-05-02 NOTE — Progress Notes (Signed)
BP 107/68   Pulse 73   Ht 5\' 2"  (1.575 m)   Wt 163 lb (73.9 kg)   SpO2 97%   BMI 29.81 kg/m    Subjective:   Patient ID: Jaime Flores, female    DOB: 23-Jul-1986, 35 y.o.   MRN: [id number]  HPI: Jaime Flores is a 35 y.o. female presenting on 05/02/2021 for [place] Follow up Jaime Flores to ER on Sunday. Having suicidal thoughts. Physician in hospital concerned that Jaime Flores may be causing. Pt believes the hospital put in a referral to psych.)   HPI Anxiety depression recheck Patient is coming in today for ER follow-up for depression and anxiety.  She was in the ER 2 days ago with suicidal ideations and they were thinking it was due to the increase of the medicine and the [place].  We increased the medicine because she was still having pseudoseizures a lot of anxiety significant.  She still takes escitalopram.  She says she still has some suicidal ideations but no actual plans or thoughts of plans.  She has a lot of motivation to live because of her 2 children who she is the sole caretaker.  Relevant past medical, surgical, family and social history reviewed and updated as indicated. Interim medical history since our last visit reviewed. Allergies and medications reviewed and updated.  Review of Systems  Constitutional:  Negative for chills and fever.  Eyes:  Negative for visual disturbance.  Respiratory:  Negative for chest tightness and shortness of breath.   Cardiovascular:  Negative for chest pain and leg swelling.  Musculoskeletal:  Negative for back pain and gait problem.  Skin:  Negative for rash.  Neurological:  Negative for light-headedness and headaches.  Psychiatric/Behavioral:  Positive for dysphoric mood. Negative for agitation, behavioral problems, self-injury, sleep disturbance and suicidal ideas. The patient is nervous/anxious.   All other systems reviewed and are negative.  Per HPI unless specifically indicated above   Allergies as of 05/02/2021        Reactions   Catfish [fish Allergy] Shortness Of Breath   Doxycycline Shortness Of Breath   Wellbutrin [bupropion] Shortness Of Breath, Nausea Only, Palpitations   Tape Swelling   Plastic tape please use paper        Medication List        Accurate as of May 02, 2021  2:18 PM. If you have any questions, ask your nurse or doctor.          STOP taking these medications    brexpiprazole 1 MG Tabs tablet Commonly known as: Rexulti Stopped by: Fransisca Kaufmann Yezenia Fredrick, MD   EPINEPHrine 0.3 mg/0.3 mL Soaj injection Commonly known as: EPI-PEN Stopped by: Fransisca Kaufmann Kenyana Husak, MD   hydrOXYzine 25 MG capsule Commonly known as: VISTARIL Stopped by: [name], MD       TAKE these medications    cariprazine 1.5 MG capsule Commonly known as: Vraylar Take 1 capsule (1.5 mg total) by mouth daily. Started by: [name], MD   citalopram 40 MG tablet Commonly known as: CELEXA Take 1 tablet (40 mg total) by mouth daily.   traZODone 50 MG tablet Commonly known as: DESYREL Take 50 mg by mouth as needed for sleep.         Objective:   BP 107/68   Pulse 73   Ht 5\' 2"  (4.944 m)   Wt 163 lb (73.9 kg)   SpO2 97%   BMI 29.81 kg/m   Wt Readings from  Last 3 Encounters:  05/02/21 163 lb (73.9 kg)  04/29/21 170 lb (77.1 kg)  02/26/21 156 lb (70.8 kg)    Physical Exam Vitals and nursing note reviewed.  Constitutional:      General: She is not in acute distress.    Appearance: She is well-developed. She is not diaphoretic.  Eyes:     Conjunctiva/sclera: Conjunctivae normal.  Musculoskeletal:        General: No tenderness. Normal range of motion.  Skin:    General: Skin is warm and dry.     Findings: No rash.  Neurological:     Mental Status: She is alert and oriented to person, place, and time.     Coordination: Coordination normal.  Psychiatric:        Mood and Affect: Mood is anxious and depressed.        Behavior: Behavior normal.         Thought Content: Thought content includes suicidal ideation. Thought content does not include suicidal plan.       Assessment & Plan:   Problem List Items Addressed This Visit       Other   Anxiety   Relevant Medications   traZODone (DESYREL) 50 MG tablet   cariprazine (VRAYLAR) 1.5 MG capsule   Depression, recurrent (HCC) - Primary   Relevant Medications   traZODone (DESYREL) 50 MG tablet   cariprazine (VRAYLAR) 1.5 MG capsule    Continue Celexa and trazodone, we will try and add Vraylar Follow up plan: Return in about 2 weeks (around 05/16/2021), or if symptoms worsen or fail to improve, for Depression and anxiety.  Counseling provided for all of the vaccine components No orders of the defined types were placed in this encounter.   Caryl Pina, MD [facility] 05/02/2021, 2:18 PM

## 2021-05-03 NOTE — Telephone Encounter (Signed)
Transition Care Management Unsuccessful Follow-up Telephone Call  Date of discharge and from where:  04/30/2021 from [facility]  Attempts:  3rd Attempt  Reason for unsuccessful TCM follow-up call:  Unable to reach patient

## 2021-05-04 ENCOUNTER — Telehealth: Payer: Self-pay | Admitting: Family Medicine

## 2021-05-04 NOTE — Telephone Encounter (Signed)
error 

## 2021-05-04 NOTE — Telephone Encounter (Signed)
Pt wants to speak to [facility] and refuses to give any information. Please call back.

## 2021-05-04 NOTE — Telephone Encounter (Signed)
Having a bad day. Weaning off of Rexulti   Does not feel comfortable going to work. Feels like she may be too irritable and does not want to get fired.  Pt denies suicidal thoughts. Her sister is with her. Pt aware to go to hospital if she starts having suicidal thoughts.  Spoke with [name]. He wants pt to start Divide tomorrow 10/14. Agrees to keep pt out of work until next Friday. Letter typed and placed up front for pt to pick up today.

## 2021-05-05 ENCOUNTER — Telehealth: Payer: Self-pay | Admitting: Family Medicine

## 2021-05-05 NOTE — Telephone Encounter (Signed)
PA Case: [id number], Status: Approved, Coverage Starts on: 05/05/2021 12:00:00 AM, Coverage Ends on: 05/05/2022 12:00:00 AM.  Pt made aware to contact pharmacy this afternoon or Saturday for prescription.  Pharmacy made aware of approval.

## 2021-05-05 NOTE — Telephone Encounter (Signed)
(  Key: [id number])  This request has received a Favorable outcome.  Please note any additional information provided by [employer] [facility] at the bottom of this request.  Pt is aware that Arman Filter requires a PA and that it may be next week before we get this approved.  Pt wanted to know what Dr. Unknown Jim wants her to do in the meantime since he wanted her to start this medication ASAP.  Informed pt that he will not return until Monday and she is fine to wait until he can address.  She is weaning off of Kin[place] currently. She has tried and failed this med along with Vistaril, Celexa.  Pt advised to call or go somewhere for help if she begins to have suicidal thoughts. She agreed and understood. She seemed to be in better spirits today compared to yesterdays conversation. She does not have a plan or having suicidal thoughts currently. Will route to covering provider for reassurance that this can wait until [name] returns on Monday.

## 2021-05-11 ENCOUNTER — Encounter: Payer: Self-pay | Admitting: Family Medicine

## 2021-05-11 ENCOUNTER — Telehealth: Payer: Self-pay | Admitting: Family Medicine

## 2021-05-11 NOTE — Telephone Encounter (Signed)
Letter has been printed and placed up front for pt. Left message for pt informing of this and that she could also print from [facility] if needed.

## 2021-05-16 ENCOUNTER — Encounter: Payer: Self-pay | Admitting: Family Medicine

## 2021-05-17 ENCOUNTER — Ambulatory Visit: Payer: Medicaid Other | Admitting: Family Medicine

## 2021-05-19 ENCOUNTER — Encounter: Payer: Self-pay | Admitting: Family Medicine

## 2021-05-26 IMAGING — MR MR HEAD WO/W CM
17 of 19 series · 40 of 48 positions shown · IV contrast (7 ml Gadavist)
Comparison: CT head 08/04/2020

CLINICAL DATA: Mental status change.  History seizure.

EXAM:
MRI HEAD WITHOUT AND WITH CONTRAST
TECHNIQUE: Multiplanar, multiecho pulse sequences of the brain and surrounding
structures were obtained without and with intravenous contrast.
CONTRAST:  7mL GADAVIST GADOBUTROL 1 MMOL/ML IV SOLN

[Series 5: DWI · axial · 4.0mm · 0.88mm/px · z∈[-95,+41]mm · 3 of 36 slices shown (1 of 6)]
[im 1/36]
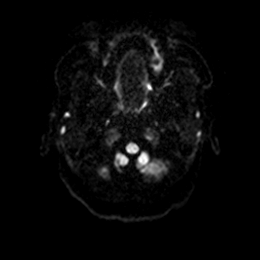
[im 18/36]
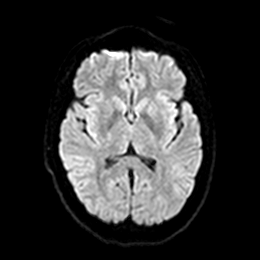
[im 36/36]
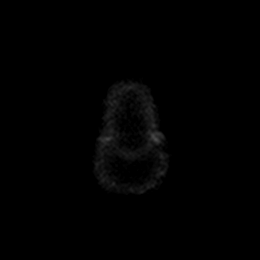

[Series 5: DWI · axial · 4.0mm · 0.88mm/px · z∈[-95,+41]mm · 3 of 36 slices shown (2 of 6)]
[im 1/36]
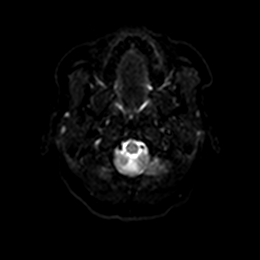
[im 18/36]
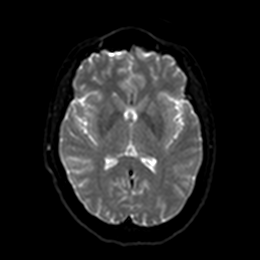
[im 36/36]
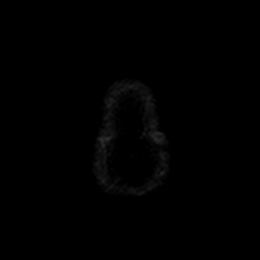

[Series 6: DWI · axial · 4.0mm · 0.88mm/px · z∈[-95,+41]mm · 3 of 36 slices shown (3 of 6)]
[im 1/36]
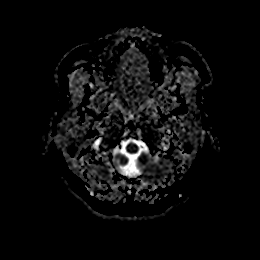
[im 18/36]
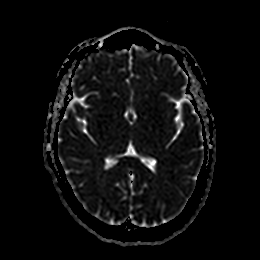
[im 36/36]
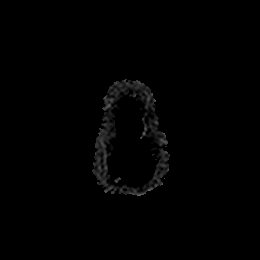

[Series 7: DWI · coronal · 5.0mm · 0.88mm/px · 2 of 28 slices shown (4 of 6)]
[im 1/28]
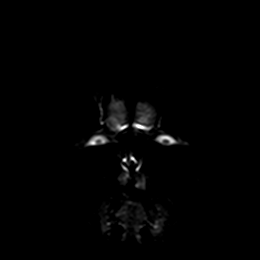
[im 28/28]
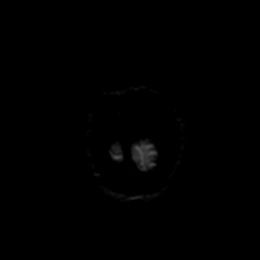

[Series 7: DWI · coronal · 5.0mm · 0.88mm/px · 2 of 28 slices shown (5 of 6)]
[im 1/28]
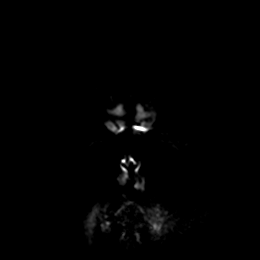
[im 28/28]
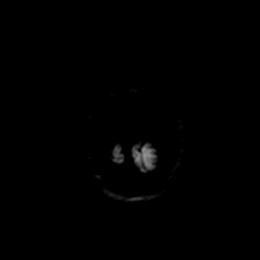

[Series 8: DWI · coronal · 5.0mm · 0.88mm/px · 2 of 28 slices shown (6 of 6)]
[im 1/28]
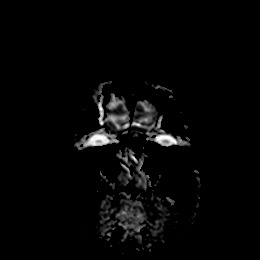
[im 28/28]
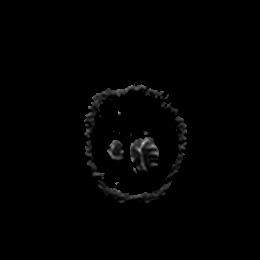

[Series 9: T1 · sagittal · 5.0mm · 0.75mm/px · 1 of 19 slices shown]
[im 1/19]
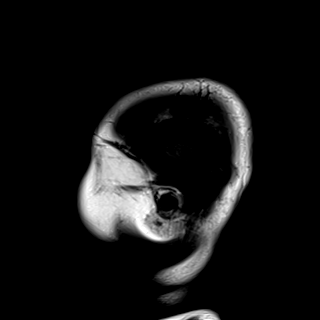

[Series 10: T2 · axial · 5.0mm · 0.72mm/px · 1 of 20 slices shown (1 of 2)]
[im 1/20]
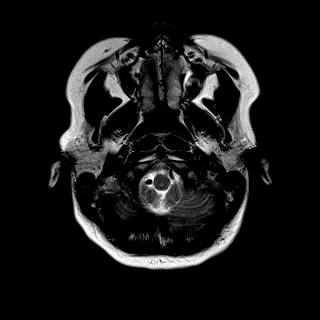

[Series 11: mag_images · axial · 3.0mm · 0.90mm/px · z∈[-106,+68]mm · 4 of 60 slices shown]
[im 1/60]
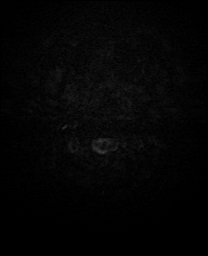
[im 20/60]
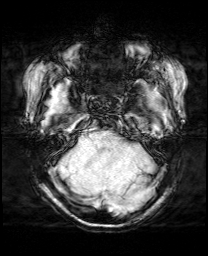
[im 40/60]
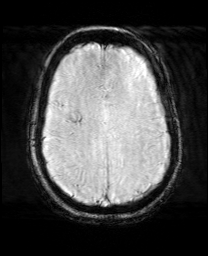
[im 60/60]
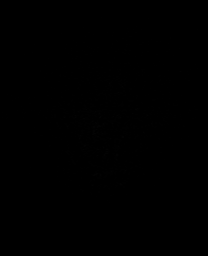

[Series 12: pha_images · axial · 3.0mm · 0.90mm/px · z∈[-106,+51]mm · 4 of 53 slices shown]
[im 1/53]
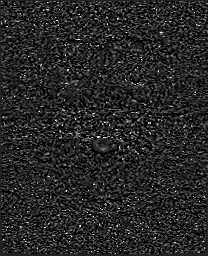
[im 18/53]
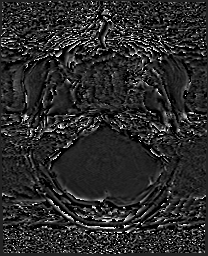
[im 35/53]
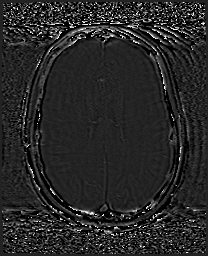
[im 53/53]
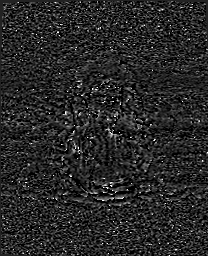

[Series 13: swi_images · axial · 3.0mm · 0.90mm/px · z∈[-106,+68]mm · 4 of 60 slices shown]
[im 1/60]
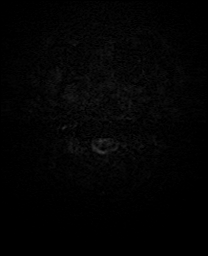
[im 20/60]
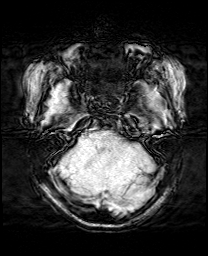
[im 40/60]
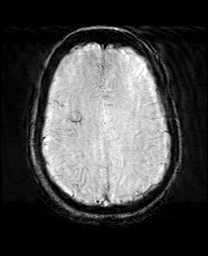
[im 60/60]
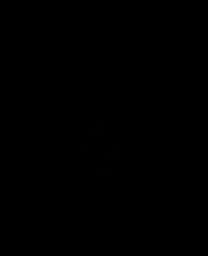

[Series 15: FLAIR · axial · 3.0mm · 0.45mm/px · z∈[-76,+39]mm · 3 of 40 slices shown (1 of 2)]
[im 1/40]
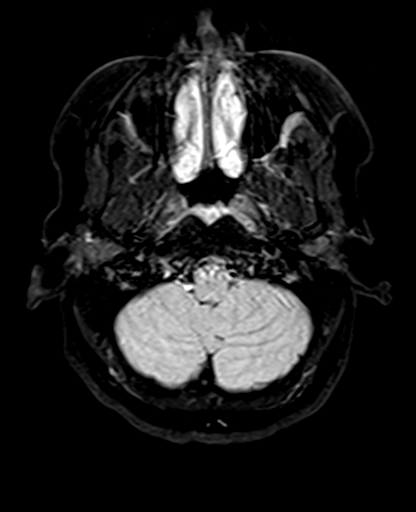
[im 20/40]
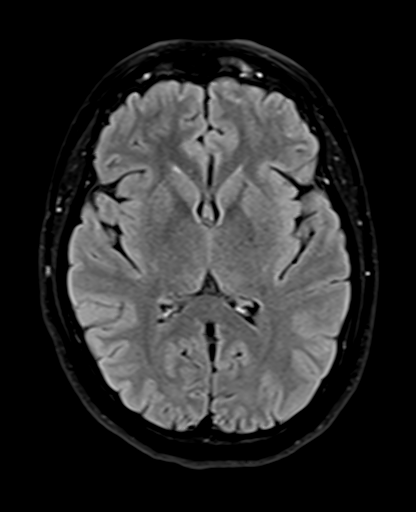
[im 40/40]
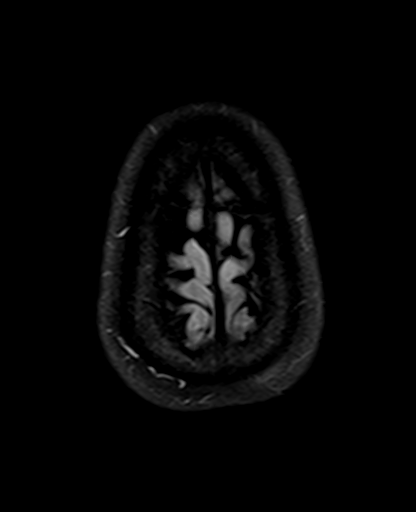

[Series 17: T2 post-contrast · coronal · 5.0mm · 0.72mm/px · 2 of 28 slices shown]
[im 1/28]
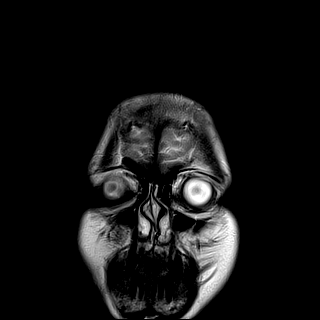
[im 28/28]
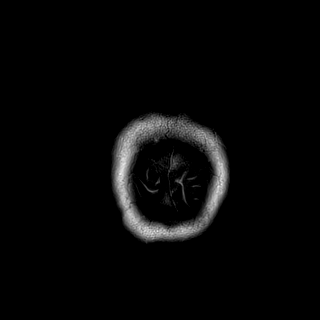

[Series 19: T1 post-contrast · coronal · 5.0mm · 0.34mm/px · 2 of 28 slices shown (1 of 2)]
[im 1/28]
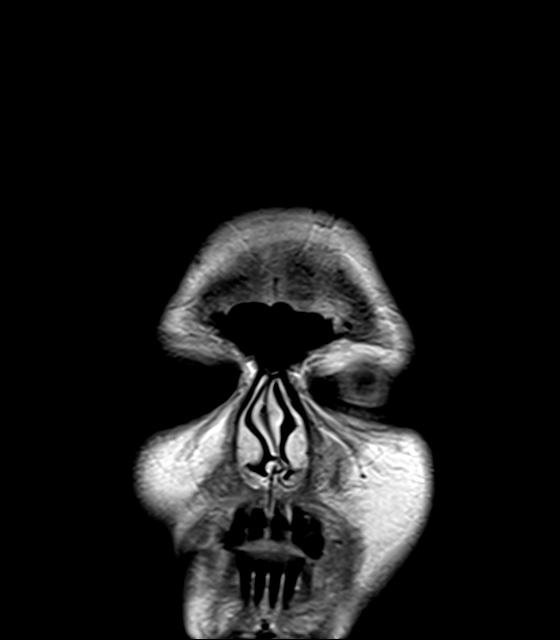
[im 28/28]
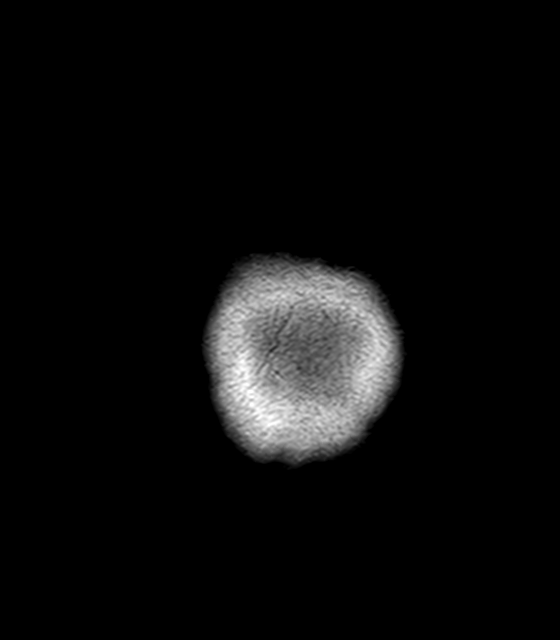

[Series 20: T1 post-contrast · sagittal · 5.0mm · 0.72mm/px · 1 of 19 slices shown (2 of 2)]
[im 1/19]
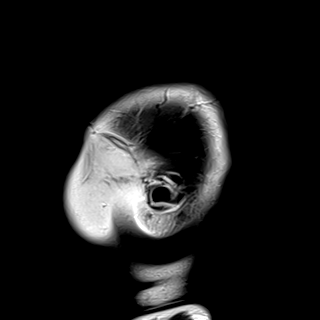

[Series 21: FLAIR · coronal · 3.0mm · 0.56mm/px · 1 of 21 slices shown (2 of 2)]
[im 1/21]
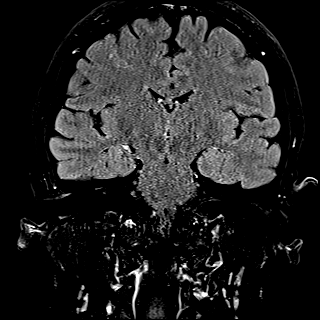

[Series 22: T2 · coronal · 3.0mm · 0.27mm/px · 2 of 32 slices shown (2 of 2)]
[im 1/32]
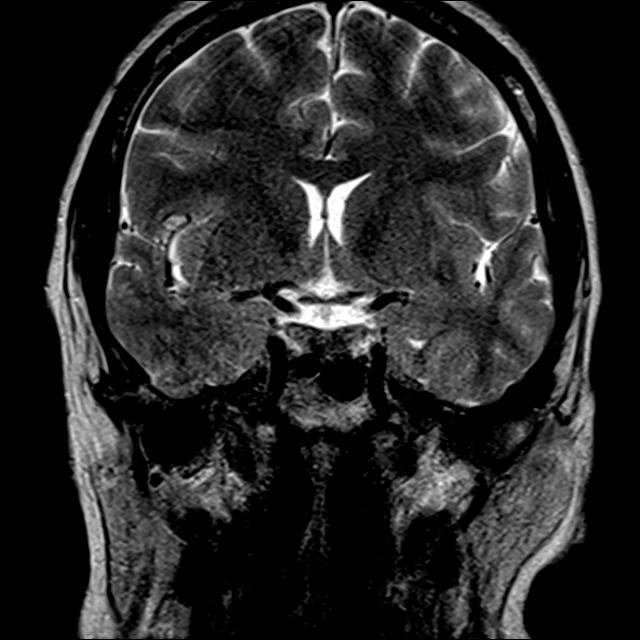
[im 32/32]
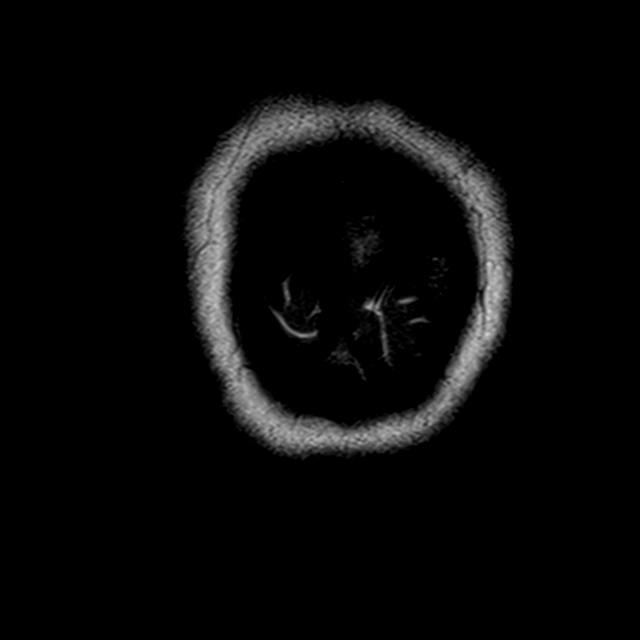

[40 of 48 positions shown; findings below may reference images not displayed]

FINDINGS: Brain: No acute infarction, hemorrhage, hydrocephalus, extra-axial
collection or mass lesion. Normal cerebral white matter

Following contrast infusion, there is a small transcortical
enhancing vessel in the right frontal lobe compatible with
developmental venous anomaly.

Vascular: Normal arterial flow voids.

Skull and upper cervical spine: Negative

Sinuses/Orbits: Mild mucosal edema paranasal sinuses. Negative orbit

Other: None
IMPRESSION: Small developmental venous anomaly right frontal lobe otherwise
normal study

## 2021-05-31 ENCOUNTER — Emergency Department (HOSPITAL_COMMUNITY)
Admission: EM | Admit: 2021-05-31 | Discharge: 2021-06-01 | Disposition: A | Discharge status: Home / Self Care | Payer: Medicaid Other | Attending: Emergency Medicine | Admitting: Emergency Medicine

## 2021-05-31 ENCOUNTER — Other Ambulatory Visit: Payer: Self-pay

## 2021-05-31 ENCOUNTER — Encounter (HOSPITAL_COMMUNITY): Payer: Self-pay | Admitting: *Deleted

## 2021-05-31 DIAGNOSIS — S60812A Abrasion of left wrist, initial encounter: Secondary | ICD-10-CM | POA: Diagnosis not present

## 2021-05-31 DIAGNOSIS — Z1339 Encounter for screening examination for other mental health and behavioral disorders: Secondary | ICD-10-CM | POA: Insufficient documentation

## 2021-05-31 DIAGNOSIS — F17210 Nicotine dependence, cigarettes, uncomplicated: Secondary | ICD-10-CM | POA: Diagnosis not present

## 2021-05-31 DIAGNOSIS — F419 Anxiety disorder, unspecified: Secondary | ICD-10-CM | POA: Diagnosis not present

## 2021-05-31 DIAGNOSIS — F332 Major depressive disorder, recurrent severe without psychotic features: Secondary | ICD-10-CM | POA: Diagnosis not present

## 2021-05-31 DIAGNOSIS — X58XXXA Exposure to other specified factors, initial encounter: Secondary | ICD-10-CM | POA: Insufficient documentation

## 2021-05-31 DIAGNOSIS — R45851 Suicidal ideations: Secondary | ICD-10-CM | POA: Insufficient documentation

## 2021-05-31 DIAGNOSIS — S6992XA Unspecified injury of left wrist, hand and finger(s), initial encounter: Secondary | ICD-10-CM | POA: Diagnosis present

## 2021-05-31 LAB — BASIC METABOLIC PANEL
Anion gap: 7 (ref 5–15)
BUN: 5 mg/dL — ABNORMAL LOW (ref 6–20)
CO2: 29 mmol/L (ref 22–32)
Calcium: 9 mg/dL (ref 8.9–10.3)
Chloride: 102 mmol/L (ref 98–111)
Creatinine, Ser: 0.71 mg/dL (ref 0.44–1.00)
GFR, Estimated: >60 mL/min (ref >60)
Glucose, Bld: 100 mg/dL — ABNORMAL HIGH (ref 70–99)
Potassium: 3.9 mmol/L (ref 3.5–5.1)
Sodium: 138 mmol/L (ref 135–145)

## 2021-05-31 LAB — CBC WITH DIFFERENTIAL/PLATELET
Abs Immature Granulocytes: 0.04 10*3/uL (ref 0.00–0.07)
Basophils Absolute: 0.1 10*3/uL (ref 0.0–0.1)
Basophils Relative: 0 %
Eosinophils Absolute: 0.2 10*3/uL (ref 0.0–0.5)
Eosinophils Relative: 2 %
HCT: 40.3 % (ref 36.0–46.0)
Hemoglobin: 12.8 g/dL (ref 12.0–15.0)
Immature Granulocytes: 0 %
Lymphocytes Relative: 25 %
Lymphs Abs: 2.9 10*3/uL (ref 0.7–4.0)
MCH: 29.3 pg (ref 26.0–34.0)
MCHC: 31.8 g/dL (ref 30.0–36.0)
MCV: 92.2 fL (ref 80.0–100.0)
Monocytes Absolute: 0.4 10*3/uL (ref 0.1–1.0)
Monocytes Relative: 4 %
Neutro Abs: 8.3 10*3/uL — ABNORMAL HIGH (ref 1.7–7.7)
Neutrophils Relative %: 69 %
Platelets: 358 10*3/uL (ref 150–400)
RBC: 4.37 MIL/uL (ref 3.87–5.11)
RDW: 15.1 % (ref 11.5–15.5)
WBC: 12 10*3/uL — ABNORMAL HIGH (ref 4.0–10.5)
nRBC: 0 % (ref 0.0–0.2)

## 2021-05-31 LAB — ETHANOL: Alcohol, Ethyl (B): <10 mg/dL (ref <10)

## 2021-05-31 MED ORDER — TRAZODONE HCL 50 MG PO TABS: 50.0000 mg | ORAL_TABLET | Freq: Every evening | ORAL | Status: DC | PRN

## 2021-05-31 MED ORDER — CITALOPRAM HYDROBROMIDE 20 MG PO TABS
40.0000 mg | ORAL_TABLET | Freq: Every day | ORAL | Status: DC
  Administered 2021-06-01: 40 mg via ORAL
  Filled 2021-05-31: qty 2

## 2021-05-31 MED ORDER — CARIPRAZINE HCL 1.5 MG PO CAPS
1.5000 mg | ORAL_CAPSULE | Freq: Every day | ORAL | Status: DC
  Administered 2021-06-01: 1.5 mg via ORAL
  Filled 2021-05-31: qty 1

## 2021-05-31 NOTE — ED Triage Notes (Signed)
Pt with SI for a month.  Tried to cut herself tonight.  Pt states a recent medication change. Denies ETOH or drug use.

## 2021-05-31 NOTE — ED Provider Notes (Signed)
[facility] EMERGENCY DEPARTMENT Provider Note   CSN: [id number] Arrival date & time: 05/31/21  2047     History Chief Complaint  Patient presents with   D22.0    Jaime Flores is a 35 y.o. female.  Patient with hx depression, c/o increasing feelings of depression and recurrent thoughts of self harm/suicidal ideation in the past month. Symptoms gradual onset, moderate, waxing and waning. Denies a new or specific stressor or inciting event. Indicates lives w family. States compliant w meds, but does not feel they are helping. Denies any physical illness or acute physical health problems. No headaches. No cp or sob. No cough or uri symptoms. No abd pain or nvd. Notes generally poor appetite. No wt change. Denies problems w etoh or substance use. Denies any overdose or overuse of meds. States did think about cutting wrist tonight, and has superficial abrasion to left wrist. Tetanus up to date.   The history is provided by the patient and medical records.      Past Medical History:  Diagnosis Date   Amenorrhea 01/27/2013   Angio-edema    Breast pain 03/10/2015   Encounter for menstrual regulation 07/05/2015   History of pseudoseizure    Irregular intermenstrual bleeding 07/28/2015   Irregular periods 07/05/2015   Screening for STD (sexually transmitted disease) 08/07/2013   Urticaria    UTI (urinary tract infection)    Vaginal discharge 06/10/2013    Patient Active Problem List   Diagnosis Date Noted   Prediabetes 02/15/2020   Anxiety 11/18/2019   Depression, recurrent ([place]) 11/18/2019   Seasonal and perennial allergic rhinitis 05/15/2019   Amenorrhea 01/27/2013    Past Surgical History:  Procedure Laterality Date   APPENDECTOMY     CHOLECYSTECTOMY     TUBAL LIGATION       OB History     Gravida  2   Para  2   Term  1   Preterm  1   AB      Living  2      SAB      IAB      Ectopic      Multiple      Live Births              Family  History  Problem Relation Age of Onset   Autism Son    Diabetes Maternal Grandmother    Stroke Maternal Grandmother    Heart failure Maternal Grandmother    Stroke Maternal Grandfather    Stroke Father    Seizures Father    Allergies Father        penicillin   Heart attack Father    Other Mother        herpes   Hyperlipidemia Mother    Heart failure Paternal Grandfather    Diabetes Paternal Grandmother    Hypertension Paternal Grandmother    Autism Brother    Other Paternal Uncle        heart exploded   Cancer Maternal Aunt    Diabetes Maternal Aunt    Hypertension Maternal Aunt    Immunodeficiency Neg Hx    Urticaria Neg Hx    Eczema Neg Hx    Atopy Neg Hx    Asthma Neg Hx    Angioedema Neg Hx    Allergic rhinitis Neg Hx     Social History   Tobacco Use   Smoking status: Every Day    Packs/day: 0.50    Types: Cigarettes  Smokeless tobacco: Never  Vaping Use   Vaping Use: Never used  Substance Use Topics   Alcohol use: No   Drug use: No    Home Medications Prior to Admission medications   Medication Sig Start Date End Date Taking? Authorizing Provider  cariprazine (VRAYLAR) 1.5 MG capsule Take 1 capsule (1.5 mg total) by mouth daily. 05/02/21   [name], Fransisca Kaufmann, MD  citalopram (CELEXA) 40 MG tablet Take 1 tablet (40 mg total) by mouth daily. 12/07/20   [name], Fransisca Kaufmann, MD  traZODone (DESYREL) 50 MG tablet Take 50 mg by mouth as needed for sleep.    [provider]    Allergies    Catfish [fish allergy], Doxycycline, Wellbutrin [bupropion], and Tape  Review of Systems   Review of Systems  Constitutional:  Negative for fever.  HENT:  Negative for sore throat.   Eyes:  Negative for redness.  Respiratory:  Negative for cough and shortness of breath.   Cardiovascular:  Negative for chest pain.  Gastrointestinal:  Negative for abdominal pain, diarrhea and vomiting.  Genitourinary:  Negative for flank pain, vaginal bleeding and vaginal  discharge.  Musculoskeletal:  Negative for back pain and neck pain.  Skin:  Negative for rash.  Neurological:  Negative for headaches.  Hematological:  Does not bruise/bleed easily.  Psychiatric/Behavioral:  Positive for dysphoric mood and suicidal ideas.    Physical Exam Updated Vital Signs BP 114/68 (BP Location: Right Arm)   Pulse 71   Temp 98.2 F (36.8 C) (Oral)   Resp 12   Ht 1.575 m (5\' 2" )   Wt 73.9 kg   SpO2 99%   BMI 29.81 kg/m   Physical Exam Vitals and nursing note reviewed.  Constitutional:      Appearance: Normal appearance. She is well-developed.  HENT:     Head: Atraumatic.     Nose: Nose normal.     Mouth/Throat:     Mouth: Mucous membranes are moist.  Eyes:     General: No scleral icterus.    Conjunctiva/sclera: Conjunctivae normal.     Pupils: Pupils are equal, round, and reactive to light.  Neck:     Trachea: No tracheal deviation.  Cardiovascular:     Rate and Rhythm: Normal rate and regular rhythm.     Pulses: Normal pulses.     Heart sounds: Normal heart sounds. No murmur heard.   No friction rub. No gallop.  Pulmonary:     Effort: Pulmonary effort is normal. No respiratory distress.     Breath sounds: Normal breath sounds.  Abdominal:     General: Bowel sounds are normal. There is no distension.     Palpations: Abdomen is soft.     Tenderness: There is no abdominal tenderness.  Genitourinary:    Comments: No cva tenderness.  Musculoskeletal:        General: No swelling or tenderness.     Cervical back: Normal range of motion and neck supple. No rigidity. No muscular tenderness.     Comments: Superficial abrasion left wrist, not through skin.   Skin:    General: Skin is warm and dry.     Findings: No rash.  Neurological:     Mental Status: She is alert.     Comments: Alert, speech normal. Motor/sens grossly intact bil. Steady gait.   Psychiatric:     Comments: Depressed mood, flat affect. +SI.     ED Results / Procedures /  Treatments   Labs (all labs ordered are  listed, but only abnormal results are displayed) Results for orders placed or performed during the hospital encounter of 05/31/21  Ethanol  Result Value Ref Range   Alcohol, Ethyl (B) <10 <10 mg/dL  CBC with Differential  Result Value Ref Range   WBC 12.0 (H) 4.0 - 10.5 K/uL   RBC 4.37 3.87 - 5.11 MIL/uL   Hemoglobin 12.8 12.0 - 15.0 g/dL   HCT 40.3 36.0 - 46.0 %   MCV 92.2 80.0 - 100.0 fL   MCH 29.3 26.0 - 34.0 pg   MCHC 31.8 30.0 - 36.0 g/dL   RDW 15.1 11.5 - 15.5 %   Platelets 358 150 - 400 K/uL   nRBC 0.0 0.0 - 0.2 %   Neutrophils Relative % 69 %   Neutro Abs 8.3 (H) 1.7 - 7.7 K/uL   Lymphocytes Relative 25 %   Lymphs Abs 2.9 0.7 - 4.0 K/uL   Monocytes Relative 4 %   Monocytes Absolute 0.4 0.1 - 1.0 K/uL   Eosinophils Relative 2 %   Eosinophils Absolute 0.2 0.0 - 0.5 K/uL   Basophils Relative 0 %   Basophils Absolute 0.1 0.0 - 0.1 K/uL   Immature Granulocytes 0 %   Abs Immature Granulocytes 0.04 0.00 - 0.07 K/uL  Basic metabolic panel  Result Value Ref Range   Sodium 138 135 - 145 mmol/L   Potassium 3.9 3.5 - 5.1 mmol/L   Chloride 102 98 - 111 mmol/L   CO2 29 22 - 32 mmol/L   Glucose, Bld 100 (H) 70 - 99 mg/dL   BUN 5 (L) 6 - 20 mg/dL   Creatinine, Ser 0.71 0.44 - 1.00 mg/dL   Calcium 9.0 8.9 - 10.3 mg/dL   GFR, Estimated >60 >60 mL/min   Anion gap 7 5 - 15      EKG None  Radiology No results found.  Procedures Procedures   Medications Ordered in ED Medications - No data to display  ED Course  I have reviewed the triage vital signs and the nursing notes.  Pertinent labs & imaging results that were available during my care of the patient were reviewed by me and considered in my medical decision making (see chart for details).    MDM Rules/Calculators/A&P                          Labs sent.   Reviewed nursing notes and prior charts for additional history.   Labs reviewed/interpreted by me - chem normal.    [place] team consulted.   The patient has been placed in psychiatric observation due to the need to provide a safe environment for the patient while obtaining psychiatric consultation and evaluation, as well as ongoing medical and medication management to treat the patient's condition.    Disposition per [facility] team.      Final Clinical Impression(s) / ED Diagnoses Final diagnoses:  None    Rx / DC Orders ED Discharge Orders     None        Lajean Saver, MD 05/31/21 2254

## 2021-05-31 NOTE — ED Notes (Signed)
Security wanded pt in triage.

## 2021-06-01 LAB — RAPID URINE DRUG SCREEN, HOSP PERFORMED
Amphetamines: NOT DETECTED
Barbiturates: NOT DETECTED
Benzodiazepines: NOT DETECTED
Cocaine: NOT DETECTED
Opiates: NOT DETECTED
Tetrahydrocannabinol: NOT DETECTED

## 2021-06-01 LAB — PREGNANCY, URINE: Preg Test, Ur: NEGATIVE

## 2021-06-01 NOTE — Discharge Instructions (Signed)
Please return immediately for severe worsening symptoms.  I would like for you to follow-up with a psychiatrist, please see the list below  Should be seen within the next 3 days, you may go to [facility] if you cannot make an appointment with anyone else  Outpatient Psychiatry and Counseling  [facility]: Mobile Crisis Management 24 hours:  [phone]  [facility] of the [employer] sliding scale fee and walk in schedule: M-F 8am-12pm/1pm-3pm [place], [place] [address] [place], [place] [address] [phone]  [facility] (Formerly known as [facility] [employer])- new patient walk-in appointments available Monday - Friday 8am -3pm.          [address] [place], [place] [address] [phone] or crisis line- [facility]/ Intensive Outpatient Therapy Program [place], [place] [address] [place]      [phone] [address] [place], [place] [address]                 [place]   [facility] [phone]. [place], [place] [address]   [employer] of Care          [address] Johnette Abraham  [place], [place] [address]       [phone]  [place], [place], [place] [address] [phone]  [facility]    [address] [place], [place] [address]     [place], [place] Joycelyn Man     [place] [address]     [phone]       [facility] [place] [address]  [facility]     [address], [place], [place], [place] [address] [place]     [address]     [place], [place] [address]     [phone]       [facility] [address] [place], [place] [address] [phone] Resources for Temporary Residential Assistance/Crisis [facility]) M-F 8am-3pm   [address] [place], [place] [address]   [phone] Services include: laundry, barbering, support groups, case management, phone  & computer access, showers, AA/NA mtgs, mental health/substance abuse nurse, job skills class, disability information, VA assistance, spiritual classes, etc.   [facility]   [address] [place]     [place] (women and children)       [place]. [place], [place] [address] [phone] [email or web address] for application and process Application Required  [facility]   [address]    [place] [address]     [phone]  [place], [place] [address] [phone]([email or web address] application appt.) Application Required  [facility] (women only)    [address]     [place], [place] [address]     [phone]      Intake starts 6pm daily Need valid ID, SSC, & Police report [employer] [address] [place], [place] [phone] Application Required  [employer] (men only)     [place].      [place], [place]       [place] (Pregnant women only) [address] [place], [place]  [facility]      [place] [address].      [place], [place] [address]     [phone]             [facility] [address] [place], [place] 90 day commitment/SA/Application  process  [facility](men only)     [address]     [place], [place]       Check-in at [facility] [address] [place], [place] [address] [phone] Men/Women/Women and Children must be there by 7 pm  [place], [place]

## 2021-06-01 NOTE — Progress Notes (Signed)
CSW added community resources for outpatient therapy and medication management on pt's AVS. Pt has been psych cleared and ready for discharge per Pecolia Ades, NP.    Benjaman Kindler, MSW, [facility] 06/01/2021 4:51 PM

## 2021-06-01 NOTE — ED Provider Notes (Signed)
This patient has been cleared for discharge by psychiatry.  She has not had any complaints at this time, she still feels some depression and suicidal passive ideation but no plan at this time.  She is feeling safe to go home and follow-up in the outpatient setting.  Vital signs are unremarkable   Noemi Chapel, MD 06/01/21 [phone]

## 2021-06-01 NOTE — ED Notes (Signed)
Breakfast at bedside.

## 2021-06-01 NOTE — ED Notes (Signed)
Pt lunch at bedside 

## 2021-06-01 NOTE — BH Assessment (Addendum)
Comprehensive Clinical Assessment (CCA) Note  [birth year] Jaime Flores [id number]  Disposition: Per Pecolia Ades, NP, patient is psych cleared to discharge home. She is recommended to follow up with outpatient therapy and medication management. Disposition LCSW to assist with adding resources to patient's AVS.  Flowsheet Row ED from 05/31/2021 in [place] ED from 04/29/2021 in [place] ED from 02/26/2021 in [place] No Risk Low Risk No Risk        The patient demonstrates the following risk factors for suicide: Chronic risk factors for suicide include: psychiatric disorder of Major Depressive Disorder, Recurrent, Severe, without psychotic features and Anxiety Disorder . Acute risk factors for suicide include: family or marital conflict. Protective factors for this patient include: positive social support and responsibility to others (children, family). Considering these factors, the overall suicide risk at this point appears to be "No Risk". Patient is appropriate for outpatient follow up.   Chief Complaint:  Chief Complaint  Patient presents with   V70.1   Psychiatric Evaluation   Visit Diagnosis: Major Depressive Disorder, Recurrent, Severe, without psychotic features and Anxiety Disorder  Jaime Flores presented to [facility] 05/31/2021, stating that she was having bad thoughts last night. Her suicidal thoughts were triggered by "My mama fussing at me". She denies that she had a plan/intent. States, "I always seek help before it gets that far". Patient stating that she has a lot of support from her sister who lives next door. Her sister is the person who "talked me in to coming here last night".    She lives with her mother. She no longer has suicidal ideations and requesting to go home to see her "babies" (1 y/o and 25 y/o). States that her parents are caring for her children while she is in the  Emergency Department.  Denies that she has no hx of suicide attempts/gestures. Denies a hx of self-injuries behaviors.   Patient with history of depression. Symptoms include: hopelessness, fatigue, anger/irritability, loss of interest in usual pleasures, worthlessness. She reports sleeping 7-9 hrs per night, depending on her work schedule. Appetite is good. She reports weight gain but states it due to the medication that she is prescribed.   Patient denies homicidal ideations Denies hx of aggressive/assaultive behaviors. Denies AVH's. Denies hx of alcohol and/or drug use. Patient denies that she has an outpatient therapist and/or psychiatrist. States that prescribed Vraylar, Celexa, and Desyrel prescribed by her PCP. She is compliant wit her medications.   Denies hx of trauma and/or abuse. Patient is currently employed and works at an [employer]. Highest level of education is "some college".   CCA Screening, Triage and Referral (STR)  Patient Reported Information How did you hear about [place]? Family/Friend (Patient states that a friend drove her to the Emergency Department. The friend happens to also be a Advertising account executive of the sheriff's department".)  What Is the Reason for Your Visit/Call Today? Patient presented to [facility] 05/31/2021, stating that she was having bad thoughts last night. Her suicidal thoughts were triggered by "My mama fussing at me". She denies that she had a plan/intent. States, "I always seek help before it gets that far". Patient stating that she has a lot of support from her sister who lives next door. Her sister is the person who "talked me in to coming here last night".    She lives with her mother. She no longer has suicidal ideations and requesting to go home to see  her "babies" (80 y/o and 41 y/o). States that her parents are caring for her children while she is in the Emergency Department.  Denies that she has no hx of suicide attempts/gestures. Denies a hx of self-injuries behaviors.    Patient with history of depression. Symptoms include: hopelessness, fatigue, anger/irritability, loss of interest in usual pleasures, worthlessness. She reports sleeping 7-9 hrs per night, depending on her work schedule. Appetite is good. She reports weight gain but states it due to the medication that she is prescribed.   Patient denies homicidal ideations Denies hx of aggressive/assaultive behaviors. Denies AVH's. Denies hx of alcohol and/or drug use. Patient denies that she has an outpatient therapist and/or psychiatrist. States that prescribed Vraylar, Celexa, and Desyrel prescribed by her PCP. She is compliant wit her medications.   Denies hx of trauma and/or abuse. Patient is currently employed and works at an [employer]. Highest level of education is "some college".  How Long Has This Been Causing You Problems? > than 6 months  What Do You Feel Would Help You the Most Today? Treatment for Depression or other mood problem; Stress Management; Medication(s)   Have You Recently Had Any Thoughts About Hurting Yourself? Yes  Are You Planning to Commit Suicide/Harm Yourself At This time? No   Have you Recently Had Thoughts About [place]? No  Are You Planning to Harm Someone at This Time? No  Explanation: No data recorded  Have You Used Any Alcohol or Drugs in the Past 24 Hours? No  How Long Ago Did You Use Drugs or Alcohol? No data recorded What Did You Use and How Much? No data recorded  Do You Currently Have a Therapist/Psychiatrist? Yes  Name of Therapist/Psychiatrist: Patient denies that she has an outpatient therapist and/or psychiatrist. States that prescribed Vraylar, Celexa, and Desyrel prescribed by her PCP. She is compliant wit her medications.   Have You Been Recently Discharged From Any Office Practice or Programs? No  Explanation of Discharge From Practice/Program: No data recorded    CCA Screening Triage Referral Assessment Type of Contact:  Tele-Assessment  Telemedicine Service Delivery: Telemedicine service delivery: This service was provided via telemedicine using a 2-way, interactive audio and video technology  Is this Initial or Reassessment? Initial Assessment  Date Telepsych consult ordered in [facility]:  06/01/21  Time Telepsych consult ordered in [facility]:  No data recorded Location of Assessment: [facility]  Provider Location: [facility]   Collateral Involvement: Patient gives consent to call her step dad, "Renee Rival" [phone]   Does Patient Have a [place]? No data recorded Name and Contact of Legal Guardian: No data recorded If Minor and Not Living with Parent(s), Who has Custody? No data recorded Is CPS involved or ever been involved? Never  Is APS involved or ever been involved? Never   Patient Determined To Be At Risk for Harm To Self or Others Based on Review of Patient Reported Information or Presenting Complaint? No  Method: No data recorded Availability of Means: No data recorded Intent: No data recorded Notification Required: No data recorded Additional Information for Danger to Others Potential: No data recorded Additional Comments for Danger to Others Potential: No data recorded Are There Guns or Other Weapons in Your Home? No data recorded Types of Guns/Weapons: No data recorded Are These Weapons Safely Secured?  No data recorded Who Could Verify You Are Able To Have These Secured: No data recorded Do You Have any Outstanding Charges, Pending Court Dates, Parole/Probation? No data recorded Contacted To Inform of Risk of Harm To Self or Others: No data recorded   Does Patient Present under Involuntary Commitment? No  IVC Papers Initial File Date: No data recorded  [place] of Residence: [place]   Patient Currently Receiving the Following Services: Medication Management (Patient denies that she has an outpatient therapist and/or  psychiatrist. States that prescribed Vraylar, Celexa, and Desyrel prescribed by her PCP. She is compliant wit her medications.)   Determination of Need: Urgent (48 hours)   Options For Referral: Medication Management; Outpatient Therapy; Partial Hospitalization     CCA Biopsychosocial Patient Reported Schizophrenia/Schizoaffective Diagnosis in Past: No   Strengths: self-awareness   Mental Health Symptoms Depression:   Hopelessness; Fatigue; Tearfulness; Worthlessness; Change in energy/activity   Duration of Depressive symptoms:  Duration of Depressive Symptoms: Greater than two weeks   Mania:   None   Anxiety:    None   Psychosis:   None   Duration of Psychotic symptoms:    Trauma:   None   Obsessions:   None   Compulsions:   None   Inattention:   None   Hyperactivity/Impulsivity:   None   Oppositional/Defiant Behaviors:   None   Emotional Irregularity:   None   Other Mood/Personality Symptoms:  No data recorded   Mental Status Exam Appearance and self-care  Stature:   Average   Weight:   Average weight   Clothing:   Casual   Grooming:   Normal   Cosmetic use:   None   Posture/gait:   Normal   Motor activity:   Not Remarkable   Sensorium  Attention:   Normal   Concentration:   Normal   Orientation:   X5   Recall/memory:   Normal   Affect and Mood  Affect:   Appropriate   Mood:   Depressed   Relating  Eye contact:   Normal   Facial expression:   Responsive; Sad; Depressed   Attitude toward examiner:   Cooperative   Thought and Language  Speech flow:  Normal   Thought content:   Appropriate to Mood and Circumstances   Preoccupation:   None   Hallucinations:   None   Organization:  No data recorded  [employer] of Knowledge:   Average   Intelligence:   Average   Abstraction:   Normal   Judgement:   Fair   Art therapist:   Realistic   Insight:   Fair   Decision  Making:   Normal   Social Functioning  Social Maturity:   Responsible   Social Judgement:   Normal   Stress  Stressors:   Other (Comment) (Arguement with parents)   Coping Ability:   Overwhelmed; Exhausted   Skill Deficits:   Self-control; Self-care   Supports:   Family; Friends/Service system     Religion: Religion/Spirituality Are You A Religious Person?: No  Leisure/Recreation: Leisure / Recreation Do You Have Hobbies?: Yes Leisure and Hobbies: "spending time with kids (one is autistic) and running a lot"  Exercise/Diet: Exercise/Diet Do You Exercise?: Yes What Type of Exercise Do You Do?: Run/Walk How Many Times a Week Do You Exercise?: 1-3 times a week Have You Gained or Lost A Significant Amount of Weight in the Past Six Months?: Yes-Gained Number of Pounds Gained: 40 (She reports weight gain  but states it due to the medication that she is prescribed. Patient reporting weight gain of 40 pounds in the past year.) Do You Follow a Special Diet?: No Do You Have Any Trouble Sleeping?: No   CCA Employment/Education Employment/Work Situation: Employment / Work Situation Employment Situation: Employed Work Stressors: None reported Patient's Job has Been Impacted by Current Illness: No Has Patient ever Been in Passenger transport manager?: No  Education: Education Is Patient Currently Attending School?: No Last Grade Completed:  ("some college") Did Physicist, medical?: Yes What Type of College Degree Do you Have?: unknown Did You Have An Individualized Education Program (IIEP): No Did You Have Any Difficulty At [employer]?: No Patient's Education Has Been Impacted by Current Illness: No   CCA Family/Childhood History Family and Relationship History: Family history Marital status: Single Does patient have children?: Yes How many children?: 2  Childhood History:  Childhood History By whom was/is the patient raised?: Both parents Did patient suffer any  verbal/emotional/physical/sexual abuse as a child?: No Did patient suffer from severe childhood neglect?: No Has patient ever been sexually abused/assaulted/raped as an adolescent or adult?: No Was the patient ever a victim of a crime or a disaster?: No Witnessed domestic violence?: No Has patient been affected by domestic violence as an adult?: No  Child/Adolescent Assessment:     CCA Substance Use Alcohol/Drug Use: Alcohol / Drug Use Pain Medications: see MAR Prescriptions: see MAR Over the Counter: see MAR History of alcohol / drug use?: No history of alcohol / drug abuse                         ASAM's:  Six Dimensions of Multidimensional Assessment  Dimension 1:  Acute Intoxication and/or Withdrawal Potential:      Dimension 2:  Biomedical Conditions and Complications:      Dimension 3:  Emotional, Behavioral, or Cognitive Conditions and Complications:     Dimension 4:  Readiness to Change:     Dimension 5:  Relapse, Continued use, or Continued Problem Potential:     Dimension 6:  Recovery/Living Environment:     ASAM Severity Score:    ASAM Recommended Level of Treatment:     Substance use Disorder (SUD)    Recommendations for Services/Supports/Treatments: Recommendations for Services/Supports/Treatments Recommendations For Services/Supports/Treatments: Individual Therapy, Medication Management, Intensive In-Home Services, IOP (Intensive Outpatient Program), Partial Hospitalization  Discharge Disposition:    DSM5 Diagnoses: Patient Active Problem List   Diagnosis Date Noted   Prediabetes 02/15/2020   Anxiety 11/18/2019   Depression, recurrent ([place]) 11/18/2019   Seasonal and perennial allergic rhinitis 05/15/2019   Amenorrhea 01/27/2013     Referrals to Alternative Service(s): Referred to Alternative Service(s):   Place:   Date:   Time:    Referred to Alternative Service(s):   Place:   Date:   Time:    Referred to Alternative Service(s):    Place:   Date:   Time:    Referred to Alternative Service(s):   Place:   Date:   Time:     Waldon Merl, Counselor

## 2021-06-02 ENCOUNTER — Telehealth: Payer: Self-pay

## 2021-06-02 ENCOUNTER — Telehealth: Payer: Self-pay | Admitting: Family Medicine

## 2021-06-02 NOTE — Telephone Encounter (Signed)
Unable to reach pt, # not working

## 2021-06-02 NOTE — Telephone Encounter (Signed)
Transition Care Management Unsuccessful Follow-up Telephone Call  Date of discharge and from where:  06/01/2021-[place]   Attempts:  1st Attempt  Reason for unsuccessful TCM follow-up call:  Unable to reach patient

## 2021-06-05 NOTE — Telephone Encounter (Signed)
Appointment given for 11/17 @ 2:10pm with [name].

## 2021-06-05 NOTE — Telephone Encounter (Signed)
Transition Care Management Unsuccessful Follow-up Telephone Call  Date of discharge and from where:  06/01/2021 from [facility]  Attempts:  2nd Attempt  Reason for unsuccessful TCM follow-up call:  Unable to leave message

## 2021-06-06 NOTE — Telephone Encounter (Signed)
Transition Care Management Unsuccessful Follow-up Telephone Call  Date of discharge and from where:  06/01/2021 from [facility]  Attempts:  3rd Attempt  Reason for unsuccessful TCM follow-up call:  Unable to reach patient

## 2021-06-08 ENCOUNTER — Ambulatory Visit (INDEPENDENT_AMBULATORY_CARE_PROVIDER_SITE_OTHER): Payer: Medicaid Other | Admitting: Family Medicine

## 2021-06-08 ENCOUNTER — Encounter: Payer: Self-pay | Admitting: Family Medicine

## 2021-06-08 DIAGNOSIS — F419 Anxiety disorder, unspecified: Secondary | ICD-10-CM

## 2021-06-08 DIAGNOSIS — F339 Major depressive disorder, recurrent, unspecified: Secondary | ICD-10-CM

## 2021-06-08 DIAGNOSIS — Z7289 Other problems related to lifestyle: Secondary | ICD-10-CM | POA: Diagnosis not present

## 2021-06-08 MED ORDER — CARIPRAZINE HCL 3 MG PO CAPS: 3.0000 mg | ORAL_CAPSULE | Freq: Every day | ORAL | 0 refills | Status: DC

## 2021-06-08 NOTE — Progress Notes (Signed)
Virtual Visit via telephone Note  I connected with Jaime Flores on [birth year] at 1405 by telephone and verified that I am speaking with the correct person using two identifiers. Jaime Flores is currently located at home and patient are currently with her during visit. The provider, Fransisca Kaufmann Mataeo Ingwersen, MD is located in their office at time of visit.  Call ended at 1421  I discussed the limitations, risks, security and privacy concerns of performing an evaluation and management service by telephone and the availability of in person appointments. I also discussed with the patient that there may be a patient responsible charge related to this service. The patient expressed understanding and agreed to proceed.   History and Present Illness: Patient is calling in for depression and she had suicidal ideations and her mom telling her she felt bad and cut her wrist and went in to ER. She was there overnight and was feeling better. She is not doing well with her anxiety and depression and she has a lot of stress from work.  She denies suicidal ideations or thoughts of hurting herself right now.   1. Depression, recurrent ([place])   2. Anxiety   3. Deliberate self-cutting     Outpatient Encounter Medications as of 06/08/2021  Medication Sig   cariprazine (VRAYLAR) 3 MG capsule Take 1 capsule (3 mg total) by mouth daily.   citalopram (CELEXA) 40 MG tablet Take 1 tablet (40 mg total) by mouth daily.   traZODone (DESYREL) 50 MG tablet Take 50 mg by mouth as needed for sleep.   [DISCONTINUED] cariprazine (VRAYLAR) 1.5 MG capsule Take 1 capsule (1.5 mg total) by mouth daily.   No facility-administered encounter medications on file as of 06/08/2021.    Review of Systems  Constitutional:  Negative for chills and fever.  Eyes:  Negative for visual disturbance.  Respiratory:  Negative for chest tightness and shortness of breath.   Cardiovascular:  Negative for chest pain and leg swelling.   Musculoskeletal:  Negative for back pain and gait problem.  Skin:  Negative for rash.  Psychiatric/Behavioral:  Positive for decreased concentration, dysphoric mood, self-injury and sleep disturbance. Negative for agitation, behavioral problems and suicidal ideas. The patient is nervous/anxious.   All other systems reviewed and are negative.  Observations/Objective: Patient sounds comfortable and in no distress now, no plans of suicide now  Assessment and Plan: Problem List Items Addressed This Visit       Other   Anxiety   Relevant Medications   cariprazine (VRAYLAR) 3 MG capsule   Other Relevant Orders   Ambulatory referral to Psychiatry   Depression, recurrent ([place]) - Primary   Relevant Medications   cariprazine (VRAYLAR) 3 MG capsule   Other Relevant Orders   Ambulatory referral to Psychiatry   Other Visit Diagnoses     Deliberate self-cutting       Relevant Medications   cariprazine (VRAYLAR) 3 MG capsule   Other Relevant Orders   Ambulatory referral to Psychiatry      Will increase vraylar to 3 mg and sent new prescription.  She is not doing as well and discussed options and plans to get better, placed referral to psychiatry and psychology.  Inform patient of emergency lines or to call us or to call 911 if she has suicidal ideation or thoughts of hurting her self again.  Follow up plan: Return in about 3 weeks (around 06/29/2021), or if symptoms worsen or fail to improve, for Depression anxiety follow-up.  I discussed the assessment and treatment plan with the patient. The patient was provided an opportunity to ask questions and all were answered. The patient agreed with the plan and demonstrated an understanding of the instructions.   The patient was advised to call back or seek an in-person evaluation if the symptoms worsen or if the condition fails to improve as anticipated.  The above assessment and management plan was discussed with the patient. The  patient verbalized understanding of and has agreed to the management plan. Patient is aware to call the clinic if symptoms persist or worsen. Patient is aware when to return to the clinic for a follow-up visit. Patient educated on when it is appropriate to go to the emergency department.    I provided 16 minutes of non-face-to-face time during this encounter.    [name], MD

## 2021-06-13 ENCOUNTER — Ambulatory Visit
Admission: EM | Admit: 2021-06-13 | Discharge: 2021-06-13 | Disposition: A | Discharge status: Home / Self Care | Payer: Medicaid Other | Attending: Student | Admitting: Student

## 2021-06-13 ENCOUNTER — Other Ambulatory Visit: Payer: Self-pay

## 2021-06-13 DIAGNOSIS — Z3202 Encounter for pregnancy test, result negative: Secondary | ICD-10-CM

## 2021-06-13 DIAGNOSIS — J111 Influenza due to unidentified influenza virus with other respiratory manifestations: Secondary | ICD-10-CM | POA: Diagnosis not present

## 2021-06-13 DIAGNOSIS — Z1152 Encounter for screening for COVID-19: Secondary | ICD-10-CM | POA: Diagnosis not present

## 2021-06-13 LAB — POCT URINE PREGNANCY: Preg Test, Ur: NEGATIVE

## 2021-06-13 LAB — POCT URINALYSIS DIP (MANUAL ENTRY)
Bilirubin, UA: NEGATIVE
Glucose, UA: NEGATIVE mg/dL
Ketones, POC UA: NEGATIVE mg/dL
Nitrite, UA: NEGATIVE
Protein Ur, POC: NEGATIVE mg/dL
Spec Grav, UA: 1.02 (ref 1.010–1.025)
Urobilinogen, UA: 0.2 E.U./dL
pH, UA: 6 (ref 5.0–8.0)

## 2021-06-13 MED ORDER — ONDANSETRON 8 MG PO TBDP: 8.0000 mg | ORAL_TABLET | Freq: Three times a day (TID) | ORAL | 0 refills | Status: DC | PRN

## 2021-06-13 NOTE — ED Triage Notes (Signed)
Patient presents to Urgent Care with complaints of sore throat, abdominal pain, cough, fatigue, body aches, and congestion. Not treating symptoms with meds. Daughter has flu. Requesting flu and pregnancy test.

## 2021-06-13 NOTE — Discharge Instructions (Signed)
-  Take the Zofran (ondansetron) up to 3 times daily for nausea and vomiting. -For fevers/chills, bodyaches, headaches- You can take Tylenol up to 1000 mg 3 times daily, and ibuprofen up to 600 mg 3 times daily with food.  You can take these together, or alternate every 3-4 hours. -Drink plenty of water/gatorade and get plenty of rest -With a virus, you're typically contagious for 5-7 days, or as long as you're having fevers.  -Come back and see [place] if things are getting worse instead of better, like shortness of breath, chest pain, fevers and chills that are getting higher instead of lower and do not come down with Tylenol or ibuprofen, etc.

## 2021-06-13 NOTE — ED Provider Notes (Signed)
[facility]    CSN: [id number] Arrival date & time: 06/13/21  1603      History   Chief Complaint Chief Complaint  Patient presents with   Sore Throat   Cough   Fever   Fatigue   Nasal Congestion   Generalized Body Aches   Nausea    HPI Jaime Flores is a 35 y.o. female presenting with flulike illness following exposure to daughter with the flu.  Describes sore throat, abdominal pain, cough, fatigue, generalized body aches, nasal congestion.  Has not taken any medications.  Has not monitored her temperature at home.  Requesting flu test and pregnancy test.  States she does not want Tamiflu as this made her nauseous in the past.  HPI  Past Medical History:  Diagnosis Date   Amenorrhea 01/27/2013   Angio-edema    Breast pain 03/10/2015   Encounter for menstrual regulation 07/05/2015   History of pseudoseizure    Irregular intermenstrual bleeding 07/28/2015   Irregular periods 07/05/2015   Screening for STD (sexually transmitted disease) 08/07/2013   Urticaria    UTI (urinary tract infection)    Vaginal discharge 06/10/2013    Patient Active Problem List   Diagnosis Date Noted   Prediabetes 02/15/2020   Anxiety 11/18/2019   Depression, recurrent ([place]) 11/18/2019   Seasonal and perennial allergic rhinitis 05/15/2019   Amenorrhea 01/27/2013    Past Surgical History:  Procedure Laterality Date   APPENDECTOMY     CHOLECYSTECTOMY     TUBAL LIGATION      OB History     Gravida  2   Para  2   Term  1   Preterm  1   AB      Living  2      SAB      IAB      Ectopic      Multiple      Live Births               Home Medications    Prior to Admission medications   Medication Sig Start Date End Date Taking? Authorizing Provider  ondansetron (ZOFRAN-ODT) 8 MG disintegrating tablet Take 1 tablet (8 mg total) by mouth every 8 (eight) hours as needed for nausea or vomiting. 06/13/21  Yes Hazel Sams, PA-C  cariprazine  (VRAYLAR) 3 MG capsule Take 1 capsule (3 mg total) by mouth daily. 06/08/21   [name], Fransisca Kaufmann, MD  citalopram (CELEXA) 40 MG tablet Take 1 tablet (40 mg total) by mouth daily. 12/07/20   [name], Fransisca Kaufmann, MD  traZODone (DESYREL) 50 MG tablet Take 50 mg by mouth as needed for sleep.    [provider]    Family History Family History  Problem Relation Age of Onset   Autism Son    Diabetes Maternal Grandmother    Stroke Maternal Grandmother    Heart failure Maternal Grandmother    Stroke Maternal Grandfather    Stroke Father    Seizures Father    Allergies Father        penicillin   Heart attack Father    Other Mother        herpes   Hyperlipidemia Mother    Heart failure Paternal Grandfather    Diabetes Paternal Grandmother    Hypertension Paternal Grandmother    Autism Brother    Other Paternal Uncle        heart exploded   Cancer Maternal Aunt    Diabetes Maternal  Aunt    Hypertension Maternal Aunt    Immunodeficiency Neg Hx    Urticaria Neg Hx    Eczema Neg Hx    Atopy Neg Hx    Asthma Neg Hx    Angioedema Neg Hx    Allergic rhinitis Neg Hx     Social History Social History   Tobacco Use   Smoking status: Every Day    Packs/day: 0.50    Types: Cigarettes   Smokeless tobacco: Never  Vaping Use   Vaping Use: Never used  Substance Use Topics   Alcohol use: No   Drug use: No     Allergies   Catfish [fish allergy], Doxycycline, Wellbutrin [bupropion], and Tape   Review of Systems Review of Systems  Constitutional:  Positive for chills and fever. Negative for appetite change.  HENT:  Positive for congestion. Negative for ear pain, rhinorrhea, sinus pressure, sinus pain and sore throat.   Eyes:  Negative for redness and visual disturbance.  Respiratory:  Positive for cough. Negative for chest tightness, shortness of breath and wheezing.   Cardiovascular:  Negative for chest pain and palpitations.  Gastrointestinal:  Negative for abdominal  pain, constipation, diarrhea, nausea and vomiting.  Genitourinary:  Negative for dysuria, frequency and urgency.  Musculoskeletal:  Positive for myalgias.  Neurological:  Negative for dizziness, weakness and headaches.  Psychiatric/Behavioral:  Negative for confusion.   All other systems reviewed and are negative.   Physical Exam Triage Vital Signs ED Triage Vitals  Enc Vitals Group     BP 06/13/21 1816 119/74     Pulse Rate 06/13/21 1816 79     Resp 06/13/21 1816 16     Temp 06/13/21 1816 98.9 F (37.2 C)     Temp Source 06/13/21 1816 Oral     SpO2 06/13/21 1816 98 %     Weight --      Height --      Head Circumference --      Peak Flow --      Pain Score 06/13/21 1815 0     Pain Loc --      Pain Edu? --      Excl. in [place]? --    No data found.  Updated Vital Signs BP 119/74 (BP Location: Right Arm)   Pulse 79   Temp 98.9 F (37.2 C) (Oral)   Resp 16   LMP 04/15/2021   SpO2 98%   Visual Acuity Right Eye Distance:   Left Eye Distance:   Bilateral Distance:    Right Eye Near:   Left Eye Near:    Bilateral Near:     Physical Exam Vitals reviewed.  Constitutional:      General: She is not in acute distress.    Appearance: Normal appearance. She is not ill-appearing.  HENT:     Head: Normocephalic and atraumatic.     Right Ear: Tympanic membrane, ear canal and external ear normal. No tenderness. No middle ear effusion. There is no impacted cerumen. Tympanic membrane is not perforated, erythematous, retracted or bulging.     Left Ear: Tympanic membrane, ear canal and external ear normal. No tenderness.  No middle ear effusion. There is no impacted cerumen. Tympanic membrane is not perforated, erythematous, retracted or bulging.     Nose: Nose normal. No congestion.     Mouth/Throat:     Mouth: Mucous membranes are moist.     Pharynx: Uvula midline. No oropharyngeal exudate or posterior oropharyngeal erythema.  Eyes:  Extraocular Movements: Extraocular  movements intact.     Pupils: Pupils are equal, round, and reactive to light.  Cardiovascular:     Rate and Rhythm: Normal rate and regular rhythm.     Heart sounds: Normal heart sounds.  Pulmonary:     Effort: Pulmonary effort is normal.     Breath sounds: Normal breath sounds. No decreased breath sounds, wheezing, rhonchi or rales.  Abdominal:     Palpations: Abdomen is soft.     Tenderness: There is no abdominal tenderness. There is no guarding or rebound.  Lymphadenopathy:     Cervical: No cervical adenopathy.     Right cervical: No superficial cervical adenopathy.    Left cervical: No superficial cervical adenopathy.  Neurological:     General: No focal deficit present.     Mental Status: She is alert and oriented to person, place, and time.  Psychiatric:        Mood and Affect: Mood normal.        Behavior: Behavior normal.        Thought Content: Thought content normal.        Judgment: Judgment normal.     UC Treatments / Results  Labs (all labs ordered are listed, but only abnormal results are displayed) Labs Reviewed  POCT URINALYSIS DIP (MANUAL ENTRY) - Abnormal; Notable for the following components:      Result Value   Blood, UA small (*)    Leukocytes, UA Small (1+) (*)    All other components within normal limits  COVID-19, FLU A+B NAA  POCT URINE PREGNANCY    EKG   Radiology No results found.  Procedures Procedures (including critical care time)  Medications Ordered in UC Medications - No data to display  Initial Impression / Assessment and Plan / UC Course  I have reviewed the triage vital signs and the nursing notes.  Pertinent labs & imaging results that were available during my care of the patient were reviewed by me and considered in my medical decision making (see chart for details).     This patient is a very pleasant 35 y.o. year old female presenting with flulike illness following exposure to flu. Afebrile, nontachy.  U-preg  negative. UA was drawn incidentally, she does not have urinary symptoms, did not send culture.  Covid and influenza PCR sent. Declines tamiflu. Zofran ODT sent.   ED return precautions discussed. Patient verbalizes understanding and agreement.    Final Clinical Impressions(s) / UC Diagnoses   Final diagnoses:  Encounter for screening for COVID-19  Influenza with respiratory manifestation  Negative pregnancy test     Discharge Instructions      -Take the Zofran (ondansetron) up to 3 times daily for nausea and vomiting. -For fevers/chills, bodyaches, headaches- You can take Tylenol up to 1000 mg 3 times daily, and ibuprofen up to 600 mg 3 times daily with food.  You can take these together, or alternate every 3-4 hours. -Drink plenty of water/gatorade and get plenty of rest -With a virus, you're typically contagious for 5-7 days, or as long as you're having fevers.  -Come back and see [place] if things are getting worse instead of better, like shortness of breath, chest pain, fevers and chills that are getting higher instead of lower and do not come down with Tylenol or ibuprofen, etc.    ED Prescriptions     Medication Sig Dispense Auth. Provider   ondansetron (ZOFRAN-ODT) 8 MG disintegrating tablet Take 1 tablet (8 mg total)  by mouth every 8 (eight) hours as needed for nausea or vomiting. 20 tablet Hazel Sams, PA-C      PDMP not reviewed this encounter.   Hazel Sams, PA-C 06/13/21 1857

## 2021-06-14 ENCOUNTER — Ambulatory Visit (INDEPENDENT_AMBULATORY_CARE_PROVIDER_SITE_OTHER): Payer: Medicaid Other | Admitting: Family Medicine

## 2021-06-14 ENCOUNTER — Encounter: Payer: Self-pay | Admitting: Family Medicine

## 2021-06-14 DIAGNOSIS — Z20828 Contact with and (suspected) exposure to other viral communicable diseases: Secondary | ICD-10-CM

## 2021-06-14 DIAGNOSIS — R6889 Other general symptoms and signs: Secondary | ICD-10-CM

## 2021-06-14 LAB — COVID-19, FLU A+B NAA
Influenza A, NAA: NOT DETECTED
Influenza B, NAA: NOT DETECTED
SARS-CoV-2, NAA: NOT DETECTED

## 2021-06-14 MED ORDER — OSELTAMIVIR PHOSPHATE 75 MG PO CAPS
75.0000 mg | ORAL_CAPSULE | Freq: Two times a day (BID) | ORAL | 0 refills | Status: AC
Start: 2021-06-14 — End: 2021-06-19

## 2021-06-14 NOTE — Progress Notes (Signed)
Virtual Visit via telephone Note Due to COVID-19 pandemic this visit was conducted virtually. This visit type was conducted due to national recommendations for restrictions regarding the COVID-19 Pandemic (e.g. social distancing, sheltering in place) in an effort to limit this patient's exposure and mitigate transmission in our community. All issues noted in this document were discussed and addressed.  A physical exam was not performed with this format.   I connected with Jaime Flores on 03/47/4259 at [facility] by telephone and verified that I am speaking with the correct person using two identifiers. CASSIDIE VEIGA is currently located at home and family is currently with them during visit. The provider, Monia Pouch, FNP is located in their office at time of visit.  I discussed the limitations, risks, security and privacy concerns of performing an evaluation and management service by telephone and the availability of in person appointments. I also discussed with the patient that there may be a patient responsible charge related to this service. The patient expressed understanding and agreed to proceed.  Subjective:  Patient ID: Jaime Flores, female    DOB: [birth year], 35 y.o.   MRN: [id number]  Chief Complaint:  Influenza   HPI: Jaime Flores is a 35 y.o. female presenting on 06/14/2021 for Influenza   Patient reports headache, fever, chills, myalgias, cough, congestion, and malaise.  States symptoms started yesterday.  Her daughter was recently diagnosed with influenza.  She has been taking Tylenol and an over-the-counter cold and cough medicine without relief of symptoms.  Influenza This is a new problem. The current episode started yesterday. The problem occurs constantly. The problem has been gradually worsening. Associated symptoms include chills, congestion, coughing, fatigue, a fever, headaches and myalgias. Pertinent negatives include no diaphoresis or weakness. She has  tried acetaminophen for the symptoms. The treatment provided no relief.    Relevant past medical, surgical, family, and social history reviewed and updated as indicated.  Allergies and medications reviewed and updated.   Past Medical History:  Diagnosis Date   Amenorrhea 01/27/2013   Angio-edema    Breast pain 03/10/2015   Encounter for menstrual regulation 07/05/2015   History of pseudoseizure    Irregular intermenstrual bleeding 07/28/2015   Irregular periods 07/05/2015   Screening for STD (sexually transmitted disease) 08/07/2013   Urticaria    UTI (urinary tract infection)    Vaginal discharge 06/10/2013    Past Surgical History:  Procedure Laterality Date   APPENDECTOMY     CHOLECYSTECTOMY     TUBAL LIGATION      Social History   Socioeconomic History   Marital status: Single    Spouse name: Not on file   Number of children: 2   Years of education: 12   Highest education level: High school graduate  Occupational History   Occupation: Writer at [employer]  Tobacco Use   Smoking status: Every Day    Packs/day: 0.50    Types: Cigarettes   Smokeless tobacco: Never  Vaping Use   Vaping Use: Never used  Substance and Sexual Activity   Alcohol use: No   Drug use: No   Sexual activity: Yes    Birth control/protection: Surgical    Comment: tubal  Other Topics Concern   Not on file  Social History Narrative   Lives with her two children and her mother.    Right-handed.   Caffeine use: 0.5 - 0.75 liters of soda per day, two cups Mocha Frappe on weekdays   Social Determinants  of Health   Financial Resource Strain: Not on file  Food Insecurity: Not on file  Transportation Needs: Not on file  Physical Activity: Not on file  Stress: Not on file  Social Connections: Not on file  Intimate Partner Violence: Not on file    Outpatient Encounter Medications as of 06/14/2021  Medication Sig   cariprazine (VRAYLAR) 3 MG capsule Take 1 capsule (3 mg total)  by mouth daily.   citalopram (CELEXA) 40 MG tablet Take 1 tablet (40 mg total) by mouth daily.   ondansetron (ZOFRAN-ODT) 8 MG disintegrating tablet Take 1 tablet (8 mg total) by mouth every 8 (eight) hours as needed for nausea or vomiting.   oseltamivir (TAMIFLU) 75 MG capsule Take 1 capsule (75 mg total) by mouth 2 (two) times daily for 5 days.   traZODone (DESYREL) 50 MG tablet Take 50 mg by mouth as needed for sleep.   No facility-administered encounter medications on file as of 06/14/2021.    Allergies  Allergen Reactions   Catfish [Fish Allergy] Shortness Of Breath   Doxycycline Shortness Of Breath   Wellbutrin [Bupropion] Shortness Of Breath, Nausea Only and Palpitations   Tape Swelling    Plastic tape please use paper    Review of Systems  Constitutional:  Positive for activity change, appetite change, chills, fatigue and fever. Negative for diaphoresis and unexpected weight change.  HENT:  Positive for congestion, postnasal drip and rhinorrhea.   Respiratory:  Positive for cough.   Genitourinary:  Negative for decreased urine volume.  Musculoskeletal:  Positive for myalgias.  Neurological:  Positive for headaches. Negative for weakness.  Psychiatric/Behavioral:  Negative for confusion.   All other systems reviewed and are negative.       Observations/Objective: No vital signs or physical exam, this was a telephone or virtual health encounter.  Pt alert and oriented, answers all questions appropriately, and able to speak in full sentences.    Assessment and Plan: Amyrie was seen today for influenza.  Diagnoses and all orders for this visit:  Flu-like symptoms Exposure to influenza Symptoms consistent with influenza with known exposure.  Will initiate Tamiflu as prescribed.  Patient aware to continue symptomatic care at home along with adequate hydration.  Patient aware to report any new, worsening, or persistent symptoms.  Follow-up as needed. -     oseltamivir  (TAMIFLU) 75 MG capsule; Take 1 capsule (75 mg total) by mouth 2 (two) times daily for 5 days.     Follow Up Instructions: Return if symptoms worsen or fail to improve.    I discussed the assessment and treatment plan with the patient. The patient was provided an opportunity to ask questions and all were answered. The patient agreed with the plan and demonstrated an understanding of the instructions.   The patient was advised to call back or seek an in-person evaluation if the symptoms worsen or if the condition fails to improve as anticipated.  The above assessment and management plan was discussed with the patient. The patient verbalized understanding of and has agreed to the management plan. Patient is aware to call the clinic if they develop any new symptoms or if symptoms persist or worsen. Patient is aware when to return to the clinic for a follow-up visit. Patient educated on when it is appropriate to go to the emergency department.    I provided 11 minutes of non-face-to-face time during this encounter. The call started at [place]. The call ended at 74. The other time was used  for coordination of care.    Monia Pouch, FNP-C [facility] [address] [place], [place] [address] [phone] 06/14/2021

## 2021-06-25 ENCOUNTER — Ambulatory Visit
Admission: EM | Admit: 2021-06-25 | Discharge: 2021-06-25 | Disposition: A | Discharge status: Home / Self Care | Payer: Medicaid Other | Attending: Family Medicine | Admitting: Family Medicine

## 2021-06-25 ENCOUNTER — Other Ambulatory Visit: Payer: Self-pay

## 2021-06-25 DIAGNOSIS — Z20828 Contact with and (suspected) exposure to other viral communicable diseases: Secondary | ICD-10-CM

## 2021-06-25 DIAGNOSIS — R110 Nausea: Secondary | ICD-10-CM | POA: Diagnosis not present

## 2021-06-25 DIAGNOSIS — R1111 Vomiting without nausea: Secondary | ICD-10-CM | POA: Diagnosis not present

## 2021-06-25 DIAGNOSIS — R112 Nausea with vomiting, unspecified: Secondary | ICD-10-CM | POA: Diagnosis not present

## 2021-06-25 DIAGNOSIS — J069 Acute upper respiratory infection, unspecified: Secondary | ICD-10-CM

## 2021-06-25 LAB — POCT RAPID STREP A (OFFICE): Rapid Strep A Screen: NEGATIVE

## 2021-06-25 MED ORDER — PROMETHAZINE-DM 6.25-15 MG/5ML PO SYRP: 5.0000 mL | ORAL_SOLUTION | Freq: Four times a day (QID) | ORAL | 0 refills | Status: DC | PRN

## 2021-06-25 MED ORDER — OSELTAMIVIR PHOSPHATE 75 MG PO CAPS: 75.0000 mg | ORAL_CAPSULE | Freq: Two times a day (BID) | ORAL | 0 refills | Status: DC

## 2021-06-25 NOTE — ED Triage Notes (Signed)
Patient states she is having a sore throat, cough, body chills, fever sore throat and headache since yesterday.  She states her fever was 102.0 at midnight last night.  She states she took Mucinex and cold and flu meds.

## 2021-06-25 NOTE — ED Provider Notes (Signed)
[facility]    CSN: [id number] Arrival date & time: 06/25/21  1047      History   Chief Complaint No chief complaint on file.   HPI Jaime Flores is a 35 y.o. female.   Presenting today with 1 day history of acute onset fever, body aches, chills, sore throat, cough, sweats, headache, fatigue.  Denies chest pain, shortness of breath, abdominal pain, nausea vomiting or diarrhea.  Taking Mucinex and over-the-counter cold and flu medications with only mild temporary relief of symptoms.  Multiple sick contacts with the flu recently.  No known pulmonary disease.   Past Medical History:  Diagnosis Date   Amenorrhea 01/27/2013   Angio-edema    Breast pain 03/10/2015   Encounter for menstrual regulation 07/05/2015   History of pseudoseizure    Irregular intermenstrual bleeding 07/28/2015   Irregular periods 07/05/2015   Screening for STD (sexually transmitted disease) 08/07/2013   Urticaria    UTI (urinary tract infection)    Vaginal discharge 06/10/2013    Patient Active Problem List   Diagnosis Date Noted   Prediabetes 02/15/2020   Anxiety 11/18/2019   Depression, recurrent ([place]) 11/18/2019   Seasonal and perennial allergic rhinitis 05/15/2019   Amenorrhea 01/27/2013    Past Surgical History:  Procedure Laterality Date   APPENDECTOMY     CHOLECYSTECTOMY     TUBAL LIGATION      OB History     Gravida  2   Para  2   Term  1   Preterm  1   AB      Living  2      SAB      IAB      Ectopic      Multiple      Live Births               Home Medications    Prior to Admission medications   Medication Sig Start Date End Date Taking? Authorizing Provider  oseltamivir (TAMIFLU) 75 MG capsule Take 1 capsule (75 mg total) by mouth every 12 (twelve) hours. 06/25/21  Yes Volney American, PA-C  promethazine-dextromethorphan (PROMETHAZINE-DM) 6.25-15 MG/5ML syrup Take 5 mLs by mouth 4 (four) times daily as needed. 06/25/21  Yes Volney American, PA-C  cariprazine (VRAYLAR) 3 MG capsule Take 1 capsule (3 mg total) by mouth daily. 06/08/21   [name], Fransisca Kaufmann, MD  citalopram (CELEXA) 40 MG tablet Take 1 tablet (40 mg total) by mouth daily. 12/07/20   [name], Fransisca Kaufmann, MD  ondansetron (ZOFRAN-ODT) 8 MG disintegrating tablet Take 1 tablet (8 mg total) by mouth every 8 (eight) hours as needed for nausea or vomiting. 06/13/21   Hazel Sams, PA-C  traZODone (DESYREL) 50 MG tablet Take 50 mg by mouth as needed for sleep.    [provider]    Family History Family History  Problem Relation Age of Onset   Autism Son    Diabetes Maternal Grandmother    Stroke Maternal Grandmother    Heart failure Maternal Grandmother    Stroke Maternal Grandfather    Stroke Father    Seizures Father    Allergies Father        penicillin   Heart attack Father    Other Mother        herpes   Hyperlipidemia Mother    Heart failure Paternal Grandfather    Diabetes Paternal Grandmother    Hypertension Paternal Grandmother    Autism Brother  Other Paternal Uncle        heart exploded   Cancer Maternal Aunt    Diabetes Maternal Aunt    Hypertension Maternal Aunt    Immunodeficiency Neg Hx    Urticaria Neg Hx    Eczema Neg Hx    Atopy Neg Hx    Asthma Neg Hx    Angioedema Neg Hx    Allergic rhinitis Neg Hx     Social History Social History   Tobacco Use   Smoking status: Every Day    Packs/day: 0.50    Types: Cigarettes   Smokeless tobacco: Never  Vaping Use   Vaping Use: Never used  Substance Use Topics   Alcohol use: No   Drug use: No     Allergies   Catfish [fish allergy], Doxycycline, Wellbutrin [bupropion], and Tape   Review of Systems Review of Systems Per HPI  Physical Exam Triage Vital Signs ED Triage Vitals  Enc Vitals Group     BP 06/25/21 1217 109/68     Pulse Rate 06/25/21 1217 85     Resp 06/25/21 1217 18     Temp 06/25/21 1217 99.1 F (37.3 C)     Temp Source 06/25/21  1217 Oral     SpO2 06/25/21 1217 96 %     Weight --      Height --      Head Circumference --      Peak Flow --      Pain Score 06/25/21 1213 8     Pain Loc --      Pain Edu? --      Excl. in [place]? --    No data found.  Updated Vital Signs BP 109/68 (BP Location: Right Arm)   Pulse 85   Temp 99.1 F (37.3 C) (Oral)   Resp 18   LMP 06/17/2021 (Exact Date)   SpO2 96%   Visual Acuity Right Eye Distance:   Left Eye Distance:   Bilateral Distance:    Right Eye Near:   Left Eye Near:    Bilateral Near:     Physical Exam Vitals and nursing note reviewed.  Constitutional:      Appearance: Normal appearance.  HENT:     Head: Atraumatic.     Right Ear: Tympanic membrane and external ear normal.     Left Ear: Tympanic membrane and external ear normal.     Nose: Rhinorrhea present.     Mouth/Throat:     Mouth: Mucous membranes are moist.     Pharynx: Posterior oropharyngeal erythema present.  Eyes:     Extraocular Movements: Extraocular movements intact.     Conjunctiva/sclera: Conjunctivae normal.  Cardiovascular:     Rate and Rhythm: Normal rate and regular rhythm.     Heart sounds: Normal heart sounds.  Pulmonary:     Effort: Pulmonary effort is normal.     Breath sounds: Normal breath sounds. No wheezing or rales.  Abdominal:     General: Bowel sounds are normal. There is no distension.     Palpations: Abdomen is soft.     Tenderness: There is no abdominal tenderness. There is no guarding.  Musculoskeletal:        General: Normal range of motion.     Cervical back: Normal range of motion and neck supple.  Skin:    General: Skin is warm and dry.  Neurological:     Mental Status: She is alert and oriented to person, place, and time.  Psychiatric:        Mood and Affect: Mood normal.        Thought Content: Thought content normal.     UC Treatments / Results  Labs (all labs ordered are listed, but only abnormal results are displayed) Labs Reviewed   COVID-19, FLU A+B NAA  POCT RAPID STREP A (OFFICE)    EKG   Radiology No results found.  Procedures Procedures (including critical care time)  Medications Ordered in UC Medications - No data to display  Initial Impression / Assessment and Plan / UC Course  I have reviewed the triage vital signs and the nursing notes.  Pertinent labs & imaging results that were available during my care of the patient were reviewed by me and considered in my medical decision making (see chart for details).     Patient peering in no acute distress today, vital signs benign within normal limits.  Consistent with viral upper respiratory infection, strong suspicion for influenza given symptoms and exposures.  We will start Tamiflu proactively while awaiting COVID and flu results.  Phenergan DM for symptomatic benefit.  Discussed over-the-counter supportive home care and medications.  Return for acutely worsening symptoms.  Work note given.  Final Clinical Impressions(s) / UC Diagnoses   Final diagnoses:  Exposure to the flu  Viral URI with cough   Discharge Instructions   None    ED Prescriptions     Medication Sig Dispense Auth. Provider   oseltamivir (TAMIFLU) 75 MG capsule Take 1 capsule (75 mg total) by mouth every 12 (twelve) hours. 10 capsule Volney American, [place]   promethazine-dextromethorphan (PROMETHAZINE-DM) 6.25-15 MG/5ML syrup Take 5 mLs by mouth 4 (four) times daily as needed. 100 mL Volney American, [place]      PDMP not reviewed this encounter.   Volney American, [place] 06/25/21 1309

## 2021-06-26 ENCOUNTER — Ambulatory Visit: Payer: Medicaid Other | Admitting: Family Medicine

## 2021-06-26 ENCOUNTER — Encounter: Payer: Self-pay | Admitting: Family Medicine

## 2021-06-26 DIAGNOSIS — F419 Anxiety disorder, unspecified: Secondary | ICD-10-CM

## 2021-06-26 DIAGNOSIS — F339 Major depressive disorder, recurrent, unspecified: Secondary | ICD-10-CM | POA: Diagnosis not present

## 2021-06-26 DIAGNOSIS — Z7289 Other problems related to lifestyle: Secondary | ICD-10-CM | POA: Diagnosis not present

## 2021-06-26 LAB — COVID-19, FLU A+B NAA
Influenza A, NAA: NOT DETECTED
Influenza B, NAA: NOT DETECTED
SARS-CoV-2, NAA: NOT DETECTED

## 2021-06-26 MED ORDER — CARIPRAZINE HCL 3 MG PO CAPS: 3.0000 mg | ORAL_CAPSULE | Freq: Every day | ORAL | 3 refills | Status: DC

## 2021-06-26 MED ORDER — OSELTAMIVIR PHOSPHATE 75 MG PO CAPS: 75.0000 mg | ORAL_CAPSULE | Freq: Two times a day (BID) | ORAL | 0 refills | Status: DC

## 2021-06-26 NOTE — Progress Notes (Signed)
Virtual Visit via telephone Note  I connected with Jaime Flores on [birth year] at [phone] by telephone and verified that I am speaking with the correct person using two identifiers. Jaime Flores is currently located at home and patient are currently with her during visit. The provider, Fransisca Kaufmann Olamide Carattini, MD is located in their office at time of visit.  Call ended at [phone]  I discussed the limitations, risks, security and privacy concerns of performing an evaluation and management service by telephone and the availability of in person appointments. I also discussed with the patient that there may be a patient responsible charge related to this service. The patient expressed understanding and agreed to proceed.   History and Present Illness: Patient is having flu and started 2 days and tested but did not get back results yet. She did not get treated yet.  Anxiety and depression She is not liking vraylar because sleepiness but when switched to night it does a lot better.  She denies suicidal ideations or thoughts of hurting self.  She is sleeping better. She is satisfied with how she feels and is not having bad thoughts. She denies any major anxiety and is doing a lot better.   1. Anxiety   2. Depression, recurrent ([place])   3. Deliberate self-cutting     Outpatient Encounter Medications as of 06/26/2021  Medication Sig   cariprazine (VRAYLAR) 3 MG capsule Take 1 capsule (3 mg total) by mouth daily.   citalopram (CELEXA) 40 MG tablet Take 1 tablet (40 mg total) by mouth daily.   ondansetron (ZOFRAN-ODT) 8 MG disintegrating tablet Take 1 tablet (8 mg total) by mouth every 8 (eight) hours as needed for nausea or vomiting.   oseltamivir (TAMIFLU) 75 MG capsule Take 1 capsule (75 mg total) by mouth every 12 (twelve) hours.   promethazine-dextromethorphan (PROMETHAZINE-DM) 6.25-15 MG/5ML syrup Take 5 mLs by mouth 4 (four) times daily as needed.   traZODone (DESYREL) 50 MG tablet Take 50 mg by  mouth as needed for sleep.   [DISCONTINUED] cariprazine (VRAYLAR) 3 MG capsule Take 1 capsule (3 mg total) by mouth daily.   [DISCONTINUED] oseltamivir (TAMIFLU) 75 MG capsule Take 1 capsule (75 mg total) by mouth every 12 (twelve) hours.   No facility-administered encounter medications on file as of 06/26/2021.    Review of Systems  Constitutional:  Positive for chills and fever.  HENT:  Positive for congestion. Negative for ear discharge and ear pain.   Eyes:  Negative for visual disturbance.  Respiratory:  Positive for cough. Negative for chest tightness and shortness of breath.   Cardiovascular:  Negative for chest pain and leg swelling.  Genitourinary:  Negative for difficulty urinating and dysuria.  Musculoskeletal:  Positive for myalgias. Negative for back pain and gait problem.  Skin:  Negative for rash.  Neurological:  Negative for dizziness, light-headedness and headaches.  Psychiatric/Behavioral:  Negative for agitation and behavioral problems.   All other systems reviewed and are negative.  Observations/Objective: Patient sounds comfortable and in no acute distress  Assessment and Plan: Problem List Items Addressed This Visit       Other   Anxiety - Primary   Relevant Medications   cariprazine (VRAYLAR) 3 MG capsule   Depression, recurrent (HCC)   Relevant Medications   cariprazine (VRAYLAR) 3 MG capsule   Other Visit Diagnoses     Deliberate self-cutting       Relevant Medications   cariprazine (VRAYLAR) 3 MG capsule  Patient is dealing with the fluid and that she is feeling a lot better.  She feels like the Vraylar does very well, she did switch to nighttime because it made her sleepy but her anxiety and depression and everything is doing better. Follow up plan: Return in about 4 weeks (around 07/24/2021), or if symptoms worsen or fail to improve, for anxiety and depression.     I discussed the assessment and treatment plan with the patient. The  patient was provided an opportunity to ask questions and all were answered. The patient agreed with the plan and demonstrated an understanding of the instructions.   The patient was advised to call back or seek an in-person evaluation if the symptoms worsen or if the condition fails to improve as anticipated.  The above assessment and management plan was discussed with the patient. The patient verbalized understanding of and has agreed to the management plan. Patient is aware to call the clinic if symptoms persist or worsen. Patient is aware when to return to the clinic for a follow-up visit. Patient educated on when it is appropriate to go to the emergency department.    I provided 11 minutes of non-face-to-face time during this encounter.    [name], MD

## 2021-07-04 ENCOUNTER — Encounter: Payer: Self-pay | Admitting: Nurse Practitioner

## 2021-07-04 ENCOUNTER — Ambulatory Visit (INDEPENDENT_AMBULATORY_CARE_PROVIDER_SITE_OTHER): Payer: Medicaid Other | Admitting: Nurse Practitioner

## 2021-07-04 DIAGNOSIS — F339 Major depressive disorder, recurrent, unspecified: Secondary | ICD-10-CM

## 2021-07-04 MED ORDER — QUETIAPINE FUMARATE 50 MG PO TABS: 50.0000 mg | ORAL_TABLET | Freq: Every day | ORAL | 2 refills | Status: DC

## 2021-07-04 NOTE — Progress Notes (Signed)
Virtual Visit  Note Due to COVID-19 pandemic this visit was conducted virtually. This visit type was conducted due to national recommendations for restrictions regarding the COVID-19 Pandemic (e.g. social distancing, sheltering in place) in an effort to limit this patient's exposure and mitigate transmission in our community. All issues noted in this document were discussed and addressed.  A physical exam was not performed with this format.  I connected with [name] on 33/82/50 at 11:45 by telephone and verified that I am speaking with the correct person using two identifiers. SHATERRIA SAGER is currently located at home and no ne is currently with her during visit. The provider, Mary-Margaret [name], FNP is located in their office at time of visit.  I discussed the limitations, risks, security and privacy concerns of performing an evaluation and management service by telephone and the availability of in person appointments. I also discussed with the patient that there may be a patient responsible charge related to this service. The patient expressed understanding and agreed to proceed.   History and Present Illness:  Patient states that she has been really depressed. She saw Dr. Warrick Parisian and he started her on vraylar. She says that is just making her sleepy. She had bad episode 2 weeks ago because and she tried slitting her wrist. She is not suicidal currently. She has referral for psych but has not heard anything.  Depression screen [facility] 2/9 07/04/2021 05/02/2021 02/09/2021  Decreased Interest 3 2 1   Down, Depressed, Hopeless 3 2 3   PHQ - 2 Score 6 4 4   Altered sleeping 3 3 3   Tired, decreased energy 3 3 3   Change in appetite 2 2 2   Feeling bad or failure about yourself  2 3 3   Trouble concentrating 3 2 3   Moving slowly or fidgety/restless 2 0 3  Suicidal thoughts 1 2 2   PHQ-9 Score 22 19 23   Difficult doing work/chores Somewhat difficult - Extremely dIfficult  Some recent  data might be hidden     Review of Systems  Psychiatric/Behavioral:  Positive for depression and suicidal ideas (at times). Negative for substance abuse.     Observations/Objective: Alert and oriented- answers all questions appropriately No distress   Assessment and Plan: [name] in today with chief complaint of No chief complaint on file.   1. Depression, recurrent ([place]) Start in seroquel at niht- stop trazadone Patient is to call about her referral to [facility] - QUEtiapine (SEROQUEL) 50 MG tablet; Take 1 tablet (50 mg total) by mouth at bedtime.  Dispense: 30 tablet; Refill: 2    Follow Up Instructions: 2-3 weeks    I discussed the assessment and treatment plan with the patient. The patient was provided an opportunity to ask questions and all were answered. The patient agreed with the plan and demonstrated an understanding of the instructions.   The patient was advised to call back or seek an in-person evaluation if the symptoms worsen or if the condition fails to improve as anticipated.  The above assessment and management plan was discussed with the patient. The patient verbalized understanding of and has agreed to the management plan. Patient is aware to call the clinic if symptoms persist or worsen. Patient is aware when to return to the clinic for a follow-up visit. Patient educated on when it is appropriate to go to the emergency department.   Time call ended:  11:57  I provided 12 minutes of  non face-to-face time during this encounter.  Mary-Margaret [name], FNP

## 2021-07-06 ENCOUNTER — Telehealth: Payer: Self-pay | Admitting: *Deleted

## 2021-07-06 NOTE — Telephone Encounter (Signed)
[name] Key: [id number] - PA Case ID: [id number] - Rx #: [id number] Need help? Call us at [phone] Outcome Approvedtoday PA Case: [id number], Status: Approved, Coverage Starts on: 07/06/2021 12:00:00 AM, Coverage Ends on: 07/06/2022 12:00:00 AM. Drug QUEtiapine Fumarate 50MG  tablets   [employer] and pt aware

## 2021-07-28 ENCOUNTER — Other Ambulatory Visit: Payer: Self-pay | Admitting: Obstetrics and Gynecology

## 2021-07-28 NOTE — Patient Outreach (Signed)
Care Coordination  09/22/5329  [name] [birth year] [id number]   Medicaid Managed Care   Unsuccessful Outreach Note  0/12/3866 Name: Jaime Flores MRN: [id number] DOB: 1986/06/25  Referred by: [name], Fransisca Kaufmann, MD Reason for referral : High Risk Managed Medicaid (Unsuccessful telephone outreach)   An unsuccessful telephone outreach was attempted today. The patient was referred to the case management team for assistance with care management and care coordination.   Follow Up Plan: The care management team will reach out to the patient again over the next 7 days.   Aida Raider RN, BSN [place]   [facility] - Managed Medicaid High Risk [phone].

## 2021-07-28 NOTE — Patient Instructions (Signed)
Hi Ms. [place], I am sorry we missed you today, I hope that you are doing well- as a part of your Medicaid benefit, you are eligible for care management and care coordination services at no cost or copay. I was unable to reach you by phone today but would be happy to help you with your health related needs. Please feel free to call me at [phone].  A member of the Managed Medicaid care management team will reach out to you again over the next 7 days.   Aida Raider RN, BSN [place]   [facility] - Managed Medicaid High Risk [phone].

## 2021-08-03 ENCOUNTER — Ambulatory Visit: Payer: Medicaid Other | Admitting: Family Medicine

## 2021-08-08 ENCOUNTER — Encounter: Payer: Self-pay | Admitting: Family Medicine

## 2021-08-16 ENCOUNTER — Telehealth: Payer: Self-pay | Admitting: *Deleted

## 2021-08-16 NOTE — Telephone Encounter (Signed)
Error - PA came in for rexulti - pt is not taking this med

## 2021-09-29 ENCOUNTER — Encounter: Payer: Self-pay | Admitting: Family Medicine

## 2021-09-29 ENCOUNTER — Telehealth (INDEPENDENT_AMBULATORY_CARE_PROVIDER_SITE_OTHER): Payer: Medicaid Other | Admitting: Family Medicine

## 2021-09-29 DIAGNOSIS — G4700 Insomnia, unspecified: Secondary | ICD-10-CM | POA: Diagnosis not present

## 2021-09-29 DIAGNOSIS — R5383 Other fatigue: Secondary | ICD-10-CM | POA: Diagnosis not present

## 2021-09-29 MED ORDER — TRAZODONE HCL 100 MG PO TABS: 100.0000 mg | ORAL_TABLET | ORAL | 3 refills | Status: DC | PRN

## 2021-09-29 NOTE — Progress Notes (Signed)
? ?Virtual Visit via mychart video Note ? ?I connected with Jaime Flores on 11/57/26 at 0905 by video and verified that I am speaking with the correct person using two identifiers. Jaime Flores is currently located at home and patient are currently with her during visit. The provider, Fransisca Kaufmann , MD is located in their office at time of visit. ? ?Call ended at [place] ? ?I discussed the limitations, risks, security and privacy concerns of performing an evaluation and management service by video and the availability of in person appointments. I also discussed with the patient that there may be a patient responsible charge related to this service. The patient expressed understanding and agreed to proceed. ? ? ?History and Present Illness: ?Patient is having trouble sleeping.  She working night shift for the past week.  She is not sleeping well and then she does get sleep some days and some nights she does not sleep at all. She still has trazodone and it is not helping. She is still taking citalopram but did not do good with quetiapine. She is doing ok with anxiety and depression is hit or miss. She has a lot of changes at work.  She still has depression but better. She doesn't know what keeps her from sleeping. She feels like she gets a 2nd burst of energy. She has tried sleeping music and dark room.  Her room is completely black. She has been on the night shift for 1 week but has been having difficulties for 2 weeks going to sleep. She is starting back Monday going back to day shifts.  ? ?1. Other fatigue   ?2. Insomnia, unspecified type   ? ? ?Outpatient Encounter Medications as of 09/29/2021  ?Medication Sig  ? citalopram (CELEXA) 40 MG tablet Take 1 tablet (40 mg total) by mouth daily.  ? ondansetron (ZOFRAN-ODT) 8 MG disintegrating tablet Take 1 tablet (8 mg total) by mouth every 8 (eight) hours as needed for nausea or vomiting.  ? promethazine-dextromethorphan (PROMETHAZINE-DM) 6.25-15 MG/5ML  syrup Take 5 mLs by mouth 4 (four) times daily as needed.  ? traZODone (DESYREL) 100 MG tablet Take 1-1.5 tablets (100-150 mg total) by mouth as needed for sleep.  ? [DISCONTINUED] oseltamivir (TAMIFLU) 75 MG capsule Take 1 capsule (75 mg total) by mouth every 12 (twelve) hours.  ? [DISCONTINUED] QUEtiapine (SEROQUEL) 50 MG tablet Take 1 tablet (50 mg total) by mouth at bedtime.  ? [DISCONTINUED] traZODone (DESYREL) 50 MG tablet Take 50 mg by mouth as needed for sleep.  ? ?No facility-administered encounter medications on file as of 09/29/2021.  ? ? ?Review of Systems  ?Constitutional:  Positive for fatigue. Negative for chills and fever.  ?Eyes:  Negative for visual disturbance.  ?Respiratory:  Negative for chest tightness and shortness of breath.   ?Cardiovascular:  Negative for chest pain and leg swelling.  ?Musculoskeletal:  Negative for back pain and gait problem.  ?Skin:  Negative for rash.  ?Neurological:  Negative for dizziness, light-headedness and headaches.  ?Psychiatric/Behavioral:  Positive for sleep disturbance. Negative for agitation, behavioral problems, self-injury and suicidal ideas.   ?All other systems reviewed and are negative. ? ?Observations/Objective: ?Patient sounds comfortable and in no acute distress ? ?Assessment and Plan: ?Problem List Items Addressed This Visit   ?None ?Visit Diagnoses   ? ? Other fatigue    -  Primary  ? Relevant Medications  ? traZODone (DESYREL) 100 MG tablet  ? Other Relevant Orders  ? CBC with Differential/Platelet  ?  CMP14+EGFR  ? TSH  ? Insomnia, unspecified type      ? Relevant Medications  ? traZODone (DESYREL) 100 MG tablet  ? Other Relevant Orders  ? CBC with Differential/Platelet  ? CMP14+EGFR  ? TSH  ? ?  ?  ?Will increase trazodone ?Follow up plan: ?Return if symptoms worsen or fail to improve. ? ? ?  ?I discussed the assessment and treatment plan with the patient. The patient was provided an opportunity to ask questions and all were answered. The patient  agreed with the plan and demonstrated an understanding of the instructions. ?  ?The patient was advised to call back or seek an in-person evaluation if the symptoms worsen or if the condition fails to improve as anticipated. ? ?The above assessment and management plan was discussed with the patient. The patient verbalized understanding of and has agreed to the management plan. Patient is aware to call the clinic if symptoms persist or worsen. Patient is aware when to return to the clinic for a follow-up visit. Patient educated on when it is appropriate to go to the emergency department.  ? ? ?I provided 11 minutes of non-face-to-face time during this encounter. ? ? ? ?[name], MD ?   ?

## 2021-10-20 ENCOUNTER — Ambulatory Visit
Admission: EM | Admit: 2021-10-20 | Discharge: 2021-10-20 | Disposition: A | Discharge status: Home / Self Care | Payer: Medicaid Other | Attending: Urgent Care | Admitting: Urgent Care

## 2021-10-20 DIAGNOSIS — F17200 Nicotine dependence, unspecified, uncomplicated: Secondary | ICD-10-CM

## 2021-10-20 DIAGNOSIS — J069 Acute upper respiratory infection, unspecified: Secondary | ICD-10-CM | POA: Diagnosis not present

## 2021-10-20 DIAGNOSIS — R070 Pain in throat: Secondary | ICD-10-CM | POA: Diagnosis not present

## 2021-10-20 DIAGNOSIS — J309 Allergic rhinitis, unspecified: Secondary | ICD-10-CM | POA: Diagnosis not present

## 2021-10-20 DIAGNOSIS — R0981 Nasal congestion: Secondary | ICD-10-CM

## 2021-10-20 DIAGNOSIS — H9203 Otalgia, bilateral: Secondary | ICD-10-CM | POA: Diagnosis not present

## 2021-10-20 LAB — POCT RAPID STREP A (OFFICE): Rapid Strep A Screen: NEGATIVE

## 2021-10-20 MED ORDER — PREDNISONE 20 MG PO TABS: ORAL_TABLET | ORAL | 0 refills | Status: DC

## 2021-10-20 MED ORDER — LEVOCETIRIZINE DIHYDROCHLORIDE 5 MG PO TABS
5.0000 mg | ORAL_TABLET | Freq: Every evening | ORAL | 0 refills | Status: DC
Start: 2021-10-20 — End: 2022-01-03

## 2021-10-20 MED ORDER — BENZONATATE 100 MG PO CAPS: 100.0000 mg | ORAL_CAPSULE | Freq: Three times a day (TID) | ORAL | 0 refills | Status: DC | PRN

## 2021-10-20 MED ORDER — AMOXICILLIN 875 MG PO TABS: 875.0000 mg | ORAL_TABLET | Freq: Two times a day (BID) | ORAL | 0 refills | Status: DC

## 2021-10-20 NOTE — ED Provider Notes (Signed)
?[name] ? ? ?MRN: [id number] DOB: 10-21-1985 ? ?Subjective:  ? ?Jaime Flores is a 36 y.o. female presenting for 4-day history of acute onset throat pain, painful swallowing, chills, body aches, runny and stuffy nose, coughing, bilateral ear pain.  Patient has a history of significant allergic rhinitis.  She is not taking any allergy medicine consistently.  No chest pain, shortness of breath or wheezing.  Patient is a smoker, does 1/2ppd and is working on quitting.  No history of asthma. ? ?No current facility-administered medications for this encounter. ? ?Current Outpatient Medications:  ?  citalopram (CELEXA) 40 MG tablet, Take 1 tablet (40 mg total) by mouth daily., Disp: 90 tablet, Rfl: 3 ?  ondansetron (ZOFRAN-ODT) 8 MG disintegrating tablet, Take 1 tablet (8 mg total) by mouth every 8 (eight) hours as needed for nausea or vomiting., Disp: 20 tablet, Rfl: 0 ?  promethazine-dextromethorphan (PROMETHAZINE-DM) 6.25-15 MG/5ML syrup, Take 5 mLs by mouth 4 (four) times daily as needed., Disp: 100 mL, Rfl: 0 ?  traZODone (DESYREL) 100 MG tablet, Take 1-1.5 tablets (100-150 mg total) by mouth as needed for sleep., Disp: 45 tablet, Rfl: 3  ? ?Allergies  ?Allergen Reactions  ? Catfish [Fish Allergy] Shortness Of Breath  ? Doxycycline Shortness Of Breath  ? Wellbutrin [Bupropion] Shortness Of Breath, Nausea Only and Palpitations  ? Tape Swelling  ?  Plastic tape please use paper  ? ? ?Past Medical History:  ?Diagnosis Date  ? Amenorrhea 01/27/2013  ? Angio-edema   ? Breast pain 03/10/2015  ? Encounter for menstrual regulation 07/05/2015  ? History of pseudoseizure   ? Irregular intermenstrual bleeding 07/28/2015  ? Irregular periods 07/05/2015  ? Screening for STD (sexually transmitted disease) 08/07/2013  ? Urticaria   ? UTI (urinary tract infection)   ? Vaginal discharge 06/10/2013  ?  ? ?Past Surgical History:  ?Procedure Laterality Date  ? APPENDECTOMY    ? CHOLECYSTECTOMY    ? TUBAL  LIGATION    ? ? ?Family History  ?Problem Relation Age of Onset  ? Autism Son   ? Diabetes Maternal Grandmother   ? Stroke Maternal Grandmother   ? Heart failure Maternal Grandmother   ? Stroke Maternal Grandfather   ? Stroke Father   ? Seizures Father   ? Allergies Father   ?     penicillin  ? Heart attack Father   ? Other Mother   ?     herpes  ? Hyperlipidemia Mother   ? Heart failure Paternal Grandfather   ? Diabetes Paternal Grandmother   ? Hypertension Paternal Grandmother   ? Autism Brother   ? Other Paternal Uncle   ?     heart exploded  ? Cancer Maternal Aunt   ? Diabetes Maternal Aunt   ? Hypertension Maternal Aunt   ? Immunodeficiency Neg Hx   ? Urticaria Neg Hx   ? Eczema Neg Hx   ? Atopy Neg Hx   ? Asthma Neg Hx   ? Angioedema Neg Hx   ? Allergic rhinitis Neg Hx   ? ? ?Social History  ? ?Tobacco Use  ? Smoking status: Every Day  ?  Packs/day: 0.50  ?  Types: Cigarettes  ? Smokeless tobacco: Never  ?Vaping Use  ? Vaping Use: Never used  ?Substance Use Topics  ? Alcohol use: No  ? Drug use: No  ? ? ?ROS ? ? ?Objective:  ? ?Vitals: ?BP 110/75 (BP Location: Right Arm)   Pulse 86  Temp 98.1 ?F (36.7 ?C) (Oral)   Resp 18   LMP 09/19/2021 (Exact Date)   SpO2 98%  ? ?Physical Exam ?Constitutional:   ?   General: She is not in acute distress. ?   Appearance: Normal appearance. She is well-developed and normal weight. She is not ill-appearing, toxic-appearing or diaphoretic.  ?HENT:  ?   Head: Normocephalic and atraumatic.  ?   Right Ear: Tympanic membrane, ear canal and external ear normal. No drainage, swelling or tenderness. No middle ear effusion. There is no impacted cerumen. Tympanic membrane is not erythematous.  ?   Left Ear: Tympanic membrane, ear canal and external ear normal. No drainage, swelling or tenderness.  No middle ear effusion. There is no impacted cerumen. Tympanic membrane is not erythematous.  ?   Nose: Congestion and rhinorrhea present.  ?   Mouth/Throat:  ?   Mouth: Mucous membranes  are moist. No oral lesions.  ?   Pharynx: No pharyngeal swelling, oropharyngeal exudate, posterior oropharyngeal erythema or uvula swelling.  ?   Tonsils: No tonsillar exudate or tonsillar abscesses.  ?   Comments: Thick streaks of postnasal drainage overlying pharynx. ?Eyes:  ?   General: No scleral icterus.    ?   Right eye: No discharge.     ?   Left eye: No discharge.  ?   Extraocular Movements: Extraocular movements intact.  ?   Right eye: Normal extraocular motion.  ?   Left eye: Normal extraocular motion.  ?   Conjunctiva/sclera: Conjunctivae normal.  ?Cardiovascular:  ?   Rate and Rhythm: Normal rate.  ?Pulmonary:  ?   Effort: Pulmonary effort is normal.  ?Musculoskeletal:  ?   Cervical back: Normal range of motion and neck supple.  ?Lymphadenopathy:  ?   Cervical: No cervical adenopathy.  ?Skin: ?   General: Skin is warm and dry.  ?Neurological:  ?   General: No focal deficit present.  ?   Mental Status: She is alert and oriented to person, place, and time.  ?Psychiatric:     ?   Mood and Affect: Mood normal.     ?   Behavior: Behavior normal.  ? ? ?Results for orders placed or performed during the hospital encounter of 10/20/21 (from the past 24 hour(s))  ?POCT rapid strep A     Status: None  ? Collection Time: 10/20/21  6:23 PM  ?Result Value Ref Range  ? Rapid Strep A Screen Negative Negative  ? ? ?Assessment and Plan :  ? ?PDMP not reviewed this encounter. ? ?1. Viral upper respiratory tract infection with cough   ?2. Nasal congestion   ?3. Throat pain   ?4. Acute ear pain, bilateral   ?5. Allergic rhinitis, unspecified seasonality, unspecified trigger   ?6. Smoker   ? ?In the context of her smoking, severe allergic rhinitis recommended an oral prednisone course.  Start Xyzal daily.  Use supportive care otherwise.  Throat culture pending.  In the event that she continues to have symptoms in the next 3 to 4 days I offered her an antibiotic course to address a secondary sinusitis.  Offered this as patient  will be traveling with her daughter this upcoming week. Counseled patient on potential for adverse effects with medications prescribed/recommended today, ER and return-to-clinic precautions discussed, patient verbalized understanding. ? ?  ?Jaynee Eagles, PA-C ?10/20/21 1834 ? ?

## 2021-10-20 NOTE — ED Triage Notes (Signed)
Pt reports body aches, chills and sore throat x 4 days.  ? ? ?

## 2021-10-23 LAB — CULTURE, GROUP A STREP (THRC)

## 2021-12-01 ENCOUNTER — Encounter: Payer: Self-pay | Admitting: Nurse Practitioner

## 2021-12-01 ENCOUNTER — Ambulatory Visit (INDEPENDENT_AMBULATORY_CARE_PROVIDER_SITE_OTHER): Payer: Medicaid Other | Admitting: Nurse Practitioner

## 2021-12-01 ENCOUNTER — Encounter: Payer: Self-pay | Admitting: Family Medicine

## 2021-12-01 ENCOUNTER — Telehealth: Payer: Self-pay | Admitting: Family Medicine

## 2021-12-01 DIAGNOSIS — H109 Unspecified conjunctivitis: Secondary | ICD-10-CM | POA: Diagnosis not present

## 2021-12-01 MED ORDER — BACITRACIN-POLYMYXIN B 500-10000 UNIT/GM OP OINT: 1.0000 "application " | TOPICAL_OINTMENT | Freq: Two times a day (BID) | OPHTHALMIC | 0 refills | Status: DC

## 2021-12-01 NOTE — Progress Notes (Signed)
? ?  Virtual Visit  Note ?Due to COVID-19 pandemic this visit was conducted virtually. This visit type was conducted due to national recommendations for restrictions regarding the COVID-19 Pandemic (e.g. social distancing, sheltering in place) in an effort to limit this patient's exposure and mitigate transmission in our community. All issues noted in this document were discussed and addressed.  A physical exam was not performed with this format. ? ?I connected with Jaime Flores on 57/32/20 at 9:50 am  by telephone and verified that I am speaking with the correct person using two identifiers. Jaime Flores is currently located at home during visit. The provider, Ivy Lynn, NP is located in their office at time of visit. ? ?I discussed the limitations, risks, security and privacy concerns of performing an evaluation and management service by telephone and the availability of in person appointments. I also discussed with the patient that there may be a patient responsible charge related to this service. The patient expressed understanding and agreed to proceed. ? ? ?History and Present Illness: ? ?HPI ? ? ? ?Review of Systems  ?Constitutional: Negative.  Negative for chills and fever.  ?HENT: Negative.    ?Eyes:  Positive for discharge. Negative for pain.  ?Respiratory: Negative.    ?Cardiovascular: Negative.   ?Skin: Negative.  Negative for rash.  ?All other systems reviewed and are negative. ? ? ?Observations/Objective: ?Tele-visit patient not in distress ? ?Assessment and Plan: ? ?-Patient presents with conjunctivitis.  ?-Polysporin eye drops twice daily ?-Clean warm wash cloth  ?-clean hands before touching eyes ?-Tylenol/ibuprofen for pain as needed ? ?Follow Up Instructions: ?Follow up with worsening unresolved symptoms  ? ?  ?I discussed the assessment and treatment plan with the patient. The patient was provided an opportunity to ask questions and all were answered. The patient agreed with the  plan and demonstrated an understanding of the instructions. ?  ?The patient was advised to call back or seek an in-person evaluation if the symptoms worsen or if the condition fails to improve as anticipated. ? ?The above assessment and management plan was discussed with the patient. The patient verbalized understanding of and has agreed to the management plan. Patient is aware to call the clinic if symptoms persist or worsen. Patient is aware when to return to the clinic for a follow-up visit. Patient educated on when it is appropriate to go to the emergency department.  ? ?Time call ended:  10:01 am  ? ?I provided 11 minutes of  non face-to-face time during this encounter. ? ? ? ?Ivy Lynn, NP ?  ?

## 2021-12-01 NOTE — Patient Instructions (Signed)
Bacterial Conjunctivitis, Adult ?Bacterial conjunctivitis is an infection of the clear membrane that covers the white part of the eye and the inner surface of the eyelid (conjunctiva). When the blood vessels in the conjunctiva become inflamed, the eye becomes red or pink. The eye often feels irritated or itchy. Bacterial conjunctivitis spreads easily from person to person (is contagious). It also spreads easily from one eye to the other eye. ?What are the causes? ?This condition is caused by bacteria. You may get the infection if you come into close contact with: ?A person who is infected with the bacteria. ?Items that are contaminated with the bacteria, such as a face towel, contact lens solution, or eye makeup. ?What increases the risk? ?You are more likely to develop this condition if: ?You are exposed to other people who have the infection. ?You wear contact lenses. ?You have a sinus infection. ?You have had a recent eye injury or surgery. ?You have a weak body defense system (immune system). ?You have a medical condition that causes dry eyes. ?What are the signs or symptoms? ?Symptoms of this condition include: ?Thick, yellowish discharge from the eye. This may turn into a crust on the eyelid overnight and cause your eyelids to stick together. ?Tearing or watery eyes. ?Itchy eyes. ?Burning feeling in your eyes. ?Eye redness. ?Swollen eyelids. ?Blurred vision. ?How is this diagnosed? ?This condition is diagnosed based on your symptoms and medical history. Your health care provider may also take a sample of discharge from your eye to find the cause of your infection. ?How is this treated? ?This condition may be treated with: ?Antibiotic eye drops or ointment to clear the infection more quickly and prevent the spread of infection to others. ?Antibiotic medicines taken by mouth (orally) to treat infections that do not respond to drops or ointments or that last longer than 10 days. ?Cool, wet cloths (cool  compresses) placed on the eyes. ?Artificial tears applied 2-6 times a day. ?Follow these instructions at home: ?Medicines ?Take or apply your antibiotic medicine as told by your health care provider. Do not stop using the antibiotic, even if your condition improves, unless directed by your health care provider. ?Take or apply over-the-counter and prescription medicines only as told by your health care provider. ?Be very careful to avoid touching the edge of your eyelid with the eye-drop bottle or the ointment tube when you apply medicines to the affected eye. This will keep you from spreading the infection to your other eye or to other people. ?Managing discomfort ?Gently wipe away any drainage from your eye with a warm, wet washcloth or a cotton ball. ?Apply a clean, cool compress to your eye for 10-20 minutes, 3-4 times a day. ?General instructions ?Do not wear contact lenses until the inflammation is gone and your health care provider says it is safe to wear them again. Ask your health care provider how to sterilize or replace your contact lenses before you use them again. Wear glasses until you can resume wearing contact lenses. ?Avoid wearing eye makeup until the inflammation is gone. Throw away any old eye cosmetics that may be contaminated. ?Change or wash your pillowcase every day. ?Do not share towels or washcloths. This may spread the infection. ?Wash your hands often with soap and water for at least 20 seconds and especially before touching your face or eyes. Use paper towels to dry your hands. ?Avoid touching or rubbing your eyes. ?Do not drive or use heavy machinery if your vision is blurred. ?Contact   a health care provider if: ?You have a fever. ?Your symptoms do not get better after 10 days. ?Get help right away if: ?You have a fever and your symptoms suddenly get worse. ?You have severe pain when you move your eye. ?You have facial pain, redness, or swelling. ?You have a sudden loss of  vision. ?Summary ?Bacterial conjunctivitis is an infection of the clear membrane that covers the white part of the eye and the inner surface of the eyelid (conjunctiva). ?Bacterial conjunctivitis spreads easily from eye to eye and from person to person (is contagious). ?Wash your hands often with soap and water for at least 20 seconds and especially before touching your face or eyes. Use paper towels to dry your hands. ?Take or apply your antibiotic medicine as told by your health care provider. Do not stop using the antibiotic even if your condition improves. ?Contact a health care provider if you have a fever or if your symptoms do not get better after 10 days. Get help right away if you have a sudden loss of vision. ?This information is not intended to replace advice given to you by your health care provider. Make sure you discuss any questions you have with your health care provider. ?Document Revised: 10/19/2020 Document Reviewed: 10/19/2020 ?Elsevier Patient Education ? 2023 [employer] ? ?

## 2021-12-01 NOTE — Telephone Encounter (Signed)
Appt scheduled per patients request ?

## 2021-12-08 ENCOUNTER — Ambulatory Visit (INDEPENDENT_AMBULATORY_CARE_PROVIDER_SITE_OTHER): Payer: Medicaid Other | Admitting: Family Medicine

## 2021-12-08 ENCOUNTER — Telehealth: Payer: Medicaid Other | Admitting: Nurse Practitioner

## 2021-12-08 ENCOUNTER — Encounter: Payer: Self-pay | Admitting: Family Medicine

## 2021-12-08 DIAGNOSIS — F419 Anxiety disorder, unspecified: Secondary | ICD-10-CM

## 2021-12-08 DIAGNOSIS — Q279 Congenital malformation of peripheral vascular system, unspecified: Secondary | ICD-10-CM

## 2021-12-08 DIAGNOSIS — F339 Major depressive disorder, recurrent, unspecified: Secondary | ICD-10-CM

## 2021-12-08 MED ORDER — SERTRALINE HCL 50 MG PO TABS
50.0000 mg | ORAL_TABLET | Freq: Every day | ORAL | 3 refills | Status: DC
Start: 2021-12-08 — End: 2022-05-03

## 2021-12-08 NOTE — Progress Notes (Signed)
Virtual Visit via telephone Note  I connected with Jaime Flores on [birth year] at 1503 by telephone and verified that I am speaking with the correct person using two identifiers. Jaime Flores is currently located at [facility] and patient are currently with her during visit. The provider, Fransisca Kaufmann Golden Emile, MD is located in their office at time of visit.  Call ended at 1517  I discussed the limitations, risks, security and privacy concerns of performing an evaluation and management service by telephone and the availability of in person appointments. I also discussed with the patient that there may be a patient responsible charge related to this service. The patient expressed understanding and agreed to proceed.   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 She has some after stress but sometimes they are at work and there is no reason for them .  She has been having them and she is concerned about the affect on her speech during the pseudoseizures. She felt like she could not get the words out that she needed.  She had a 2nd one that wasn't as bad and the speech was 15 -20 minutes and then went back to normal she has multiple seizures for the past 2 months.  She has been off the Celexa for 2 months because she ran. She started increasing seizures while she was on the Celexa but has worsened since she has been off the medicine.  She has seen neurology in the past.  She was having too much work and is not having break.   1. Anxiety   2. Depression, recurrent ([place])   3. Venous anomaly     Outpatient Encounter Medications as of 12/08/2021  Medication Sig   sertraline (ZOLOFT) 50 MG tablet Take 1 tablet (50 mg total) by mouth daily.   levocetirizine (XYZAL) 5 MG tablet Take 1 tablet (5 mg total) by mouth every evening.   traZODone (DESYREL) 100 MG tablet Take 1-1.5 tablets (100-150 mg total) by mouth as needed for sleep.   [DISCONTINUED]  amoxicillin (AMOXIL) 875 MG tablet Take 1 tablet (875 mg total) by mouth 2 (two) times daily.   [DISCONTINUED] bacitracin-polymyxin b (POLYSPORIN) ophthalmic ointment Place 1 application. into both eyes 2 (two) times daily. apply to eye every 12 hours while awake   [DISCONTINUED] benzonatate (TESSALON) 100 MG capsule Take 1-2 capsules (100-200 mg total) by mouth 3 (three) times daily as needed for cough.   [DISCONTINUED] citalopram (CELEXA) 40 MG tablet Take 1 tablet (40 mg total) by mouth daily.   [DISCONTINUED] ondansetron (ZOFRAN-ODT) 8 MG disintegrating tablet Take 1 tablet (8 mg total) by mouth every 8 (eight) hours as needed for nausea or vomiting.   [DISCONTINUED] predniSONE (DELTASONE) 20 MG tablet Take 2 tablets daily with breakfast.   No facility-administered encounter medications on file as of 12/08/2021.    Review of Systems  Constitutional:  Negative for chills and fever.  Eyes:  Negative for visual disturbance.  Respiratory:  Negative for chest tightness and shortness of breath.   Cardiovascular:  Negative for chest pain and leg swelling.  Musculoskeletal:  Negative for back pain and gait problem.  Skin:  Negative for rash.  Neurological:  Negative for dizziness, light-headedness and headaches.  Psychiatric/Behavioral:  Positive for dysphoric mood. Negative for agitation, behavioral problems, self-injury, sleep disturbance and suicidal ideas. The patient is nervous/anxious.   All other systems reviewed and are negative.  Observations/Objective: Patient sounds comfortable and in no  acute distress  Assessment and Plan: Problem List Items Addressed This Visit       Other   Anxiety - Primary   Relevant Medications   sertraline (ZOLOFT) 50 MG tablet   Other Relevant Orders   CBC with Differential/Platelet   CMP14+EGFR   Lipid panel   TSH   VITAMIN D 25 Hydroxy (Vit-D Deficiency, Fractures)   Depression, recurrent (HCC)   Relevant Medications   sertraline (ZOLOFT) 50 MG  tablet   Other Relevant Orders   CBC with Differential/Platelet   CMP14+EGFR   Lipid panel   TSH   VITAMIN D 25 Hydroxy (Vit-D Deficiency, Fractures)   Other Visit Diagnoses     Venous anomaly       Relevant Orders   Ambulatory referral to Neurology       Will start zoloft for anxiety and depression and refer to neurology for new neurologic symptoms Follow up plan: Return in about 4 weeks (around 01/05/2022), or if symptoms worsen or fail to improve, for depression and anxiety.     I discussed the assessment and treatment plan with the patient. The patient was provided an opportunity to ask questions and all were answered. The patient agreed with the plan and demonstrated an understanding of the instructions.   The patient was advised to call back or seek an in-person evaluation if the symptoms worsen or if the condition fails to improve as anticipated.  The above assessment and management plan was discussed with the patient. The patient verbalized understanding of and has agreed to the management plan. Patient is aware to call the clinic if symptoms persist or worsen. Patient is aware when to return to the clinic for a follow-up visit. Patient educated on when it is appropriate to go to the emergency department.    I provided 14 minutes of non-face-to-face time during this encounter.    [name], MD

## 2021-12-12 ENCOUNTER — Encounter: Payer: Self-pay | Admitting: Neurology

## 2021-12-13 ENCOUNTER — Encounter: Payer: Self-pay | Admitting: Family Medicine

## 2022-01-03 ENCOUNTER — Encounter: Payer: Self-pay | Admitting: Family Medicine

## 2022-01-03 ENCOUNTER — Ambulatory Visit (INDEPENDENT_AMBULATORY_CARE_PROVIDER_SITE_OTHER): Payer: Medicaid Other | Admitting: Family Medicine

## 2022-01-03 VITALS — BP 112/75 | HR 71 | Temp 97.2°F | Ht 62.0 in | Wt 147.0 lb

## 2022-01-03 DIAGNOSIS — F419 Anxiety disorder, unspecified: Secondary | ICD-10-CM | POA: Diagnosis not present

## 2022-01-03 DIAGNOSIS — G4700 Insomnia, unspecified: Secondary | ICD-10-CM

## 2022-01-03 DIAGNOSIS — F339 Major depressive disorder, recurrent, unspecified: Secondary | ICD-10-CM

## 2022-01-03 DIAGNOSIS — R5383 Other fatigue: Secondary | ICD-10-CM | POA: Diagnosis not present

## 2022-01-03 MED ORDER — TRAZODONE HCL 100 MG PO TABS: 100.0000 mg | ORAL_TABLET | ORAL | 3 refills | Status: DC | PRN

## 2022-01-03 NOTE — Progress Notes (Signed)
BP 112/75   Pulse 71   Temp (!) 97.2 F (36.2 C)   Ht '5\' 2"'$  (1.575 m)   Wt 147 lb (66.7 kg)   SpO2 98%   BMI 26.89 kg/m    Subjective:   Patient ID: Jaime Flores, female    DOB: 01/15/1986, 36 y.o.   MRN: [id number]  HPI: Jaime Flores is a 36 y.o. female presenting on 01/03/2022 for Medical Management of Chronic Issues and Anxiety   HPI Depression and anxiety Patient is coming in for recheck for depression and anxiety.  She is currently taking Zoloft but is forgetting to take it sometimes and then has trazodone but has not been taking it as much because she was concerned about the interaction.  She is not doing as well but she admits she is not hardly taking the Zoloft at all and has not taken trazodone and having more anxiety and panic attacks and pseudoseizures.  She is even having sometimes at night she will wake up with them.  She denies any suicidal ideations or thoughts of hurting herself.   Relevant past medical, surgical, family and social history reviewed and updated as indicated. Interim medical history since our last visit reviewed. Allergies and medications reviewed and updated.  Review of Systems  Constitutional:  Negative for chills and fever.  Eyes:  Negative for visual disturbance.  Respiratory:  Negative for chest tightness and shortness of breath.   Cardiovascular:  Negative for chest pain and leg swelling.  Skin:  Negative for rash.  Neurological:  Negative for dizziness, light-headedness and headaches.  Psychiatric/Behavioral:  Positive for dysphoric mood and sleep disturbance. Negative for agitation, behavioral problems, self-injury and suicidal ideas. The patient is nervous/anxious.   All other systems reviewed and are negative.   Per HPI unless specifically indicated above   Allergies as of 01/03/2022       Reactions   Catfish [fish Allergy] Shortness Of Breath   Doxycycline Shortness Of Breath   Wellbutrin [bupropion] Shortness Of Breath,  Nausea Only, Palpitations   Tape Swelling   Plastic tape please use paper        Medication List        Accurate as of January 03, 2022 10:42 AM. If you have any questions, ask your nurse or doctor.          STOP taking these medications    levocetirizine 5 MG tablet Commonly known as: XYZAL Stopped by: Fransisca Kaufmann [place] Lohr, MD       TAKE these medications    sertraline 50 MG tablet Commonly known as: ZOLOFT Take 1 tablet (50 mg total) by mouth daily.   traZODone 100 MG tablet Commonly known as: DESYREL Take 1-1.5 tablets (100-150 mg total) by mouth as needed for sleep.         Objective:   BP 112/75   Pulse 71   Temp (!) 97.2 F (36.2 C)   Ht '5\' 2"'$  (1.575 m)   Wt 147 lb (66.7 kg)   SpO2 98%   BMI 26.89 kg/m   Wt Readings from Last 3 Encounters:  01/03/22 147 lb (66.7 kg)  05/31/21 163 lb (73.9 kg)  05/02/21 163 lb (73.9 kg)    Physical Exam Vitals and nursing note reviewed.  Constitutional:      General: She is not in acute distress.    Appearance: She is well-developed. She is not diaphoretic.  Eyes:     Conjunctiva/sclera: Conjunctivae normal.  Cardiovascular:  Rate and Rhythm: Normal rate and regular rhythm.     Heart sounds: Normal heart sounds. No murmur heard. Pulmonary:     Effort: Pulmonary effort is normal. No respiratory distress.     Breath sounds: Normal breath sounds. No wheezing.  Skin:    General: Skin is warm and dry.     Findings: No rash.  Neurological:     Mental Status: She is alert and oriented to person, place, and time.     Coordination: Coordination normal.  Psychiatric:        Mood and Affect: Mood is anxious and depressed.        Behavior: Behavior normal.        Thought Content: Thought content does not include suicidal ideation. Thought content does not include suicidal plan.       Assessment & Plan:   Problem List Items Addressed This Visit       Other   Anxiety - Primary   Relevant Medications    traZODone (DESYREL) 100 MG tablet   Depression, recurrent (HCC)   Relevant Medications   traZODone (DESYREL) 100 MG tablet   Other Visit Diagnoses     Other fatigue       Relevant Medications   traZODone (DESYREL) 100 MG tablet   Insomnia, unspecified type       Relevant Medications   traZODone (DESYREL) 100 MG tablet       She will move her Zoloft to the nighttime because it will be easier for her to remember and she will take it with the trazodone.  Encouraged her that there is a very slight interaction but she still can use the 2 together and then we can see her back in a month and I do believe that controlling her anxiety will help her memory issues that she is having. Follow up plan: Return in about 4 weeks (around 01/31/2022), or if symptoms worsen or fail to improve, for Recheck depression and anxiety.  Counseling provided for all of the vaccine components No orders of the defined types were placed in this encounter.   Caryl Pina, MD [facility] 01/03/2022, 10:42 AM

## 2022-01-04 ENCOUNTER — Ambulatory Visit (INDEPENDENT_AMBULATORY_CARE_PROVIDER_SITE_OTHER): Payer: Medicaid Other | Admitting: Neurology

## 2022-01-04 ENCOUNTER — Encounter: Payer: Self-pay | Admitting: Neurology

## 2022-01-04 VITALS — BP 110/73 | HR 71 | Ht 62.0 in | Wt 146.6 lb

## 2022-01-04 DIAGNOSIS — F445 Conversion disorder with seizures or convulsions: Secondary | ICD-10-CM | POA: Diagnosis not present

## 2022-01-04 NOTE — Progress Notes (Signed)
NEUROLOGY CONSULTATION NOTE  [name] MRN: [id number] DOB: [birth year]  Referring provider: Dr. Vonna Kotyk [name] Primary care provider: Dr. 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 Although her history is well known to you, please allow me to reiterate it for the purpose of our medical record. She is accompanied by her spouse Jaime Flores who helps supplement the history today. Records and images were personally reviewed where available.   HISTORY OF PRESENT ILLNESS: This is a 36 year old right-handed woman with a history of anxiety, depression, psychogenic non-epileptic events presenting for second opinion. 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 There was an increase in episodes when she was off Celexa. Symptoms started in January 2023, she was in the ER on 34/13/22 for an episode of loss of consciousness, she reported feeling a burning sensation in the back of her head, then lost consciousness. She was back in the ER on 08/17/20 for several episodes of shaking that were witnessed in the ER with no post-event confusion, she was noted to come around with ammonia smelling salts immediately. She was evaluated by neurologist Dr. Krista Blue in 08/2020, she reviewed videos of episodes described as eyes closed, lying down, body relaxed. In one video, she appeared asleep with right leg rotating movement, on another video she was on her left side with left hand shaking for a few minutes, semiology consistent with non-epileptic events. She had a normal wake EEG in 08/2020. 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 She was in the ER in 02/23/21 after she zoned out and began shaking in arms and head for 30 seconds, then again on 02/26/21 when she had one in the shower and hit her head. She states that episodes usually start with a burning in her legs, usually on the right then moving to the left. Sometimes there is tingling in both arms before she gets the burning feeling. She feels like Jello, like she does not have control over her body. She feels very tired and sore afterward. She reports the longest event-free period was 2 months, at one point she had 15 in one day. These have quieted down but recently started increasing again, last night she had 3. He reports she may go a day without having one, then would have them back to back. She has new symptoms recently where she can't talk afterwards, she has it in her brain but they come out jumbled. She reports that when she was at her prior job, she would make a breathing hiccup noise during the episodes, she was unaware of it. Initially she could hear what was going on, she could hear herself hit the floor but not feel it. Recently she is unaware of her surroundings unless she is touched and spoken to, then she can start to hear. She notes that stress is a trigger, last night she had an argument with her mother right before the episodes. When she was at her prior job at [employer], "they were radical," she  has been at her current job for almost a year in August, she works alone at a drive-thru station and her husband has been called that she had an event. She denies any olfactory/gustatory hallucinations, deja vu, rising epigastric sensation, focal numbness/tingling/weakness. Her husband has noticed brief jerks which she is unaware of, he reports she was talking to our receptionist and having jerking. As she came into the exam room, the jerks increased and he  lay her on the examination table. As I entered the room, she was lying on her left side with eyes closed, having irregular asynchronous jerking movements that lasted a few more seconds. No post-event confusion, she was able to speak without issues.   She used to have headaches in 2017 but these resolved. She does not have headaches in general but has one today attributed to hitting her head from the episode last night. She gets dizzy when she first wakes up, it slowly gets better. No diplopia, dysarthria/dysphagia, neck pain, bowel/bladder dysfunction. She reports her father had seizures. Her twin sister's youngest child has febrile seizures. She had a concussion at age 21 and lost her childhood memories. She had a normal birth and early development.  There is no history of febrile convulsions, CNS infections such as meningitis/encephalitis, significant traumatic brain injury, neurosurgical procedures.   PAST MEDICAL HISTORY: Past Medical History:  Diagnosis Date   Amenorrhea 01/27/2013   Angio-edema    Breast pain 03/10/2015   Encounter for menstrual regulation 07/05/2015   History of pseudoseizure    Irregular intermenstrual bleeding 07/28/2015   Irregular periods 07/05/2015   Screening for STD (sexually transmitted disease) 08/07/2013   Urticaria    UTI (urinary tract infection)    Vaginal discharge 06/10/2013    PAST SURGICAL HISTORY: Past Surgical History:  Procedure Laterality Date   APPENDECTOMY     CHOLECYSTECTOMY     TUBAL LIGATION      MEDICATIONS: Current Outpatient Medications on File Prior to Visit  Medication Sig Dispense Refill   sertraline (ZOLOFT) 50 MG tablet Take 1 tablet (50 mg total) by mouth daily. 30 tablet 3   traZODone (DESYREL) 100 MG tablet Take 1-1.5 tablets (100-150 mg total) by mouth as needed for sleep. 45 tablet 3   No current facility-administered medications on file prior to visit.    ALLERGIES: Allergies  Allergen Reactions   Catfish [Fish  Allergy] Shortness Of Breath   Doxycycline Shortness Of Breath   Wellbutrin [Bupropion] Shortness Of Breath, Nausea Only and Palpitations   Tape Swelling    Plastic tape please use paper    FAMILY HISTORY: Family History  Problem Relation Age of Onset   Autism Son    Diabetes Maternal Grandmother    Stroke Maternal Grandmother    Heart failure Maternal Grandmother    Stroke Maternal Grandfather    Stroke Father    Seizures Father    Allergies Father        penicillin   Heart attack Father    Other Mother        herpes   Hyperlipidemia Mother    Heart failure Paternal Grandfather    Diabetes Paternal Grandmother    Hypertension Paternal Grandmother    Autism Brother    Other Paternal Uncle        heart exploded   Cancer Maternal Aunt    Diabetes Maternal Aunt    Hypertension Maternal Aunt    Immunodeficiency Neg Hx    Urticaria Neg Hx    Eczema  Neg Hx    Atopy Neg Hx    Asthma Neg Hx    Angioedema Neg Hx    Allergic rhinitis Neg Hx     SOCIAL HISTORY: Social History   Socioeconomic History   Marital status: Single    Spouse name: Not on file   Number of children: 2   Years of education: 12   Highest education level: High school graduate  Occupational History   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    Right-handed.   Caffeine use: 0.5 - 0.75 liters of soda per day, two cups Mocha Frappe on weekdays   Social Determinants of Health   Financial Resource Strain: Not on file  Food Insecurity: Not on file  Transportation Needs: Not on file  Physical  Activity: Not on file  Stress: Not on file  Social Connections: Not on file  Intimate Partner Violence: Not on file     PHYSICAL EXAM: Vitals:   01/04/22 1024  BP: 110/73  Pulse: 71  SpO2: 98%   General: No acute distress. At the beginning of the visit, she was having a typical episode on the examination table with eyes closed, irregular asynchronous jerking movements that lasted a few seconds. Head:  Normocephalic/atraumatic Skin/Extremities: No rash, no edema Neurological Exam: Mental status: alert and awake, no dysarthria or aphasia, Fund of knowledge is appropriate.  Attention and concentration are normal.     Cranial nerves: CN I: not tested CN II: pupils equal, round and reactive to light, visual fields intact CN III, IV, VI:  full range of motion, no nystagmus, no ptosis CN V: report of patchy sensory changes on face CN VII: upper and lower face symmetric CN VIII: hearing intact to conversation Bulk & Tone: normal, no fasciculations. Motor: 5/5 throughout with no pronator drift. Sensation: report of patchy sensory changes in UE and LE that do not fit any distribution.  Deep Tendon Reflexes: brisk +2 throughout, no ankle clonus, negative Hoffman sign, no hyperactive pectoralis reflex noted Plantar responses: downgoing bilaterally Cerebellar: no incoordination on finger to nose testing Gait: narrow-based and steady, able to tandem walk adequately. Tremor: none   IMPRESSION: This is a 36 year old right-handed woman with a history of anxiety, depression, psychogenic non-epileptic events presenting for second opinion. She had a witnessed episode in the office today consistent with a psychogenic non-epileptic shaking episode (PNES). We discussed normal brain MRI and EEG. We had an extensive discussion about PNES, we discussed that one of the risk factors is a history of past trauma and she became tearful admitting to this history but did not elaborate. Discussed how CBT and  treatment of anxiety/depression is the management of PNES, resources for CBT and PNES provided today. [place] driving laws were discussed with the patient, and she knows to stop driving after an episode of loss of awareness until 6 months event-free. Follow-up in 6 months, call for any changes.    Thank you for allowing me to participate in the care  of this patient. Please do not hesitate to call for any questions or concerns.   Ellouise Newer, M.D.  CC: Dr. Warrick Parisian

## 2022-01-04 NOTE — Patient Instructions (Signed)
Good to meet you. Continue working on anxiety and depression treatment, and please start Cognitive Behavioral Therapy. Follow-up in 6 months, call for any changes.

## 2022-01-21 ENCOUNTER — Encounter: Payer: Self-pay | Admitting: Family Medicine

## 2022-01-22 ENCOUNTER — Encounter: Payer: Self-pay | Admitting: Family Medicine

## 2022-01-22 ENCOUNTER — Ambulatory Visit (INDEPENDENT_AMBULATORY_CARE_PROVIDER_SITE_OTHER): Payer: Medicaid Other | Admitting: Family Medicine

## 2022-01-22 DIAGNOSIS — F419 Anxiety disorder, unspecified: Secondary | ICD-10-CM

## 2022-01-22 DIAGNOSIS — F339 Major depressive disorder, recurrent, unspecified: Secondary | ICD-10-CM | POA: Diagnosis not present

## 2022-01-22 NOTE — Progress Notes (Signed)
Virtual Visit via telephone Note  I connected with Jaime Flores on [birth year] at 1233 by telephone and verified that I am speaking with the correct person using two identifiers. Jaime Flores is currently located at home and mother are currently with her during visit. The provider, Fransisca Kaufmann Arlee Santosuosso, MD is located in their office at time of visit.  Call ended at 1244  I discussed the limitations, risks, security and privacy concerns of performing an evaluation and management service by telephone and the availability of in person appointments. I also discussed with the patient that there may be a patient responsible charge related to this service. The patient expressed understanding and agreed to proceed.   History and Present Illness: Anxiety and depression Patient is calling in for recheck for Zoloft.  She is feeling better when she remembers to take it.  She did switch it to night time and is doing better at taking it more consistently. She is having some irritability.  Her mom sued her husband and he is not living with them and he is homeless. Her mom says that he paid rent late. There is some issues between her mother and her husband.  She denies suicidal ideations.  She has appetite.  They are having some financial issues as well.  They had to get her husbands kids because they were taken from their mother.   1. Anxiety   2. Depression, recurrent [facility]     Outpatient Encounter Medications as of 01/22/2022  Medication Sig   sertraline (ZOLOFT) 50 MG tablet Take 1 tablet (50 mg total) by mouth daily.   traZODone (DESYREL) 100 MG tablet Take 1-1.5 tablets (100-150 mg total) by mouth as needed for sleep.   No facility-administered encounter medications on file as of 01/22/2022.    Review of Systems  Constitutional:  Negative for chills and fever.  Eyes:  Negative for visual disturbance.  Respiratory:  Negative for chest tightness and shortness of breath.   Cardiovascular:   Negative for chest pain and leg swelling.  Skin:  Negative for rash.  Neurological:  Negative for dizziness, light-headedness and headaches.  Psychiatric/Behavioral:  Positive for dysphoric mood. Negative for agitation, behavioral problems, self-injury, sleep disturbance and suicidal ideas. The patient is nervous/anxious.   All other systems reviewed and are negative.   Observations/Objective: Patient sounds comfortable and in no acute distress.   Assessment and Plan: Problem List Items Addressed This Visit       Other   Anxiety - Primary   Depression, recurrent ([place])    Continue zoloft and take more consistently and recheck in 1-2 months.  Follow up plan: Return if symptoms worsen or fail to improve, for 1-2 month anxiety and depression.     I discussed the assessment and treatment plan with the patient. The patient was provided an opportunity to ask questions and all were answered. The patient agreed with the plan and demonstrated an understanding of the instructions.   The patient was advised to call back or seek an in-person evaluation if the symptoms worsen or if the condition fails to improve as anticipated.  The above assessment and management plan was discussed with the patient. The patient verbalized understanding of and has agreed to the management plan. Patient is aware to call the clinic if symptoms persist or worsen. Patient is aware when to return to the clinic for a follow-up visit. Patient educated on when it is appropriate to go to the emergency department.  I provided 11 minutes of non-face-to-face time during this encounter.    [name], MD

## 2022-03-11 ENCOUNTER — Ambulatory Visit
Admission: EM | Admit: 2022-03-11 | Discharge: 2022-03-11 | Disposition: A | Discharge status: Home / Self Care | Payer: Medicaid Other | Attending: Nurse Practitioner | Admitting: Nurse Practitioner

## 2022-03-11 DIAGNOSIS — J029 Acute pharyngitis, unspecified: Secondary | ICD-10-CM

## 2022-03-11 DIAGNOSIS — U071 COVID-19: Secondary | ICD-10-CM | POA: Insufficient documentation

## 2022-03-11 DIAGNOSIS — R059 Cough, unspecified: Secondary | ICD-10-CM | POA: Insufficient documentation

## 2022-03-11 DIAGNOSIS — Z79899 Other long term (current) drug therapy: Secondary | ICD-10-CM | POA: Diagnosis not present

## 2022-03-11 DIAGNOSIS — R0989 Other specified symptoms and signs involving the circulatory and respiratory systems: Secondary | ICD-10-CM | POA: Insufficient documentation

## 2022-03-11 LAB — POCT RAPID STREP A (OFFICE): Rapid Strep A Screen: NEGATIVE

## 2022-03-11 MED ORDER — PSEUDOEPH-BROMPHEN-DM 30-2-10 MG/5ML PO SYRP: 5.0000 mL | ORAL_SOLUTION | Freq: Four times a day (QID) | ORAL | 0 refills | Status: DC | PRN

## 2022-03-11 MED ORDER — CETIRIZINE HCL 10 MG PO TABS: 10.0000 mg | ORAL_TABLET | Freq: Every day | ORAL | 0 refills | Status: DC

## 2022-03-11 MED ORDER — FLUTICASONE PROPIONATE 50 MCG/ACT NA SUSP
2.0000 | Freq: Every day | NASAL | 0 refills | Status: DC
Start: 2022-03-11 — End: 2022-07-10

## 2022-03-11 NOTE — ED Notes (Signed)
Pt did not answer the phone

## 2022-03-11 NOTE — ED Provider Notes (Signed)
[facility]    CSN: [id number] Arrival date & time: 03/11/22  5701      History   Chief Complaint No chief complaint on file.   HPI Jaime Flores is a 36 y.o. female.   The history is provided by the patient.   Patient presents for complaints of sore throat, left ear pain, eye discharge, sneezing, nasal congestion, and cough.  Symptoms started approximately 5 days ago, cough started approximately 1 day ago.  Patient states when her symptoms initially started, they wax and wane, but over the past 2 days, they have been more persistent.  She denies fever, chills, wheezing, shortness of breath, difficulty breathing, abdominal pain, vomiting, or diarrhea.  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  Patient also informs that her mother recently was notified that she tested positive for COVID.  Patient has not taken any medication for her symptoms as she takes psychotropic medications and wanted to ensure there was no contraindication.  Past Medical History:  Diagnosis Date   Amenorrhea 01/27/2013   Angio-edema    Breast pain 03/10/2015   Encounter for menstrual regulation 07/05/2015   History of pseudoseizure    Irregular intermenstrual bleeding 07/28/2015   Irregular periods 07/05/2015   Screening for STD (sexually transmitted disease) 08/07/2013   Urticaria    UTI (urinary tract infection)    Vaginal discharge 06/10/2013    Patient Active Problem List   Diagnosis Date Noted   Prediabetes 02/15/2020   Anxiety 11/18/2019   Depression, recurrent ([place]) 11/18/2019   Seasonal and perennial allergic rhinitis 05/15/2019   Amenorrhea 01/27/2013    Past Surgical History:  Procedure Laterality Date   APPENDECTOMY     CHOLECYSTECTOMY     TUBAL LIGATION      OB History     Gravida  2   Para  2   Term  1   Preterm  1   AB      Living  2      SAB      IAB      Ectopic      Multiple       Live Births               Home Medications    Prior to Admission medications   Medication Sig Start Date End Date Taking? Authorizing Provider  brompheniramine-pseudoephedrine-DM 30-2-10 MG/5ML syrup Take 5 mLs by mouth 4 (four) times daily as needed. 03/11/22  Yes Deklen Popelka-Warren, Alda Lea, NP  cetirizine (ZYRTEC) 10 MG tablet Take 1 tablet (10 mg total) by mouth daily. 03/11/22  Yes Sherron Mapp-Warren, Alda Lea, NP  fluticasone (FLONASE) 50 MCG/ACT nasal spray Place 2 sprays into both nostrils daily. 03/11/22  Yes Sriman Tally-Warren, Alda Lea, NP  sertraline (ZOLOFT) 50 MG tablet Take 1 tablet (50 mg total) by mouth daily. 12/08/21   [name], Fransisca Kaufmann, MD  traZODone (DESYREL) 100 MG tablet Take 1-1.5 tablets (100-150 mg total) by mouth as needed for sleep. 01/03/22   [name], Fransisca Kaufmann, MD    Family History Family History  Problem Relation Age of Onset   Autism Son    Diabetes Maternal Grandmother    Stroke Maternal Grandmother    Heart failure Maternal Grandmother    Stroke Maternal Grandfather    Stroke Father    Seizures Father    Allergies Father        penicillin   Heart attack Father  Other Mother        herpes   Hyperlipidemia Mother    Heart failure Paternal Grandfather    Diabetes Paternal Grandmother    Hypertension Paternal Grandmother    Autism Brother    Other Paternal Uncle        heart exploded   Cancer Maternal Aunt    Diabetes Maternal Aunt    Hypertension Maternal Aunt    Immunodeficiency Neg Hx    Urticaria Neg Hx    Eczema Neg Hx    Atopy Neg Hx    Asthma Neg Hx    Angioedema Neg Hx    Allergic rhinitis Neg Hx     Social History Social History   Tobacco Use   Smoking status: Every Day    Packs/day: 0.50    Types: Cigarettes   Smokeless tobacco: Never  Vaping Use   Vaping Use: Never used  Substance Use Topics   Alcohol use: No   Drug use: No     Allergies   Catfish [fish allergy], Doxycycline, Wellbutrin [bupropion], and  Tape   Review of Systems Review of Systems Per HPI  Physical Exam Triage Vital Signs ED Triage Vitals  Enc Vitals Group     BP 03/11/22 0939 111/65     Pulse Rate 03/11/22 0939 72     Resp 03/11/22 0939 18     Temp 03/11/22 0939 98.1 F (36.7 C)     Temp Source 03/11/22 0939 Oral     SpO2 03/11/22 0939 97 %     Weight --      Height --      Head Circumference --      Peak Flow --      Pain Score 03/11/22 0941 8     Pain Loc --      Pain Edu? --      Excl. in [place]? --    No data found.  Updated Vital Signs BP 111/65 (BP Location: Right Arm)   Pulse 72   Temp 98.1 F (36.7 C) (Oral)   Resp 18   LMP 02/22/2022 (Exact Date)   SpO2 97%   Visual Acuity Right Eye Distance:   Left Eye Distance:   Bilateral Distance:    Right Eye Near:   Left Eye Near:    Bilateral Near:     Physical Exam Vitals and nursing note reviewed.  Constitutional:      General: She is not in acute distress.    Appearance: Normal appearance.  HENT:     Head: Normocephalic.     Right Ear: Tympanic membrane, ear canal and external ear normal.     Left Ear: Tympanic membrane, ear canal and external ear normal.     Nose: Congestion present.     Mouth/Throat:     Mouth: Mucous membranes are moist.     Pharynx: Posterior oropharyngeal erythema present.     Comments: Cobblestoning present on tongue Eyes:     Extraocular Movements: Extraocular movements intact.     Conjunctiva/sclera: Conjunctivae normal.     Pupils: Pupils are equal, round, and reactive to light.  Cardiovascular:     Rate and Rhythm: Normal rate and regular rhythm.     Pulses: Normal pulses.     Heart sounds: Normal heart sounds.  Pulmonary:     Effort: Pulmonary effort is normal.     Breath sounds: Normal breath sounds.  Abdominal:     General: Bowel sounds are normal.  Palpations: Abdomen is soft.     Tenderness: There is no abdominal tenderness.  Musculoskeletal:     Cervical back: Normal range of motion.   Lymphadenopathy:     Cervical: No cervical adenopathy.  Skin:    General: Skin is warm and dry.  Neurological:     General: No focal deficit present.     Mental Status: She is alert and oriented to person, place, and time.  Psychiatric:        Mood and Affect: Mood normal.        Behavior: Behavior normal.      UC Treatments / Results  Labs (all labs ordered are listed, but only abnormal results are displayed) Labs Reviewed  SARS CORONAVIRUS 2 (TAT 6-24 HRS)  CULTURE, GROUP A STREP [facility])  POCT RAPID STREP A (OFFICE)    EKG   Radiology No results found.  Procedures Procedures (including critical care time)  Medications Ordered in UC Medications - No data to display  Initial Impression / Assessment and Plan / UC Course  I have reviewed the triage vital signs and the nursing notes.  Pertinent labs & imaging results that were available during my care of the patient were reviewed by me and considered in my medical decision making (see chart for details).  Patient presents for symptoms of upper respiratory infection that started approximately 5 days ago.  On exam, patient's vital signs are stable, she is in no acute distress.  Lung sounds are clear throughout.  Presence of cobblestoning to the tongue present.  Exam is otherwise benign.  Rapid strep test is negative, throat culture is pending.  Patient would like to have COVID testing done today, but understands that she is out of the window for antiviral treatment.  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  Differential diagnoses include allergic rhinitis versus upper respiratory infection versus COVID versus acute bronchitis.  Symptomatic treatment provided today to include Bromfed, cetirizine, and fluticasone.  Supportive care recommendations were provided to the patient.  Patient advised that she will be contacted if the COVID test or throat culture are positive.  Work note has been provided.   Patient was advised that if symptoms continue to persist over the next 7 to 10 days to follow-up in this clinic for reevaluation. Final Clinical Impressions(s) / UC Diagnoses   Final diagnoses:  Symptoms of upper respiratory infection (URI)  Cough, unspecified type     Discharge Instructions      The rapid strep test is negative.  Throat culture and COVID test are pending.  You will be contacted if the results of these test are positive.  Please understand that if your COVID test is positive, you are out of the window to receive an antiviral.  You would continue supportive care that is being provided today for your symptoms. Take medication as prescribed.  Increase fluids and get plenty of rest. May take over-the-counter ibuprofen or Tylenol as needed for pain, fever, or general discomfort. Recommend normal saline nasal spray to help with nasal congestion throughout the day. For your cough, it may be helpful to use a humidifier at bedtime during sleep. If your symptoms fail to improve within the next 7 to 10 days, please follow-up in our clinic.      ED Prescriptions     Medication Sig Dispense Auth. Provider   cetirizine (ZYRTEC) 10 MG tablet Take 1 tablet (10 mg total) by mouth daily. 30 tablet Jaylenn Altier-Warren, Adonis Brook  J, NP   fluticasone (FLONASE) 50 MCG/ACT nasal spray Place 2 sprays into both nostrils daily. 16 g Cailah Reach-Warren, Alda Lea, NP   brompheniramine-pseudoephedrine-DM 30-2-10 MG/5ML syrup Take 5 mLs by mouth 4 (four) times daily as needed. 140 mL Woodley Petzold-Warren, Alda Lea, NP      PDMP not reviewed this encounter.   Tish Men, NP 03/11/22 1014

## 2022-03-11 NOTE — ED Triage Notes (Signed)
Pt reports sore throat, left ear pain, eye problem discharge, sneezing, congestion x 5 days; cough x 1 day. Pt ha snot taken any medications for complaints.

## 2022-03-11 NOTE — Discharge Instructions (Addendum)
The rapid strep test is negative.  Throat culture and COVID test are pending.  You will be contacted if the results of these test are positive.  Please understand that if your COVID test is positive, you are out of the window to receive an antiviral.  You would continue supportive care that is being provided today for your symptoms. Take medication as prescribed.  Increase fluids and get plenty of rest. May take over-the-counter ibuprofen or Tylenol as needed for pain, fever, or general discomfort. Recommend normal saline nasal spray to help with nasal congestion throughout the day. For your cough, it may be helpful to use a humidifier at bedtime during sleep. If your symptoms fail to improve within the next 7 to 10 days, please follow-up in our clinic.

## 2022-03-12 ENCOUNTER — Ambulatory Visit (INDEPENDENT_AMBULATORY_CARE_PROVIDER_SITE_OTHER): Payer: Medicaid Other | Admitting: Nurse Practitioner

## 2022-03-12 ENCOUNTER — Encounter: Payer: Self-pay | Admitting: Nurse Practitioner

## 2022-03-12 ENCOUNTER — Encounter: Payer: Self-pay | Admitting: Family Medicine

## 2022-03-12 DIAGNOSIS — U071 COVID-19: Secondary | ICD-10-CM

## 2022-03-12 LAB — SARS CORONAVIRUS 2 (TAT 6-24 HRS): SARS Coronavirus 2: POSITIVE — AB

## 2022-03-12 MED ORDER — GUAIFENESIN ER 600 MG PO TB12: 600.0000 mg | ORAL_TABLET | Freq: Two times a day (BID) | ORAL | 0 refills | Status: DC

## 2022-03-12 MED ORDER — MOLNUPIRAVIR EUA 200MG CAPSULE: 4.0000 | ORAL_CAPSULE | Freq: Two times a day (BID) | ORAL | 0 refills | Status: AC

## 2022-03-12 NOTE — Progress Notes (Signed)
   Virtual Visit  Note Due to COVID-19 pandemic this visit was conducted virtually. This visit type was conducted due to national recommendations for restrictions regarding the COVID-19 Pandemic (e.g. social distancing, sheltering in place) in an effort to limit this patient's exposure and mitigate transmission in our community. All issues noted in this document were discussed and addressed.  A physical exam was not performed with this format.  I connected with Jaime Flores on 16/01/09 at 11:30 am by telephone and verified that I am speaking with the correct person using two identifiers. Jaime Flores is currently located at home during visit. The provider, Ivy Lynn, NP is located in their office at time of visit.  I discussed the limitations, risks, security and privacy concerns of performing an evaluation and management service by telephone and the availability of in person appointments. I also discussed with the patient that there may be a patient responsible charge related to this service. The patient expressed understanding and agreed to proceed.   History and Present Illness:  URI  This is a new problem. Episode onset: in the past 3 days. The problem has been unchanged. There has been no fever. Associated symptoms include congestion, coughing, headaches, nausea, sinus pain and a sore throat. Pertinent negatives include no abdominal pain or rash. She has tried nothing for the symptoms.      Review of Systems  Constitutional:  Positive for chills, fever and malaise/fatigue.  HENT:  Positive for congestion, sinus pain and sore throat.   Respiratory:  Positive for cough.   Gastrointestinal:  Positive for nausea. Negative for abdominal pain.  Skin: Negative.  Negative for itching and rash.  Neurological:  Positive for headaches.  All other systems reviewed and are negative.    Observations/Objective: Televisit patient not in distress.  Assessment and Plan: Patient  presents with positive COVID-19 in the past 3 days.  Completed education on Molnipurivir anti viral .Medication sent to pharmacy. Take meds as prescribed - Use a cool mist humidifier  -Use saline nose sprays frequently -Force fluids -For fever or aches or pains- take Tylenol or ibuprofen.   Follow Up Instructions: Follow-up with worsening unresolved symptoms.    I discussed the assessment and treatment plan with the patient. The patient was provided an opportunity to ask questions and all were answered. The patient agreed with the plan and demonstrated an understanding of the instructions.   The patient was advised to call back or seek an in-person evaluation if the symptoms worsen or if the condition fails to improve as anticipated.  The above assessment and management plan was discussed with the patient. The patient verbalized understanding of and has agreed to the management plan. Patient is aware to call the clinic if symptoms persist or worsen. Patient is aware when to return to the clinic for a follow-up visit. Patient educated on when it is appropriate to go to the emergency department.   Time call ended:  11:43 am   I provided 13 minutes of  non face-to-face time during this encounter.    Ivy Lynn, NP

## 2022-03-12 NOTE — Patient Instructions (Signed)

## 2022-03-14 LAB — CULTURE, GROUP A STREP (THRC)

## 2022-04-05 ENCOUNTER — Ambulatory Visit (INDEPENDENT_AMBULATORY_CARE_PROVIDER_SITE_OTHER): Payer: Medicaid Other | Admitting: Nurse Practitioner

## 2022-04-05 ENCOUNTER — Encounter: Payer: Self-pay | Admitting: Nurse Practitioner

## 2022-04-05 VITALS — BP 112/65 | HR 67 | Temp 97.4°F | Resp 20 | Ht 62.0 in | Wt 144.0 lb

## 2022-04-05 DIAGNOSIS — N61 Mastitis without abscess: Secondary | ICD-10-CM

## 2022-04-05 MED ORDER — CEPHALEXIN 500 MG PO CAPS: 500.0000 mg | ORAL_CAPSULE | Freq: Two times a day (BID) | ORAL | 0 refills | Status: DC

## 2022-04-05 NOTE — Progress Notes (Signed)
   Subjective:    Patient ID: Jaime Flores, female    DOB: [birth year], 36 y.o.   MRN: [id number]   Chief Complaint: Spot on left breast   HPI Has a sore spot on left breast. Had drainage a few days ago but nit now. Was sore  to touch but is not now.    Review of Systems  Constitutional:  Negative for diaphoresis.  Eyes:  Negative for pain.  Respiratory:  Negative for shortness of breath.   Cardiovascular:  Negative for chest pain, palpitations and leg swelling.  Gastrointestinal:  Negative for abdominal pain.  Endocrine: Negative for polydipsia.  Skin:  Negative for rash.  Neurological:  Negative for dizziness, weakness and headaches.  Hematological:  Does not bruise/bleed easily.  All other systems reviewed and are negative.      Objective:   Physical Exam Constitutional:      Appearance: Normal appearance.  Cardiovascular:     Rate and Rhythm: Normal rate and regular rhythm.     Heart sounds: Normal heart sounds.  Pulmonary:     Breath sounds: Normal breath sounds.  Chest:       Comments: 2cm tender erythematous lesionon left breast Skin:    General: Skin is warm.  Neurological:     General: No focal deficit present.     Mental Status: She is alert and oriented to person, place, and time.  Psychiatric:        Mood and Affect: Mood normal.        Behavior: Behavior normal.    BP 112/65   Pulse 67   Temp (!) 97.4 F (36.3 C) (Temporal)   Resp 20   Ht '5\' 2"'$  (1.575 m)   Wt 144 lb (65.3 kg)   LMP 02/22/2022 (Exact Date)   SpO2 98%   BMI 26.34 kg/m         Assessment & Plan:  Jaime Flores in today with chief complaint of Spot on left breast   1. Cellulitis of female breast Do not pick or scratch If not improving let me know  Meds ordered this encounter  Medications   cephALEXin (KEFLEX) 500 MG capsule    Sig: Take 1 capsule (500 mg total) by mouth 2 (two) times daily.    Dispense:  14 capsule    Refill:  0    Order Specific  Question:   Supervising Provider    Answer:   Caryl Pina A [[id number]]       The above assessment and management plan was discussed with the patient. The patient verbalized understanding of and has agreed to the management plan. Patient is aware to call the clinic if symptoms persist or worsen. Patient is aware when to return to the clinic for a follow-up visit. Patient educated on when it is appropriate to go to the emergency department.   Mary-Margaret [name], FNP

## 2022-04-09 NOTE — Telephone Encounter (Signed)
You can but make sure what you pop it with is clean, and let me know how it is looking in a few days

## 2022-05-01 ENCOUNTER — Telehealth: Payer: Self-pay

## 2022-05-01 DIAGNOSIS — F419 Anxiety disorder, unspecified: Secondary | ICD-10-CM

## 2022-05-01 DIAGNOSIS — F339 Major depressive disorder, recurrent, unspecified: Secondary | ICD-10-CM

## 2022-05-01 NOTE — Telephone Encounter (Signed)
Pt states that she would like for Dr. Warrick Parisian to increase her dose of Zoloft. Pt has been taking one in the am and one in pm. This is helpful.  Pt is alright to wait until Wednesday to discuss with [name].

## 2022-05-01 NOTE — Telephone Encounter (Signed)
Received PA request for Vraylar from [employer]  Key [id number]  Pt no longer takes this medication due to side effects of drowsiness. Verified with pt that she no longer takes.

## 2022-05-02 NOTE — Telephone Encounter (Signed)
Virtual visit scheduled for 10/12 at 3:40. Pt states that she works 6-2pm so this time will work.

## 2022-05-02 NOTE — Telephone Encounter (Signed)
For any changes like this we should have an appointment, please have her schedule an appointment and we can discuss what is going on and how we can help.

## 2022-05-03 ENCOUNTER — Encounter: Payer: Self-pay | Admitting: Family Medicine

## 2022-05-03 ENCOUNTER — Telehealth (INDEPENDENT_AMBULATORY_CARE_PROVIDER_SITE_OTHER): Payer: Medicaid Other | Admitting: Family Medicine

## 2022-05-03 DIAGNOSIS — F419 Anxiety disorder, unspecified: Secondary | ICD-10-CM | POA: Diagnosis not present

## 2022-05-03 DIAGNOSIS — F339 Major depressive disorder, recurrent, unspecified: Secondary | ICD-10-CM | POA: Diagnosis not present

## 2022-05-03 MED ORDER — SERTRALINE HCL 100 MG PO TABS
100.0000 mg | ORAL_TABLET | Freq: Every day | ORAL | 4 refills | Status: DC
Start: 2022-05-03 — End: 2022-06-08

## 2022-05-03 NOTE — Progress Notes (Signed)
Virtual Visit via mychart video Note  I connected with Jaime Flores on 11/57/26 at 1601 by video and verified that I am speaking with the correct person using two identifiers. Jaime Flores is currently located at home and patient are currently with her during visit. The provider, Fransisca Kaufmann Analyn Matusek, MD is located in their office at time of visit.  Call ended at 1615  I discussed the limitations, risks, security and privacy concerns of performing an evaluation and management service by video and the availability of in person appointments. I also discussed with the patient that there may be a patient responsible charge related to this service. The patient expressed understanding and agreed to proceed.   History and Present Illness: Anxiety and depression recheck Patient is calling in for anxiety depression recheck.  She is currently taking Zoloft 50 mg daily. She feels good and stress at work has increased and when she takes 2 pills it does help some. 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 She has been taking 2 of the zoloft.  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 She has been taking 2 for about 1 week. She denies suicidal ideations. She gets jittery at night.   Outpatient Encounter Medications as of 05/03/2022  Medication Sig   sertraline (ZOLOFT) 100 MG tablet Take 1 tablet (100 mg total) by mouth at bedtime.   brompheniramine-pseudoephedrine-DM 30-2-10 MG/5ML syrup Take 5 mLs by mouth 4 (four) times daily as needed.   cephALEXin (KEFLEX) 500 MG capsule Take 1 capsule (500 mg total) by mouth 2 (two) times daily.   cetirizine (ZYRTEC) 10 MG tablet Take 1 tablet (10 mg total) by mouth daily.   fluticasone (FLONASE) 50 MCG/ACT nasal spray Place 2 sprays into both nostrils daily.   guaiFENesin (MUCINEX) 600 MG 12 hr tablet Take 1 tablet (600 mg total) by mouth 2 (two) times daily.   traZODone (DESYREL) 100 MG tablet Take 1-1.5 tablets (100-150 mg total) by  mouth as needed for sleep.   [DISCONTINUED] sertraline (ZOLOFT) 50 MG tablet Take 1 tablet (50 mg total) by mouth daily.   No facility-administered encounter medications on file as of 05/03/2022.    Review of Systems  Constitutional:  Negative for chills and fever.  Eyes:  Negative for visual disturbance.  Respiratory:  Negative for chest tightness and shortness of breath.   Cardiovascular:  Negative for chest pain and leg swelling.  Skin:  Negative for rash.  Neurological:  Negative for dizziness, light-headedness and headaches.  Psychiatric/Behavioral:  Positive for dysphoric mood. Negative for agitation, behavioral problems, self-injury, sleep disturbance and suicidal ideas. The patient is nervous/anxious.   All other systems reviewed and are negative.   Observations/Objective: Patient sounds comfortable and in no acute distress  Assessment and Plan: Problem List Items Addressed This Visit       Other   Anxiety - Primary   Relevant Medications   sertraline (ZOLOFT) 100 MG tablet   Depression, recurrent (HCC)   Relevant Medications   sertraline (ZOLOFT) 100 MG tablet    Increase zoloft to 100 mg and see how she does. Follow up plan: Return if symptoms worsen or fail to improve, for 1-2 month anxiety depression.     I discussed the assessment and treatment plan with the patient. The patient was provided an opportunity to ask questions and all were answered. The patient agreed with the plan and demonstrated an understanding of the instructions.   The patient was advised to  call back or seek an in-person evaluation if the symptoms worsen or if the condition fails to improve as anticipated.  The above assessment and management plan was discussed with the patient. The patient verbalized understanding of and has agreed to the management plan. Patient is aware to call the clinic if symptoms persist or worsen. Patient is aware when to return to the clinic for a follow-up visit.  Patient educated on when it is appropriate to go to the emergency department.    I provided 14 minutes of non-face-to-face time during this encounter.    [name], MD

## 2022-06-08 ENCOUNTER — Encounter: Payer: Self-pay | Admitting: Family Medicine

## 2022-06-08 ENCOUNTER — Ambulatory Visit: Payer: Medicaid Other | Admitting: Family Medicine

## 2022-06-08 DIAGNOSIS — F339 Major depressive disorder, recurrent, unspecified: Secondary | ICD-10-CM | POA: Diagnosis not present

## 2022-06-08 DIAGNOSIS — G4700 Insomnia, unspecified: Secondary | ICD-10-CM | POA: Diagnosis not present

## 2022-06-08 DIAGNOSIS — F419 Anxiety disorder, unspecified: Secondary | ICD-10-CM | POA: Diagnosis not present

## 2022-06-08 DIAGNOSIS — R5383 Other fatigue: Secondary | ICD-10-CM | POA: Diagnosis not present

## 2022-06-08 MED ORDER — TRAZODONE HCL 100 MG PO TABS: 100.0000 mg | ORAL_TABLET | ORAL | 3 refills | Status: DC | PRN

## 2022-06-08 MED ORDER — SERTRALINE HCL 100 MG PO TABS: 100.0000 mg | ORAL_TABLET | Freq: Every day | ORAL | 4 refills | Status: DC

## 2022-06-08 NOTE — Progress Notes (Signed)
BP 118/68   Pulse 67   Temp 98.2 F (36.8 C)   Ht '5\' 2"'$  (1.575 m)   Wt 143 lb (64.9 kg)   SpO2 100%   BMI 26.16 kg/m    Subjective:   Patient ID: Jaime Flores, female    DOB: 1985-12-19, 36 y.o.   MRN: [id number]  HPI: Jaime Flores is a 36 y.o. female presenting on 06/08/2022 for Medical Management of Chronic Issues, Anxiety, and Depression   HPI Anxiety and depression Patient is currently taking Zoloft and trazodone.  Patient feels like she is doing a lot better and feels like it is going better and she likes her work better and she feels like the medicine is helping.  Denies any suicidal ideations.    01/03/2022   10:43 AM 07/04/2021   11:48 AM 05/02/2021    1:37 PM 02/09/2021    2:55 PM 08/26/2020   11:35 AM  Depression screen PHQ 2/9  Decreased Interest '2 3 2 1 1  '$ Down, Depressed, Hopeless '2 3 2 3 3  '$ PHQ - 2 Score '4 6 4 4 4  '$ Altered sleeping '3 3 3 3 3  '$ Tired, decreased energy '3 3 3 3 3  '$ Change in appetite '3 2 2 2 3  '$ Feeling bad or failure about yourself  '2 2 3 3 2  '$ Trouble concentrating 0 '3 2 3 '$ 0  Moving slowly or fidgety/restless 0 2 0 3 0  Suicidal thoughts '1 1 2 2 '$ 0  PHQ-9 Score '16 22 19 23 15  '$ Difficult doing work/chores Extremely dIfficult Somewhat difficult  Extremely dIfficult      Relevant past medical, surgical, family and social history reviewed and updated as indicated. Interim medical history since our last visit reviewed. Allergies and medications reviewed and updated.  Review of Systems  Constitutional:  Negative for chills and fever.  Eyes:  Negative for visual disturbance.  Respiratory:  Negative for chest tightness and shortness of breath.   Cardiovascular:  Negative for chest pain and leg swelling.  Musculoskeletal:  Negative for back pain and gait problem.  Skin:  Negative for rash.  Neurological:  Negative for dizziness, light-headedness and headaches.  Psychiatric/Behavioral:  Negative for agitation and behavioral problems.   All  other systems reviewed and are negative.   Per HPI unless specifically indicated above   Allergies as of 06/08/2022       Reactions   Catfish [fish Allergy] Shortness Of Breath   Doxycycline Shortness Of Breath   Wellbutrin [bupropion] Shortness Of Breath, Nausea Only, Palpitations   Tape Swelling   Plastic tape please use paper        Medication List        Accurate as of June 08, 2022  2:36 PM. If you have any questions, ask your nurse or doctor.          STOP taking these medications    brompheniramine-pseudoephedrine-DM 30-2-10 MG/5ML syrup Stopped by: Fransisca Kaufmann Halley Shepheard, MD   cephALEXin 500 MG capsule Commonly known as: Keflex Stopped by: [name], MD   cetirizine 10 MG tablet Commonly known as: ZYRTEC Stopped by: Fransisca Kaufmann Akili Cuda, MD   guaiFENesin 600 MG 12 hr tablet Commonly known as: Mucinex Stopped by: Fransisca Kaufmann Weslynn Ke, MD       TAKE these medications    fluticasone 50 MCG/ACT nasal spray Commonly known as: FLONASE Place 2 sprays into both nostrils daily.   sertraline 100 MG tablet Commonly known as: ZOLOFT Take  1 tablet (100 mg total) by mouth at bedtime.   traZODone 100 MG tablet Commonly known as: DESYREL Take 1-1.5 tablets (100-150 mg total) by mouth as needed for sleep.         Objective:   BP 118/68   Pulse 67   Temp 98.2 F (36.8 C)   Ht '5\' 2"'$  (1.575 m)   Wt 143 lb (64.9 kg)   SpO2 100%   BMI 26.16 kg/m   Wt Readings from Last 3 Encounters:  06/08/22 143 lb (64.9 kg)  04/05/22 144 lb (65.3 kg)  01/04/22 146 lb 9.6 oz (66.5 kg)    Physical Exam Vitals and nursing note reviewed.  Constitutional:      General: She is not in acute distress.    Appearance: She is well-developed. She is not diaphoretic.  Eyes:     Conjunctiva/sclera: Conjunctivae normal.  Cardiovascular:     Rate and Rhythm: Normal rate and regular rhythm.     Heart sounds: Normal heart sounds. No murmur heard. Pulmonary:      Effort: Pulmonary effort is normal. No respiratory distress.     Breath sounds: Normal breath sounds. No wheezing.  Musculoskeletal:        General: No swelling or tenderness. Normal range of motion.  Skin:    General: Skin is warm and dry.     Findings: No rash.  Neurological:     Mental Status: She is alert and oriented to person, place, and time.     Coordination: Coordination normal.  Psychiatric:        Behavior: Behavior normal.       Assessment & Plan:   Problem List Items Addressed This Visit       Other   Anxiety   Relevant Medications   sertraline (ZOLOFT) 100 MG tablet   traZODone (DESYREL) 100 MG tablet   Depression, recurrent (HCC)   Relevant Medications   sertraline (ZOLOFT) 100 MG tablet   traZODone (DESYREL) 100 MG tablet   Other Visit Diagnoses     Other fatigue       Relevant Medications   traZODone (DESYREL) 100 MG tablet   Insomnia, unspecified type       Relevant Medications   traZODone (DESYREL) 100 MG tablet       Continue Zoloft, seems to be doing well Follow up plan: Return in about 3 months (around 09/08/2022), or if symptoms worsen or fail to improve, for anxiety and depression.  Counseling provided for all of the vaccine components No orders of the defined types were placed in this encounter.   Caryl Pina, MD [facility] 06/08/2022, 2:36 PM

## 2022-07-07 ENCOUNTER — Other Ambulatory Visit: Payer: Self-pay

## 2022-07-07 ENCOUNTER — Emergency Department (HOSPITAL_COMMUNITY)
Admission: EM | Admit: 2022-07-07 | Discharge: 2022-07-07 | Disposition: A | Discharge status: Home / Self Care | Payer: Medicaid Other | Attending: Emergency Medicine | Admitting: Emergency Medicine

## 2022-07-07 ENCOUNTER — Encounter (HOSPITAL_COMMUNITY): Payer: Self-pay

## 2022-07-07 DIAGNOSIS — Z1152 Encounter for screening for COVID-19: Secondary | ICD-10-CM | POA: Insufficient documentation

## 2022-07-07 DIAGNOSIS — J029 Acute pharyngitis, unspecified: Secondary | ICD-10-CM | POA: Insufficient documentation

## 2022-07-07 DIAGNOSIS — R070 Pain in throat: Secondary | ICD-10-CM | POA: Diagnosis present

## 2022-07-07 LAB — GROUP A STREP BY PCR: Group A Strep by PCR: NOT DETECTED

## 2022-07-07 LAB — RESP PANEL BY RT-PCR (RSV, FLU A&B, COVID)  RVPGX2
Influenza A by PCR: NEGATIVE
Influenza B by PCR: NEGATIVE
Resp Syncytial Virus by PCR: NEGATIVE
SARS Coronavirus 2 by RT PCR: NEGATIVE

## 2022-07-07 MED ORDER — PREDNISONE 20 MG PO TABS: 40.0000 mg | ORAL_TABLET | Freq: Every day | ORAL | 0 refills | Status: DC

## 2022-07-07 NOTE — ED Provider Notes (Signed)
[facility] EMERGENCY DEPARTMENT Provider Note   CSN: [id number] Arrival date & time: 07/07/22  4580     History  Chief Complaint  Patient presents with   Sore Throat    Jaime Flores is a 36 y.o. female.  Patient presents to the emergency department for evaluation of headache, chills, nausea, sore throat.  Patient reports that symptoms began yesterday.  She came to the ER today because it felt like she was tasting blood in her throat.       Home Medications Prior to Admission medications   Medication Sig Start Date End Date Taking? Authorizing Provider  predniSONE (DELTASONE) 20 MG tablet Take 2 tablets (40 mg total) by mouth daily with breakfast. 07/07/22  Yes Jianni Batten, Gwenyth Allegra, MD  fluticasone (FLONASE) 50 MCG/ACT nasal spray Place 2 sprays into both nostrils daily. 03/11/22   Leath-Warren, Alda Lea, NP  sertraline (ZOLOFT) 100 MG tablet Take 1 tablet (100 mg total) by mouth at bedtime. 06/08/22   [name], Fransisca Kaufmann, MD  traZODone (DESYREL) 100 MG tablet Take 1-1.5 tablets (100-150 mg total) by mouth as needed for sleep. 06/08/22   [name], Fransisca Kaufmann, MD      Allergies    Catfish [fish allergy], Doxycycline, Wellbutrin [bupropion], and Tape    Review of Systems   Review of Systems  Physical Exam Updated Vital Signs BP 117/76 (BP Location: Right Arm)   Pulse 88   Temp 98.4 F (36.9 C) (Oral)   Resp 18   Ht '5\' 2"'$  (1.575 m)   Wt 65 kg   SpO2 98%   BMI 26.21 kg/m  Physical Exam Vitals and nursing note reviewed.  Constitutional:      General: She is not in acute distress.    Appearance: She is well-developed.  HENT:     Head: Normocephalic and atraumatic.     Mouth/Throat:     Mouth: Mucous membranes are moist.  Eyes:     General: Vision grossly intact. Gaze aligned appropriately.     Extraocular Movements: Extraocular movements intact.     Conjunctiva/sclera: Conjunctivae normal.  Cardiovascular:     Rate and Rhythm: Normal rate and regular  rhythm.     Pulses: Normal pulses.     Heart sounds: Normal heart sounds, S1 normal and S2 normal. No murmur heard.    No friction rub. No gallop.  Pulmonary:     Effort: Pulmonary effort is normal. No respiratory distress.     Breath sounds: Normal breath sounds.  Abdominal:     General: Bowel sounds are normal.     Palpations: Abdomen is soft.     Tenderness: There is no abdominal tenderness. There is no guarding or rebound.     Hernia: No hernia is present.  Musculoskeletal:        General: No swelling.     Cervical back: Full passive range of motion without pain, normal range of motion and neck supple. No spinous process tenderness or muscular tenderness. Normal range of motion.     Right lower leg: No edema.     Left lower leg: No edema.  Skin:    General: Skin is warm and dry.     Capillary Refill: Capillary refill takes less than 2 seconds.     Findings: No ecchymosis, erythema, rash or wound.  Neurological:     General: No focal deficit present.     Mental Status: She is alert and oriented to person, place, and time.  GCS: GCS eye subscore is 4. GCS verbal subscore is 5. GCS motor subscore is 6.     Cranial Nerves: Cranial nerves 2-12 are intact.     Sensory: Sensation is intact.     Motor: Motor function is intact.     Coordination: Coordination is intact.  Psychiatric:        Attention and Perception: Attention normal.        Mood and Affect: Mood normal.        Speech: Speech normal.        Behavior: Behavior normal.     ED Results / Procedures / Treatments   Labs (all labs ordered are listed, but only abnormal results are displayed) Labs Reviewed  RESP PANEL BY RT-PCR (RSV, FLU A&B, COVID)  [id number]  GROUP A STREP BY PCR    EKG None  Radiology No results found.  Procedures Procedures    Medications Ordered in ED Medications - No data to display  ED Course/ Medical Decision Making/ A&P                           Medical Decision Making  Dents  with sore throat and URI symptoms.  She appears well otherwise.  Oropharyngeal examination is unremarkable.  Lungs are clear.  Influenza, RSV, COVID-negative.  Strep negative.  Treat for viral URI.        Final Clinical Impression(s) / ED Diagnoses Final diagnoses:  Pharyngitis, unspecified etiology    Rx / DC Orders ED Discharge Orders          Ordered    predniSONE (DELTASONE) 20 MG tablet  Daily with breakfast        07/07/22 0636              Orpah Greek, MD 07/07/22 [id number]

## 2022-07-07 NOTE — ED Triage Notes (Signed)
Pt arrived via POV c/o sore throat, HA, chills and nausea since yesterday.

## 2022-07-09 ENCOUNTER — Telehealth: Payer: Self-pay

## 2022-07-09 NOTE — Patient Outreach (Signed)
Transition Care Management Follow-up Telephone Call Date of discharge and from where: 07/07/22 [facility] How have you been since you were released from the hospital? Still sore, I only got 3 days of medications Any questions or concerns? Yes  Items Reviewed: Did the pt receive and understand the discharge instructions provided? Yes  Medications obtained and verified? Yes  Other? No  Any new allergies since your discharge? No  Dietary orders reviewed? No Do you have support at home? Yes   Home Care and Equipment/Supplies: Were home health services ordered? not applicable If so, what is the name of the agency?   Has the agency set up a time to come to the patient's home? not applicable Were any new equipment or medical supplies ordered?  No What is the name of the medical supply agency?  Were you able to get the supplies/equipment? not applicable Do you have any questions related to the use of the equipment or supplies? No  Functional Questionnaire: (I = Independent and D = Dependent) ADLs: I  Bathing/Dressing- I  Meal Prep- I  Eating- I  Maintaining continence- I  Transferring/Ambulation- I  Managing Meds- I  Follow up appointments reviewed:  PCP Hospital f/u appt confirmed? No  Scheduled to see  on  @ . [facility] f/u appt confirmed? No  Scheduled to see  on  @ . Are transportation arrangements needed? No  If their condition worsens, is the pt aware to call PCP or go to the Emergency Dept.? Yes Was the patient provided with contact information for the PCP's office or ED? Yes Was to pt encouraged to call back with questions or concerns? Yes .AJS

## 2022-07-10 ENCOUNTER — Ambulatory Visit (INDEPENDENT_AMBULATORY_CARE_PROVIDER_SITE_OTHER): Payer: Medicaid Other | Admitting: Family Medicine

## 2022-07-10 ENCOUNTER — Encounter: Payer: Self-pay | Admitting: Family Medicine

## 2022-07-10 DIAGNOSIS — H938X3 Other specified disorders of ear, bilateral: Secondary | ICD-10-CM | POA: Diagnosis not present

## 2022-07-10 DIAGNOSIS — J069 Acute upper respiratory infection, unspecified: Secondary | ICD-10-CM

## 2022-07-10 MED ORDER — FLUTICASONE PROPIONATE 50 MCG/ACT NA SUSP: 2.0000 | Freq: Every day | NASAL | 0 refills | Status: DC

## 2022-07-10 MED ORDER — PREDNISONE 10 MG (21) PO TBPK: ORAL_TABLET | ORAL | 0 refills | Status: DC

## 2022-07-10 NOTE — Progress Notes (Signed)
Virtual Visit via telephone Note Due to COVID-19 pandemic this visit was conducted virtually. This visit type was conducted due to national recommendations for restrictions regarding the COVID-19 Pandemic (e.g. social distancing, sheltering in place) in an effort to limit this patient's exposure and mitigate transmission in our community. All issues noted in this document were discussed and addressed.  A physical exam was not performed with this format.   I connected with Jaime Flores on 81/07/7508 at [facility] by telephone and verified that I am speaking with the correct person using two identifiers. Jaime Flores is currently located at home and patient is currently with them during visit. The provider, Monia Pouch, FNP is located in their office at time of visit.  I discussed the limitations, risks, security and privacy concerns of performing an evaluation and management service by virtual visit and the availability of in person appointments. I also discussed with the patient that there may be a patient responsible charge related to this service. The patient expressed understanding and agreed to proceed.  Subjective:  Patient ID: Jaime Flores, female    DOB: 1985/09/07, 36 y.o.   MRN: [id number]  Chief Complaint:  URI   HPI: [name] is a 36 y.o. female presenting on 07/10/2022 for URI   Pt reports ongoing URI symptoms. States this started out as a sore throat. She went to the ED and was negative for strep, influenza, COVID, and RSV. She states now she has congestion and fullness and popping in both ears. She took prednisone for 3 days and has not been using her Flonase as she is out of it. She denies fever or chills.   URI  This is a new problem. The current episode started in the past 7 days. The problem has been waxing and waning. There has been no fever. Associated symptoms include congestion, coughing, ear pain, a plugged ear sensation, rhinorrhea and a sore throat.  Pertinent negatives include no abdominal pain, chest pain, diarrhea, dysuria, headaches, joint pain, joint swelling, nausea, neck pain, rash, sinus pain, sneezing, swollen glands, vomiting or wheezing. She has tried acetaminophen (prednisone) for the symptoms. The treatment provided mild relief.     Relevant past medical, surgical, family, and social history reviewed and updated as indicated.  Allergies and medications reviewed and updated.   Past Medical History:  Diagnosis Date   Amenorrhea 01/27/2013   Angio-edema    Breast pain 03/10/2015   Encounter for menstrual regulation 07/05/2015   History of pseudoseizure    Irregular intermenstrual bleeding 07/28/2015   Irregular periods 07/05/2015   Screening for STD (sexually transmitted disease) 08/07/2013   Urticaria    UTI (urinary tract infection)    Vaginal discharge 06/10/2013    Past Surgical History:  Procedure Laterality Date   APPENDECTOMY     CHOLECYSTECTOMY     TUBAL LIGATION      Social History   Socioeconomic History   Marital status: Married    Spouse name: Not on file   Number of children: 2   Years of education: 12   Highest education level: High school graduate  Occupational History   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    Right-handed.   Caffeine use: 0.5 - 0.75 liters of soda per day, two cups Mocha Frappe on weekdays   Social Determinants of Health   Financial Resource Strain: Not on file  Food Insecurity: No Food Insecurity (07/09/2022)   Hunger Vital Sign    Worried About Running Out of  Food in the Last Year: Never true    Ran Out of Food in the Last Year: Never true  Transportation Needs: No Transportation Needs (07/09/2022)   PRAPARE - Hydrologist (Medical): No    Lack of Transportation (Non-Medical): No  Physical Activity: Not on file  Stress: Not on file  Social Connections: Not on file  Intimate Partner Violence: Not on file    Outpatient Encounter Medications as of 07/10/2022  Medication Sig   predniSONE (STERAPRED UNI-PAK 21 TAB) 10 MG (21) TBPK tablet As directed x 6 days   fluticasone (FLONASE) 50 MCG/ACT nasal spray Place 2 sprays into both nostrils daily.   sertraline (ZOLOFT) 100 MG tablet Take 1 tablet (100 mg total) by mouth at bedtime.   traZODone (DESYREL) 100 MG tablet Take 1-1.5 tablets (100-150 mg total) by mouth as needed for sleep.   [DISCONTINUED] fluticasone (FLONASE) 50 MCG/ACT nasal spray Place 2 sprays into both nostrils daily.   [DISCONTINUED] predniSONE (DELTASONE) 20 MG tablet Take 2 tablets (40 mg total) by mouth daily with breakfast.   No facility-administered encounter medications on file as of 07/10/2022.    Allergies  Allergen Reactions   Catfish [Fish Allergy] Shortness Of Breath   Doxycycline Shortness Of Breath   Wellbutrin [Bupropion] Shortness Of Breath, Nausea Only and Palpitations   Tape Swelling    Plastic tape please use paper    Review of Systems  Constitutional:  Positive for activity change and appetite change. Negative for chills, diaphoresis, fatigue, fever and unexpected weight change.  HENT:  Positive for congestion, ear pain, postnasal drip, rhinorrhea, sore throat and tinnitus. Negative for dental problem, drooling, ear discharge, facial swelling, hearing loss, mouth sores, nosebleeds, sinus pressure, sinus pain, sneezing, trouble swallowing and voice change.   Eyes:  Negative for photophobia and visual disturbance.  Respiratory:  Positive for cough. Negative for apnea, choking,  shortness of breath, wheezing and stridor.   Cardiovascular:  Negative for chest pain.  Gastrointestinal:  Negative for abdominal pain, diarrhea, nausea and vomiting.  Genitourinary:  Negative for decreased urine volume, difficulty urinating and dysuria.  Musculoskeletal:  Negative for joint pain and neck pain.  Skin:  Negative for rash.  Neurological:  Negative for dizziness, tremors, seizures, syncope, facial asymmetry, speech difficulty, weakness, light-headedness, numbness and headaches.  Psychiatric/Behavioral:  Negative for confusion.   All other systems reviewed and are negative.        Observations/Objective: No vital signs or physical exam, this was a virtual health encounter.  Pt alert and oriented, answers all questions appropriately, and able to speak in full sentences.    Assessment and Plan: Marlyne was seen today for uri.  Diagnoses and all orders for this visit:  URI with cough and congestion Sensation of fullness in both ears Continued URI symptoms. No indications of acute bacterial infection. Will lengthen the course of steroids and restart Flonase. Pt aware of symptomatic care at home. Report new, worsening, or persistent  symptoms.  -     fluticasone (FLONASE) 50 MCG/ACT nasal spray; Place 2 sprays into both nostrils daily. -     predniSONE (STERAPRED UNI-PAK 21 TAB) 10 MG (21) TBPK tablet; As directed x 6 days     Follow Up Instructions: Return if symptoms worsen or fail to improve.    I discussed the assessment and treatment plan with the patient. The patient was provided an opportunity to ask questions and all were answered. The patient agreed with the plan and demonstrated an understanding of the instructions.   The patient was advised to call back or seek an in-person evaluation if the symptoms worsen or if the condition fails to improve as anticipated.  The above assessment and management plan was discussed with the patient. The patient verbalized  understanding of and has agreed to the management plan. Patient is aware to call the clinic if they develop any new symptoms or if symptoms persist or worsen. Patient is aware when to return to the clinic for a follow-up visit. Patient educated on when it is appropriate to go to the emergency department.    I provided 11 minutes of time during this telephone encounter.   Monia Pouch, FNP-C [facility] [address] [place], [place] [address] [phone] 07/10/2022

## 2022-07-15 ENCOUNTER — Encounter: Payer: Self-pay | Admitting: Family Medicine

## 2022-07-18 ENCOUNTER — Telehealth: Payer: Self-pay | Admitting: Nurse Practitioner

## 2022-07-18 ENCOUNTER — Encounter: Payer: Self-pay | Admitting: Nurse Practitioner

## 2022-07-18 ENCOUNTER — Ambulatory Visit: Payer: Medicaid Other | Admitting: Nurse Practitioner

## 2022-07-18 ENCOUNTER — Ambulatory Visit: Payer: Medicaid Other

## 2022-07-18 VITALS — BP 110/73 | HR 79 | Temp 97.9°F | Resp 20 | Ht 62.0 in | Wt 147.0 lb

## 2022-07-18 DIAGNOSIS — J40 Bronchitis, not specified as acute or chronic: Secondary | ICD-10-CM | POA: Diagnosis not present

## 2022-07-18 DIAGNOSIS — R0981 Nasal congestion: Secondary | ICD-10-CM | POA: Diagnosis not present

## 2022-07-18 LAB — VERITOR FLU A/B WAIVED
Influenza A: NEGATIVE
Influenza B: NEGATIVE

## 2022-07-18 MED ORDER — PREDNISONE 20 MG PO TABS: 40.0000 mg | ORAL_TABLET | Freq: Every day | ORAL | 0 refills | Status: AC

## 2022-07-18 MED ORDER — HYDROCODONE BIT-HOMATROP MBR 5-1.5 MG/5ML PO SOLN: 5.0000 mL | Freq: Four times a day (QID) | ORAL | 0 refills | Status: DC | PRN

## 2022-07-18 MED ORDER — AZITHROMYCIN 250 MG PO TABS: ORAL_TABLET | ORAL | 0 refills | Status: DC

## 2022-07-18 NOTE — Progress Notes (Signed)
Subjective:    Patient ID: Jaime Flores, female    DOB: [birth year], 36 y.o.   MRN: [id number]   Chief Complaint: Cough, Fever, and Nasal Congestion   Cough This is a new problem. The current episode started 1 to 4 weeks ago. The problem has been waxing and waning. The problem occurs constantly. The cough is Productive of sputum. Associated symptoms include chills, ear congestion, a fever (103 last night), myalgias, nasal congestion and rhinorrhea. Nothing aggravates the symptoms. Treatments tried: mucinex. The treatment provided mild relief.       Review of Systems  Constitutional:  Positive for chills and fever (103 last night).  HENT:  Positive for rhinorrhea.   Respiratory:  Positive for cough.   Musculoskeletal:  Positive for myalgias.       Objective:   Physical Exam Constitutional:      Appearance: Normal appearance.  HENT:     Right Ear: Tympanic membrane normal.     Left Ear: Tympanic membrane normal.     Nose: Congestion and rhinorrhea present.     Mouth/Throat:     Pharynx: Posterior oropharyngeal erythema present. No oropharyngeal exudate.  Cardiovascular:     Rate and Rhythm: Normal rate and regular rhythm.     Pulses: Normal pulses.     Heart sounds: Normal heart sounds.  Pulmonary:     Effort: Pulmonary effort is normal.     Breath sounds: Rales present.  Skin:    General: Skin is warm.  Neurological:     General: No focal deficit present.     Mental Status: She is alert and oriented to person, place, and time.  Psychiatric:        Mood and Affect: Mood normal.        Behavior: Behavior normal.    BP 110/73   Pulse 79   Temp 97.9 F (36.6 C) (Temporal)   Resp 20   Ht '5\' 2"'$  (1.575 m)   Wt 147 lb (66.7 kg)   SpO2 96%   BMI 26.89 kg/m         Assessment & Plan:  SILAS SEDAM in today with chief complaint of Cough, Fever, and Nasal Congestion   1. Nasal congestion - Veritor Flu A/B Waived - Novel Coronavirus, NAA  ([facility])  2. Bronchitis 1. Take meds as prescribed 2. Use a cool mist humidifier especially during the winter months and when heat has been humid. 3. Use saline nose sprays frequently 4. Saline irrigations of the nose can be very helpful if done frequently.  * 4X daily for 1 week*  * Use of a nettie pot can be helpful with this. Follow directions with this* 5. Drink plenty of fluids 6. Keep thermostat turn down low 7.For any cough or congestion- hycodan 8. For fever or aces or pains- take tylenol or ibuprofen appropriate for age and weight.  * for fevers greater than 101 orally you may alternate ibuprofen and tylenol every  3 hours.    - azithromycin (ZITHROMAX Z-PAK) 250 MG tablet; As directed  Dispense: 6 tablet; Refill: 0 - predniSONE (DELTASONE) 20 MG tablet; Take 2 tablets (40 mg total) by mouth daily with breakfast for 5 days. 2 po daily for 5 days  Dispense: 10 tablet; Refill: 0 - HYDROcodone bit-homatropine (HYCODAN) 5-1.5 MG/5ML syrup; Take 5 mLs by mouth every 6 (six) hours as needed for cough.  Dispense: 120 mL; Refill: 0    The above assessment and management plan was discussed with  the patient. The patient verbalized understanding of and has agreed to the management plan. Patient is aware to call the clinic if symptoms persist or worsen. Patient is aware when to return to the clinic for a follow-up visit. Patient educated on when it is appropriate to go to the emergency department.   Mary-Margaret [name], FNP

## 2022-07-18 NOTE — Patient Instructions (Signed)
1. Take meds as prescribed 2. Use a cool mist humidifier especially during the winter months and when heat has been humid. 3. Use saline nose sprays frequently 4. Saline irrigations of the nose can be very helpful if done frequently.  * 4X daily for 1 week*  * Use of a nettie pot can be helpful with this. Follow directions with this* 5. Drink plenty of fluids 6. Keep thermostat turn down low 7.For any cough or congestion- hycodan 8. For fever or aces or pains- take tylenol or ibuprofen appropriate for age and weight.  * for fevers greater than 101 orally you may alternate ibuprofen and tylenol every  3 hours.    

## 2022-07-18 NOTE — Telephone Encounter (Signed)
Letter written and faxed to work place per their request

## 2022-07-18 NOTE — Telephone Encounter (Signed)
Patients work just called stating that the work note they received today was not completed. Wants to know if it can be corrected and faxed to them at [phone].

## 2022-07-19 LAB — NOVEL CORONAVIRUS, NAA: SARS-CoV-2, NAA: NOT DETECTED

## 2022-07-23 ENCOUNTER — Emergency Department (HOSPITAL_COMMUNITY): Payer: Medicaid Other

## 2022-07-23 ENCOUNTER — Encounter (HOSPITAL_COMMUNITY): Payer: Self-pay | Admitting: Emergency Medicine

## 2022-07-23 ENCOUNTER — Other Ambulatory Visit: Payer: Self-pay

## 2022-07-23 ENCOUNTER — Emergency Department (HOSPITAL_COMMUNITY)
Admission: EM | Admit: 2022-07-23 | Discharge: 2022-07-23 | Disposition: A | Discharge status: Home / Self Care | Payer: Medicaid Other | Attending: Emergency Medicine | Admitting: Emergency Medicine

## 2022-07-23 DIAGNOSIS — R0789 Other chest pain: Secondary | ICD-10-CM | POA: Diagnosis not present

## 2022-07-23 DIAGNOSIS — E876 Hypokalemia: Secondary | ICD-10-CM | POA: Insufficient documentation

## 2022-07-23 DIAGNOSIS — J9811 Atelectasis: Secondary | ICD-10-CM | POA: Diagnosis not present

## 2022-07-23 DIAGNOSIS — R079 Chest pain, unspecified: Secondary | ICD-10-CM | POA: Diagnosis present

## 2022-07-23 LAB — BASIC METABOLIC PANEL
Anion gap: 7 (ref 5–15)
BUN: 8 mg/dL (ref 6–20)
CO2: 28 mmol/L (ref 22–32)
Calcium: 8.3 mg/dL — ABNORMAL LOW (ref 8.9–10.3)
Chloride: 106 mmol/L (ref 98–111)
Creatinine, Ser: 0.61 mg/dL (ref 0.44–1.00)
GFR, Estimated: >60 mL/min (ref >60)
Glucose, Bld: 128 mg/dL — ABNORMAL HIGH (ref 70–99)
Potassium: 2.8 mmol/L — ABNORMAL LOW (ref 3.5–5.1)
Sodium: 141 mmol/L (ref 135–145)

## 2022-07-23 LAB — TROPONIN I (HIGH SENSITIVITY)
Troponin I (High Sensitivity): <2 ng/L (ref <18)
Troponin I (High Sensitivity): <2 ng/L (ref <18)

## 2022-07-23 LAB — CBC
HCT: 34.9 % — ABNORMAL LOW (ref 36.0–46.0)
Hemoglobin: 11.1 g/dL — ABNORMAL LOW (ref 12.0–15.0)
MCH: 28.8 pg (ref 26.0–34.0)
MCHC: 31.8 g/dL (ref 30.0–36.0)
MCV: 90.4 fL (ref 80.0–100.0)
Platelets: 437 10*3/uL — ABNORMAL HIGH (ref 150–400)
RBC: 3.86 MIL/uL — ABNORMAL LOW (ref 3.87–5.11)
RDW: 14.7 % (ref 11.5–15.5)
WBC: 11.9 10*3/uL — ABNORMAL HIGH (ref 4.0–10.5)
nRBC: 0 % (ref 0.0–0.2)

## 2022-07-23 LAB — POC URINE PREG, ED: Preg Test, Ur: NEGATIVE

## 2022-07-23 MED ORDER — DICLOFENAC SODIUM 75 MG PO TBEC: 75.0000 mg | DELAYED_RELEASE_TABLET | Freq: Two times a day (BID) | ORAL | 0 refills | Status: DC

## 2022-07-23 MED ORDER — IBUPROFEN 800 MG PO TABS
800.0000 mg | ORAL_TABLET | Freq: Once | ORAL | Status: AC
  Administered 2022-07-23: 800 mg via ORAL
  Filled 2022-07-23: qty 1

## 2022-07-23 MED ORDER — POTASSIUM CHLORIDE CRYS ER 20 MEQ PO TBCR
40.0000 meq | EXTENDED_RELEASE_TABLET | Freq: Once | ORAL | Status: AC
  Administered 2022-07-23: 40 meq via ORAL
  Filled 2022-07-23: qty 2

## 2022-07-23 NOTE — ED Notes (Signed)
Pt returned from xray

## 2022-07-23 NOTE — Discharge Instructions (Addendum)
Take a multivitamin with potassium.  Follow up with your Physician for recheck.

## 2022-07-23 NOTE — ED Triage Notes (Signed)
Pt to ed pov. C/o of being tx for bronchitis by pcp. Pt completed z-pack, prednisone and hydrocodone cough syrup. Pt feeling tightness in chest and concerned for pneumonia. Told by pcp to come to ed for an evaluation. NAD

## 2022-07-25 NOTE — ED Provider Notes (Signed)
[facility] EMERGENCY DEPARTMENT Provider Note   CSN: [id number] Arrival date & time: 07/23/22  1938     History  Chief Complaint  Patient presents with   Chest Pain    Jaime Flores is a 37 y.o. female.  Patient presents to the emergency department complaining of pain in her chest.  Patient reports that she was recently treated by her primary care physician with Zithromax prednisone and Hycodan cough syrup.  Patient reports increasing symptoms.  Patient reports her primary care physician advised her to come to the emergency department for evaluation of possible pneumonia.  Patient does not have any difficulty breathing she denies any current shortness of breath   Chest Pain      Home Medications Prior to Admission medications   Medication Sig Start Date End Date Taking? Authorizing Provider  diclofenac (VOLTAREN) 75 MG EC tablet Take 1 tablet (75 mg total) by mouth 2 (two) times daily. 07/23/22  Yes Caryl Ada K, PA-C  azithromycin (ZITHROMAX Z-PAK) 250 MG tablet As directed 07/18/22   [name], Mary-Margaret, FNP  fluticasone (FLONASE) 50 MCG/ACT nasal spray Place 2 sprays into both nostrils daily. 07/10/22   [name], FNP  HYDROcodone bit-homatropine (HYCODAN) 5-1.5 MG/5ML syrup Take 5 mLs by mouth every 6 (six) hours as needed for cough. 07/18/22   [name], Mary-Margaret, FNP  sertraline (ZOLOFT) 100 MG tablet Take 1 tablet (100 mg total) by mouth at bedtime. 06/08/22   [name], Fransisca Kaufmann, MD  traZODone (DESYREL) 100 MG tablet Take 1-1.5 tablets (100-150 mg total) by mouth as needed for sleep. 06/08/22   [name], Fransisca Kaufmann, MD      Allergies    Catfish [fish allergy], Doxycycline, Wellbutrin [bupropion], and Tape    Review of Systems   Review of Systems  Cardiovascular:  Positive for chest pain.  All other systems reviewed and are negative.   Physical Exam Updated Vital Signs BP 104/65 (BP Location: Left Arm)   Pulse 68   Temp 97.9 F (36.6 C) (Oral)    Resp 14   Ht '5\' 2"'$  (1.575 m)   Wt 66.7 kg   LMP 07/17/2022 (Exact Date) Comment: pt has had a tubal ligation  SpO2 96%   BMI 26.89 kg/m  Physical Exam Vitals and nursing note reviewed.  Constitutional:      Appearance: She is well-developed.  HENT:     Head: Normocephalic.  Cardiovascular:     Rate and Rhythm: Normal rate and regular rhythm.     Heart sounds: Normal heart sounds.  Pulmonary:     Effort: Pulmonary effort is normal.  Abdominal:     General: There is no distension.     Palpations: Abdomen is soft.  Musculoskeletal:        General: Normal range of motion.     Cervical back: Normal range of motion.  Neurological:     Mental Status: She is alert and oriented to person, place, and time.     ED Results / Procedures / Treatments   Labs (all labs ordered are listed, but only abnormal results are displayed) Labs Reviewed  BASIC METABOLIC PANEL - Abnormal; Notable for the following components:      Result Value   Potassium 2.8 (*)    Glucose, Bld 128 (*)    Calcium 8.3 (*)    All other components within normal limits  CBC - Abnormal; Notable for the following components:   WBC 11.9 (*)    RBC 3.86 (*)  Hemoglobin 11.1 (*)    HCT 34.9 (*)    Platelets 437 (*)    All other components within normal limits  POC URINE PREG, ED  TROPONIN I (HIGH SENSITIVITY)  TROPONIN I (HIGH SENSITIVITY)    EKG EKG Interpretation  Date/Time:  Monday July 23 2022 19:44:42 EST Ventricular Rate:  65 PR Interval:  124 QRS Duration: 82 QT Interval:  364 QTC Calculation: 378 R Axis:   76 Text Interpretation: Normal sinus rhythm Normal ECG No significant change since last tracing Confirmed by Deno Etienne [id number]) on 07/24/2022 7:41:31 AM  Radiology DG Chest 2 View  Result Date: 07/23/2022 CLINICAL DATA:  Chest tightness. EXAM: CHEST - 2 VIEW COMPARISON:  February 07, 2021 FINDINGS: The heart size and mediastinal contours are within normal limits. Mild atelectasis is seen within  bilateral lung bases. A mild amount of overlying breast attenuation artifact is also seen within these regions. There is no evidence of a pleural effusion or pneumothorax. The visualized skeletal structures are unremarkable. IMPRESSION: Mild bibasilar atelectasis. Electronically Signed   By: Virgina Norfolk M.D.   On: 07/23/2022 20:37    Procedures Procedures    Medications Ordered in ED Medications  ibuprofen (ADVIL) tablet 800 mg (800 mg Oral Given 07/23/22 2242)  potassium chloride SA (KLOR-CON M) CR tablet 40 mEq (40 mEq Oral Given 07/23/22 2242)    ED Course/ Medical Decision Making/ A&P                           Medical Decision Making Patient complains of pain in her chest she was recently been treated for bronchitis.  Amount and/or Complexity of Data Reviewed Labs: ordered. Decision-making details documented in ED Course.    Details: Labs ordered reviewed and interpreted patient's potassium is slightly decreased at 2.8 Radiology: ordered and independent interpretation performed. Decision-making details documented in ED Course.    Details: Chest x-ray shows slight atelectasis no evidence of pneumonia ECG/medicine tests: ordered and independent interpretation performed. Decision-making details documented in ED Course.    Details: EKG shows no evidence of ischemia or acute abnormality  Risk Prescription drug management. Risk Details: Patient advised pain is most likely muscular secondary to recent upper respiratory infection patient is given a prescription for Voltaren she is given potassium here she is advised to take a multivitamin with potassium.  Patient is advised to follow-up with her primary care physician           Final Clinical Impression(s) / ED Diagnoses Final diagnoses:  Chest wall pain  Hypokalemia    Rx / DC Orders ED Discharge Orders          Ordered    diclofenac (VOLTAREN) 75 MG EC tablet  2 times daily        07/23/22 2228           An  After Visit Summary was printed and given to the patient.    Fransico Meadow, [place] 07/25/22 1434    Milton Heppler, MD 07/28/22 1110

## 2022-07-26 ENCOUNTER — Telehealth: Payer: Self-pay | Admitting: Obstetrics and Gynecology

## 2022-07-26 NOTE — Patient Outreach (Signed)
Transition Care Management Follow-up Telephone Call Date of discharge and from where: 07/23/22 from [facility] How have you been since you were released from the hospital? Somewhat better Any questions or concerns? Yes  Items Reviewed: Did the pt receive and understand the discharge instructions provided? Yes  Medications obtained and verified? Yes  Other? No  Any new allergies since your discharge? No  Dietary orders reviewed? Yes Do you have support at home? Yes   Home Care and Equipment/Supplies: Were home health services ordered? not applicable If so, what is the name of the agency? N/A  Has the agency set up a time to come to the patient's home? not applicable Were any new equipment or medical supplies ordered?  No What is the name of the medical supply agency? N/A Were you able to get the supplies/equipment? not applicable Do you have any questions related to the use of the equipment or supplies? No  Functional Questionnaire: (I = Independent and D = Dependent) ADLs: I  Bathing/Dressing- I  Meal Prep- I  Eating- I  Maintaining continence- I  Transferring/Ambulation- I  Managing Meds- I  Follow up appointments reviewed:  PCP Hospital f/u appt confirmed? Yes  Scheduled to see Derrek Monaco on 07/30/22  @ 130. [facility] f/u appt confirmed? No  No specialist recommended Are transportation arrangements needed? No  If their condition worsens, is the pt aware to call PCP or go to the Emergency Dept.? Yes Was the patient provided with contact information for the PCP's office or ED? Yes Was to pt encouraged to call back with questions or concerns? Yes

## 2022-07-27 ENCOUNTER — Telehealth (INDEPENDENT_AMBULATORY_CARE_PROVIDER_SITE_OTHER): Payer: Medicaid Other | Admitting: Neurology

## 2022-07-27 ENCOUNTER — Encounter: Payer: Self-pay | Admitting: Neurology

## 2022-07-27 VITALS — Ht 62.0 in | Wt 140.0 lb

## 2022-07-27 DIAGNOSIS — F445 Conversion disorder with seizures or convulsions: Secondary | ICD-10-CM | POA: Diagnosis not present

## 2022-07-27 NOTE — Progress Notes (Signed)
Virtual Visit via Video Note The purpose of this virtual visit is to provide medical care while limiting exposure to the novel coronavirus.    Consent was obtained for video visit:  Yes.   Answered questions that patient had about telehealth interaction:  Yes.   I discussed the limitations, risks, security and privacy concerns of performing an evaluation and management service by telemedicine. I also discussed with the patient that there may be a patient responsible charge related to this service. The patient expressed understanding and agreed to proceed.  Pt location: Home Physician Location: office Name of referring provider:  [name], Fransisca Kaufmann, MD I connected with [name] at patients initiation/request on 07/27/2022 at 10:00 AM EST by video enabled telemedicine application and verified that I am speaking with the correct person using two identifiers. Pt MRN:  [id number] Pt DOB:  [birth year] Video Participants:  [name]   History of Present Illness:  The patient had a virtual video visit on 07/27/2022. 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 She is happy to report that everything is going well, she is a lot better. The stress seizures decreased then got a little more before Christmas due to more stress and anxiety at that time. Since the stress level has gone done, the frequency of spells have went down tremendously. She has not had any in 1.5 months. She was switched to Sertraline which is helping her a lot better, she feels normal. She sleeps well and has prn Trazodone. She had a near-fall before Thanksgiving while walking in the house with her husband, she started having a spell and started dropping to the floor, her husband caught her. She states this is only the second time this type of event happened. She denies any headaches, dizziness. She has some hoarseness after having an upper respiratory infection  recently.    History on Initial Assessment 01/04/2022: This is a 37 year old right-handed woman with a history of anxiety, depression, psychogenic non-epileptic events presenting for second opinion. 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 There was an increase in episodes when she was off Celexa. Symptoms started in January 2022, she was in the ER on 08/04/20 for an episode of loss of consciousness, she reported feeling a burning sensation in the back of her head, then lost consciousness. She was back in the ER on 08/17/20 for several episodes of shaking that were witnessed in the ER with no post-event confusion, she was noted to come around with ammonia smelling salts immediately. She was evaluated by neurologist Dr. Krista Blue in 08/2020, she reviewed videos of episodes described as eyes closed, lying down, body relaxed. In one video, she appeared asleep with right leg rotating movement, on another video she was on her left side with left hand shaking for a few minutes, semiology consistent with non-epileptic events. She had a normal wake EEG in 08/2020. 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 She was in the ER in 02/23/21 after she zoned out and began shaking in arms and head for 30 seconds, then again on 02/26/21 when she had one  in the shower and hit her head. She states that episodes usually start with a burning in her legs, usually on the right then moving to the left. Sometimes there is tingling in both arms before she gets the burning feeling. She feels like Jello, like she does not have control over her body. She feels very tired and sore afterward. She reports  the longest event-free period was 2 months, at one point she had 15 in one day. These have quieted down but recently started increasing again, last night she had 3. He reports she may go a day without having one, then would have them back to back. She has new symptoms recently where she can't talk afterwards, she has it in her brain but they come out jumbled. She reports that when she was at her prior job, she would make a breathing hiccup noise during the episodes, she was unaware of it. Initially she could hear what was going on, she could hear herself hit the floor but not feel it. Recently she is unaware of her surroundings unless she is touched and spoken to, then she can start to hear. She notes that stress is a trigger, last night she had an argument with her mother right before the episodes. When she was at her prior job at [employer], "they were radical," she has been at her current job for almost a year in August, she works alone at a drive-thru station and her husband has been called that she had an event. She denies any olfactory/gustatory hallucinations, deja vu, rising epigastric sensation, focal numbness/tingling/weakness. Her husband has noticed brief jerks which she is unaware of, he reports she was talking to our receptionist and having jerking. As she came into the exam room, the jerks increased and he lay her on the examination table. As I entered the room, she was lying on her left side with eyes closed, having irregular asynchronous jerking movements that lasted a few more seconds. No post-event confusion, she was able to speak without issues.   She used to have headaches in 2017 but these resolved. She does not have headaches in general but has one today attributed to hitting her head from the episode last night. She gets dizzy when she first wakes up, it slowly gets better. No diplopia, dysarthria/dysphagia, neck pain, bowel/bladder dysfunction. She reports her father had seizures. Her  twin sister's youngest child has febrile seizures. She had a concussion at age 50 and lost her childhood memories. She had a normal birth and early development.  There is no history of febrile convulsions, CNS infections such as meningitis/encephalitis, significant traumatic brain injury, neurosurgical procedures.   Current Outpatient Medications on File Prior to Visit  Medication Sig Dispense Refill   diclofenac (VOLTAREN) 75 MG EC tablet Take 1 tablet (75 mg total) by mouth 2 (two) times daily. 20 tablet 0   fluticasone (FLONASE) 50 MCG/ACT nasal spray Place 2 sprays into both nostrils daily. 16 g 0   HYDROcodone bit-homatropine (HYCODAN) 5-1.5 MG/5ML syrup Take 5 mLs by mouth every 6 (six) hours as needed for cough. 120 mL 0   sertraline (ZOLOFT) 100 MG tablet Take 1 tablet (100 mg total) by mouth at bedtime. 30 tablet 4   traZODone (DESYREL) 100 MG tablet Take 1-1.5 tablets (100-150 mg total) by mouth as needed for sleep. 45 tablet 3   No current facility-administered medications on file prior to visit.     Observations/Objective:   Vitals:   07/27/22 0910  Weight: 140 lb (63.5 kg)  Height: '5\' 2"'$  (1.575 m)   GEN:  The patient appears stated age and is in NAD.  Neurological examination: Patient is awake, alert. No aphasia or dysarthria. Intact fluency and comprehension. Cranial nerves: Extraocular movements intact. No facial asymmetry. Motor: moves all extremities symmetrically, at least anti-gravity x 4.  Assessment and Plan:   This is a 37 yo RH woman with a history of anxiety, depression, and psychogenic non-epileptic events (PNES). She reports an improvement in symptoms with reducing stress and anxiety levels. Last spell was 1.5 months ago. She has not been able to do CBT yet, but will be busy the next few months at work. She knows to call our office for CBT referral when needed. Follow-up in 6 months, call for any changes.    Follow Up Instructions:   -I discussed the assessment  and treatment plan with the patient. The patient was provided an opportunity to ask questions and all were answered. The patient agreed with the plan and demonstrated an understanding of the instructions.   The patient was advised to call back or seek an in-person evaluation if the symptoms worsen or if the condition fails to improve as anticipated.     Cameron Sprang, MD

## 2022-07-27 NOTE — Patient Instructions (Signed)
Good to hear things are better. Let me know if you would like the referral for CBT. I hope you start feeling better soon. Follow-up in 6 months, call for any changes.

## 2022-07-30 ENCOUNTER — Ambulatory Visit: Payer: Medicaid Other | Admitting: Adult Health

## 2022-08-06 ENCOUNTER — Telehealth: Payer: Medicaid Other | Admitting: Nurse Practitioner

## 2022-08-06 ENCOUNTER — Encounter: Payer: Self-pay | Admitting: Family Medicine

## 2022-08-06 ENCOUNTER — Encounter: Payer: Self-pay | Admitting: Nurse Practitioner

## 2022-08-06 DIAGNOSIS — R0781 Pleurodynia: Secondary | ICD-10-CM | POA: Diagnosis not present

## 2022-08-06 MED ORDER — NAPROXEN 500 MG PO TABS: 500.0000 mg | ORAL_TABLET | Freq: Two times a day (BID) | ORAL | 1 refills | Status: DC

## 2022-08-06 NOTE — Progress Notes (Signed)
Virtual Visit Consent   Jaime Flores, you are scheduled for a virtual visit with Mary-Margaret [name], [place], a [facility] provider, today.     Just as with appointments in the office, your consent must be obtained to participate.  Your consent will be active for this visit and any virtual visit you may have with one of our providers in the next 365 days.     If you have a MyChart account, a copy of this consent can be sent to you electronically.  All virtual visits are billed to your insurance company just like a traditional visit in the office.    As this is a virtual visit, video technology does not allow for your provider to perform a traditional examination.  This may limit your provider's ability to fully assess your condition.  If your provider identifies any concerns that need to be evaluated in person or the need to arrange testing (such as labs, EKG, etc.), we will make arrangements to do so.     Although advances in technology are sophisticated, we cannot ensure that it will always work on either your end or our end.  If the connection with a video visit is poor, the visit may have to be switched to a telephone visit.  With either a video or telephone visit, we are not always able to ensure that we have a secure connection.     I need to obtain your verbal consent now.   Are you willing to proceed with your visit today? YES   TEASHA MURRILLO has provided verbal consent on 08/06/2022 for a virtual visit (video or telephone).   Mary-Margaret [name], FNP   Date: 08/06/2022 8:50 AM   Virtual Visit via Video Note   I, Mary-Margaret [name], connected with Jaime Flores ([id number], Nov 13, 1985) on 08/06/22 at  2:20 PM EST by a video-enabled telemedicine application and verified that I am speaking with the correct person using two identifiers.  Location: Patient: Virtual Visit Location Patient: Home Provider: Virtual Visit Location Provider: Mobile   I discussed the  limitations of evaluation and management by telemedicine and the availability of in person appointments. The patient expressed understanding and agreed to proceed.    History of Present Illness: Jaime Flores is a 37 y.o. who identifies as a female who was assigned female at birth, and is being seen today for rib contusion.  HPI: Patient had bronchitis a few weeks ago. She says that her ribs have been hurting every since. She is fine if she is standing up. Pain is mainly right flank area. Denies sob    ROS  Problems:  Patient Active Problem List   Diagnosis Date Noted   Prediabetes 02/15/2020   Anxiety 11/18/2019   Depression, recurrent ([place]) 11/18/2019   Seasonal and perennial allergic rhinitis 05/15/2019   Amenorrhea 01/27/2013    Allergies:  Allergies  Allergen Reactions   Catfish [Fish Allergy] Shortness Of Breath   Doxycycline Shortness Of Breath   Wellbutrin [Bupropion] Shortness Of Breath, Nausea Only and Palpitations   Tape Swelling    Plastic tape please use paper   Medications:  Current Outpatient Medications:    diclofenac (VOLTAREN) 75 MG EC tablet, Take 1 tablet (75 mg total) by mouth 2 (two) times daily., Disp: 20 tablet, Rfl: 0   fluticasone (FLONASE) 50 MCG/ACT nasal spray, Place 2 sprays into both nostrils daily., Disp: 16 g, Rfl: 0   HYDROcodone bit-homatropine (HYCODAN) 5-1.5 MG/5ML syrup, Take 5 mLs  by mouth every 6 (six) hours as needed for cough., Disp: 120 mL, Rfl: 0   sertraline (ZOLOFT) 100 MG tablet, Take 1 tablet (100 mg total) by mouth at bedtime., Disp: 30 tablet, Rfl: 4   traZODone (DESYREL) 100 MG tablet, Take 1-1.5 tablets (100-150 mg total) by mouth as needed for sleep., Disp: 45 tablet, Rfl: 3  Observations/Objective: Patient is well-developed, well-nourished in no acute distress.  Resting comfortably  at home.  Head is normocephalic, atraumatic.  No labored breathing.  Speech is clear and coherent with logical content.  Patient is alert  and oriented at baseline.  No contusion  Assessment and Plan:  MITTIE KNITTEL in today with chief complaint of rib contusion   1. Rib pain on right side Moist heat Rest RTO prn - naproxen (NAPROSYN) 500 MG tablet; Take 1 tablet (500 mg total) by mouth 2 (two) times daily with a meal.  Dispense: 60 tablet; Refill: 1     Follow Up Instructions: I discussed the assessment and treatment plan with the patient. The patient was provided an opportunity to ask questions and all were answered. The patient agreed with the plan and demonstrated an understanding of the instructions.  A copy of instructions were sent to the patient via MyChart.  The patient was advised to call back or seek an in-person evaluation if the symptoms worsen or if the condition fails to improve as anticipated.  Time:  I spent 5 minutes with the patient via telehealth technology discussing the above problems/concerns.    Mary-Margaret [name], FNP

## 2022-08-08 ENCOUNTER — Telehealth: Payer: Medicaid Other | Admitting: Family Medicine

## 2022-08-09 ENCOUNTER — Encounter: Payer: Self-pay | Admitting: Family Medicine

## 2022-08-09 ENCOUNTER — Telehealth (INDEPENDENT_AMBULATORY_CARE_PROVIDER_SITE_OTHER): Payer: Medicaid Other | Admitting: Family Medicine

## 2022-08-09 DIAGNOSIS — F339 Major depressive disorder, recurrent, unspecified: Secondary | ICD-10-CM

## 2022-08-09 DIAGNOSIS — F419 Anxiety disorder, unspecified: Secondary | ICD-10-CM | POA: Diagnosis not present

## 2022-08-09 MED ORDER — SERTRALINE HCL 100 MG PO TABS: 100.0000 mg | ORAL_TABLET | Freq: Every day | ORAL | 5 refills | Status: DC

## 2022-08-09 MED ORDER — CYCLOBENZAPRINE HCL 10 MG PO TABS: 10.0000 mg | ORAL_TABLET | Freq: Three times a day (TID) | ORAL | 0 refills | Status: DC | PRN

## 2022-08-09 NOTE — Progress Notes (Signed)
Virtual Visit via MyChart video Note  I connected with Jaime Flores on 42/70/62 at 1711 by video and verified that I am speaking with the correct person using two identifiers. Jaime Flores is currently located at home and patient are currently with her during visit. The provider, Fransisca Kaufmann Yolonda Purtle, MD is located in their office at time of visit.  Call ended at 1724  I discussed the limitations, risks, security and privacy concerns of performing an evaluation and management service by video and the availability of in person appointments. I also discussed with the patient that there may be a patient responsible charge related to this service. The patient expressed understanding and agreed to proceed.   History and Present Illness: Patient is calling in for recheck for anxiety and depression. She is currently taking trazodone and sertraline. The Zoloft is working and she has been out for 2 days and lost her bottle and she has been out of it for the past 3 days. She had an argument with her supervisor today. She is trying to move and thinking to quit her job. She says the past few days have been tougher because of the loss of medicine. She denies any suicidal ideations or thoughts of hurting self.   1. Anxiety   2. Depression, recurrent [facility])     Outpatient Encounter Medications as of 08/09/2022  Medication Sig   cyclobenzaprine (FLEXERIL) 10 MG tablet Take 1 tablet (10 mg total) by mouth 3 (three) times daily as needed for muscle spasms.   diclofenac (VOLTAREN) 75 MG EC tablet Take 1 tablet (75 mg total) by mouth 2 (two) times daily.   fluticasone (FLONASE) 50 MCG/ACT nasal spray Place 2 sprays into both nostrils daily.   HYDROcodone bit-homatropine (HYCODAN) 5-1.5 MG/5ML syrup Take 5 mLs by mouth every 6 (six) hours as needed for cough.   naproxen (NAPROSYN) 500 MG tablet Take 1 tablet (500 mg total) by mouth 2 (two) times daily with a meal.   sertraline (ZOLOFT) 100 MG tablet Take  1 tablet (100 mg total) by mouth at bedtime.   traZODone (DESYREL) 100 MG tablet Take 1-1.5 tablets (100-150 mg total) by mouth as needed for sleep.   [DISCONTINUED] sertraline (ZOLOFT) 100 MG tablet Take 1 tablet (100 mg total) by mouth at bedtime.   No facility-administered encounter medications on file as of 08/09/2022.    Review of Systems  Constitutional:  Negative for chills and fever.  Eyes:  Negative for visual disturbance.  Respiratory:  Negative for chest tightness and shortness of breath.   Cardiovascular:  Negative for chest pain and leg swelling.  Musculoskeletal:  Negative for back pain and gait problem.  Skin:  Negative for rash.  Neurological:  Negative for dizziness, light-headedness and headaches.  Psychiatric/Behavioral:  Positive for dysphoric mood and sleep disturbance. Negative for agitation, behavioral problems, self-injury and suicidal ideas. The patient is nervous/anxious.   All other systems reviewed and are negative.   Observations/Objective: Patient sounds comfortable and in no acute distress.   Assessment and Plan: Problem List Items Addressed This Visit       Other   Anxiety - Primary   Relevant Medications   sertraline (ZOLOFT) 100 MG tablet   Depression, recurrent (HCC)   Relevant Medications   sertraline (ZOLOFT) 100 MG tablet    Restart zoloft and keep trazodone  Follow up plan: Return if symptoms worsen or fail to improve, for 2-3 month anxiety and depression. [name]     I  discussed the assessment and treatment plan with the patient. The patient was provided an opportunity to ask questions and all were answered. The patient agreed with the plan and demonstrated an understanding of the instructions.   The patient was advised to call back or seek an in-person evaluation if the symptoms worsen or if the condition fails to improve as anticipated.  The above assessment and management plan was discussed with the patient. The patient verbalized  understanding of and has agreed to the management plan. Patient is aware to call the clinic if symptoms persist or worsen. Patient is aware when to return to the clinic for a follow-up visit. Patient educated on when it is appropriate to go to the emergency department.    I provided 13 minutes of non-face-to-face time during this encounter.    [name], MD

## 2022-08-23 ENCOUNTER — Other Ambulatory Visit: Payer: Medicaid Other | Admitting: Obstetrics and Gynecology

## 2022-08-23 ENCOUNTER — Encounter: Payer: Self-pay | Admitting: Obstetrics and Gynecology

## 2022-08-23 NOTE — Patient Outreach (Signed)
Medicaid Managed Care   Nurse Care Manager Note  03/24/1193 Name:  Jaime Flores MRN:  [id number] DOB:  [birth year]  Jaime Flores is an 37 y.o. year old female who is a primary patient of [name], Fransisca Kaufmann, MD.  The Medicaid Managed Care Coordination team was consulted for assistance with:    Chronic healthcare management needs, anxiety, depression, rhinitis, PNES  Ms. Mikels was given information about Medicaid Managed Care Coordination team services today. [name] Patient agreed to services and verbal consent obtained.  Engaged with patient by telephone for initial visit in response to provider referral for case management and/or care coordination services.   Assessments/Interventions:  Review of past medical history, allergies, medications, health status, including review of consultants reports, laboratory and other test data, was performed as part of comprehensive evaluation and provision of chronic care management services.  SDOH (Social Determinants of Health) assessments and interventions performed: SDOH Interventions    Flowsheet Row Patient Outreach Telephone from 08/23/2022 in [place] Office Visit from 07/18/2022 in [facility] Office Visit from 01/03/2022 in [facility] Office Visit from 05/02/2021 in [facility] Video Visit from 10/13/2020 in [place]  SDOH Interventions       Alcohol Usage Interventions Intervention Not Indicated (Score <7) -- -- -- --  Depression Interventions/Treatment  -- Currently on Treatment Medication, Counseling, Currently on Treatment Currently on Treatment Currently on Treatment  Stress Interventions Other (Comment)  [patient states she is on medication-declined services] -- -- -- --     Care Plan  Allergies  Allergen Reactions   Catfish [Fish Allergy]  Shortness Of Breath   Doxycycline Shortness Of Breath   Wellbutrin [Bupropion] Shortness Of Breath, Nausea Only and Palpitations   Tape Swelling    Plastic tape please use paper   Medications Reviewed Today     Reviewed by [name], RN (Registered Nurse) on 08/23/22 at 1245  Med List Status: <None>   Medication Order Taking? Sig Documenting Provider Last Dose Status Informant  cyclobenzaprine (FLEXERIL) 10 MG tablet [id number]  Take 1 tablet (10 mg total) by mouth 3 (three) times daily as needed for muscle spasms. [name], Fransisca Kaufmann, MD  Active   diclofenac (VOLTAREN) 75 MG EC tablet [id number] No Take 1 tablet (75 mg total) by mouth 2 (two) times daily. Fransico Meadow, PA-C Taking Active   fluticasone (FLONASE) 50 MCG/ACT nasal spray [id number] No Place 2 sprays into both nostrils daily. [name], FNP Taking Active   HYDROcodone bit-homatropine (HYCODAN) 5-1.5 MG/5ML syrup [id number] No Take 5 mLs by mouth every 6 (six) hours as needed for cough. [name], Mary-Margaret, FNP Taking Active   naproxen (NAPROSYN) 500 MG tablet [id number]  Take 1 tablet (500 mg total) by mouth 2 (two) times daily with a meal. [name], Mary-Margaret, FNP  Active   sertraline (ZOLOFT) 100 MG tablet [id number]  Take 1 tablet (100 mg total) by mouth at bedtime. [name], Fransisca Kaufmann, MD  Active   traZODone (DESYREL) 100 MG tablet [id number] No Take 1-1.5 tablets (100-150 mg total) by mouth as needed for sleep. [name], Fransisca Kaufmann, MD Taking Active            Patient Active Problem List   Diagnosis Date Noted   Prediabetes 02/15/2020   Anxiety 11/18/2019   Depression, recurrent ([place]) 11/18/2019   Seasonal and perennial allergic rhinitis 05/15/2019  Amenorrhea 01/27/2013   Conditions to be addressed/monitored per PCP order:  Chronic healthcare management needs, anxiety, depression, rhinitis, PNES  Care Plan : RN Care Manager Plan of Care  Updates made by [name], RN since 08/23/2022 12:00 AM      Problem: Health Promotion or Disease Self-Management (General Plan of Care)      Long-Range Goal: Chronic Disease Management   Start Date: 08/23/2022  Expected End Date: 11/21/2022  Priority: High  Note:   Current Barriers:  Knowledge Deficits related to plan of care for management of Anxiety, Depression, allergic rhinitis, and PNES  Chronic Disease Management support and education needs related to Anxiety, Depression, Allergic Rhinitis, and PNES   RNCM Clinical Goal(s):  Patient will verbalize understanding of plan for management of Anxiety, Depression, Allergic Rhinitis, and PNES as evidenced by patient report verbalize basic understanding of  Anxiety, Depression, Allergic Rhinitis, and PNES disease process and self health management plan as evidenced by patient report take all medications exactly as prescribed and will call provider for medication related questions as evidenced by patient report demonstrate understanding of rationale for each prescribed medication as evidenced by patient report attend all scheduled medical appointments:as evidenced by patient report and EMR review demonstrate Ongoing adherence to prescribed treatment plan for Anxiety, Depression, Allergic Rhinitis, and PNES as evidenced by patient report and EMR review continue to work with RN Care Manager to address care management and care coordination needs related to  Anxiety, Depression, Allergic Rhinitis, and PNES as evidenced by adherence to CM Team Scheduled appointments through collaboration with RN Care manager, provider, and care team.   Interventions: Inter-disciplinary care team collaboration (see longitudinal plan of care) Evaluation of current treatment plan related to  self management and patient's adherence to plan as established by provider    (Status:  New goal.)  Long Term Goal Evaluation of current treatment plan related to Anxiety, Depression, Allergic Rhinitis, and PNES , self-management and  patient's adherence to plan as established by provider. Discussed plans with patient for ongoing care management follow up and provided patient with direct contact information for care management team Evaluation of current treatment plan related to anxiety, depression, rhinitis, PNES  and patient's adherence to plan as established by provider Reviewed medications with patient Reviewed scheduled/upcoming provider appointments  Discussed plans with patient for ongoing care management follow up and provided patient with direct contact information for care management team Assessed social determinant of health barriers  Patient Goals/Self-Care Activities: Take all medications as prescribed Attend all scheduled provider appointments Call pharmacy for medication refills 3-7 days in advance of running out of medications Perform all self care activities independently  Perform IADL's (shopping, preparing meals, housekeeping, managing finances) independently Call provider office for new concerns or questions   Follow Up Plan:  The patient has been provided with contact information for the care management team and has been advised to call with any health related questions or concerns.  The care management team will reach out to the patient again over the next 30 business  days.    Long-Range Goal: Establish Plan of Care for Chronic Disease Management Needs   Start Date: 08/23/2022  Expected End Date: 11/21/2022  Priority: High  Note:   Timeframe:  Long-Range Goal Priority:  High Start Date: 08/23/22                            Expected End Date: ongoing  Follow Up Date 09/21/22    - practice safe sex - schedule appointment for flu shot - schedule appointment for vaccines needed due to my age or health - schedule recommended health tests (blood work, mammogram, colonoscopy, pap test) - schedule and keep appointment for annual check-up    Why is this important?   Screening tests  can find diseases early when they are easier to treat.  Your doctor or nurse will talk with you about which tests are important for you.  Getting shots for common diseases like the flu and shingles will help prevent them.    Follow Up:  Patient agrees to Care Plan and Follow-up.  Plan: The Managed Medicaid care management team will reach out to the patient again over the next 30 business  days. and The  Patient has been provided with contact information for the Managed Medicaid care management team and has been advised to call with any health related questions or concerns.  Date/time of next scheduled RN care management/care coordination outreach:  09/24/22 at 1230.

## 2022-08-23 NOTE — Patient Instructions (Signed)
Hi Jaime Flores, thanks for speaking with me today-have a nice afternoon!  Jaime Flores was given information about Medicaid Managed Care team care coordination services as a part of their Healthy [facility] Medicaid benefit. Jaime Flores verbally consented to engagement with the [facility] Managed Care team.   If you are experiencing a medical emergency, please call 911 or report to your local emergency department or urgent care.   If you have a non-emergency medical problem during routine business hours, please contact your provider's office and ask to speak with a nurse.   For questions related to your Healthy [facility] health plan, please call: [phone] or visit the homepage here: [email or web address]  If you would like to schedule transportation through your Healthy [facility] plan, please call the following number at least 2 days in advance of your appointment: [phone]  For information about your ride after you set it up, call Ride Assist at [phone]. Use this number to activate a Will Call pickup, or if your transportation is late for a scheduled pickup. Use this number, too, if you need to make a change or cancel a previously scheduled reservation.  If you need transportation services right away, call [phone]. The after-hours call center is staffed 24 hours to handle ride assistance and urgent reservation requests (including discharges) 365 days a year. Urgent trips include sick visits, hospital discharge requests and life-sustaining treatment.  Call the [place] at [phone], at any time, 24 hours a day, 7 days a week. If you are in danger or need immediate medical attention call 911.  If you would like help to quit smoking, call 1-800-QUIT-NOW 715 816 4980) OR Espaol: 1-855-Djelo-Ya (1-448-185-6314) o para ms informacin haga clic aqu or Text READY to 200-400 to register via text  Jaime Flores -  following are the goals we discussed in your visit today:   Goals Addressed    Timeframe:  Long-Range Goal Priority:  High Start Date: 08/23/22                            Expected End Date: ongoing                      Follow Up Date 09/21/22    - practice safe sex - schedule appointment for flu shot - schedule appointment for vaccines needed due to my age or health - schedule recommended health tests (blood work, mammogram, colonoscopy, pap test) - schedule and keep appointment for annual check-up    Why is this important?   Screening tests can find diseases early when they are easier to treat.  Your doctor or nurse will talk with you about which tests are important for you.  Getting shots for common diseases like the flu and shingles will help prevent them.   Patient verbalizes understanding of instructions and care plan provided today and agrees to view in [place]. Active MyChart status and patient understanding of how to access instructions and care plan via MyChart confirmed with patient.     The Managed Medicaid care management team will reach out to the patient again over the next 30 business  days.  The  Patient  has been provided with contact information for the Managed Medicaid care management team and has been advised to call with any health related questions or concerns.   Jaime Raider RN, BSN [place]  [facility] - Managed Medicaid High Risk  [phone]   Following is a copy of your plan of care:  Care Plan : [place] of Care  Updates made by Jaime Medicus, RN since 08/23/2022 12:00 AM     Problem: Health Promotion or Disease Self-Management (General Plan of Care)      Long-Range Goal: Chronic Disease Management   Start Date: 08/23/2022  Expected End Date: 11/21/2022  Priority: High  Note:   Current Barriers:  Knowledge Deficits related to plan of care for management of Anxiety, Depression, allergic rhinitis,  and PNES  Chronic Disease Management support and education needs related to Anxiety, Depression, Allergic Rhinitis, and PNES   RNCM Clinical Goal(s):  Patient will verbalize understanding of plan for management of Anxiety, Depression, Allergic Rhinitis, and PNES as evidenced by patient report verbalize basic understanding of  Anxiety, Depression, Allergic Rhinitis, and PNES disease process and self health management plan as evidenced by patient report take all medications exactly as prescribed and will call provider for medication related questions as evidenced by patient report demonstrate understanding of rationale for each prescribed medication as evidenced by patient report attend all scheduled medical appointments as evidenced by patient report and EMR review demonstrate Ongoing adherence to prescribed treatment plan for Anxiety, Depression, Allergic Rhinitis, and PNES as evidenced by patient report and EMR review continue to work with RN Care Manager to address care management and care coordination needs related to  Anxiety, Depression, Allergic Rhinitis, and PNES as evidenced by adherence to CM Team Scheduled appointments through collaboration with RN Care manager, provider, and care team.   Interventions: Inter-disciplinary care team collaboration (see longitudinal plan of care) Evaluation of current treatment plan related to  self management and patient's adherence to plan as established by provider    (Status:  New goal.)  Long Term Goal Evaluation of current treatment plan related to Anxiety, Depression, Allergic Rhinitis, and PNES , self-management and patient's adherence to plan as established by provider. Discussed plans with patient for ongoing care management follow up and provided patient with direct contact information for care management team Evaluation of current treatment plan related to anxiety, depression, rhinitis, PNES  and patient's adherence to plan as established by  provider Reviewed medications with patient Reviewed scheduled/upcoming provider appointments  Discussed plans with patient for ongoing care management follow up and provided patient with direct contact information for care management team Assessed social determinant of health barriers  Patient Goals/Self-Care Activities: Take all medications as prescribed Attend all scheduled provider appointments Call pharmacy for medication refills 3-7 days in advance of running out of medications Perform all self care activities independently  Perform IADL's (shopping, preparing meals, housekeeping, managing finances) independently Call provider office for new concerns or questions   Follow Up Plan:  The patient has been provided with contact information for the care management team and has been advised to call with any health related questions or concerns.  The care management team will reach out to the patient again over the next 30 business  days.

## 2022-09-05 ENCOUNTER — Ambulatory Visit: Payer: Medicaid Other | Admitting: Adult Health

## 2022-09-07 ENCOUNTER — Ambulatory Visit: Payer: Medicaid Other | Admitting: Family Medicine

## 2022-09-10 ENCOUNTER — Ambulatory Visit (INDEPENDENT_AMBULATORY_CARE_PROVIDER_SITE_OTHER): Payer: Medicaid Other

## 2022-09-10 ENCOUNTER — Ambulatory Visit
Admission: EM | Admit: 2022-09-10 | Discharge: 2022-09-10 | Disposition: A | Discharge status: Home / Self Care | Payer: Medicaid Other | Attending: Family Medicine | Admitting: Family Medicine

## 2022-09-10 DIAGNOSIS — R0781 Pleurodynia: Secondary | ICD-10-CM | POA: Diagnosis not present

## 2022-09-10 DIAGNOSIS — R0782 Intercostal pain: Secondary | ICD-10-CM | POA: Diagnosis not present

## 2022-09-10 NOTE — ED Triage Notes (Signed)
Patient is having right side rib pain that started 3 weeks ago, patient states she felt a pop in her right rib area and has been having pain there ever since. Taking flexeril and hasn't help the pain.

## 2022-09-10 NOTE — ED Provider Notes (Signed)
[facility]    CSN: [id number] Arrival date & time: 09/10/22  [id number]      History   Chief Complaint Chief Complaint  Patient presents with   Rib Injury    HPI Jaime Flores is a 37 y.o. female.   Patient presenting today with right lateral rib pain that started about 3 weeks ago.  She states when she made a movement at that time she felt a pop in this area and has been having the pain ever since.  The pain is worse with movement, deep breaths.  Denies bruising, swelling, shortness of breath, radiation of pain.  Taking Flexeril with minimal relief of pain.    Past Medical History:  Diagnosis Date   Amenorrhea 01/27/2013   Angio-edema    Breast pain 03/10/2015   Encounter for menstrual regulation 07/05/2015   History of pseudoseizure    Irregular intermenstrual bleeding 07/28/2015   Irregular periods 07/05/2015   Screening for STD (sexually transmitted disease) 08/07/2013   Urticaria    UTI (urinary tract infection)    Vaginal discharge 06/10/2013    Patient Active Problem List   Diagnosis Date Noted   Prediabetes 02/15/2020   Anxiety 11/18/2019   Depression, recurrent ([place]) 11/18/2019   Seasonal and perennial allergic rhinitis 05/15/2019   Amenorrhea 01/27/2013    Past Surgical History:  Procedure Laterality Date   APPENDECTOMY     CHOLECYSTECTOMY     TUBAL LIGATION      OB History     Gravida  2   Para  2   Term  1   Preterm  1   AB      Living  2      SAB      IAB      Ectopic      Multiple      Live Births               Home Medications    Prior to Admission medications   Medication Sig Start Date End Date Taking? Authorizing Provider  cyclobenzaprine (FLEXERIL) 10 MG tablet Take 1 tablet (10 mg total) by mouth 3 (three) times daily as needed for muscle spasms. 08/09/22  Yes [name], Fransisca Kaufmann, MD  naproxen (NAPROSYN) 500 MG tablet Take 1 tablet (500 mg total) by mouth 2 (two) times daily with a meal. 08/06/22   Yes [name], Mary-Margaret, FNP  sertraline (ZOLOFT) 100 MG tablet Take 1 tablet (100 mg total) by mouth at bedtime. 08/09/22  Yes [name], Fransisca Kaufmann, MD  traZODone (DESYREL) 100 MG tablet Take 1-1.5 tablets (100-150 mg total) by mouth as needed for sleep. 06/08/22  Yes [name], Fransisca Kaufmann, MD  diclofenac (VOLTAREN) 75 MG EC tablet Take 1 tablet (75 mg total) by mouth 2 (two) times daily. 07/23/22   Fransico Meadow, PA-C  fluticasone (FLONASE) 50 MCG/ACT nasal spray Place 2 sprays into both nostrils daily. 07/10/22   [name], FNP  HYDROcodone bit-homatropine (HYCODAN) 5-1.5 MG/5ML syrup Take 5 mLs by mouth every 6 (six) hours as needed for cough. 07/18/22   Chevis Pretty, FNP    Family History Family History  Problem Relation Age of Onset   Autism Son    Diabetes Maternal Grandmother    Stroke Maternal Grandmother    Heart failure Maternal Grandmother    Stroke Maternal Grandfather    Stroke Father    Seizures Father    Allergies Father        penicillin  Heart attack Father    Other Mother        herpes   Hyperlipidemia Mother    Heart failure Paternal Grandfather    Diabetes Paternal Grandmother    Hypertension Paternal Grandmother    Autism Brother    Other Paternal Uncle        heart exploded   Cancer Maternal Aunt    Diabetes Maternal Aunt    Hypertension Maternal Aunt    Immunodeficiency Neg Hx    Urticaria Neg Hx    Eczema Neg Hx    Atopy Neg Hx    Asthma Neg Hx    Angioedema Neg Hx    Allergic rhinitis Neg Hx     Social History Social History   Tobacco Use   Smoking status: Every Day    Packs/day: 0.50    Types: Cigarettes   Smokeless tobacco: Never  Vaping Use   Vaping Use: Never used  Substance Use Topics   Alcohol use: No   Drug use: No     Allergies   Catfish [fish allergy], Doxycycline, Wellbutrin [bupropion], and Tape   Review of Systems Review of Systems Per HPI  Physical Exam Triage Vital Signs ED Triage Vitals  Enc  Vitals Group     BP 09/10/22 1002 126/75     Pulse Rate 09/10/22 1002 70     Resp 09/10/22 1002 18     Temp 09/10/22 1002 98 F (36.7 C)     Temp Source 09/10/22 1002 Oral     SpO2 09/10/22 1002 96 %     Weight --      Height --      Head Circumference --      Peak Flow --      Pain Score 09/10/22 1000 5     Pain Loc --      Pain Edu? --      Excl. in [place]? --    No data found.  Updated Vital Signs BP 126/75 (BP Location: Right Arm)   Pulse 70   Temp 98 F (36.7 C) (Oral)   Resp 18   LMP 08/23/2022 (Exact Date)   SpO2 96%   Visual Acuity Right Eye Distance:   Left Eye Distance:   Bilateral Distance:    Right Eye Near:   Left Eye Near:    Bilateral Near:     Physical Exam Vitals and nursing note reviewed.  Constitutional:      Appearance: Normal appearance. She is not ill-appearing.  HENT:     Head: Atraumatic.  Eyes:     Extraocular Movements: Extraocular movements intact.     Conjunctiva/sclera: Conjunctivae normal.  Cardiovascular:     Rate and Rhythm: Normal rate and regular rhythm.     Heart sounds: Normal heart sounds.  Pulmonary:     Effort: Pulmonary effort is normal. No respiratory distress.     Breath sounds: Normal breath sounds. No wheezing.     Comments: Chest rise symmetric bilaterally Musculoskeletal:        General: Tenderness present. No swelling or deformity. Normal range of motion.     Cervical back: Normal range of motion and neck supple.     Comments: Point tenderness to the right lateral ribs, no bony deformity palpable, no edema, no bruising  Skin:    General: Skin is warm and dry.  Neurological:     Mental Status: She is alert and oriented to person, place, and time.  Psychiatric:  Mood and Affect: Mood normal.        Thought Content: Thought content normal.        Judgment: Judgment normal.      UC Treatments / Results  Labs (all labs ordered are listed, but only abnormal results are displayed) Labs Reviewed - No data  to display  EKG   Radiology DG Ribs Unilateral W/Chest Right  Result Date: 09/10/2022 CLINICAL DATA:  Right rib pain for 3 weeks EXAM: RIGHT RIBS AND CHEST - 3+ VIEW COMPARISON:  07/23/2022 FINDINGS: No fracture or other bone lesions are seen involving the right ribs. There is no evidence of pneumothorax or pleural effusion. Both lungs are clear. Heart size and mediastinal contours are within normal limits. IMPRESSION: Negative. Electronically Signed   By: Davina Poke D.O.   On: 09/10/2022 11:17    Procedures Procedures (including critical care time)  Medications Ordered in UC Medications - No data to display  Initial Impression / Assessment and Plan / UC Course  I have reviewed the triage vital signs and the nursing notes.  Pertinent labs & imaging results that were available during my care of the patient were reviewed by me and considered in my medical decision making (see chart for details).     Vital signs and exam overall very reassuring today, chest x-ray with right rib negative for any acute bony abnormality.  Suspect muscular strain to the rib region.  Continue Flexeril which she states she has plenty at home, heat, massage, stretches, limit heavy lifting.  Return for worsening symptoms.  Final Clinical Impressions(s) / UC Diagnoses   Final diagnoses:  Rib pain on right side   Discharge Instructions   None    ED Prescriptions   None    PDMP not reviewed this encounter.   Volney American, [place] 09/10/22 1220

## 2022-09-24 ENCOUNTER — Other Ambulatory Visit: Payer: Medicaid Other | Admitting: Obstetrics and Gynecology

## 2022-09-24 ENCOUNTER — Encounter: Payer: Self-pay | Admitting: Obstetrics and Gynecology

## 2022-09-24 NOTE — Patient Instructions (Signed)
Hi Jaime Flores, thanks for speaking with me-have a nice afternoon!  Jaime Flores was given information about Medicaid Managed Care team care coordination services as a part of their Healthy [facility] Medicaid benefit. Jaime Flores verbally consented to engagement with the [facility] Managed Care team.   If you are experiencing a medical emergency, please call 911 or report to your local emergency department or urgent care.   If you have a non-emergency medical problem during routine business hours, please contact your provider's office and ask to speak with a nurse.   For questions related to your Healthy [facility] health plan, please call: [phone] or visit the homepage here: [email or web address]  If you would like to schedule transportation through your Healthy [facility] plan, please call the following number at least 2 days in advance of your appointment: [phone]  For information about your ride after you set it up, call Ride Assist at [phone]. Use this number to activate a Will Call pickup, or if your transportation is late for a scheduled pickup. Use this number, too, if you need to make a change or cancel a previously scheduled reservation.  If you need transportation services right away, call [phone]. The after-hours call center is staffed 24 hours to handle ride assistance and urgent reservation requests (including discharges) 365 days a year. Urgent trips include sick visits, hospital discharge requests and life-sustaining treatment.  Call the [place] at [phone], at any time, 24 hours a day, 7 days a week. If you are in danger or need immediate medical attention call 911.  If you would like help to quit smoking, call 1-800-QUIT-NOW 806-389-9593) OR Espaol: 1-855-Djelo-Ya HD:1601594) o para ms informacin haga clic aqu or Text READY to 200-400 to register via text  Jaime Flores - following  are the goals we discussed in your visit today:   Goals Addressed    Timeframe:  Long-Range Goal Priority:  High Start Date: 08/23/22                            Expected End Date: ongoing                      Follow Up Date 10/19/22   - practice safe sex - schedule appointment for flu shot - schedule appointment for vaccines needed due to my age or health - schedule recommended health tests (blood work, mammogram, colonoscopy, pap test) - schedule and keep appointment for annual check-up    Why is this important?   Screening tests can find diseases early when they are easier to treat.  Your doctor or nurse will talk with you about which tests are important for you.  Getting shots for common diseases like the flu and shingles will help prevent them.  09/24/22:  patient has PCP appt 3/6.  Patient verbalizes understanding of instructions and care plan provided today and agrees to view in [place]. Active MyChart status and patient understanding of how to access instructions and care plan via MyChart confirmed with patient.     The Managed Medicaid care management team will reach out to the patient again over the next 30 business  days.  The  Patient  has been provided with contact information for the Managed Medicaid care management team and has been advised to call with any health related questions or concerns.   Jaime Raider RN, BSN   [facility] -  Managed Medicaid High Risk [phone]   Following is a copy of your plan of care:  Care Plan : [place] of Care  Updates made by Jaime Medicus, RN since 09/24/2022 12:00 AM     Problem: Health Promotion or Disease Self-Management (General Plan of Care)      Long-Range Goal: Chronic Disease Management   Start Date: 08/23/2022  Expected End Date: 11/21/2022  Priority: High  Note:   Current Barriers:  Knowledge Deficits related to plan of care for management of Anxiety,  Depression, allergic rhinitis, and PNES, tobacco use  Chronic Disease Management support and education needs related to Anxiety, Depression, Allergic Rhinitis, and PNES, tobacco use  09/24/22:  Patient states seizure once a week-increases with stress and is followed by NEURO.  Smokes 1/2 ppd and is not interested in stopping right now.  Patient declines LCSW-patient states she is managed with meds prescribed by PCP-has appt 3/6.  RNCM Clinical Goal(s):  Patient will verbalize understanding of plan for management of Anxiety, Depression, Allergic Rhinitis, and PNES, tobacco use  as evidenced by patient report verbalize basic understanding of  Anxiety, Depression, Allergic Rhinitis, and PNES, tobacco use  disease process and self health management plan as evidenced by patient report take all medications exactly as prescribed and will call provider for medication related questions as evidenced by patient report demonstrate understanding of rationale for each prescribed medication as evidenced by patient report attend all scheduled medical appointments as evidenced by patient report and EMR review demonstrate Ongoing adherence to prescribed treatment plan for Anxiety, Depression, Allergic Rhinitis, and PNES, tobacco use as evidenced by patient report and EMR review continue to work with RN Care Manager to address care management and care coordination needs related to  Anxiety, Depression, Allergic Rhinitis, and PNES as evidenced by adherence to CM Team Scheduled appointments through collaboration with RN Care manager, provider, and care team.   Interventions: Inter-disciplinary care team collaboration (see longitudinal plan of care) Evaluation of current treatment plan related to  self management and patient's adherence to plan as established by provider  Smoking Cessation Interventions:  (Status:  New goal.) Long Term Goal Reviewed smoking history:  ; currently smoking 1/2 ppd On a scale of 1-10,  reports MOTIVATION to quit is 1 On a scale of 1-10, reports CONFIDENCE in quitting is 1 09/24/22:  Patient is not interested in stopping right now.  Evaluation of current treatment plan reviewed Advised patient to discuss smoking cessation options with provider Provided contact information for [place] Quit Line (1-800-QUIT-NOW) Discussed plans with patient for ongoing care management follow up and provided patient with direct contact information for care management team Assessed social determinant of health barriers     (Status:  New goal.)  Long Term Goal Evaluation of current treatment plan related to Anxiety, Depression, Allergic Rhinitis, and PNES , self-management and patient's adherence to plan as established by provider. Discussed plans with patient for ongoing care management follow up and provided patient with direct contact information for care management team Evaluation of current treatment plan related to anxiety, depression, rhinitis, PNES  and patient's adherence to plan as established by provider Reviewed medications with patient Reviewed scheduled/upcoming provider appointments  Discussed plans with patient for ongoing care management follow up and provided patient with direct contact information for care management team Assessed social determinant of health barriers  Patient Goals/Self-Care Activities: Take all medications as prescribed Attend all scheduled provider appointments Call pharmacy for medication refills 3-7 days in  advance of running out of medications Perform all self care activities independently  Perform IADL's (shopping, preparing meals, housekeeping, managing finances) independently Call provider office for new concerns or questions   Follow Up Plan:  The patient has been provided with contact information for the care management team and has been advised to call with any health related questions or concerns.  The care management team will reach out to the  patient again over the next 30 business  days.

## 2022-09-24 NOTE — Patient Outreach (Signed)
Medicaid Managed Care   Nurse Care Manager Note  0000000 Name:  Jaime Flores MRN:  [id number] DOB:  [birth year]  Jaime Flores is an 37 y.o. year old female who is a primary patient of [name], Fransisca Kaufmann, MD.  The [facility] Managed Care Coordination team was consulted for assistance with:    Chronic healthcare management needs, anxiety/depression, tobacco use, rhinitis, PNES,  Jaime Flores was given information about Medicaid Managed Care Coordination team services today. Jaime Flores Patient agreed to services and verbal consent obtained.  Engaged with patient by telephone for follow up visit in response to provider referral for case management and/or care coordination services.   Assessments/Interventions:  Review of past medical history, allergies, medications, health status, including review of consultants reports, laboratory and other test data, was performed as part of comprehensive evaluation and provision of chronic care management services.  SDOH (Social Determinants of Health) assessments and interventions performed: SDOH Interventions    Flowsheet Row Patient Outreach Telephone from 09/24/2022 in [place] Patient Outreach Telephone from 08/23/2022 in [place] Office Visit from 07/18/2022 in [facility] Office Visit from 01/03/2022 in [facility] Office Visit from 05/02/2021 in [facility] Video Visit from 10/13/2020 in [facility]  SDOH Interventions        Alcohol Usage Interventions -- Intervention Not Indicated (Score <7) -- -- -- --  Depression Interventions/Treatment  -- -- Currently on Treatment Medication, Counseling, Currently on Treatment Currently on Treatment Currently on Treatment  Physical Activity Interventions Intervention Not Indicated -- -- -- -- --  Stress  Interventions -- Other (Comment)  [patient states she is on medication-declined services] -- -- -- --     Care Plan  Allergies  Allergen Reactions   Catfish [Fish Allergy] Shortness Of Breath   Doxycycline Shortness Of Breath   Wellbutrin [Bupropion] Shortness Of Breath, Nausea Only and Palpitations   Tape Swelling    Plastic tape please use paper    Medications Reviewed Today     Reviewed by [name], RN (Registered Nurse) on 09/24/22 at 1253  Med List Status: <None>   Medication Order Taking? Sig Documenting Provider Last Dose Status Informant  cyclobenzaprine (FLEXERIL) 10 MG tablet [id number]  Take 1 tablet (10 mg total) by mouth 3 (three) times daily as needed for muscle spasms. [name], Fransisca Kaufmann, MD  Active   diclofenac (VOLTAREN) 75 MG EC tablet [id number]  Take 1 tablet (75 mg total) by mouth 2 (two) times daily. Caryl Ada K, PA-C  Active   fluticasone [facility]) 50 MCG/ACT nasal spray [id number]  Place 2 sprays into both nostrils daily. [name], FNP  Active   HYDROcodone bit-homatropine [facility]) 5-1.5 MG/5ML syrup [id number]  Take 5 mLs by mouth every 6 (six) hours as needed for cough. [name], Mary-Margaret, FNP  Active   naproxen (NAPROSYN) 500 MG tablet [id number]  Take 1 tablet (500 mg total) by mouth 2 (two) times daily with a meal. [name], Mary-Margaret, FNP  Active   sertraline (ZOLOFT) 100 MG tablet [id number]  Take 1 tablet (100 mg total) by mouth at bedtime. [name], Fransisca Kaufmann, MD  Active   traZODone (DESYREL) 100 MG tablet [id number]  Take 1-1.5 tablets (100-150 mg total) by mouth as needed for sleep. [name], Fransisca Kaufmann, MD  Active            Patient Active Problem  List   Diagnosis Date Noted   Prediabetes 02/15/2020   Anxiety 11/18/2019   Depression, recurrent ([place]) 11/18/2019   Seasonal and perennial allergic rhinitis 05/15/2019   Amenorrhea 01/27/2013   Conditions to be addressed/monitored per PCP order:  Chronic healthcare management needs,  anxiety/depression, tobacco use, rhinitis, PNES,  Care Plan : RN Care Manager Plan of Care  Updates made by [name], RN since 09/24/2022 12:00 AM     Problem: Health Promotion or Disease Self-Management (General Plan of Care)      Long-Range Goal: Chronic Disease Management   Start Date: 08/23/2022  Expected End Date: 11/21/2022  Priority: High  Note:   Current Barriers:  Knowledge Deficits related to plan of care for management of Anxiety, Depression, allergic rhinitis, and PNES, tobacco use  Chronic Disease Management support and education needs related to Anxiety, Depression, Allergic Rhinitis, and PNES, tobacco use  09/24/22:  Patient states seizure once a week-increases with stress and is followed by NEURO.  Smokes 1/2 ppd and is not interested in stopping right now.  Patient declines LCSW-patient states she is managed with meds prescribed by PCP-has appt 3/6.  RNCM Clinical Goal(s):  Patient will verbalize understanding of plan for management of Anxiety, Depression, Allergic Rhinitis, and PNES, tobacco use  as evidenced by patient report verbalize basic understanding of  Anxiety, Depression, Allergic Rhinitis, and PNES, tobacco use  disease process and self health management plan as evidenced by patient report take all medications exactly as prescribed and will call provider for medication related questions as evidenced by patient report demonstrate understanding of rationale for each prescribed medication as evidenced by patient report attend all scheduled medical appointments as evidenced by patient report and EMR review demonstrate Ongoing adherence to prescribed treatment plan for Anxiety, Depression, Allergic Rhinitis, and PNES, tobacco use as evidenced by patient report and EMR review continue to work with RN Care Manager to address care management and care coordination needs related to  Anxiety, Depression, Allergic Rhinitis, and PNES as evidenced by adherence to CM Team  Scheduled appointments through collaboration with RN Care manager, provider, and care team.   Interventions: Inter-disciplinary care team collaboration (see longitudinal plan of care) Evaluation of current treatment plan related to  self management and patient's adherence to plan as established by provider  Smoking Cessation Interventions:  (Status:  New goal.) Long Term Goal Reviewed smoking history:  ; currently smoking 1/2 ppd On a scale of 1-10, reports MOTIVATION to quit is 1 On a scale of 1-10, reports CONFIDENCE in quitting is 1 09/24/22:  Patient is not interested in stopping right now.  Evaluation of current treatment plan reviewed Advised patient to discuss smoking cessation options with provider Provided contact information for [place] Quit Line (1-800-QUIT-NOW) Discussed plans with patient for ongoing care management follow up and provided patient with direct contact information for care management team Assessed social determinant of health barriers     (Status:  New goal.)  Long Term Goal Evaluation of current treatment plan related to Anxiety, Depression, Allergic Rhinitis, and PNES , self-management and patient's adherence to plan as established by provider. Discussed plans with patient for ongoing care management follow up and provided patient with direct contact information for care management team Evaluation of current treatment plan related to anxiety, depression, rhinitis, PNES  and patient's adherence to plan as established by provider Reviewed medications with patient Reviewed scheduled/upcoming provider appointments  Discussed plans with patient for ongoing care management follow up and provided patient with  direct contact information for care management team Assessed social determinant of health barriers  Patient Goals/Self-Care Activities: Take all medications as prescribed Attend all scheduled provider appointments Call pharmacy for medication refills 3-7 days in  advance of running out of medications Perform all self care activities independently  Perform IADL's (shopping, preparing meals, housekeeping, managing finances) independently Call provider office for new concerns or questions   Follow Up Plan:  The patient has been provided with contact information for the care management team and has been advised to call with any health related questions or concerns.  The care management team will reach out to the patient again over the next 30 business  days.    Long-Range Goal: Establish Plan of Care for Chronic Disease Management Needs   Priority: High  Note:   Timeframe:  Long-Range Goal Priority:  High Start Date: 08/23/22                            Expected End Date: ongoing                      Follow Up Date 10/19/22   - practice safe sex - schedule appointment for flu shot - schedule appointment for vaccines needed due to my age or health - schedule recommended health tests (blood work, mammogram, colonoscopy, pap test) - schedule and keep appointment for annual check-up    Why is this important?   Screening tests can find diseases early when they are easier to treat.  Your doctor or nurse will talk with you about which tests are important for you.  Getting shots for common diseases like the flu and shingles will help prevent them.  09/24/22:  patient has PCP appt 3/6.   Follow Up:  Patient agrees to Care Plan and Follow-up.  Plan: The Managed Medicaid care management team will reach out to the patient again over the next 30 business  days. and The  Patient has been provided with contact information for the Managed Medicaid care management team and has been advised to call with any health related questions or concerns.  Date/time of next scheduled RN care management/care coordination outreach: 10/19/22 at 230

## 2022-09-26 ENCOUNTER — Encounter: Payer: Self-pay | Admitting: Family Medicine

## 2022-09-26 ENCOUNTER — Ambulatory Visit: Payer: Medicaid Other | Admitting: Family Medicine

## 2022-09-26 VITALS — BP 103/67 | HR 73 | Ht 62.0 in | Wt 153.0 lb

## 2022-09-26 DIAGNOSIS — F339 Major depressive disorder, recurrent, unspecified: Secondary | ICD-10-CM

## 2022-09-26 DIAGNOSIS — F419 Anxiety disorder, unspecified: Secondary | ICD-10-CM

## 2022-09-26 MED ORDER — HYDROXYZINE PAMOATE 25 MG PO CAPS: 25.0000 mg | ORAL_CAPSULE | Freq: Three times a day (TID) | ORAL | 2 refills | Status: DC | PRN

## 2022-09-26 MED ORDER — SERTRALINE HCL 100 MG PO TABS: 150.0000 mg | ORAL_TABLET | Freq: Every day | ORAL | 5 refills | Status: DC

## 2022-09-26 NOTE — Progress Notes (Signed)
BP 103/67   Pulse 73   Ht '5\' 2"'$  (1.575 m)   Wt 153 lb (69.4 kg)   LMP 08/23/2022 (Exact Date)   SpO2 95%   BMI 27.98 kg/m    Subjective:   Patient ID: Jaime Flores, female    DOB: 10-28-1985, 37 y.o.   MRN: [id number]  HPI: Jaime Flores is a 37 y.o. female presenting on 09/26/2022 for Medical Management of Chronic Issues and Anxiety (One month follow up)   HPI Anxiety recheck Patient is coming in today for anxiety recheck.  She currently uses Zoloft and trazodone.  Patient is coming in today for anxiety recheck and feels like she is not doing as well.  She says she has been having more pseudoseizures she had about 1 last night and she has been having about 2-3 times a week over the past month.  She does say that 3 of her husband's children went back with her mom and their mom is not doing well so she lost the kids and they are now in foster care and they are trying to get that back.  She denies any suicidal ideations or thoughts of herself.  She has been taking Zoloft and does like the Zoloft.  She says trazodone is not really helping as much Flexeril at night to help sleep.  Relevant past medical, surgical, family and social history reviewed and updated as indicated. Interim medical history since our last visit reviewed. Allergies and medications reviewed and updated.  Review of Systems  Constitutional:  Negative for chills and fever.  Eyes:  Negative for visual disturbance.  Respiratory:  Negative for chest tightness and shortness of breath.   Cardiovascular:  Negative for chest pain and leg swelling.  Skin:  Negative for rash.  Neurological:  Negative for dizziness, light-headedness and headaches.  Psychiatric/Behavioral:  Positive for dysphoric mood. Negative for agitation, behavioral problems, self-injury, sleep disturbance and suicidal ideas. The patient is nervous/anxious.   All other systems reviewed and are negative.   Per HPI unless specifically indicated  above   Allergies as of 09/26/2022       Reactions   Catfish [fish Allergy] Shortness Of Breath   Doxycycline Shortness Of Breath   Wellbutrin [bupropion] Shortness Of Breath, Nausea Only, Palpitations   Tape Swelling   Plastic tape please use paper        Medication List        Accurate as of September 26, 2022  2:31 PM. If you have any questions, ask your nurse or doctor.          STOP taking these medications    diclofenac 75 MG EC tablet Commonly known as: VOLTAREN Stopped by: [name], MD   HYDROcodone bit-homatropine 5-1.5 MG/5ML syrup Commonly known as: HYCODAN Stopped by: Fransisca Kaufmann Kaytee Taliercio, MD   naproxen 500 MG tablet Commonly known as: Naprosyn Stopped by: [name], MD       TAKE these medications    cyclobenzaprine 10 MG tablet Commonly known as: FLEXERIL Take 1 tablet (10 mg total) by mouth 3 (three) times daily as needed for muscle spasms.   fluticasone 50 MCG/ACT nasal spray Commonly known as: FLONASE Place 2 sprays into both nostrils daily.   hydrOXYzine 25 MG capsule Commonly known as: VISTARIL Take 1 capsule (25 mg total) by mouth every 8 (eight) hours as needed. Started by: [name], MD   multivitamin tablet Take 1 tablet by mouth daily.   sertraline 100  MG tablet Commonly known as: ZOLOFT Take 1.5 tablets (150 mg total) by mouth at bedtime. What changed: how much to take Changed by: [name], MD   traZODone 100 MG tablet Commonly known as: DESYREL Take 1-1.5 tablets (100-150 mg total) by mouth as needed for sleep.         Objective:   BP 103/67   Pulse 73   Ht '5\' 2"'$  (1.575 m)   Wt 153 lb (69.4 kg)   LMP 08/23/2022 (Exact Date)   SpO2 95%   BMI 27.98 kg/m   Wt Readings from Last 3 Encounters:  09/26/22 153 lb (69.4 kg)  07/27/22 140 lb (63.5 kg)  07/23/22 147 lb (66.7 kg)    Physical Exam Vitals and nursing note reviewed.  Constitutional:      General: She is not in acute  distress.    Appearance: She is well-developed. She is not diaphoretic.  Eyes:     Conjunctiva/sclera: Conjunctivae normal.  Cardiovascular:     Rate and Rhythm: Normal rate and regular rhythm.     Heart sounds: Normal heart sounds. No murmur heard. Pulmonary:     Effort: Pulmonary effort is normal. No respiratory distress.     Breath sounds: Normal breath sounds. No wheezing.  Musculoskeletal:        General: No tenderness. Normal range of motion.  Skin:    General: Skin is warm and dry.     Findings: No rash.  Neurological:     Mental Status: She is alert and oriented to person, place, and time.     Coordination: Coordination normal.  Psychiatric:        Mood and Affect: Mood is anxious and depressed.        Behavior: Behavior normal.        Thought Content: Thought content does not include suicidal ideation. Thought content does not include suicidal plan.       Assessment & Plan:   Problem List Items Addressed This Visit       Other   Anxiety - Primary   Relevant Medications   hydrOXYzine (VISTARIL) 25 MG capsule   sertraline (ZOLOFT) 100 MG tablet   Depression, recurrent (HCC)   Relevant Medications   hydrOXYzine (VISTARIL) 25 MG capsule   sertraline (ZOLOFT) 100 MG tablet    Will increase Zoloft to 150 mg daily and will add hydroxyzine as needed to help with pseudoseizures. Follow up plan: Return if symptoms worsen or fail to improve, for Anxiety recheck 1 to 2 months.  Counseling provided for all of the vaccine components No orders of the defined types were placed in this encounter.   Caryl Pina, MD [facility] 09/26/2022, 2:31 PM

## 2022-10-19 ENCOUNTER — Encounter: Payer: Self-pay | Admitting: Obstetrics and Gynecology

## 2022-10-19 ENCOUNTER — Other Ambulatory Visit: Payer: Medicaid Other | Admitting: Obstetrics and Gynecology

## 2022-10-19 NOTE — Patient Outreach (Signed)
Medicaid Managed Care   Nurse Care Manager Note  [id number] Name:  Jaime Flores MRN:  [id number] DOB:  [birth year]  Jaime Flores is an 37 y.o. year old female who is a primary patient of [name], Fransisca Kaufmann, MD.  The [facility] Managed Care Coordination team was consulted for assistance with:    Chronic healthcare management needs, anxiety/depression, tobacco use, rhinitis, PNES  Jaime Flores was given information about Medicaid Managed Care Coordination team services today. Jaime Flores Patient agreed to services and verbal consent obtained.  Engaged with patient by telephone for follow up visit in response to provider referral for case management and/or care coordination services.   Assessments/Interventions:  Review of past medical history, allergies, medications, health status, including review of consultants reports, laboratory and other test data, was performed as part of comprehensive evaluation and provision of chronic care management services.  SDOH (Social Determinants of Health) assessments and interventions performed: SDOH Interventions    Flowsheet Row Patient Outreach Telephone from 10/19/2022 in [place] Office Visit from 09/26/2022 in [facility] Patient Outreach Telephone from 09/24/2022 in [place] Patient Outreach Telephone from 08/23/2022 in [place] Office Visit from 07/18/2022 in [facility] Office Visit from 01/03/2022 in [facility]  SDOH Interventions        Alcohol Usage Interventions -- -- -- Intervention Not Indicated (Score <7) -- --  Depression Interventions/Treatment  -- Medication -- -- Currently on Treatment Medication, Counseling, Currently on Treatment  Financial Strain Interventions Intervention Not Indicated -- -- -- -- --  Physical Activity Interventions  -- -- Intervention Not Indicated -- -- --  Stress Interventions -- -- -- Other (Comment)  [patient states she is on medication-declined services] -- --  Social Connections Interventions Intervention Not Indicated -- -- -- -- --     Care Plan  Allergies  Allergen Reactions   Catfish [Fish Allergy] Shortness Of Breath   Doxycycline Shortness Of Breath   Wellbutrin [Bupropion] Shortness Of Breath, Nausea Only and Palpitations   Tape Swelling    Plastic tape please use paper   Medications Reviewed Today     Reviewed by Jaime Medicus, RN (Registered Nurse) on 10/19/22 at 1143  Med List Status: <None>   Medication Order Taking? Sig Documenting Provider Last Dose Status Informant  cyclobenzaprine (FLEXERIL) 10 MG tablet [id number] No Take 1 tablet (10 mg total) by mouth 3 (three) times daily as needed for muscle spasms. [name], Fransisca Kaufmann, MD Taking Active   fluticasone [facility]) 50 MCG/ACT nasal spray [id number] No Place 2 sprays into both nostrils daily. [name], FNP Taking Active   hydrOXYzine (VISTARIL) 25 MG capsule [id number]  Take 1 capsule (25 mg total) by mouth every 8 (eight) hours as needed. [name], Fransisca Kaufmann, MD  Active   Multiple Vitamin (MULTIVITAMIN) tablet [id number] No Take 1 tablet by mouth daily. [provider] Taking Active Self  sertraline (ZOLOFT) 100 MG tablet [id number]  Take 1.5 tablets (150 mg total) by mouth at bedtime. [name], Fransisca Kaufmann, MD  Active   traZODone (DESYREL) 100 MG tablet [id number] No Take 1-1.5 tablets (100-150 mg total) by mouth as needed for sleep. [name], Fransisca Kaufmann, MD Taking Active            Patient Active Problem List   Diagnosis Date Noted   Prediabetes 02/15/2020   Anxiety 11/18/2019   Depression, recurrent (  [place]) 11/18/2019   Seasonal and perennial allergic rhinitis 05/15/2019   Amenorrhea 01/27/2013   Conditions to be addressed/monitored per PCP order:  Chronic healthcare management needs, anxiety/depression,  tobacco use, rhinitis, PNES  Care Plan : RN Care Manager Plan of Care  Updates made by Jaime Medicus, RN since 10/19/2022 12:00 AM     Problem: Health Promotion or Disease Self-Management (General Plan of Care)      Long-Range Goal: Chronic Disease Management   Start Date: 08/23/2022  Expected End Date: 11/21/2022  Priority: High  Note:   Current Barriers:  Knowledge Deficits related to plan of care for management of Anxiety, Depression, allergic rhinitis, and PNES, tobacco use  Chronic Disease Management support and education needs related to Anxiety, Depression, Allergic Rhinitis, and PNES, tobacco use  10/19/22:  Patient states anxiety and depression improved with increase in Zoloft to 150 mg-no seizure activity since increasing Zoloft.  No change in smoking -1/2 ppd.  Concerned about husband's health.  RNCM Clinical Goal(s):  Patient will verbalize understanding of plan for management of Anxiety, Depression, Allergic Rhinitis, and PNES, tobacco use  as evidenced by patient report verbalize basic understanding of  Anxiety, Depression, Allergic Rhinitis, and PNES, tobacco use  disease process and self health management plan as evidenced by patient report take all medications exactly as prescribed and will call provider for medication related questions as evidenced by patient report demonstrate understanding of rationale for each prescribed medication as evidenced by patient report attend all scheduled medical appointments as evidenced by patient report and EMR review demonstrate Ongoing adherence to prescribed treatment plan for Anxiety, Depression, Allergic Rhinitis, and PNES, tobacco use as evidenced by patient report and EMR review continue to work with RN Care Manager to address care management and care coordination needs related to  Anxiety, Depression, Allergic Rhinitis, and PNES as evidenced by adherence to CM Team Scheduled appointments through collaboration with RN Care manager,  provider, and care team.   Interventions: Inter-disciplinary care team collaboration (see longitudinal plan of care) Evaluation of current treatment plan related to  self management and patient's adherence to plan as established by provider  Smoking Cessation Interventions:  (Status:  New goal.) Long Term Goal Reviewed smoking history:  ; currently smoking 1/2 ppd On a scale of 1-10, reports MOTIVATION to quit is 1 On a scale of 1-10, reports CONFIDENCE in quitting is 1 09/24/22:  Patient is not interested in stopping right now.  Evaluation of current treatment plan reviewed Advised patient to discuss smoking cessation options with provider Provided contact information for [place] Quit Line (1-800-QUIT-NOW) Discussed plans with patient for ongoing care management follow up and provided patient with direct contact information for care management team Assessed social determinant of health barriers     (Status:  New goal.)  Long Term Goal Evaluation of current treatment plan related to Anxiety, Depression, Allergic Rhinitis, and PNES , self-management and patient's adherence to plan as established by provider. Discussed plans with patient for ongoing care management follow up and provided patient with direct contact information for care management team Evaluation of current treatment plan related to anxiety, depression, rhinitis, PNES  and patient's adherence to plan as established by provider Reviewed medications with patient Reviewed scheduled/upcoming provider appointments  Discussed plans with patient for ongoing care management follow up and provided patient with direct contact information for care management team Assessed social determinant of health barriers  Patient Goals/Self-Care Activities: Take all medications as prescribed Attend all scheduled provider appointments Call  pharmacy for medication refills 3-7 days in advance of running out of medications Perform all self care  activities independently  Perform IADL's (shopping, preparing meals, housekeeping, managing finances) independently Call provider office for new concerns or questions   Follow Up Plan:  The patient has been provided with contact information for the care management team and has been advised to call with any health related questions or concerns.  The care management team will reach out to the patient again over the next 30 business  days.     Long-Range Goal: Establish Plan of Care for Chronic Disease Management Needs   Priority: High  Note:   Timeframe:  Long-Range Goal Priority:  High Start Date: 08/23/22                            Expected End Date: ongoing                      Follow Up Date 11/20/22   - practice safe sex - schedule appointment for flu shot - schedule appointment for vaccines needed due to my age or health - schedule recommended health tests (blood work, mammogram, colonoscopy, pap test) - schedule and keep appointment for annual check-up    Why is this important?   Screening tests can find diseases early when they are easier to treat.  Your doctor or nurse will talk with you about which tests are important for you.  Getting shots for common diseases like the flu and shingles will help prevent them.  10/19/22:  GYN appt 4/1, PCP 4/11   Follow Up:  Patient agrees to Care Plan and Follow-up.  Plan: The Managed Medicaid care management team will reach out to the patient again over the next 30 business  days. and The  Patient has been provided with contact information for the Managed Medicaid care management team and has been advised to call with any health related questions or concerns.  Date/time of next scheduled RN care management/care coordination outreach: 11/20/22 at 0900

## 2022-10-19 NOTE — Patient Instructions (Signed)
Hi Jaime Flores, glad we got to speak this morning-I am glad you are feeling better!  Jaime Flores was given information about Medicaid Managed Care team care coordination services as a part of their Healthy [facility] Medicaid benefit. Jaime Flores verbally consented to engagement with the [facility] Managed Care team.   If you are experiencing a medical emergency, please call 911 or report to your local emergency department or urgent care.   If you have a non-emergency medical problem during routine business hours, please contact your provider's office and ask to speak with a nurse.   For questions related to your Healthy [facility] health plan, please call: [phone] or visit the homepage here: [email or web address]  If you would like to schedule transportation through your Healthy [facility] plan, please call the following number at least 2 days in advance of your appointment: [phone]  For information about your ride after you set it up, call Ride Assist at [phone]. Use this number to activate a Will Call pickup, or if your transportation is late for a scheduled pickup. Use this number, too, if you need to make a change or cancel a previously scheduled reservation.  If you need transportation services right away, call [phone]. The after-hours call center is staffed 24 hours to handle ride assistance and urgent reservation requests (including discharges) 365 days a year. Urgent trips include sick visits, hospital discharge requests and life-sustaining treatment.  Call the [facility] at [phone], at any time, 24 hours a day, 7 days a week. If you are in danger or need immediate medical attention call 911.  If you would like help to quit smoking, call 1-800-QUIT-NOW 507-050-0973) OR Espaol: 1-855-Djelo-Ya HD:1601594) o para ms informacin haga clic aqu or Text READY to 200-400 to register via text  Jaime Flores - following are the goals we discussed in your visit today:   Goals Addressed    Timeframe:  Long-Range Goal Priority:  High Start Date: 08/23/22                            Expected End Date: ongoing                      Follow Up Date 11/20/22   - practice safe sex - schedule appointment for flu shot - schedule appointment for vaccines needed due to my age or health - schedule recommended health tests (blood work, mammogram, colonoscopy, pap test) - schedule and keep appointment for annual check-up    Why is this important?   Screening tests can find diseases early when they are easier to treat.  Your doctor or nurse will talk with you about which tests are important for you.  Getting shots for common diseases like the flu and shingles will help prevent them.  10/19/22:  GYN appt 4/1, PCP 4/11  Patient verbalizes understanding of instructions and care plan provided today and agrees to view in [place]. Active MyChart status and patient understanding of how to access instructions and care plan via MyChart confirmed with patient.     The Managed Medicaid care management team will reach out to the patient again over the next 30 business  days.  The  Patient  has been provided with contact information for the Managed Medicaid care management team and has been advised to call with any health related questions or concerns.   Aida Raider RN, BSN [place]  [facility]  [place] Management Coordinator - Managed Medicaid High Risk [phone]   Following is a copy of your plan of care:  Care Plan : [place] of Care  Updates made by [name], RN since 10/19/2022 12:00 AM     Problem: Health Promotion or Disease Self-Management (General Plan of Care)      Long-Range Goal: Chronic Disease Management   Start Date: 08/23/2022  Expected End Date: 11/21/2022  Priority: High  Note:   Current Barriers:  Knowledge Deficits related to plan of care for  management of Anxiety, Depression, allergic rhinitis, and PNES, tobacco use  Chronic Disease Management support and education needs related to Anxiety, Depression, Allergic Rhinitis, and PNES, tobacco use  10/19/22:  Patient states anxiety and depression improved with increase in Zoloft to 150 mg-no seizure activity since increasing Zoloft.  No change in smoking -1/2 ppd.  Concerned about husband's health.  RNCM Clinical Goal(s):  Patient will verbalize understanding of plan for management of Anxiety, Depression, Allergic Rhinitis, and PNES, tobacco use  as evidenced by patient report verbalize basic understanding of  Anxiety, Depression, Allergic Rhinitis, and PNES, tobacco use  disease process and self health management plan as evidenced by patient report take all medications exactly as prescribed and will call provider for medication related questions as evidenced by patient report demonstrate understanding of rationale for each prescribed medication as evidenced by patient report attend all scheduled medical appointments as evidenced by patient report and EMR review demonstrate Ongoing adherence to prescribed treatment plan for Anxiety, Depression, Allergic Rhinitis, and PNES, tobacco use as evidenced by patient report and EMR review continue to work with RN Care Manager to address care management and care coordination needs related to  Anxiety, Depression, Allergic Rhinitis, and PNES as evidenced by adherence to CM Team Scheduled appointments through collaboration with RN Care manager, provider, and care team.   Interventions: Inter-disciplinary care team collaboration (see longitudinal plan of care) Evaluation of current treatment plan related to  self management and patient's adherence to plan as established by provider  Smoking Cessation Interventions:  (Status:  New goal.) Long Term Goal Reviewed smoking history:  ; currently smoking 1/2 ppd On a scale of 1-10, reports MOTIVATION to quit  is 1 On a scale of 1-10, reports CONFIDENCE in quitting is 1 09/24/22:  Patient is not interested in stopping right now.  Evaluation of current treatment plan reviewed Advised patient to discuss smoking cessation options with provider Provided contact information for [place] Quit Line (1-800-QUIT-NOW) Discussed plans with patient for ongoing care management follow up and provided patient with direct contact information for care management team Assessed social determinant of health barriers     (Status:  New goal.)  Long Term Goal Evaluation of current treatment plan related to Anxiety, Depression, Allergic Rhinitis, and PNES , self-management and patient's adherence to plan as established by provider. Discussed plans with patient for ongoing care management follow up and provided patient with direct contact information for care management team Evaluation of current treatment plan related to anxiety, depression, rhinitis, PNES  and patient's adherence to plan as established by provider Reviewed medications with patient Reviewed scheduled/upcoming provider appointments  Discussed plans with patient for ongoing care management follow up and provided patient with direct contact information for care management team Assessed social determinant of health barriers  Patient Goals/Self-Care Activities: Take all medications as prescribed Attend all scheduled provider appointments Call pharmacy for medication refills 3-7 days in advance of running out  of medications Perform all self care activities independently  Perform IADL's (shopping, preparing meals, housekeeping, managing finances) independently Call provider office for new concerns or questions   Follow Up Plan:  The patient has been provided with contact information for the care management team and has been advised to call with any health related questions or concerns.  The care management team will reach out to the patient again over the next 30  business  days.

## 2022-10-22 ENCOUNTER — Ambulatory Visit: Payer: Medicaid Other | Admitting: Adult Health

## 2022-11-01 ENCOUNTER — Telehealth (INDEPENDENT_AMBULATORY_CARE_PROVIDER_SITE_OTHER): Payer: Medicaid Other | Admitting: Family Medicine

## 2022-11-01 ENCOUNTER — Encounter: Payer: Self-pay | Admitting: Family Medicine

## 2022-11-01 DIAGNOSIS — R252 Cramp and spasm: Secondary | ICD-10-CM | POA: Diagnosis not present

## 2022-11-01 DIAGNOSIS — F339 Major depressive disorder, recurrent, unspecified: Secondary | ICD-10-CM | POA: Diagnosis not present

## 2022-11-01 DIAGNOSIS — F419 Anxiety disorder, unspecified: Secondary | ICD-10-CM

## 2022-11-01 NOTE — Progress Notes (Signed)
Virtual Visit via MyChart video note  I connected with Jaime Flores on 11/01/22 at 1330 by video and verified that I am speaking with the correct person using two identifiers. Jaime Flores is currently located at home and patient are currently with her during visit. The provider, Elige Radon Jeanell Mangan, MD is located in their office at time of visit.  Call ended at 1341  I discussed the limitations, risks, security and privacy concerns of performing an evaluation and management service by video and the availability of in person appointments. I also discussed with the patient that there may be a patient responsible charge related to this service. The patient expressed understanding and agreed to proceed.   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  She has a lot going on but has not been having pseudoseizures.  She says that it is going great. She says move going great.  She is about to evicted and work hours are increased but she says stress has been manageable.  She still feels emotion but it is not overwhelming.  She denies suicidal ideations. She is having some sleeping issues still. She denies side effects from the medicine. She denies any major issues.   1. Anxiety   2. Depression, recurrent   3. Muscle cramp     Outpatient Encounter Medications as of 11/01/2022  Medication Sig   cyclobenzaprine (FLEXERIL) 10 MG tablet Take 1 tablet (10 mg total) by mouth 3 (three) times daily as needed for muscle spasms.   fluticasone (FLONASE) 50 MCG/ACT nasal spray Place 2 sprays into both nostrils daily.   hydrOXYzine (VISTARIL) 25 MG capsule Take 1 capsule (25 mg total) by mouth every 8 (eight) hours as needed.   Multiple Vitamin (MULTIVITAMIN) tablet Take 1 tablet by mouth daily.   sertraline (ZOLOFT) 100 MG tablet Take 1.5 tablets (150 mg total) by mouth at bedtime.   traZODone (DESYREL) 100 MG tablet Take 1-1.5 tablets (100-150 mg  total) by mouth as needed for sleep.   No facility-administered encounter medications on file as of 11/01/2022.    Review of Systems  Constitutional:  Negative for chills and fever.  Eyes:  Negative for visual disturbance.  Respiratory:  Negative for chest tightness and shortness of breath.   Cardiovascular:  Negative for chest pain and leg swelling.  Skin:  Negative for rash.  Neurological:  Negative for light-headedness and headaches.  Psychiatric/Behavioral:  Positive for dysphoric mood. Negative for agitation, behavioral problems, self-injury, sleep disturbance and suicidal ideas. The patient is nervous/anxious.   All other systems reviewed and are negative.   Observations/Objective: Patient sounds comfortable and in no acute distress  Assessment and Plan: Problem List Items Addressed This Visit       Other   Anxiety - Primary   Relevant Orders   CBC with Differential/Platelet   CMP14+EGFR   TSH   Magnesium   Depression, recurrent   Relevant Orders   CBC with Differential/Platelet   CMP14+EGFR   TSH   Magnesium   Other Visit Diagnoses     Muscle cramp       Relevant Orders   CBC with Differential/Platelet   CMP14+EGFR   TSH   Magnesium       Continue current medication.  Follow up plan: Return in about 2 months (around 01/01/2023), or if symptoms worsen or fail to improve, for depression and bloodwork.     I discussed the assessment and treatment plan with the  patient. The patient was provided an opportunity to ask questions and all were answered. The patient agreed with the plan and demonstrated an understanding of the instructions.   The patient was advised to call back or seek an in-person evaluation if the symptoms worsen or if the condition fails to improve as anticipated.  The above assessment and management plan was discussed with the patient. The patient verbalized understanding of and has agreed to the management plan. Patient is aware to call the  clinic if symptoms persist or worsen. Patient is aware when to return to the clinic for a follow-up visit. Patient educated on when it is appropriate to go to the emergency department.    I provided 11 minutes of non-face-to-face time during this encounter.    Nils Pyle, MD

## 2022-11-11 ENCOUNTER — Other Ambulatory Visit: Payer: Self-pay | Admitting: Family Medicine

## 2022-11-11 DIAGNOSIS — J069 Acute upper respiratory infection, unspecified: Secondary | ICD-10-CM

## 2022-11-11 DIAGNOSIS — H938X3 Other specified disorders of ear, bilateral: Secondary | ICD-10-CM

## 2022-11-12 MED ORDER — CYCLOBENZAPRINE HCL 10 MG PO TABS: 10.0000 mg | ORAL_TABLET | Freq: Three times a day (TID) | ORAL | 0 refills | Status: DC | PRN

## 2022-11-12 MED ORDER — FLUTICASONE PROPIONATE 50 MCG/ACT NA SUSP: 2.0000 | Freq: Every day | NASAL | 0 refills | Status: DC

## 2022-11-20 ENCOUNTER — Other Ambulatory Visit: Payer: Medicaid Other

## 2022-11-20 ENCOUNTER — Other Ambulatory Visit: Payer: Medicaid Other | Admitting: Obstetrics and Gynecology

## 2022-11-20 DIAGNOSIS — F419 Anxiety disorder, unspecified: Secondary | ICD-10-CM

## 2022-11-20 DIAGNOSIS — R252 Cramp and spasm: Secondary | ICD-10-CM | POA: Diagnosis not present

## 2022-11-20 DIAGNOSIS — F339 Major depressive disorder, recurrent, unspecified: Secondary | ICD-10-CM

## 2022-11-20 NOTE — Patient Outreach (Signed)
  Medicaid Managed Care   Unsuccessful Attempt Note   11/20/2022 Name: Jaime Flores MRN: [id number] DOB: 04/27/1986  Referred by: [name], Elige Radon, MD Reason for referral : High Risk Managed Medicaid (Unsuccessful telephone outreach)  An unsuccessful telephone outreach was attempted today. The patient was referred to the case management team for assistance with care management and care coordination.    Follow Up Plan: The Managed Medicaid care management team will reach out to the patient again over the next 30 business  days. and The  Patient has been provided with contact information for the Managed Medicaid care management team and has been advised to call with any health related questions or concerns.   [name] RN, BSN   [facility], [facility] - Managed Medicaid High Risk [phone]

## 2022-11-20 NOTE — Patient Instructions (Signed)
Hi Ms. Bora, sorry I missed you today, I hope you are doing okay - as a part of your Medicaid benefit, you are eligible for care management and care coordination services at no cost or copay. I was unable to reach you by phone today but would be happy to help you with your health related needs. Please feel free to call me at [phone]   A member of the Managed Medicaid care management team will reach out to you again over the next 30 business days.   [name] RN, BSN [place]  [facility] [employer] - Managed Medicaid High Risk [phone]

## 2022-11-21 LAB — CMP14+EGFR
ALT: 17 IU/L (ref 0–32)
AST: 19 IU/L (ref 0–40)
Albumin/Globulin Ratio: 1.7 (ref 1.2–2.2)
Albumin: 4.4 g/dL (ref 3.9–4.9)
Alkaline Phosphatase: 113 IU/L (ref 44–121)
BUN/Creatinine Ratio: 6 — ABNORMAL LOW (ref 9–23)
BUN: 4 mg/dL — ABNORMAL LOW (ref 6–20)
Bilirubin Total: <0.2 mg/dL (ref 0.0–1.2)
CO2: 24 mmol/L (ref 20–29)
Calcium: 9.6 mg/dL (ref 8.7–10.2)
Chloride: 102 mmol/L (ref 96–106)
Creatinine, Ser: 0.65 mg/dL (ref 0.57–1.00)
Globulin, Total: 2.6 g/dL (ref 1.5–4.5)
Glucose: 72 mg/dL (ref 70–99)
Potassium: 3.8 mmol/L (ref 3.5–5.2)
Sodium: 140 mmol/L (ref 134–144)
Total Protein: 7 g/dL (ref 6.0–8.5)
eGFR: 118 mL/min/{1.73_m2} (ref >59)

## 2022-11-21 LAB — CBC WITH DIFFERENTIAL/PLATELET
Basophils Absolute: 0.1 10*3/uL (ref 0.0–0.2)
Basos: 1 %
EOS (ABSOLUTE): 0.2 10*3/uL (ref 0.0–0.4)
Eos: 2 %
Hematocrit: 40.4 % (ref 34.0–46.6)
Hemoglobin: 13 g/dL (ref 11.1–15.9)
Immature Grans (Abs): 0 10*3/uL (ref 0.0–0.1)
Immature Granulocytes: 0 %
Lymphocytes Absolute: 2.7 10*3/uL (ref 0.7–3.1)
Lymphs: 33 %
MCH: 28 pg (ref 26.6–33.0)
MCHC: 32.2 g/dL (ref 31.5–35.7)
MCV: 87 fL (ref 79–97)
Monocytes Absolute: 0.4 10*3/uL (ref 0.1–0.9)
Monocytes: 4 %
Neutrophils Absolute: 4.9 10*3/uL (ref 1.4–7.0)
Neutrophils: 60 %
Platelets: 330 10*3/uL (ref 150–450)
RBC: 4.64 x10E6/uL (ref 3.77–5.28)
RDW: 13.7 % (ref 11.7–15.4)
WBC: 8.2 10*3/uL (ref 3.4–10.8)

## 2022-11-21 LAB — MAGNESIUM: Magnesium: 2 mg/dL (ref 1.6–2.3)

## 2022-11-21 LAB — TSH: TSH: 2.59 u[IU]/mL (ref 0.450–4.500)

## 2022-11-27 ENCOUNTER — Other Ambulatory Visit: Payer: Medicaid Other | Admitting: Obstetrics and Gynecology

## 2022-11-27 ENCOUNTER — Encounter: Payer: Self-pay | Admitting: Obstetrics and Gynecology

## 2022-11-27 NOTE — Patient Outreach (Signed)
Medicaid Managed Care   Nurse Care Manager Note  11/27/2022 Name:  Jaime Flores MRN:  [id number] DOB:  07-21-1986  Jaime Flores is an 37 y.o. year old female who is a primary patient of Jaime Flores.  The Medicaid Managed Care Coordination team was consulted for assistance with:    Healthcare management needs, rhinitis, anxiety/depression, tobacco use, PNES  Jaime Flores was given information about Medicaid Managed Care Coordination team services today. Jaime Flores Patient agreed to services and verbal consent obtained.  Engaged with patient by telephone for follow up visit in response to provider referral for case management and/or care coordination services.   Assessments/Interventions:  Review of past medical history, allergies, medications, health status, including review of consultants reports, laboratory and other test data, was performed as part of comprehensive evaluation and provision of chronic care management services.  SDOH (Social Determinants of Health) assessments and interventions performed: SDOH Interventions    Flowsheet Row Patient Outreach Telephone from 11/27/2022 in [place] POPULATION HEALTH DEPARTMENT Patient Outreach Telephone from 10/19/2022 in [place] POPULATION HEALTH DEPARTMENT Office Visit from 09/26/2022 in [facility] Patient Outreach Telephone from 09/24/2022 in [place] POPULATION HEALTH DEPARTMENT Patient Outreach Telephone from 08/23/2022 in  POPULATION HEALTH DEPARTMENT Office Visit from 07/18/2022 in [facility]  SDOH Interventions        Food Insecurity Interventions Intervention Not Indicated -- -- -- -- --  Transportation Interventions Intervention Not Indicated -- -- -- -- --  Alcohol Usage Interventions -- -- -- -- Intervention Not Indicated (Score <7) --  Depression Interventions/Treatment  -- -- Medication -- -- Currently on Treatment   Financial Strain Interventions -- Intervention Not Indicated -- -- -- --  Physical Activity Interventions -- -- -- Intervention Not Indicated -- --  Stress Interventions -- -- -- -- Other (Comment)  [patient states she is on medication-declined services] --  Social Connections Interventions -- Intervention Not Indicated -- -- -- --     Care Plan  Allergies  Allergen Reactions   Catfish [Fish Allergy] Shortness Of Breath   Doxycycline Shortness Of Breath   Wellbutrin [Bupropion] Shortness Of Breath, Nausea Only and Palpitations   Tape Swelling    Plastic tape please use paper   Medications Reviewed Today     Reviewed by [name], RN (Registered Nurse) on 11/27/22 at 1039  Med List Status: <None>   Medication Order Taking? Sig Documenting Provider Last Dose Status Informant  cyclobenzaprine (FLEXERIL) 10 MG tablet [id number]  Take 1 tablet (10 mg total) by mouth 3 (three) times daily as needed for muscle spasms. Jaime Flores  Active   fluticasone Jaime Flores) 50 MCG/ACT nasal spray [id number]  Place 2 sprays into both nostrils daily. Jaime Flores  Active   hydrOXYzine (VISTARIL) 25 MG capsule [id number]  Take 1 capsule (25 mg total) by mouth every 8 (eight) hours as needed. Jaime Flores  Active   Multiple Vitamin (MULTIVITAMIN) tablet [id number] No Take 1 tablet by mouth daily. Provider, Historical, Flores Taking Active Self  sertraline (ZOLOFT) 100 MG tablet [id number]  Take 1.5 tablets (150 mg total) by mouth at bedtime. Jaime Flores  Active   traZODone (DESYREL) 100 MG tablet [id number] No Take 1-1.5 tablets (100-150 mg total) by mouth as needed for sleep. Jaime Flores Taking Active            Patient Active Problem  List   Diagnosis Date Noted   Prediabetes 02/15/2020   Anxiety 11/18/2019   Depression, recurrent (HCC) 11/18/2019   Seasonal and perennial allergic rhinitis 05/15/2019   Amenorrhea 01/27/2013   Conditions to be  addressed/monitored per PCP order:  Healthcare management needs, rhinitis, anxiety/depression, tobacco use, PNES  Care Plan : RN Care Manager Plan of Care  Updates made by [name], RN since 11/27/2022 12:00 AM     Problem: Health Promotion or Disease Self-Management (General Plan of Care)      Long-Range Goal: Chronic Disease Management   Start Date: 08/23/2022  Expected End Date: 11/21/2022  Priority: High  Note:   Current Barriers:  Knowledge Deficits related to plan of care for management of Anxiety, Depression, allergic rhinitis, and PNES, tobacco use  Chronic Disease Management support and education needs related to Anxiety, Depression, Allergic Rhinitis, and PNES, tobacco use  11/27/22:  No complaints today-no seizure activity.  Husband doing better.  Smokes 1/2 ppd.  RNCM Clinical Goal(s):  Patient will verbalize understanding of plan for management of Anxiety, Depression, Allergic Rhinitis, and PNES, tobacco use  as evidenced by patient report verbalize basic understanding of  Anxiety, Depression, Allergic Rhinitis, and PNES, tobacco use  disease process and self health management plan as evidenced by patient report take all medications exactly as prescribed and will call provider for medication related questions as evidenced by patient report demonstrate understanding of rationale for each prescribed medication as evidenced by patient report attend all scheduled medical appointments as evidenced by patient report and EMR review demonstrate Ongoing adherence to prescribed treatment plan for Anxiety, Depression, Allergic Rhinitis, and PNES, tobacco use as evidenced by patient report and EMR review continue to work with RN Care Manager to address care management and care coordination needs related to  Anxiety, Depression, Allergic Rhinitis, and PNES as evidenced by adherence to CM Team Scheduled appointments through collaboration with RN Care manager, provider, and care team.    Interventions: Inter-disciplinary care team collaboration (see longitudinal plan of care) Evaluation of current treatment plan related to  self management and patient's adherence to plan as established by provider  Smoking Cessation Interventions:  (Status:  New goal.) Long Term Goal Reviewed smoking history:  ; currently smoking 1/2 ppd On a scale of 1-10, reports MOTIVATION to quit is 1 On a scale of 1-10, reports CONFIDENCE in quitting is 1 09/24/22:  Patient is not interested in stopping right now.  Evaluation of current treatment plan reviewed Advised patient to discuss smoking cessation options with provider Provided contact information for [place] Quit Line (1-800-QUIT-NOW) Discussed plans with patient for ongoing care management follow up and provided patient with direct contact information for care management team Assessed social determinant of health barriers     (Status:  New goal.)  Long Term Goal Evaluation of current treatment plan related to Anxiety, Depression, Allergic Rhinitis, and PNES , self-management and patient's adherence to plan as established by provider. Discussed plans with patient for ongoing care management follow up and provided patient with direct contact information for care management team Evaluation of current treatment plan related to anxiety, depression, rhinitis, PNES  and patient's adherence to plan as established by provider Reviewed medications with patient Reviewed scheduled/upcoming provider appointments  Discussed plans with patient for ongoing care management follow up and provided patient with direct contact information for care management team Assessed social determinant of health barriers  Patient Goals/Self-Care Activities: Take all medications as prescribed Attend all scheduled provider appointments Call  pharmacy for medication refills 3-7 days in advance of running out of medications Perform all self care activities independently  Perform  IADL's (shopping, preparing meals, housekeeping, managing finances) independently Call provider office for new concerns or questions   Follow Up Plan:  The patient has been provided with contact information for the care management team and has been advised to call with any health related questions or concerns.  The care management team will reach out to the patient again over the next 60 business  days.    Long-Range Goal: Establish Plan of Care for Chronic Disease Management Needs   Priority: High  Note:   Timeframe:  Long-Range Goal Priority:  High Start Date: 08/23/22                            Expected End Date: ongoing                      Follow Up Date 01/25/23   - practice safe sex - schedule appointment for flu shot - schedule appointment for vaccines needed due to my age or health - schedule recommended health tests (blood work, mammogram, colonoscopy, pap test) - schedule and keep appointment for annual check-up    Why is this important?   Screening tests can find diseases early when they are easier to treat.  Your doctor or nurse will talk with you about which tests are important for you.  Getting shots for common diseases like the flu and shingles will help prevent them.  11/27/22: PCP f/u appt scheduled   Follow Up:  Patient agrees to Care Plan and Follow-up.  Plan: The Managed Medicaid care management team will reach out to the patient again over the next 30 business  days. and The  Patient has been provided with contact information for the Managed Medicaid care management team and has been advised to call with any health related questions or concerns.  Date/time of next scheduled RN care management/care coordination outreach:  01/25/23 at 1030

## 2022-11-27 NOTE — Patient Instructions (Signed)
Hi Jaime Flores, I am glad you are feeling better-have a great day!  Jaime Flores was given information about Medicaid Managed Care team care coordination services as a part of their Healthy [facility] Medicaid benefit. Jaime Flores verbally consented to engagement with the [facility] Managed Care team.   If you are experiencing a medical emergency, please call 911 or report to your local emergency department or urgent care.   If you have a non-emergency medical problem during routine business hours, please contact your provider's office and ask to speak with a nurse.   For questions related to your Healthy [facility] health plan, please call: [phone] or visit the homepage here: [email or web address]  If you would like to schedule transportation through your Healthy [facility] plan, please call the following number at least 2 days in advance of your appointment: [phone]  For information about your ride after you set it up, call Ride Assist at [phone]. Use this number to activate a Will Call pickup, or if your transportation is late for a scheduled pickup. Use this number, too, if you need to make a change or cancel a previously scheduled reservation.  If you need transportation services right away, call [phone]. The after-hours call center is staffed 24 hours to handle ride assistance and urgent reservation requests (including discharges) 365 days a year. Urgent trips include sick visits, hospital discharge requests and life-sustaining treatment.  Call the [place] Line at [phone], at any time, 24 hours a day, 7 days a week. If you are in danger or need immediate medical attention call 911.  If you would like help to quit smoking, call 1-800-QUIT-NOW ((516) 409-0486) OR Espaol: 1-855-Djelo-Ya (4-742-595-6387) o para ms informacin haga clic aqu or Text READY to 564-332 to register via text  Jaime Flores - following  are the goals we discussed in your visit today:   Goals Addressed    Timeframe:  Long-Range Goal Priority:  High Start Date: 08/23/22                            Expected End Date: ongoing                      Follow Up Date 01/25/23   - practice safe sex - schedule appointment for flu shot - schedule appointment for vaccines needed due to my age or health - schedule recommended health tests (blood work, mammogram, colonoscopy, pap test) - schedule and keep appointment for annual check-up    Why is this important?   Screening tests can find diseases early when they are easier to treat.  Your doctor or nurse will talk with you about which tests are important for you.  Getting shots for common diseases like the flu and shingles will help prevent them.  11/27/22: PCP f/u appt scheduled  Patient verbalizes understanding of instructions and care plan provided today and agrees to view in MyChart. Active MyChart status and patient understanding of how to access instructions and care plan via MyChart confirmed with patient.     The Managed Medicaid care management team will reach out to the patient again over the next 60 business  days.  The  Patient  has been provided with contact information for the Managed Medicaid care management team and has been advised to call with any health related questions or concerns.   [name] RN, BSN [place]  [employer] -  Managed Medicaid High Risk [phone]   Following is a copy of your plan of care:  Care Plan : RN Care Manager Plan of Care  Updates made by [name], RN since 11/27/2022 12:00 AM     Problem: Health Promotion or Disease Self-Management (General Plan of Care)      Long-Range Goal: Chronic Disease Management   Start Date: 08/23/2022  Expected End Date: 11/21/2022  Priority: High  Note:   Current Barriers:  Knowledge Deficits related to plan of care for management of Anxiety, Depression,  allergic rhinitis, and PNES, tobacco use  Chronic Disease Management support and education needs related to Anxiety, Depression, Allergic Rhinitis, and PNES, tobacco use  11/27/22:  No complaints today-no seizure activity.  Jaime Flores doing better.  Smokes 1/2 ppd.  RNCM Clinical Goal(s):  Patient will verbalize understanding of plan for management of Anxiety, Depression, Allergic Rhinitis, and PNES, tobacco use  as evidenced by patient report verbalize basic understanding of  Anxiety, Depression, Allergic Rhinitis, and PNES, tobacco use  disease process and self health management plan as evidenced by patient report take all medications exactly as prescribed and will call provider for medication related questions as evidenced by patient report demonstrate understanding of rationale for each prescribed medication as evidenced by patient report attend all scheduled medical appointments as evidenced by patient report and EMR review demonstrate Ongoing adherence to prescribed treatment plan for Anxiety, Depression, Allergic Rhinitis, and PNES, tobacco use as evidenced by patient report and EMR review continue to work with RN Care Manager to address care management and care coordination needs related to  Anxiety, Depression, Allergic Rhinitis, and PNES as evidenced by adherence to CM Team Scheduled appointments through collaboration with RN Care manager, provider, and care team.   Interventions: Inter-disciplinary care team collaboration (see longitudinal plan of care) Evaluation of current treatment plan related to  self management and patient's adherence to plan as established by provider  Smoking Cessation Interventions:  (Status:  New goal.) Long Term Goal Reviewed smoking history:  ; currently smoking 1/2 ppd On a scale of 1-10, reports MOTIVATION to quit is 1 On a scale of 1-10, reports CONFIDENCE in quitting is 1 09/24/22:  Patient is not interested in stopping right now.  Evaluation of current  treatment plan reviewed Advised patient to discuss smoking cessation options with provider Provided contact information for [place] Quit Line (1-800-QUIT-NOW) Discussed plans with patient for ongoing care management follow up and provided patient with direct contact information for care management team Assessed social determinant of health barriers     (Status:  New goal.)  Long Term Goal Evaluation of current treatment plan related to Anxiety, Depression, Allergic Rhinitis, and PNES , self-management and patient's adherence to plan as established by provider. Discussed plans with patient for ongoing care management follow up and provided patient with direct contact information for care management team Evaluation of current treatment plan related to anxiety, depression, rhinitis, PNES  and patient's adherence to plan as established by provider Reviewed medications with patient Reviewed scheduled/upcoming provider appointments  Discussed plans with patient for ongoing care management follow up and provided patient with direct contact information for care management team Assessed social determinant of health barriers  Patient Goals/Self-Care Activities: Take all medications as prescribed Attend all scheduled provider appointments Call pharmacy for medication refills 3-7 days in advance of running out of medications Perform all self care activities independently  Perform IADL's (shopping, preparing meals, housekeeping, managing finances) independently Call provider office for new  concerns or questions   Follow Up Plan:  The patient has been provided with contact information for the care management team and has been advised to call with any health related questions or concerns.  The care management team will reach out to the patient again over the next 60 business  days.

## 2022-11-29 ENCOUNTER — Encounter: Payer: Self-pay | Admitting: Family Medicine

## 2022-12-04 ENCOUNTER — Ambulatory Visit (INDEPENDENT_AMBULATORY_CARE_PROVIDER_SITE_OTHER): Payer: Medicaid Other | Admitting: Adult Health

## 2022-12-04 ENCOUNTER — Encounter: Payer: Self-pay | Admitting: Adult Health

## 2022-12-04 ENCOUNTER — Other Ambulatory Visit (HOSPITAL_COMMUNITY)
Admission: RE | Admit: 2022-12-04 | Discharge: 2022-12-04 | Disposition: A | Discharge status: Home / Self Care | Payer: Medicaid Other | Source: Ambulatory Visit | Attending: Adult Health | Admitting: Adult Health

## 2022-12-04 ENCOUNTER — Ambulatory Visit: Payer: Medicaid Other | Admitting: Adult Health

## 2022-12-04 VITALS — BP 108/63 | HR 68 | Ht 62.5 in | Wt 153.0 lb

## 2022-12-04 DIAGNOSIS — Z0000 Encounter for general adult medical examination without abnormal findings: Secondary | ICD-10-CM

## 2022-12-04 NOTE — Progress Notes (Signed)
Patient ID: Jaime Flores, female   DOB: 1985/08/12, 37 y.o.   MRN: [id number] History of Present Illness: Jaime Flores is a 37 year old white female, married, G2P1102 in for a well woman gyn exam and pap.  PCP is Dr Louanne Skye.   Current Medications, Allergies, Past Medical History, Past Surgical History, Family History and Social History were reviewed in [employer] record.     Review of Systems: Patient denies any headaches, hearing loss, fatigue, blurred vision, shortness of breath, chest pain, abdominal pain, problems with bowel movements, urination, or intercourse. No joint pain or mood swings.     Physical Exam:BP 108/63 (BP Location: Left Arm, Patient Position: Sitting, Cuff Size: Normal)   Pulse 68   Ht 5' 2.5" (1.588 m)   Wt 153 lb (69.4 kg)   LMP 11/05/2022   BMI 27.54 kg/m   General:  Well developed, well nourished, no acute distress Skin:  Warm and dry Neck:  Midline trachea, normal thyroid, good ROM, no lymphadenopathy Lungs; Clear to auscultation bilaterally Breast:  No dominant palpable mass, retraction, or nipple discharge, has comedone left breast at 5 0' clock, she said bump that popped about same spot  Cardiovascular: Regular rate and rhythm Abdomen:  Soft, non tender, no hepatosplenomegaly Pelvic:  External genitalia is normal in appearance, no lesions.  The vagina is normal in appearance. Urethra has no lesions or masses. The cervix is bulbous.Pap with HR HPV genotyping performed, cervix was friable with EC brush.  Uterus is felt to be normal size, shape, and contour.  No adnexal masses or tenderness noted.Bladder is non tender, no masses felt. Extremities/musculoskeletal:  No swelling or varicosities noted, no clubbing or cyanosis Psych:  No mood changes, alert and cooperative,seems happy AA is 0 Fall risk is low    12/04/2022    1:39 PM 09/26/2022    2:02 PM 09/26/2022    2:01 PM  Depression screen PHQ 2/9  Decreased Interest 0  0  Down,  Depressed, Hopeless 0  1  PHQ - 2 Score 0  1  Altered sleeping 2 2   Tired, decreased energy 2 2   Change in appetite 0 2   Feeling bad or failure about yourself  0 0   Trouble concentrating 0 0   Moving slowly or fidgety/restless 0 1   Suicidal thoughts 0 0   PHQ-9 Score 4    Difficult doing work/chores  Somewhat difficult        12/04/2022    1:39 PM 09/26/2022    2:02 PM 07/18/2022    9:04 AM 01/03/2022   10:43 AM  GAD 7 : Generalized Anxiety Score  Nervous, Anxious, on Edge 0 1 2 1   Control/stop worrying 0 0 0 2  Worry too much - different things 0 0 2 3  Trouble relaxing 1 1 2 3   Restless 0 0 0 3  Easily annoyed or irritable 0 1 1 3   Afraid - awful might happen 0 0 0 3  Total GAD 7 Score 1 3 7 18   Anxiety Difficulty  Somewhat difficult Somewhat difficult Extremely difficult      Upstream - 12/04/22 1347       Pregnancy Intention Screening   Does the patient want to become pregnant in the next year? No    Does the patient's partner want to become pregnant in the next year? No    Would the patient like to discuss contraceptive options today? No  Contraception Wrap Up   Current Method Female Sterilization    End Method Female Sterilization             Examination chaperoned by Malachy Mood LPN  Impression and Plan 1. Routine general medical examination at a health care facility Pap sent Physical with PCP Pap in 3 years if normal Labs with PCP - Cytology - PAP( [facility])

## 2022-12-12 LAB — CYTOLOGY - PAP
Comment: NEGATIVE
Comment: NEGATIVE
Comment: NEGATIVE
Diagnosis: UNDETERMINED — AB
HPV 16: NEGATIVE
HPV 18 / 45: NEGATIVE
High risk HPV: POSITIVE — AB

## 2022-12-13 ENCOUNTER — Other Ambulatory Visit: Payer: Self-pay | Admitting: Adult Health

## 2022-12-13 DIAGNOSIS — R87610 Atypical squamous cells of undetermined significance on cytologic smear of cervix (ASC-US): Secondary | ICD-10-CM

## 2022-12-13 MED ORDER — METRONIDAZOLE 500 MG PO TABS: 500.0000 mg | ORAL_TABLET | Freq: Two times a day (BID) | ORAL | 0 refills | Status: DC

## 2022-12-31 ENCOUNTER — Encounter: Payer: Self-pay | Admitting: Family Medicine

## 2022-12-31 ENCOUNTER — Telehealth (INDEPENDENT_AMBULATORY_CARE_PROVIDER_SITE_OTHER): Payer: Medicaid Other | Admitting: Family Medicine

## 2022-12-31 DIAGNOSIS — A084 Viral intestinal infection, unspecified: Secondary | ICD-10-CM

## 2022-12-31 MED ORDER — LOPERAMIDE HCL 2 MG PO CAPS: 2.0000 mg | ORAL_CAPSULE | ORAL | 0 refills | Status: DC | PRN

## 2022-12-31 MED ORDER — ONDANSETRON 4 MG PO TBDP: 4.0000 mg | ORAL_TABLET | Freq: Three times a day (TID) | ORAL | 0 refills | Status: DC | PRN

## 2022-12-31 NOTE — Progress Notes (Signed)
Virtual Visit via MyChart video note  I connected with Jaime Flores on 12/31/22 at 1645 by video and verified that I am speaking with the correct person using two identifiers. Jaime Flores is currently located at work and patient are currently with her during visit. The provider, Elige Radon Jezebelle Ledwell, MD is located in their office at time of visit.  Call ended at 1656  I discussed the limitations, risks, security and privacy concerns of performing an evaluation and management service by video and the availability of in person appointments. I also discussed with the patient that there may be a patient responsible charge related to this service. The patient expressed understanding and agreed to proceed.   History and Present Illness: Patient is calling in for nausea and not feeling well. This started 2 days ago.  She was with kids last weekend.  She was tired and irritable. Then yesterday she felt headache and congestion and sinus pressure. She is not sleeping as well. She has a lot of diarrhea. She did call out yesterday and had to go into work today.  She had a stepfather who had cold virus. She is nauseated.  She has hot and cold. She has not used any otc meds yet.   1. Viral gastroenteritis     Outpatient Encounter Medications as of 12/31/2022  Medication Sig   loperamide (IMODIUM) 2 MG capsule Take 1 capsule (2 mg total) by mouth as needed for diarrhea or loose stools.   ondansetron (ZOFRAN-ODT) 4 MG disintegrating tablet Take 1 tablet (4 mg total) by mouth every 8 (eight) hours as needed for nausea or vomiting.   cyclobenzaprine (FLEXERIL) 10 MG tablet Take 1 tablet (10 mg total) by mouth 3 (three) times daily as needed for muscle spasms.   fluticasone (FLONASE) 50 MCG/ACT nasal spray Place 2 sprays into both nostrils daily.   metroNIDAZOLE (FLAGYL) 500 MG tablet Take 1 tablet (500 mg total) by mouth 2 (two) times daily.   sertraline (ZOLOFT) 100 MG tablet Take 1.5 tablets (150  mg total) by mouth at bedtime.   traZODone (DESYREL) 100 MG tablet Take 1-1.5 tablets (100-150 mg total) by mouth as needed for sleep.   No facility-administered encounter medications on file as of 12/31/2022.    Review of Systems  Constitutional:  Negative for chills and fever.  Eyes:  Negative for visual disturbance.  Respiratory:  Negative for chest tightness and shortness of breath.   Cardiovascular:  Negative for chest pain and leg swelling.  Gastrointestinal:  Positive for abdominal pain, diarrhea and nausea. Negative for vomiting.  Musculoskeletal:  Negative for back pain and gait problem.  Skin:  Negative for rash.  Neurological:  Negative for dizziness, light-headedness and headaches.  Psychiatric/Behavioral:  Negative for agitation and behavioral problems.   All other systems reviewed and are negative.   Observations/Objective: Patient sounds comfortable and in no acute distress  Assessment and Plan: Problem List Items Addressed This Visit   None Visit Diagnoses     Viral gastroenteritis    -  Primary   Relevant Medications   loperamide (IMODIUM) 2 MG capsule   ondansetron (ZOFRAN-ODT) 4 MG disintegrating tablet       Immodium as needed and zofran Follow up plan: Return if symptoms worsen or fail to improve.     I discussed the assessment and treatment plan with the patient. The patient was provided an opportunity to ask questions and all were answered. The patient agreed with the plan and demonstrated  an understanding of the instructions.   The patient was advised to call back or seek an in-person evaluation if the symptoms worsen or if the condition fails to improve as anticipated.  The above assessment and management plan was discussed with the patient. The patient verbalized understanding of and has agreed to the management plan. Patient is aware to call the clinic if symptoms persist or worsen. Patient is aware when to return to the clinic for a follow-up  visit. Patient educated on when it is appropriate to go to the emergency department.    I provided 11 minutes of non-face-to-face time during this encounter.    Nils Pyle, MD

## 2023-01-22 ENCOUNTER — Emergency Department (HOSPITAL_COMMUNITY)
Admission: EM | Admit: 2023-01-22 | Discharge: 2023-01-22 | Disposition: A | Discharge status: Home / Self Care | Payer: Medicaid Other | Attending: Emergency Medicine | Admitting: Emergency Medicine

## 2023-01-22 DIAGNOSIS — F17210 Nicotine dependence, cigarettes, uncomplicated: Secondary | ICD-10-CM | POA: Diagnosis not present

## 2023-01-22 DIAGNOSIS — F445 Conversion disorder with seizures or convulsions: Secondary | ICD-10-CM | POA: Diagnosis not present

## 2023-01-22 DIAGNOSIS — R569 Unspecified convulsions: Secondary | ICD-10-CM | POA: Insufficient documentation

## 2023-01-22 DIAGNOSIS — E876 Hypokalemia: Secondary | ICD-10-CM

## 2023-01-22 LAB — CBC WITH DIFFERENTIAL/PLATELET
Abs Immature Granulocytes: 0.01 10*3/uL (ref 0.00–0.07)
Basophils Absolute: 0 10*3/uL (ref 0.0–0.1)
Basophils Relative: 0 %
Eosinophils Absolute: 0.1 10*3/uL (ref 0.0–0.5)
Eosinophils Relative: 2 %
HCT: 36.9 % (ref 36.0–46.0)
Hemoglobin: 11.6 g/dL — ABNORMAL LOW (ref 12.0–15.0)
Immature Granulocytes: 0 %
Lymphocytes Relative: 31 %
Lymphs Abs: 2.3 10*3/uL (ref 0.7–4.0)
MCH: 28.6 pg (ref 26.0–34.0)
MCHC: 31.4 g/dL (ref 30.0–36.0)
MCV: 91.1 fL (ref 80.0–100.0)
Monocytes Absolute: 0.3 10*3/uL (ref 0.1–1.0)
Monocytes Relative: 4 %
Neutro Abs: 4.5 10*3/uL (ref 1.7–7.7)
Neutrophils Relative %: 63 %
Platelets: 329 10*3/uL (ref 150–400)
RBC: 4.05 MIL/uL (ref 3.87–5.11)
RDW: 15.5 % (ref 11.5–15.5)
WBC: 7.2 10*3/uL (ref 4.0–10.5)
nRBC: 0 % (ref 0.0–0.2)

## 2023-01-22 LAB — CBG MONITORING, ED: Glucose-Capillary: 119 mg/dL — ABNORMAL HIGH (ref 70–99)

## 2023-01-22 LAB — BASIC METABOLIC PANEL
Anion gap: 7 (ref 5–15)
BUN: 7 mg/dL (ref 6–20)
CO2: 25 mmol/L (ref 22–32)
Calcium: 8.6 mg/dL — ABNORMAL LOW (ref 8.9–10.3)
Chloride: 104 mmol/L (ref 98–111)
Creatinine, Ser: 0.69 mg/dL (ref 0.44–1.00)
GFR, Estimated: >60 mL/min (ref >60)
Glucose, Bld: 118 mg/dL — ABNORMAL HIGH (ref 70–99)
Potassium: 3 mmol/L — ABNORMAL LOW (ref 3.5–5.1)
Sodium: 136 mmol/L (ref 135–145)

## 2023-01-22 LAB — PREGNANCY, URINE: Preg Test, Ur: NEGATIVE

## 2023-01-22 MED ORDER — SODIUM CHLORIDE 0.9 % IV BOLUS
1000.0000 mL | Freq: Once | INTRAVENOUS | Status: AC
  Administered 2023-01-22: 1000 mL via INTRAVENOUS

## 2023-01-22 MED ORDER — POTASSIUM CHLORIDE CRYS ER 20 MEQ PO TBCR
40.0000 meq | EXTENDED_RELEASE_TABLET | Freq: Once | ORAL | Status: AC
  Administered 2023-01-22: 40 meq via ORAL
  Filled 2023-01-22: qty 2

## 2023-01-22 NOTE — ED Triage Notes (Signed)
Patient at work had witnessed seizure in [employer] office
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
## 2023-01-22 NOTE — Discharge Instructions (Addendum)
Pleasure taking care of you today.  Your workup showed low potassium which we have repleted otherwise it was reassuring.  Make sure you are eating and drinking foods high in potassium such as bananas, orange juice, potatoes, milk, cantaloupe.  Follow-up closely with your primary care doctor and your neurologist, come back to the ER if you have new or worsening symptoms.

## 2023-01-23 ENCOUNTER — Other Ambulatory Visit: Payer: Self-pay

## 2023-01-23 ENCOUNTER — Emergency Department (HOSPITAL_COMMUNITY)
Admission: EM | Admit: 2023-01-23 | Discharge: 2023-01-24 | Disposition: A | Discharge status: Home / Self Care | Payer: Medicaid Other | Attending: Emergency Medicine | Admitting: Emergency Medicine

## 2023-01-23 DIAGNOSIS — F32A Depression, unspecified: Secondary | ICD-10-CM | POA: Diagnosis not present

## 2023-01-23 DIAGNOSIS — F445 Conversion disorder with seizures or convulsions: Secondary | ICD-10-CM | POA: Diagnosis not present

## 2023-01-23 DIAGNOSIS — R457 State of emotional shock and stress, unspecified: Secondary | ICD-10-CM | POA: Diagnosis not present

## 2023-01-23 DIAGNOSIS — R569 Unspecified convulsions: Secondary | ICD-10-CM | POA: Diagnosis not present

## 2023-01-23 NOTE — ED Triage Notes (Signed)
Pt BIB [facility] for unwitnessed seizure. Upon EMS arrival pt A&Ox4.   Was seen here yesterday for Psychogenic nonepileptic seizure.

## 2023-01-24 ENCOUNTER — Encounter: Payer: Self-pay | Admitting: Neurology

## 2023-01-24 MED ORDER — ACETAMINOPHEN 325 MG PO TABS
650.0000 mg | ORAL_TABLET | Freq: Once | ORAL | Status: AC
  Administered 2023-01-24: 650 mg via ORAL
  Filled 2023-01-24: qty 2

## 2023-01-24 NOTE — ED Provider Notes (Signed)
[place] EMERGENCY DEPARTMENT AT [facility]  Provider Note  CSN: [id number] Arrival date & time: 01/23/23 2217  History Chief Complaint  Patient presents with   Seizures    Jaime Flores is a 37 y.o. 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 He reports she had a seizure-like episode earlier today. Was alert again afterwards and then had another. She was seen in the ED yesterday for similar and had an essentially negative workup then, K was mildly decreased, repleted then. She had been doing well without any episodes in the last several months. She recently started a new job but reports it is not stressful. She has been taking her Zoloft as prescribed.     Home Medications Prior to Admission medications   Medication Sig Start Date End Date Taking? Authorizing Provider  cyclobenzaprine (FLEXERIL) 10 MG tablet Take 1 tablet (10 mg total) by mouth 3 (three) times daily as needed for muscle spasms. 11/12/22   [name], Elige Radon, MD  fluticasone (FLONASE) 50 MCG/ACT nasal spray Place 2 sprays into both nostrils daily. 11/12/22   [name], Elige Radon, MD  loperamide (IMODIUM) 2 MG capsule Take 1 capsule (2 mg total) by mouth as needed for diarrhea or loose stools. 12/31/22   [name], Elige Radon, MD  metroNIDAZOLE (FLAGYL) 500 MG tablet Take 1 tablet (500 mg total) by mouth 2 (two) times daily. 12/13/22   Adline Potter, NP  ondansetron (ZOFRAN-ODT) 4 MG disintegrating tablet Take 1 tablet (4 mg total) by mouth every 8 (eight) hours as needed for nausea or vomiting. 12/31/22   [name], Elige Radon, MD  sertraline (ZOLOFT) 100 MG tablet Take 1.5 tablets (150 mg total) by mouth at bedtime. 09/26/22   [name], Elige Radon, MD  traZODone (DESYREL) 100 MG tablet Take 1-1.5 tablets (100-150 mg total) by mouth as needed for sleep. 06/08/22   [name], Elige Radon, MD     Allergies    Catfish [fish allergy], Doxycycline,  Wellbutrin [bupropion], and Tape   Review of Systems   Review of Systems Please see HPI for pertinent positives and negatives  Physical Exam BP 109/68   Pulse 61   Temp 98.3 F (36.8 C) (Oral)   Resp 20   SpO2 97%   Physical Exam Vitals and nursing note reviewed.  HENT:     Head: Normocephalic.     Nose: Nose normal.  Eyes:     Extraocular Movements: Extraocular movements intact.  Pulmonary:     Effort: Pulmonary effort is normal.  Musculoskeletal:        General: Normal range of motion.     Cervical back: Neck supple.  Skin:    Findings: No rash (on exposed skin).  Neurological:     General: No focal deficit present.     Mental Status: She is alert and oriented to person, place, and time.  Psychiatric:        Mood and Affect: Mood normal.     ED Results / Procedures / Treatments   EKG None  Procedures Procedures  Medications Ordered in the ED Medications  acetaminophen (TYLENOL) tablet 650 mg (has no administration in time range)    Initial Impression and Plan  Patient with PNES here for recheck after another episode today. She is at baseline now, complaining only of mild headache. Advised no additional ED workup is indicated today. Recommend she continue her medications and follow up with  Neurology. RTED for any other concerns.   ED Course       MDM Rules/Calculators/A&P Medical Decision Making Problems Addressed: Psychogenic nonepileptic seizure: chronic illness or injury with exacerbation, progression, or side effects of treatment     Final Clinical Impression(s) / ED Diagnoses Final diagnoses:  Psychogenic nonepileptic seizure    Rx / DC Orders ED Discharge Orders     None        Pollyann Savoy, MD 01/24/23 [id number]

## 2023-01-25 ENCOUNTER — Telehealth: Payer: Self-pay | Admitting: Neurology

## 2023-01-25 ENCOUNTER — Encounter: Payer: Self-pay | Admitting: Family

## 2023-01-25 ENCOUNTER — Other Ambulatory Visit: Payer: Medicaid Other | Admitting: Obstetrics and Gynecology

## 2023-01-25 ENCOUNTER — Telehealth (INDEPENDENT_AMBULATORY_CARE_PROVIDER_SITE_OTHER): Payer: Medicaid Other | Admitting: Family

## 2023-01-25 ENCOUNTER — Encounter: Payer: Self-pay | Admitting: Obstetrics and Gynecology

## 2023-01-25 DIAGNOSIS — F419 Anxiety disorder, unspecified: Secondary | ICD-10-CM | POA: Diagnosis not present

## 2023-01-25 DIAGNOSIS — Z09 Encounter for follow-up examination after completed treatment for conditions other than malignant neoplasm: Secondary | ICD-10-CM | POA: Diagnosis not present

## 2023-01-25 DIAGNOSIS — F445 Conversion disorder with seizures or convulsions: Secondary | ICD-10-CM

## 2023-01-25 NOTE — Patient Instructions (Signed)
Hey Jaime Flores, I am glad I was able to speak with you today.  I hope you have a nice day and weekend.    Jaime Flores was given information about Medicaid Managed Care team care coordination services as a part of their Healthy [facility] Medicaid benefit. Jaime Flores verbally consented to engagement with the [facility] Managed Care team.   If you are experiencing a medical emergency, please call 911 or report to your local emergency department or urgent care.   If you have a non-emergency medical problem during routine business hours, please contact your provider's office and ask to speak with a nurse.   For questions related to your Healthy [facility] health plan, please call: [phone] or visit the homepage here: [email or web address]  If you would like to schedule transportation through your Healthy [facility] plan, please call the following number at least 2 days in advance of your appointment: [phone]  For information about your ride after you set it up, call Ride Assist at [phone]. Use this number to activate a Will Call pickup, or if your transportation is late for a scheduled pickup. Use this number, too, if you need to make a change or cancel a previously scheduled reservation.  If you need transportation services right away, call [phone]. The after-hours call center is staffed 24 hours to handle ride assistance and urgent reservation requests (including discharges) 365 days a year. Urgent trips include sick visits, hospital discharge requests and life-sustaining treatment.  Call the [facility] Line at [phone], at any time, 24 hours a day, 7 days a week. If you are in danger or need immediate medical attention call 911.  If you would like help to quit smoking, call 1-800-QUIT-NOW ((908)648-7059) OR Espaol: 1-855-Djelo-Ya (7-564-332-9518) o para ms informacin haga clic aqu or Text READY to 841-660 to register  via text  Jaime Flores - following are the goals we discussed in your visit today:   Goals Addressed    Timeframe:  Long-Range Goal Priority:  High Start Date: 08/23/22                            Expected End Date: ongoing                      Follow Up Date 02/25/23   - practice safe sex - schedule appointment for flu shot - schedule appointment for vaccines needed due to my age or health - schedule recommended health tests (blood work, mammogram, colonoscopy, pap test) - schedule and keep appointment for annual check-up    Why is this important?   Screening tests can find diseases early when they are easier to treat.  Your doctor or nurse will talk with you about which tests are important for you.  Getting shots for common diseases like the flu and shingles will help prevent them.  01/25/23:  PCP today, [facility] 7/17  Patient verbalizes understanding of instructions and care plan provided today and agrees to view in MyChart. Active MyChart status and patient understanding of how to access instructions and care plan via MyChart confirmed with patient.     The Managed Medicaid care management team will reach out to the patient again over the next 60 business  days.  The  Patient has been provided with contact information for the Managed Medicaid care management team and has been advised to call with any health related questions or concerns.  [name] RN, BSN [place]  [facility] Care Management Coordinator - Managed Medicaid High Risk [phone]   Following is a copy of your plan of care:  Care Plan : RN Care Manager Plan of Care  Updates made by [name], RN since 01/25/2023 12:00 AM     Problem: Health Promotion or Disease Self-Management (General Plan of Care)      Long-Range Goal: Chronic Disease Management   Start Date: 08/23/2022  Expected End Date: 04/27/2023  Priority: High  Note:   Current Barriers:  Knowledge Deficits related to plan of care  for management of Anxiety, Depression, allergic rhinitis, and PNES, tobacco use  Chronic Disease Management support and education needs related to Anxiety, Depression, Allergic Rhinitis, and PNES, tobacco use  01/25/23:  Increase seizure activity, Seen in ED 7/2 and 7/3.  Has PCP appt today and will f/u with [facility] today.  No change in smoking 1/2 ppd.  Declines LCSW.  Started new job with [employer] at [employer].  RNCM Clinical Goal(s):  Patient will verbalize understanding of plan for management of Anxiety, Depression, Allergic Rhinitis, and PNES, tobacco use  as evidenced by patient report verbalize basic understanding of  Anxiety, Depression, Allergic Rhinitis, and PNES, tobacco use  disease process and self health management plan as evidenced by patient report take all medications exactly as prescribed and will call provider for medication related questions as evidenced by patient report demonstrate understanding of rationale for each prescribed medication as evidenced by patient report attend all scheduled medical appointments as evidenced by patient report and EMR review demonstrate Ongoing adherence to prescribed treatment plan for Anxiety, Depression, Allergic Rhinitis, and PNES, tobacco use as evidenced by patient report and EMR review continue to work with RN Care Manager to address care management and care coordination needs related to  Anxiety, Depression, Allergic Rhinitis, and PNES as evidenced by adherence to CM Team Scheduled appointments through collaboration with RN Care manager, provider, and care team.   Interventions: Inter-disciplinary care team collaboration (see longitudinal plan of care) Evaluation of current treatment plan related to  self management and patient's adherence to plan as established by provider  Smoking Cessation Interventions:  (Status:  New goal.) Long Term Goal Reviewed smoking history:  ; currently smoking 1/2 ppd On a scale of 1-10, reports  MOTIVATION to quit is 1 On a scale of 1-10, reports CONFIDENCE in quitting is 1 09/24/22:  Patient is not interested in stopping right now.  Evaluation of current treatment plan reviewed Advised patient to discuss smoking cessation options with provider Provided contact information for [place] Quit Line (1-800-QUIT-NOW) Discussed plans with patient for ongoing care management follow up and provided patient with direct contact information for care management team Assessed social determinant of health barriers     (Status:  New goal.)  Long Term Goal Evaluation of current treatment plan related to Anxiety, Depression, Allergic Rhinitis, and PNES , self-management and patient's adherence to plan as established by provider. Discussed plans with patient for ongoing care management follow up and provided patient with direct contact information for care management team Evaluation of current treatment plan related to anxiety, depression, rhinitis, PNES  and patient's adherence to plan as established by provider Reviewed medications with patient Reviewed scheduled/upcoming provider appointments  Discussed plans with patient for ongoing care management follow up and provided patient with direct contact information for care management team Assessed social determinant of health barriers  Patient Goals/Self-Care Activities: Take all medications as  prescribed Attend all scheduled provider appointments Call pharmacy for medication refills 3-7 days in advance of running out of medications Perform all self care activities independently  Perform IADL's (shopping, preparing meals, housekeeping, managing finances) independently Call provider office for new concerns or questions  01/25/23:  patient to f/u with PCP and [facility] today.  Follow Up Plan:  The patient has been provided with contact information for the care management team and has been advised to call with any health related questions or concerns.  The care  management team will reach out to the patient again over the next 60 business  days.

## 2023-01-25 NOTE — Telephone Encounter (Signed)
Reply sent on MyChart 

## 2023-01-25 NOTE — Patient Outreach (Signed)
Medicaid Managed Care   Nurse Care Manager Note  01/25/2023 Name:  Jaime Flores MRN:  [id number] DOB:  Dec 13, 1985  Jaime Flores is an 37 y.o. year old female who is Flores primary patient of [name], Elige Radon, MD.  The Medicaid Managed Care Coordination team was consulted for assistance with:    Chronic healthcare management needs, tobacco use, PNES  Jaime Flores was given information about Medicaid Managed Care Coordination team services today. Jaime Flores Patient agreed to services and verbal consent obtained.  Engaged with patient by telephone for follow up visit in response to provider referral for case management and/or care coordination services.   Assessments/Interventions:  Review of past medical history, allergies, medications, health status, including review of consultants reports, laboratory and other test data, was performed as part of comprehensive evaluation and provision of chronic care management services.  SDOH (Social Determinants of Health) assessments and interventions performed: SDOH Interventions    Flowsheet Row Patient Outreach Telephone from 01/25/2023 in [place] [facility] Patient Outreach Telephone from 11/27/2022 in [place] POP[facility]ATION [facility] DEPARTMENT Patient Outreach Telephone from 10/19/2022 in [place] POPULATION HEALTH DEPARTMENT Office Visit from 09/26/2022 in [facility] Patient Outreach Telephone from 09/24/2022 in [place] POPULATION HEALTH DEPARTMENT Patient Outreach Telephone from 08/23/2022 in [place] POPULATION HEALTH DEPARTMENT  SDOH Interventions        Food Insecurity Interventions Intervention Not Indicated Intervention Not Indicated -- -- -- --  Transportation Interventions -- Intervention Not Indicated -- -- -- --  Alcohol Usage Interventions -- -- -- -- -- Intervention Not Indicated (Score <7)  Depression Interventions/Treatment  -- -- -- Medication -- --  Financial Strain  Interventions -- -- Intervention Not Indicated -- -- --  Physical Activity Interventions -- -- -- -- Intervention Not Indicated --  Stress Interventions -- -- -- -- -- Other (Comment)  [patient states she is on medication-declined services]  Social Connections Interventions -- -- Intervention Not Indicated -- -- --     Care Plan  Allergies  Allergen Reactions   Catfish [Fish Allergy] Shortness Of Breath   Doxycycline Shortness Of Breath   Wellbutrin [Bupropion] Shortness Of Breath, Nausea Only and Palpitations   Tape Swelling    Plastic tape please use paper    Medications Reviewed Today     Reviewed by Jaime Chandler, RN (Registered Nurse) on 01/25/23 at 1013  Med List Status: <None>   Medication Order Taking? Sig Documenting Provider Last Dose Status Informant  cyclobenzaprine (FLEXERIL) 10 MG tablet [id number] No Take 1 tablet (10 mg total) by mouth 3 (three) times daily as needed for muscle spasms. [name], Elige Radon, MD Taking Active   fluticasone [facility]) 50 MCG/ACT nasal spray [id number] No Place 2 sprays into both nostrils daily. [name], Elige Radon, MD Taking Active   loperamide (IMODIUM) 2 MG capsule [id number]  Take 1 capsule (2 mg total) by mouth as needed for diarrhea or loose stools. [name], Elige Radon, MD  Active   metroNIDAZOLE (FLAGYL) 500 MG tablet [id number]  Take 1 tablet (500 mg total) by mouth 2 (two) times daily. Jaime Mourning A, NP  Active   ondansetron (ZOFRAN-ODT) 4 MG disintegrating tablet [id number]  Take 1 tablet (4 mg total) by mouth every 8 (eight) hours as needed for nausea or vomiting. [name], Elige Radon, MD  Active   sertraline (ZOLOFT) 100 MG tablet [id number] No Take 1.5 tablets (150 mg total) by mouth at bedtime. [name], Elige Radon, MD  Taking Active   traZODone (DESYREL) 100 MG tablet [id number] No Take 1-1.5 tablets (100-150 mg total) by mouth as needed for sleep. [name], Elige Radon, MD Taking Active            Patient Active Problem List    Diagnosis Date Noted   ASCUS with positive high risk HPV cervical 12/13/2022   Prediabetes 02/15/2020   Anxiety 11/18/2019   Depression, recurrent (HCC) 11/18/2019   Seasonal and perennial allergic rhinitis 05/15/2019   Amenorrhea 01/27/2013   Conditions to be addressed/monitored per PCP order:  Chronic healthcare management needs, tobacco use, PNES  Care Plan : RN Care Manager Plan of Care  Updates made by Jaime Chandler, RN since 01/25/2023 12:00 AM     Problem: Health Promotion or Disease Self-Management (General Plan of Care)      Long-Range Goal: Chronic Disease Management   Start Date: 08/23/2022  Expected End Date: 04/27/2023  Priority: High  Note:   Current Barriers:  Knowledge Deficits related to plan of care for management of Anxiety, Depression, allergic rhinitis, and PNES, tobacco use  Chronic Disease Management support and education needs related to Anxiety, Depression, Allergic Rhinitis, and PNES, tobacco use  01/25/23:  Increase seizure activity, Seen in ED 7/2 and 7/3.  Has PCP appt today and will f/u with [facility] today.  No change in smoking 1/2 ppd.  Declines LCSW.  Started new job with [employer] at [employer].  RNCM Clinical Goal(s):  Patient will verbalize understanding of plan for management of Anxiety, Depression, Allergic Rhinitis, and PNES, tobacco use  as evidenced by patient report verbalize basic understanding of  Anxiety, Depression, Allergic Rhinitis, and PNES, tobacco use  disease process and self health management plan as evidenced by patient report take all medications exactly as prescribed and will call provider for medication related questions as evidenced by patient report demonstrate understanding of rationale for each prescribed medication as evidenced by patient report attend all scheduled medical appointments as evidenced by patient report and EMR review demonstrate Ongoing adherence to prescribed treatment plan for Anxiety, Depression, Allergic  Rhinitis, and PNES, tobacco use as evidenced by patient report and EMR review continue to work with RN Care Manager to address care management and care coordination needs related to  Anxiety, Depression, Allergic Rhinitis, and PNES as evidenced by adherence to CM Team Scheduled appointments through collaboration with RN Care manager, provider, and care team.   Interventions: Inter-disciplinary care team collaboration (see longitudinal plan of care) Evaluation of current treatment plan related to  self management and patient's adherence to plan as established by provider  Smoking Cessation Interventions:  (Status:  New goal.) Long Term Goal Reviewed smoking history:  ; currently smoking 1/2 ppd On Flores scale of 1-10, reports MOTIVATION to quit is 1 On Flores scale of 1-10, reports CONFIDENCE in quitting is 1 09/24/22:  Patient is not interested in stopping right now.  Evaluation of current treatment plan reviewed Advised patient to discuss smoking cessation options with provider Provided contact information for [place] Quit Line (1-800-QUIT-NOW) Discussed plans with patient for ongoing care management follow up and provided patient with direct contact information for care management team Assessed social determinant of health barriers     (Status:  New goal.)  Long Term Goal Evaluation of current treatment plan related to Anxiety, Depression, Allergic Rhinitis, and PNES , self-management and patient's adherence to plan as established by provider. Discussed plans with patient for ongoing care management follow up and provided patient with  direct contact information for care management team Evaluation of current treatment plan related to anxiety, depression, rhinitis, PNES  and patient's adherence to plan as established by provider Reviewed medications with patient Reviewed scheduled/upcoming provider appointments  Discussed plans with patient for ongoing care management follow up and provided patient with  direct contact information for care management team Assessed social determinant of health barriers  Patient Goals/Self-Care Activities: Take all medications as prescribed Attend all scheduled provider appointments Call pharmacy for medication refills 3-7 days in advance of running out of medications Perform all self care activities independently  Perform IADL's (shopping, preparing meals, housekeeping, managing finances) independently Call provider office for new concerns or questions  01/25/23:  patient to f/u with PCP and NEURO today.  Follow Up Plan:  The patient has been provided with contact information for the care management team and has been advised to call with any health related questions or concerns.  The care management team will reach out to the patient again over the next 60 business  days.    Long-Range Goal: Establish Plan of Care for Chronic Disease Management Needs   Priority: High  Note:   Timeframe:  Long-Range Goal Priority:  High Start Date: 08/23/22                            Expected End Date: ongoing                      Follow Up Date 02/25/23   - practice safe sex - schedule appointment for flu shot - schedule appointment for vaccines needed due to my age or health - schedule recommended health tests (blood work, mammogram, colonoscopy, pap test) - schedule and keep appointment for annual check-up    Why is this important?   Screening tests can find diseases early when they are easier to treat.  Your doctor or nurse will talk with you about which tests are important for you.  Getting shots for common diseases like the flu and shingles will help prevent them.  01/25/23:  PCP today, NEURO 7/17   Follow Up:  Patient agrees to Care Plan and Follow-up.  Plan: The Managed Medicaid care management team will reach out to the patient again over the next 60 business  days. and The  Patient has been provided with contact information for the Managed Medicaid care  management team and has been advised to call with any health related questions or concerns.  Date/time of next scheduled RN care management/care coordination outreach: 02/25/23 at 415

## 2023-01-25 NOTE — Patient Instructions (Signed)
Managing Non-Epileptic Seizures, Adult A non-epileptic seizure is an event that can cause abnormal movements or a loss of consciousness. It is not caused by abnormal electrical and chemical activity in the brain (epileptic seizure). Non-epileptic seizures are caused by an underlying problem with body function (physiologic) or a mental health disorder (psychogenic). If you have been diagnosed with non-epileptic seizures, you can do things to manage your symptoms. You may have to try different things to see what works best for you. Your health care provider may also give you specific instructions. How does this condition affect me? If your seizures are physiologic, your health care provider will treat the cause. These seizures are not likely to return, and they do not need further treatment. If you have psychogenic non-epileptic seizures (PNES), it is very important to understand that your PNES is a real illness. Your seizures are not fake. They just have a different cause than other seizures. PNES can be treated. Work with your mental health provider to find a treatment that works for you. Talk with your health care provider about what activities are safe to do until your seizures are controlled. What actions can I take to manage the condition? Create a plan for how to deal with your seizures. After a seizure, make notes about what was associated with the stress that led to the seizure. Then, create a plan to manage this stress. Think of ways to change the stresses you cannot avoid. How to manage lifestyle changes Managing stress  Certain types of counseling can be very helpful for managing stress. A mental health provider can assess what other treatments may also help you, such as: Talk therapy (cognitive behavioral therapy, or CBT). Through CBT, you will learn to identify and manage the psychological distress that leads to seizures. Medicine to treat depression or anxiety. Biofeedback. This uses  signals from your body (physiologic responses) to help you learn to regulate anxiety. Consider self-care strategies to lower stress levels, such as: Doing breathing exercises, yoga, or meditation. Listening to music. Doing recreational therapy or organized exercise. Giving yourself calming messages. Calling a friend to talk about your stress. Relationships  You may benefit from talking with a trusted friend or family member about your thoughts and feelings. General instructions Learn as much as you can about your non-epileptic seizures. Consider educating the people in your life about your condition. These should be trusted family members and others who spend time with you at work, school, or home. Ask for the emotional support you would like. Keep all follow-up visits. This is important. Follow these instructions at home: Medicines Medicines may be prescribed to treat depression or anxiety that causes non-epileptic seizures. Avoid using alcohol and other substances that may prevent your medicines from working properly (may interact). It is also important to: Talk with your pharmacist or health care provider about all the medicines that you take, their possible side effects, and what medicines are safe to take together. Make it your goal to take part in all treatment decisions (shared decision-making). Ask about possible side effects of medicines that your health care provider recommends, and tell him or her how you feel about having those side effects. It is best if shared decision-making with your health care provider is part of your total treatment plan. Take over-the-counter and prescription medicines only as told by your health care provider. General instructions Ask your health care provider if it is safe for you to drive. Return to your normal activities as told by  your health care provider. Ask your health care provider what activities are safe for you. These include working and  playing sports. Follow all instructions from your health care provider. These may include ways to prevent seizures and what to do if you have a seizure. Eat a balanced diet. Make sure you get full nights of sleep and regular daily exercise. Keep all follow-up visits. This is important. Where to find support You can get support for managing non-epileptic seizures from support groups, either online or in-person. Your health care provider may be able to recommend a support group in your area. Where to find more information Epilepsy Foundation: epilepsy.com American Epilepsy Society: aesnet.org Contact a health care provider if: Your seizures change or happen more often. You continue to have seizures after treatment. You show signs of depression or anxiety. You have trouble doing your normal daily routine or work schedule. Get help right away if: You injure yourself during a seizure. You have one seizure after another. You have trouble recovering from a seizure. You have chest pain or trouble breathing. A seizure lasts longer than 5 minutes. You think about harming yourself or others. These symptoms may represent a serious problem that is an emergency. Do not wait to see if the symptoms will go away. Get medical help right away. Call your local emergency services (911 in the [place] Do not drive yourself to the hospital. If you ever feel like you may hurt yourself or others, or have thoughts about taking your own life, get help right away. Go to your nearest emergency department or: Call your local emergency services (911 in the [place] Call a suicide crisis helpline, such as the National Suicide Prevention Lifeline at 442-159-8887 or 988 in the [place] This is open 24 hours a day in the [place] Text the Crisis Text Line at 959-386-3128 (in the [place] Summary The two types of non-epileptic seizures are physiologic non-epileptic seizures and psychogenic non-epileptic seizures. Work with your mental  health provider to find a treatment that works for you. Learning to recognize and avoid stress is an important part of preventing non-epileptic seizures. This information is not intended to replace advice given to you by your health care provider. Make sure you discuss any questions you have with your health care provider. Document Revised: 02/01/2021 Document Reviewed: 01/19/2020 Elsevier Patient Education  2024 [employer].

## 2023-01-25 NOTE — Progress Notes (Signed)
Virtual Visit Consent   [name], you are scheduled for a virtual visit with a  provider today. Just as with appointments in the office, your consent must be obtained to participate. Your consent will be active for this visit and any virtual visit you may have with one of our providers in the next 365 days. If you have a MyChart account, a copy of this consent can be sent to you electronically.  As this is a virtual visit, video technology does not allow for your provider to perform a traditional examination. This may limit your provider's ability to fully assess your condition. If your provider identifies any concerns that need to be evaluated in person or the need to arrange testing (such as labs, EKG, etc.), we will make arrangements to do so. Although advances in technology are sophisticated, we cannot ensure that it will always work on either your end or our end. If the connection with a video visit is poor, the visit may have to be switched to a telephone visit. With either a video or telephone visit, we are not always able to ensure that we have a secure connection.  By engaging in this virtual visit, you consent to the provision of healthcare and authorize for your insurance to be billed (if applicable) for the services provided during this visit. Depending on your insurance coverage, you may receive a charge related to this service.  I need to obtain your verbal consent now. Are you willing to proceed with your visit today? Jaime Flores has provided verbal consent on 01/25/2023 for a virtual visit (video or telephone). Jannifer Rodney, FNP  Date: 01/25/2023 2:39 PM  Virtual Visit via Video Note   I, Jannifer Rodney, connected with  Jaime Flores  ([id number], 1985-12-22) on 01/25/23 at  3:50 PM EDT by a video-enabled telemedicine application and verified that I am speaking with the correct person using two identifiers.  Location: Patient: Virtual Visit Location Patient:  Home Provider: Virtual Visit Location Provider: Office/Clinic   I discussed the limitations of evaluation and management by telemedicine and the availability of in person appointments. The patient expressed understanding and agreed to proceed.    History of Present Illness: Jaime Flores is a 37 y.o. who identifies as a female who was assigned female at birth, and is being seen today for seizures. She has had 12 psychogenic nonepileptic seizures in the last 4 days. She has been to the ED visits twice this week.   She is followed by Neurologists, but was told to follow up with CBT. She is taking Zoloft 100 mg that has been controlling her anxiety. She just recently started a new job and may be having increased stressed with this. However, she states she does not feel stressed.   HPI: Seizures  This is a recurrent problem. The current episode started more than 2 days ago. There were more than 10 seizures. The most recent episode lasted 2 to 5 minutes. Characteristics include rhythmic jerking. The episode was Witnessed. There was The sensation of an aura present.    Problems:  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   Prediabetes 02/15/2020   Anxiety 11/18/2019   Depression, recurrent (HCC) 11/18/2019   Seasonal and perennial allergic rhinitis 05/15/2019   Amenorrhea 01/27/2013    Allergies:  Allergies  Allergen Reactions   Catfish [Fish Allergy] Shortness Of Breath   Doxycycline Shortness Of  Breath   Wellbutrin [Bupropion] Shortness Of Breath, Nausea Only and Palpitations   Tape Swelling    Plastic tape please use paper   Medications:  Current Outpatient Medications:    cyclobenzaprine (FLEXERIL) 10 MG tablet, Take 1 tablet (10 mg total) by mouth 3 (three) times daily as needed for muscle spasms., Disp: 30 tablet, Rfl: 0   fluticasone (FLONASE) 50 MCG/ACT nasal spray, Place 2 sprays into both nostrils daily.,  Disp: 16 g, Rfl: 0   loperamide (IMODIUM) 2 MG capsule, Take 1 capsule (2 mg total) by mouth as needed for diarrhea or loose stools., Disp: 30 capsule, Rfl: 0   metroNIDAZOLE (FLAGYL) 500 MG tablet, Take 1 tablet (500 mg total) by mouth 2 (two) times daily., Disp: 14 tablet, Rfl: 0   ondansetron (ZOFRAN-ODT) 4 MG disintegrating tablet, Take 1 tablet (4 mg total) by mouth every 8 (eight) hours as needed for nausea or vomiting., Disp: 30 tablet, Rfl: 0   sertraline (ZOLOFT) 100 MG tablet, Take 1.5 tablets (150 mg total) by mouth at bedtime., Disp: 45 tablet, Rfl: 5   traZODone (DESYREL) 100 MG tablet, Take 1-1.5 tablets (100-150 mg total) by mouth as needed for sleep., Disp: 45 tablet, Rfl: 3  Observations/Objective: Patient is well-developed, well-nourished in no acute distress.  Resting comfortably  at home.  Head is normocephalic, atraumatic.  No labored breathing.  Speech is clear and coherent with logical content.  Patient is alert and oriented at baseline.    Assessment and Plan: 1. Anxiety - Ambulatory referral to Psychiatry  2. 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 The patient was provided an opportunity to ask questions and all were answered. The patient agreed with the plan and demonstrated an understanding of the instructions.  A copy of instructions were sent to the patient via MyChart unless otherwise noted below.     The patient was advised to call back or seek an in-person evaluation if the symptoms worsen or if the condition fails to improve as anticipated.  Time:  I spent 14 minutes with the patient via telehealth technology discussing the above problems/concerns.    Jannifer Rodney, FNP

## 2023-01-25 NOTE — Telephone Encounter (Signed)
Patient sent a mychart message to be seen before her 02/06/23 appt. She has been to the ER a few times due to having multiple seizures.

## 2023-01-28 ENCOUNTER — Encounter: Payer: Self-pay | Admitting: Family Medicine

## 2023-01-28 ENCOUNTER — Telehealth: Payer: Self-pay | Admitting: Family Medicine

## 2023-01-28 NOTE — Telephone Encounter (Signed)
[facility] called to let PCP know that pt is requesting therapist only so they are going to give pt info on someone she can contact about it.

## 2023-02-01 NOTE — Telephone Encounter (Signed)
I do not know that I can extend the note to that far without seeing you, what happened, I would have to see you.

## 2023-02-04 ENCOUNTER — Telehealth: Payer: Self-pay | Admitting: Family Medicine

## 2023-02-04 NOTE — Telephone Encounter (Signed)
Making appt

## 2023-02-05 ENCOUNTER — Telehealth: Payer: Medicaid Other | Admitting: Family Medicine

## 2023-02-06 ENCOUNTER — Telehealth (INDEPENDENT_AMBULATORY_CARE_PROVIDER_SITE_OTHER): Payer: Medicaid Other | Admitting: Neurology

## 2023-02-06 ENCOUNTER — Encounter: Payer: Self-pay | Admitting: Neurology

## 2023-02-06 VITALS — Ht 62.0 in | Wt 156.0 lb

## 2023-02-06 DIAGNOSIS — F445 Conversion disorder with seizures or convulsions: Secondary | ICD-10-CM

## 2023-02-06 NOTE — Progress Notes (Signed)
Virtual Visit via Video Note The purpose of this virtual visit is to provide medical care while limiting exposure to the novel coronavirus.    Consent was obtained for video visit:  Yes.   Answered questions that patient had about telehealth interaction:  Yes.    Pt location: Home Physician Location: office Name of referring provider:  [name], Elige Radon, MD I connected with Jaime Flores at patients initiation/request on 02/06/2023 at 10:00 AM EDT by video enabled telemedicine application and verified that I am speaking with the correct person using two identifiers. Pt MRN:  [id number] Pt DOB:  19-Jun-1986 Video Participants:  Jaime Flores   History of Present Illness:  The patient had a virtual video visit on 02/06/2023. 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 She is alone in the visit. On her last visit, she reported doing better. The events had quieted down, she was event-free for 4 months until 01/22/23. She was busy in April doing a lot of traveling for her prior job. 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 She was in the ER on 7/2 and 7/3 for recurrent episodes, and notes that after that she was having them 7-8 times a day. They dropped off around 7/13, but yesterday she had another one. She has not been back to work since 7/2. She states that work has not been stressful. She notes stress with her husband recent change in health condition. She has been unable to find a therapist doing CBT due to insurance coverage.    History on Initial Assessment 01/04/2022: This is a 37 year old right-handed woman with a history of anxiety, depression, psychogenic non-epileptic events presenting for second opinion. 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 There was an  increase in episodes when she was off Celexa. Symptoms started in January 2022, she was in the ER on 08/04/20 for an episode of loss of consciousness, she reported feeling a burning sensation in the back of her head, then lost consciousness. She was back in the ER on 08/17/20 for several episodes of shaking that were witnessed in the ER with no post-event confusion, she was noted to come around with ammonia smelling salts immediately. She was evaluated by neurologist Dr. Terrace Arabia in 08/2020, she reviewed videos of episodes described as eyes closed, lying down, body relaxed. In one video, she appeared asleep with right leg rotating movement, on another video she was on her left side with left hand shaking for a few minutes, semiology consistent with non-epileptic events. She had a normal wake EEG in 08/2020. 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 She was in the ER in 02/23/21 after she zoned out and began shaking in arms and head for 30 seconds, then again on 02/26/21 when she had one in the shower and hit her head. She states that episodes usually start with a burning in her legs, usually on the right then moving to the left. Sometimes there is tingling in both arms before she gets the burning feeling. She feels like Jello, like she does not have control over her  body. She feels very tired and sore afterward. She reports the longest event-free period was 2 months, at one point she had 15 in one day. These have quieted down but recently started increasing again, last night she had 3. He reports she may go a day without having one, then would have them back to back. She has new symptoms recently where she can't talk afterwards, she has it in her brain but they come out jumbled. She reports that when she was at her prior  job, she would make a breathing hiccup noise during the episodes, she was unaware of it. Initially she could hear what was going on, she could hear herself hit the floor but not feel it. Recently she is unaware of her surroundings unless she is touched and spoken to, then she can start to hear. She notes that stress is a trigger, last night she had an argument with her mother right before the episodes. When she was at her prior job at [employer], "they were radical," she has been at her current job for almost a year in August, she works alone at a drive-thru station and her husband has been called that she had an event. She denies any olfactory/gustatory hallucinations, deja vu, rising epigastric sensation, focal numbness/tingling/weakness. Her husband has noticed brief jerks which she is unaware of, he reports she was talking to our receptionist and having jerking. As she came into the exam room, the jerks increased and he lay her on the examination table. As I entered the room, she was lying on her left side with eyes closed, having irregular asynchronous jerking movements that lasted a few more seconds. No post-event confusion, she was able to speak without issues.   She used to have headaches in 2017 but these resolved. She does not have headaches in general but has one today attributed to hitting her head from the episode last night. She gets dizzy when she first wakes up, it slowly gets better. No diplopia, dysarthria/dysphagia, neck pain, bowel/bladder dysfunction. She reports her father had seizures. Her twin sister's youngest child has febrile seizures. She had a concussion at age 87 and lost her childhood memories. She had a normal birth and early development.  There is no history of febrile convulsions, CNS infections such as meningitis/encephalitis, significant traumatic brain injury, neurosurgical procedures.  Current Outpatient Medications on File Prior to Visit  Medication Sig Dispense Refill    cyclobenzaprine (FLEXERIL) 10 MG tablet Take 1 tablet (10 mg total) by mouth 3 (three) times daily as needed for muscle spasms. 30 tablet 0   fluticasone (FLONASE) 50 MCG/ACT nasal spray Place 2 sprays into both nostrils daily. 16 g 0   loperamide (IMODIUM) 2 MG capsule Take 1 capsule (2 mg total) by mouth as needed for diarrhea or loose stools. 30 capsule 0   sertraline (ZOLOFT) 100 MG tablet Take 1.5 tablets (150 mg total) by mouth at bedtime. 45 tablet 5   traZODone (DESYREL) 100 MG tablet Take 1-1.5 tablets (100-150 mg total) by mouth as needed for sleep. 45 tablet 3   No current facility-administered medications on file prior to visit.     Observations/Objective:   Vitals:   02/06/23 0945  Weight: 156 lb (70.8 kg)  Height: 5\' 2"  (1.575 m)   GEN:  The patient appears stated age and is in NAD.  Neurological examination: Patient is awake, alert. No aphasia or dysarthria. Intact fluency and comprehension. Cranial nerves: Extraocular movements intact. No facial asymmetry. Motor: moves  all extremities symmetrically, at least anti-gravity x 4.    Assessment and Plan:   This is a 37 yo RH woman with a history of anxiety, depression, and psychogenic non-epileptic events (PNES). She had an initial improvement in symptoms with reducing stress and anxiety levels, however has had a recent increase in episodes this month which appear to coincide with starting her new job. She states she is not feeling stressed from the new job, however the new changes are likely contributing. We again discussed the importance of CBT and management of anxiety with Psychiatry. Referral will be sent. Follow-up in 6 months, call for any changes.    Follow Up Instructions:   -I discussed the assessment and treatment plan with the patient. The patient was provided an opportunity to ask questions and all were answered. The patient agreed with the plan and demonstrated an understanding of the instructions.   The patient  was advised to call back or seek an in-person evaluation if the symptoms worsen or if the condition fails to improve as anticipated.    Van Clines, MD

## 2023-02-06 NOTE — Patient Instructions (Addendum)
Good to see you.  We will send a referral to [facility] for Psychiatry  2. I will check into other options for CBT accepting Medicaid  3. Follow-up in 6 months, call for any changes

## 2023-02-07 ENCOUNTER — Encounter: Payer: Self-pay | Admitting: Neurology

## 2023-02-07 DIAGNOSIS — Z0279 Encounter for issue of other medical certificate: Secondary | ICD-10-CM

## 2023-02-08 ENCOUNTER — Telehealth: Payer: Medicaid Other | Admitting: Family Medicine

## 2023-02-11 ENCOUNTER — Telehealth: Payer: Self-pay | Admitting: Neurology

## 2023-02-11 NOTE — Telephone Encounter (Signed)
Pt is calling to check the status of the work note stated that it would in her mychart and she went to work today and it was not there in her Jaime Flores which was stated to her on her virtual visit 02/06/2023.  Pt really needs this so that she can go back to work.

## 2023-02-11 NOTE — Telephone Encounter (Signed)
Pt needs a note so she can return to work

## 2023-02-12 ENCOUNTER — Telehealth: Payer: Self-pay | Admitting: Family Medicine

## 2023-02-12 NOTE — Telephone Encounter (Signed)
Patient called stating that the paperwork that was faxed over to [employer] was unclear and needs to be faxed again. Also Patient is needing note to return to work, each day she is out, patient is accumulating points/KB

## 2023-02-12 NOTE — Telephone Encounter (Signed)
  pPt needs a letter to go back to work, pt has been out of work because of seizures, she had FMLA ppw filled out by her neurologist, the date to return to work was 02/11/2023, pt works for [employer] and [employer] requires a return to work note as well, neurologist will not give pt a note because her provider is on vacation and the covering provider will not give her a work note pt was told that the FMLA date to return to work should be enough. 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 Algis Downs. Prescribes anxiety medication which helps with her seizures she could get a return to work note from him. Pt wants to know if he can do this for her? Pt is aware of Dr. Algis Downs. Being off due to broken leg, wants a call back today from nurse

## 2023-02-12 NOTE — Telephone Encounter (Signed)
Refaxed notes to faxed to [employer]

## 2023-02-12 NOTE — Telephone Encounter (Signed)
Informed pt that her note will have to come from Dr. Rosalyn Gess office, this is the doctor who wrote her out of work. I can see that they have encounters that they have faxed her paperwork to [employer] but if she needs a work note she will have to request it from them, we are unable to handle that for her.

## 2023-02-14 ENCOUNTER — Encounter (HOSPITAL_COMMUNITY): Payer: Self-pay | Admitting: Radiology

## 2023-02-14 ENCOUNTER — Other Ambulatory Visit: Payer: Self-pay

## 2023-02-14 ENCOUNTER — Emergency Department (HOSPITAL_COMMUNITY)
Admission: EM | Admit: 2023-02-14 | Discharge: 2023-02-14 | Disposition: A | Discharge status: Home / Self Care | Payer: Medicaid Other | Attending: Emergency Medicine | Admitting: Emergency Medicine

## 2023-02-14 DIAGNOSIS — Z87898 Personal history of other specified conditions: Secondary | ICD-10-CM

## 2023-02-14 DIAGNOSIS — R569 Unspecified convulsions: Secondary | ICD-10-CM | POA: Diagnosis present

## 2023-02-14 DIAGNOSIS — F445 Conversion disorder with seizures or convulsions: Secondary | ICD-10-CM | POA: Diagnosis not present

## 2023-02-14 DIAGNOSIS — G40909 Epilepsy, unspecified, not intractable, without status epilepticus: Secondary | ICD-10-CM | POA: Diagnosis not present

## 2023-02-14 LAB — I-STAT CHEM 8, ED
BUN: <3 mg/dL — ABNORMAL LOW (ref 6–20)
Calcium, Ion: 1.13 mmol/L — ABNORMAL LOW (ref 1.15–1.40)
Chloride: 100 mmol/L (ref 98–111)
Creatinine, Ser: 0.9 mg/dL (ref 0.44–1.00)
Glucose, Bld: 101 mg/dL — ABNORMAL HIGH (ref 70–99)
HCT: 37 % (ref 36.0–46.0)
Hemoglobin: 12.6 g/dL (ref 12.0–15.0)
Potassium: 3.5 mmol/L (ref 3.5–5.1)
Sodium: 140 mmol/L (ref 135–145)
TCO2: 27 mmol/L (ref 22–32)

## 2023-02-14 MED ORDER — ACETAMINOPHEN 325 MG PO TABS
650.0000 mg | ORAL_TABLET | Freq: Once | ORAL | Status: AC
  Administered 2023-02-14: 650 mg via ORAL
  Filled 2023-02-14: qty 2

## 2023-02-14 NOTE — Discharge Instructions (Signed)
Please call your neurologist for follow-up.  Per [facility] statutes, patients with seizures are not allowed to drive until they have been seizure-free for six months. Use caution when using heavy equipment or power tools. Avoid working on ladders or at heights. Take showers instead of baths. Ensure the water temperature is not too high on the home water heater. Do not go swimming alone. Do not lock yourself in a room alone (i.e. bathroom). When caring for infants or small children, sit down when holding, feeding, or changing them to minimize risk of injury to the child in the event you have a seizure. Maintain good sleep hygiene. Avoid alcohol.    If Jaime Flores has another seizure, call 911 and bring them back to the ED if:       A.  The seizure lasts longer than 5 minutes.            B.  The patient doesn't wake shortly after the seizure or has new problems such as difficulty seeing, speaking or moving following the seizure       C.  The patient was injured during the seizure       D.  The patient has a temperature over 102 F (39C)       E.  The patient vomited during the seizure and now is having trouble breathing

## 2023-02-14 NOTE — ED Provider Notes (Signed)
[place] EMERGENCY DEPARTMENT AT [facility] Provider Note   CSN: [id number] Arrival date & time: 02/14/23  1234     History  Chief Complaint  Patient presents with   Seizures    Jaime Flores is a 37 y.o. female.  HPI 37 year old female history of psychogenic seizures presents today with reports that she had a seizure in a break room here at the hospital.  Reportedly several people with her.  However, I am unable to obtain any specific history around what exactly occurred.  She reported to the ED RN on arrival that she has seizures when she gets stressed out.  She cannot remember if she was sitting or standing.  She says that she woke up on the floor.  She denies any significant pain or injury.  She states that she is just coming back to work after being out for seizures on FMLA.     Home Medications Prior to Admission medications   Medication Sig Start Date End Date Taking? Authorizing Provider  cyclobenzaprine (FLEXERIL) 10 MG tablet Take 1 tablet (10 mg total) by mouth 3 (three) times daily as needed for muscle spasms. 11/12/22   [name], Elige Radon, MD  fluticasone (FLONASE) 50 MCG/ACT nasal spray Place 2 sprays into both nostrils daily. 11/12/22   [name], Elige Radon, MD  loperamide (IMODIUM) 2 MG capsule Take 1 capsule (2 mg total) by mouth as needed for diarrhea or loose stools. 12/31/22   [name], Elige Radon, MD  sertraline (ZOLOFT) 100 MG tablet Take 1.5 tablets (150 mg total) by mouth at bedtime. 09/26/22   [name], Elige Radon, MD  traZODone (DESYREL) 100 MG tablet Take 1-1.5 tablets (100-150 mg total) by mouth as needed for sleep. 06/08/22   [name], Elige Radon, MD      Allergies    Catfish [fish allergy], Doxycycline, Wellbutrin [bupropion], and Tape    Review of Systems   Review of Systems  Physical Exam Updated Vital Signs BP (!) 104/46 (BP Location: Right Arm)   Pulse 78   Temp 98.1 F (36.7 C) (Oral)   Resp 16   Ht 1.575 m (5\' 2" )   Wt 70.8 kg    SpO2 98%   BMI 28.53 kg/m  Physical Exam Vitals and nursing note reviewed.  Constitutional:      General: She is not in acute distress.    Appearance: She is well-developed.  HENT:     Head: Normocephalic and atraumatic.     Right Ear: External ear normal.     Left Ear: External ear normal.     Nose: Nose normal.     Mouth/Throat:     Mouth: Mucous membranes are moist.     Pharynx: Oropharynx is clear.  Eyes:     Conjunctiva/sclera: Conjunctivae normal.     Pupils: Pupils are equal, round, and reactive to light.  Cardiovascular:     Rate and Rhythm: Normal rate and regular rhythm.  Pulmonary:     Effort: Pulmonary effort is normal.  Abdominal:     General: Abdomen is flat.     Palpations: Abdomen is soft.  Musculoskeletal:        General: Normal range of motion.     Cervical back: Normal range of motion and neck supple.  Skin:    General: Skin is warm and dry.  Neurological:     General: No focal deficit present.     Mental Status: She is alert and oriented to person, place, and time.  Cranial Nerves: No cranial nerve deficit.     Sensory: No sensory deficit.     Motor: No weakness or abnormal muscle tone.     Coordination: Coordination normal.  Psychiatric:        Behavior: Behavior normal.        Thought Content: Thought content normal.     ED Results / Procedures / Treatments   Labs (all labs ordered are listed, but only abnormal results are displayed) Labs Reviewed  I-STAT CHEM 8, ED - Abnormal; Notable for the following components:      Result Value   BUN <3 (*)    Glucose, Bld 101 (*)    Calcium, Ion 1.13 (*)    All other components within normal limits    EKG None  Radiology No results found.  Procedures Procedures    Medications Ordered in ED Medications  acetaminophen (TYLENOL) tablet 650 mg (650 mg Oral Given 02/14/23 1349)    ED Course/ Medical Decision Making/ A&P                             Medical Decision Making Risk OTC  drugs.   37 year old female with history of psychogenic seizures presents today with report of seizure.  Patient is evaluated here in the ED.  Labs are being obtained.  She is awake alert and oriented without focal neurological deficits.  Patient has no obvious signs of trauma including intraorally I-STAT is obtained and there is no evidence of acute electrolyte abnormality. Chart reviewed and office chart reviewed with the below noted  "pPt needs a letter to go back to work, pt has been out of work because of seizures, she had FMLA ppw filled out by her neurologist, the date to return to work was 02/11/2023, pt works for [employer] and [employer] requires a return to work note as well, neurologist will not give pt a note because her provider is on vacation and the covering provider will not give her a work note pt was told that the FMLA date to return to work should be enough. 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 Algis Downs. Prescribes anxiety medication which helps with her seizures she could get a return to work note from him. Pt wants to know if he can do this for her? Pt is aware of Dr. Algis Downs. Being off due to broken leg, wants a call back today from nurse "       Final Clinical Impression(s) / ED Diagnoses Final diagnoses:  Seizure (HCC)  History of psychogenic nonepileptic seizure    Rx / DC Orders ED Discharge Orders     None         Margarita Grizzle, MD 02/14/23 1349

## 2023-02-14 NOTE — ED Triage Notes (Signed)
Pt came from cafeteria after having a seizure. Pt states when she gets stressed out she has seizures. Pt is awake and alert on arrival to the emergency department. Pt states she doesn't remember if she was sitting or standing, if she hit her head, or if she fell on the floor. Pt states she remembers eating before the seizure.

## 2023-02-25 ENCOUNTER — Encounter: Payer: Self-pay | Admitting: Obstetrics and Gynecology

## 2023-02-25 ENCOUNTER — Other Ambulatory Visit: Payer: Medicaid Other | Admitting: Obstetrics and Gynecology

## 2023-02-25 NOTE — Patient Outreach (Signed)
Medicaid Managed Care   Nurse Care Manager Note  02/25/2023 Name:  Jaime Flores MRN:  [id number] DOB:  [birth year]  Jaime Flores is an 37 y.o. year old female who is a primary patient of [name], Elige Radon, MD.  The Medicaid Managed Care Coordination team was consulted for assistance with:    Chronic healthcare management needs, tobacco use, anxiety/depreesion,  PNES  Jaime Flores was given information about Medicaid Managed Care Coordination team services today. Jaime Flores Patient agreed to services and verbal consent obtained.  Engaged with patient by telephone for follow up visit in response to provider referral for case management and/or care coordination services.   Assessments/Interventions:  Review of past medical history, allergies, medications, health status, including review of consultants reports, laboratory and other test data, was performed as part of comprehensive evaluation and provision of chronic care management services.  SDOH (Social Determinants of Health) assessments and interventions performed: SDOH Interventions    Flowsheet Row Patient Outreach Telephone from 02/25/2023 in [place] [facility] Patient Outreach Telephone from 01/25/2023 in [place] [facility]PART[facility] Patient Outreach Telephone from 11/27/2022 in [place] POP[facility]ATION [facility] DEPARTMENT Patient Outreach Telephone from 10/19/2022 in [place] POPULATION HEALTH DEPARTMENT Office Visit from 09/26/2022 in [facility] Patient Outreach Telephone from 09/24/2022 in [place] POPULATION HEALTH DEPARTMENT  SDOH Interventions        Food Insecurity Interventions -- Intervention Not Indicated Intervention Not Indicated -- -- --  Transportation Interventions -- -- Intervention Not Indicated -- -- --  Alcohol Usage Interventions Intervention Not Indicated (Score <7) -- -- -- -- --  Depression Interventions/Treatment  -- -- -- -- Medication --   Financial Strain Interventions -- -- -- Intervention Not Indicated -- --  Physical Activity Interventions -- -- -- -- -- Intervention Not Indicated  Social Connections Interventions -- -- -- Intervention Not Indicated -- --  Health Literacy Interventions Intervention Not Indicated -- -- -- -- --     Care Plan  Allergies  Allergen Reactions   Catfish [Fish Allergy] Shortness Of Breath   Doxycycline Shortness Of Breath   Wellbutrin [Bupropion] Shortness Of Breath, Nausea Only and Palpitations   Tape Swelling    Plastic tape please use paper   Medications Reviewed Today     Reviewed by [name], RN (Registered Nurse) on 02/25/23 at 1621  Med List Status: <None>   Medication Order Taking? Sig Documenting Provider Last Dose Status Informant  cyclobenzaprine (FLEXERIL) 10 MG tablet [id number] No Take 1 tablet (10 mg total) by mouth 3 (three) times daily as needed for muscle spasms. [name], Elige Radon, MD Taking Active   fluticasone [facility]) 50 MCG/ACT nasal spray [id number] No Place 2 sprays into both nostrils daily. [name], Elige Radon, MD Taking Active   loperamide (IMODIUM) 2 MG capsule [id number] No Take 1 capsule (2 mg total) by mouth as needed for diarrhea or loose stools. [name], Elige Radon, MD Taking Active   sertraline (ZOLOFT) 100 MG tablet [id number] No Take 1.5 tablets (150 mg total) by mouth at bedtime. [name], Elige Radon, MD Taking Active   traZODone (DESYREL) 100 MG tablet [id number] No Take 1-1.5 tablets (100-150 mg total) by mouth as needed for sleep. [name], Elige Radon, MD Taking Active            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   Prediabetes 02/15/2020   Anxiety  11/18/2019   Depression, recurrent (HCC) 11/18/2019   Seasonal and perennial allergic rhinitis 05/15/2019   Amenorrhea 01/27/2013   Conditions to be addressed/monitored per PCP order:  Chronic healthcare management needs,  tobacco use, anxiety/depreesion,  PNES  Care Plan : RN Care Manager Plan of Care  Updates made by [name], RN since 02/25/2023 12:00 AM     Problem: Health Promotion or Disease Self-Management (General Plan of Care)      Long-Range Goal: Chronic Disease Management   Start Date: 08/23/2022  Expected End Date: 04/27/2023  Priority: High  Note:   Current Barriers:  Knowledge Deficits related to plan of care for management of Anxiety, Depression, allergic rhinitis, and PNES, tobacco use  Chronic Disease Management support and education needs related to Anxiety, Depression, Allergic Rhinitis, and PNES, tobacco use  02/25/23-decreased seizure activity per patient-seen in ED 7/25 for seizure.  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  Patient states she needs CBT and having trouble finding therapist-LCSW referral placed.  Patient with increased stress and anxiety-smoking unchanged  RNCM Clinical Goal(s):  Patient will verbalize understanding of plan for management of Anxiety, Depression, Allergic Rhinitis, and PNES, tobacco use  as evidenced by patient report verbalize basic understanding of  Anxiety, Depression, Allergic Rhinitis, and PNES, tobacco use  disease process and self health management plan as evidenced by patient report take all medications exactly as prescribed and will call provider for medication related questions as evidenced by patient report demonstrate understanding of rationale for each prescribed medication as evidenced by patient report attend all scheduled medical appointments as evidenced by patient report and EMR review demonstrate Ongoing adherence to prescribed treatment plan for Anxiety, Depression, Allergic Rhinitis, and PNES, tobacco use as evidenced by patient report and EMR review continue to work with RN Care Manager to address care management and care coordination needs related to  Anxiety, Depression, Allergic Rhinitis, and  PNES as evidenced by adherence to CM Team Scheduled appointments through collaboration with RN Care manager, provider, and care team.   Interventions: Inter-disciplinary care team collaboration (see longitudinal plan of care) Evaluation of current treatment plan related to  self management and patient's adherence to plan as established by provider Collaborated with LCSW LCSW referral for CBT  Smoking Cessation Interventions:  (Status:  New goal.) Long Term Goal Reviewed smoking history:  ; currently smoking 1/2 ppd On a scale of 1-10, reports MOTIVATION to quit is 1 On a scale of 1-10, reports CONFIDENCE in quitting is 1 09/24/22:  Patient is not interested in stopping right now.  Evaluation of current treatment plan reviewed Advised patient to discuss smoking cessation options with provider Provided contact information for [place] Quit Line (1-800-QUIT-NOW) Discussed plans with patient for ongoing care management follow up and provided patient with direct contact information for care management team Assessed social determinant of health barriers     (Status:  New goal.)  Long Term Goal Evaluation of current treatment plan related to Anxiety, Depression, Allergic Rhinitis, and PNES , self-management and patient's adherence to plan as established by provider. Discussed plans with patient for ongoing care management follow up and provided patient with direct contact information for care management team Evaluation of current treatment plan related to anxiety, depression, rhinitis, PNES  and patient's adherence to plan as established by provider Reviewed medications with patient Reviewed scheduled/upcoming provider appointments  Discussed plans with patient for ongoing care management follow up and provided patient with direct  contact information for care management team Assessed social determinant of health barriers  Patient Goals/Self-Care Activities: Take all medications as prescribed Attend  all scheduled provider appointments Call pharmacy for medication refills 3-7 days in advance of running out of medications Perform all self care activities independently  Perform IADL's (shopping, preparing meals, housekeeping, managing finances) independently Call provider office for new concerns or questions   Follow Up Plan:  The patient has been provided with contact information for the care management team and has been advised to call with any health related questions or concerns.  The care management team will reach out to the patient again over the next 30 business  days.    Long-Range Goal: Establish Plan of Care for Chronic Disease Management Needs   Priority: High  Note:   Timeframe:  Long-Range Goal Priority:  High Start Date: 08/23/22                            Expected End Date: ongoing                      Follow Up Date 03/28/23   - practice safe sex - schedule appointment for flu shot - schedule appointment for vaccines needed due to my age or health - schedule recommended health tests (blood work, mammogram, colonoscopy, pap test) - schedule and keep appointment for annual check-up    Why is this important?   Screening tests can find diseases early when they are easier to treat.  Your doctor or nurse will talk with you about which tests are important for you.  Getting shots for common diseases like the flu and shingles will help prevent them.  02/25/23-patient to have PCP appt Friday   Follow Up:  Patient agrees to Care Plan and Follow-up.  Plan: The Managed Medicaid care management team will reach out to the patient again over the next 30 business  days. and The  Patient has been provided with contact information for the Managed Medicaid care management team and has been advised to call with any health related questions or concerns.  Date/time of next scheduled RN care management/care coordination outreach: 03/28/23 at 1030

## 2023-02-25 NOTE — Patient Instructions (Signed)
Hi Jaime Flores for updating me-I hope your appointment goes well Friday.  Jaime Flores was given information about Medicaid Managed Care team care coordination services as a part of their Healthy [facility] Medicaid benefit. Jaime Flores verbally consented to engagement with the [facility] Managed Care team.   If you are experiencing a medical emergency, please call 911 or report to your local emergency department or urgent care.   If you have a non-emergency medical problem during routine business hours, please contact your provider's office and ask to speak with a nurse.   For questions related to your Healthy [facility] health plan, please call: [phone] or visit the homepage here: [email or web address]  If you would like to schedule transportation through your Healthy [facility] plan, please call the following number at least 2 days in advance of your appointment: [phone]  For information about your ride after you set it up, call Ride Assist at [phone]. Use this number to activate a Will Call pickup, or if your transportation is late for a scheduled pickup. Use this number, too, if you need to make a change or cancel a previously scheduled reservation.  If you need transportation services right away, call [phone]. The after-hours call center is staffed 24 hours to handle ride assistance and urgent reservation requests (including discharges) 365 days a year. Urgent trips include sick visits, hospital discharge requests and life-sustaining treatment.  Call the [facility] Line at [phone], at any time, 24 hours a day, 7 days a week. If you are in danger or need immediate medical attention call 911.  If you would like help to quit smoking, call 1-800-QUIT-NOW (313-855-8946) OR Espaol: 1-855-Djelo-Ya (4-742-595-6387) o para ms informacin haga clic aqu or Text READY to 564-332 to register via text  Jaime Flores  - following are the goals we discussed in your visit today:   Goals Addressed    Timeframe:  Long-Range Goal Priority:  High Start Date: 08/23/22                            Expected End Date: ongoing                      Follow Up Date 03/28/23   - practice safe sex - schedule appointment for flu shot - schedule appointment for vaccines needed due to my age or health - schedule recommended health tests (blood work, mammogram, colonoscopy, pap test) - schedule and keep appointment for annual check-up    Why is this important?   Screening tests can find diseases early when they are easier to treat.  Your doctor or nurse will talk with you about which tests are important for you.  Getting shots for common diseases like the flu and shingles will help prevent them.  02/25/23-patient to have PCP appt Friday  Patient verbalizes understanding of instructions and care plan provided today and agrees to view in MyChart. Active MyChart status and patient understanding of how to access instructions and care plan via MyChart confirmed with patient.     The Managed Medicaid care management team will reach out to the patient again over the next 30 business  days.  The  Patient  has been provided with contact information for the Managed Medicaid care management team and has been advised to call with any health related questions or concerns.   [name] RN, BSN [place]  [employer], production -  Managed Medicaid High Risk [phone]   Following is a copy of your plan of care:  Care Plan : RN Care Manager Plan of Care  Updates made by [name], RN since 02/25/2023 12:00 AM     Problem: Health Promotion or Disease Self-Management (General Plan of Care)      Long-Range Goal: Chronic Disease Management   Start Date: 08/23/2022  Expected End Date: 04/27/2023  Priority: High  Note:   Current Barriers:  Knowledge Deficits related to plan of care for management of  Anxiety, Depression, allergic rhinitis, and PNES, tobacco use  Chronic Disease Management support and education needs related to Anxiety, Depression, Allergic Rhinitis, and PNES, tobacco use  02/25/23-decreased seizure activity per patient-seen in ED 7/25 for seizure.  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  Patient states she needs CBT and having trouble finding therapist-LCSW referral placed.  Patient with increased stress and anxiety-smoking unchanged  RNCM Clinical Goal(s):  Patient will verbalize understanding of plan for management of Anxiety, Depression, Allergic Rhinitis, and PNES, tobacco use  as evidenced by patient report verbalize basic understanding of  Anxiety, Depression, Allergic Rhinitis, and PNES, tobacco use  disease process and self health management plan as evidenced by patient report take all medications exactly as prescribed and will call provider for medication related questions as evidenced by patient report demonstrate understanding of rationale for each prescribed medication as evidenced by patient report attend all scheduled medical appointments as evidenced by patient report and EMR review demonstrate Ongoing adherence to prescribed treatment plan for Anxiety, Depression, Allergic Rhinitis, and PNES, tobacco use as evidenced by patient report and EMR review continue to work with RN Care Manager to address care management and care coordination needs related to  Anxiety, Depression, Allergic Rhinitis, and PNES as evidenced by adherence to CM Team Scheduled appointments through collaboration with RN Care manager, provider, and care team.   Interventions: Inter-disciplinary care team collaboration (see longitudinal plan of care) Evaluation of current treatment plan related to  self management and patient's adherence to plan as established by provider Collaborated with LCSW LCSW referral for CBT  Smoking Cessation Interventions:  (Status:   New goal.) Long Term Goal Reviewed smoking history:  ; currently smoking 1/2 ppd On a scale of 1-10, reports MOTIVATION to quit is 1 On a scale of 1-10, reports CONFIDENCE in quitting is 1 09/24/22:  Patient is not interested in stopping right now.  Evaluation of current treatment plan reviewed Advised patient to discuss smoking cessation options with provider Provided contact information for [place] [facility]uit Line (1-800-QUIT-NOW) Discussed plans with patient for ongoing care management follow up and provided patient with direct contact information for care management team Assessed social determinant of health barriers     (Status:  New goal.)  Long Term Goal Evaluation of current treatment plan related to Anxiety, Depression, Allergic Rhinitis, and PNES , self-management and patient's adherence to plan as established by provider. Discussed plans with patient for ongoing care management follow up and provided patient with direct contact information for care management team Evaluation of current treatment plan related to anxiety, depression, rhinitis, PNES  and patient's adherence to plan as established by provider Reviewed medications with patient Reviewed scheduled/upcoming provider appointments  Discussed plans with patient for ongoing care management follow up and provided patient with direct contact information for care management team Assessed social determinant of health barriers  Patient Goals/Self-Care Activities: Take all medications as prescribed Attend  all scheduled provider appointments Call pharmacy for medication refills 3-7 days in advance of running out of medications Perform all self care activities independently  Perform IADL's (shopping, preparing meals, housekeeping, managing finances) independently Call provider office for new concerns or questions   Follow Up Plan:  The patient has been provided with contact information for the care management team and has been advised to  call with any health related questions or concerns.  The care management team will reach out to the patient again over the next 30 business  days.

## 2023-03-01 ENCOUNTER — Encounter: Payer: Self-pay | Admitting: Family Medicine

## 2023-03-01 ENCOUNTER — Other Ambulatory Visit: Payer: Medicaid Other | Admitting: Licensed Clinical Social Worker

## 2023-03-01 ENCOUNTER — Telehealth (INDEPENDENT_AMBULATORY_CARE_PROVIDER_SITE_OTHER): Payer: Medicaid Other | Admitting: Family Medicine

## 2023-03-01 DIAGNOSIS — F445 Conversion disorder with seizures or convulsions: Secondary | ICD-10-CM

## 2023-03-01 DIAGNOSIS — F419 Anxiety disorder, unspecified: Secondary | ICD-10-CM | POA: Diagnosis not present

## 2023-03-01 DIAGNOSIS — R569 Unspecified convulsions: Secondary | ICD-10-CM

## 2023-03-01 MED ORDER — CYCLOBENZAPRINE HCL 10 MG PO TABS: 10.0000 mg | ORAL_TABLET | Freq: Three times a day (TID) | ORAL | 1 refills | Status: DC | PRN

## 2023-03-01 MED ORDER — QUETIAPINE FUMARATE ER 150 MG PO TB24: 150.0000 mg | ORAL_TABLET | Freq: Every day | ORAL | 2 refills | Status: DC

## 2023-03-01 NOTE — Patient Instructions (Signed)
Visit Information  Jaime Flores was given information about Medicaid Managed Care team care coordination services as a part of their [facility] Medicaid benefit. Jaime Flores verbally consented to engagement with the [facility] Managed Care team.   If you are experiencing a medical emergency, please call 911 or report to your local emergency department or urgent care.   If you have a non-emergency medical problem during routine business hours, please contact your provider's office and ask to speak with a nurse.   For questions related to your Healthy [facility] health plan, please call: [phone] or visit the homepage here: [email or web address]  If you would like to schedule transportation through your Healthy [facility] plan, please call the following number at least 2 days in advance of your appointment: [phone]  For information about your ride after you set it up, call Ride Assist at [phone]. Use this number to activate a Will Call pickup, or if your transportation is late for a scheduled pickup. Use this number, too, if you need to make a change or cancel a previously scheduled reservation.  If you need transportation services right away, call [phone]. The after-hours call center is staffed 24 hours to handle ride assistance and urgent reservation requests (including discharges) 365 days a year. Urgent trips include sick visits, hospital discharge requests and life-sustaining treatment.  Call the [facility] Line at [phone], at any time, 24 hours a day, 7 days a week. If you are in danger or need immediate medical attention call 911.  If you would like help to quit smoking, call 1-800-QUIT-NOW ((502)432-7711) OR Espaol: 1-855-Djelo-Ya (2-376-283-1517) o para ms informacin haga clic aqu or Text READY to 616-073 to register via text  Following is a copy of your plan of care:  Care Plan : LCSW Plan of Care   Updates made by Jaime Bryant, LCSW since 03/01/2023 12:00 AM     Problem: Depression Identification (Depression)      Goal: Depressive Symptoms Identified   Note:   Priority: High  Timeframe:  Short-Range Goal Priority:  High Start Date:   03/01/23          Expected End Date:  ongoing                     Follow Up Date --03/12/23 at 11 am  Current Barriers:  Knowledge Deficits related to plan of care for management of Anxiety, Depression, and 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 Caregiver strain- mother of autistic child  02/25/23-decreased seizure activity per patient-seen in ED 7/25 for seizure.  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  Patient states she needs CBT and having trouble finding therapist. Patient with increased stress and anxiety-smoking unchanged  LCSW Clinical Goal(s):  Patient will verbalize understanding of plan for management of Anxiety, Depression, and Caregiver Strain as evidenced by patient report verbalize basic understanding of  Anxiety, Depression and self health management plan as evidenced by patient report take all medications exactly as prescribed and will call provider for medication related questions as evidenced by patient report demonstrate understanding of rationale for each prescribed medication as evidenced by patient report attend all scheduled medical appointments as evidenced by patient report and EMR review demonstrate Ongoing adherence to prescribed treatment plan for Anxiety, Depression, Allergic Rhinitis, and PNES, tobacco use as evidenced by patient report and EMR review continue to work with LCSW to address care management and care  coordination needs related to  Anxiety and Depression as evidenced by adherence to CM Team Scheduled appointments through collaboration with RN Care manager, provider, and care team.   Patient Goals/Self-Care Activities: Take all medications as  prescribed Attend all scheduled provider appointments Call pharmacy for medication refills 3-7 days in advance of running out of medications Perform all self care activities independently  Perform IADL's (shopping, preparing meals, housekeeping, managing finances) independently Call provider office for new concerns or questions   Follow Up Plan:  The patient has been provided with contact information for the care management team and has been advised to call with any health related questions or concerns.  The care management team will reach out to the patient again over the next 30 business  days.     The following coping skill education was provided for stress relief and mental health management: "When your car dies or a deadline looms, how do you respond? Long-term, low-grade or acute stress takes a serious toll on your body and mind, so don't ignore feelings of constant tension. Stress is a natural part of life. However, too much stress can harm our health, especially if it continues every day. This is chronic stress and can put you at risk for heart problems like heart disease and depression. Understand what's happening inside your body and learn simple coping skills to combat the negative impacts of everyday stressors.  Types of Stress There are two types of stress: Emotional - types of emotional stress are relationship problems, pressure at work, financial worries, experiencing discrimination or having a major life change. Physical - Examples of physical stress include being sick having pain, not sleeping well, recovery from an injury or having an alcohol and drug use disorder. Fight or Flight Sudden or ongoing stress activates your nervous system and floods your bloodstream with adrenaline and cortisol, two hormones that raise blood pressure, increase heart rate and spike blood sugar. These changes pitch your body into a fight or flight response. That enabled our ancestors to outrun  saber-toothed tigers, and it's helpful today for situations like dodging a car accident. But most modern chronic stressors, such as finances or a challenging relationship, keep your body in that heightened state, which hurts your health. Effects of Too Much Stress If constantly under stress, most of [place] will eventually start to function less well.  Multiple studies link chronic stress to a higher risk of heart disease, stroke, depression, weight gain, memory loss and even premature death, so it's important to recognize the warning signals. Talk to your doctor about ways to manage stress if you're experiencing any of these symptoms: Prolonged periods of poor sleep. Regular, severe headaches. Unexplained weight loss or gain. Feelings of isolation, withdrawal or worthlessness. Constant anger and irritability. Loss of interest in activities. Constant worrying or obsessive thinking. Excessive alcohol or drug use. Inability to concentrate.  10 Ways to Cope with Chronic Stress It's key to recognize stressful situations as they occur because it allows you to focus on managing how you react. We all need to know when to close our eyes and take a deep breath when we feel tension rising. Use these tips to prevent or reduce chronic stress. 1. Rebalance Work and Home All work and no play? If you're spending too much time at the office, intentionally put more dates in your calendar to enjoy time for fun, either alone or with others. 2. Get Regular Exercise Moving your body on a regular basis balances the nervous system  and increases blood circulation, helping to flush out stress hormones. Even a daily 20-minute walk makes a difference. Any kind of exercise can lower stress and improve your mood ? just pick activities that you enjoy and make it a regular habit. 3. Eat Well and Limit Alcohol and Stimulants Alcohol, nicotine and caffeine may temporarily relieve stress but have negative health impacts and can make  stress worse in the long run. Well-nourished bodies cope better, so start with a good breakfast, add more organic fruits and vegetables for a well-balanced diet, avoid processed foods and sugar, try herbal tea and drink more water. 4. Connect with Supportive People Talking face to face with another person releases hormones that reduce stress. Lean on those good listeners in your life. 5. Carve Out Hobby Time Do you enjoy gardening, reading, listening to music or some other creative pursuit? Engage in activities that bring you pleasure and joy; research shows that reduces stress by almost half and lowers your heart rate, too. 6. Practice Meditation, Stress Reduction or Yoga Relaxation techniques activate a state of restfulness that counterbalances your body's fight-or-flight hormones. Even if this also means a 10-minute break in a long day: listen to music, read, go for a walk in nature, do a hobby, take a bath or spend time with a friend. Also consider doing a mindfulness exercise or try a daily deep breathing or imagery practice. Deep Breathing Slow, calm and deep breathing can help you relax. Try these steps to focus on your breathing and repeat as needed. Find a comfortable position and close your eyes. Exhale and drop your shoulders. Breathe in through your nose; fill your lungs and then your belly. Think of relaxing your body, quieting your mind and becoming calm and peaceful. Breathe out slowly through your nose, relaxing your belly. Think of releasing tension, pain, worries or distress. Repeat steps three and four until you feel relaxed. Imagery This involves using your mind to excite the senses -- sound, vision, smell, taste and feeling. This may help ease your stress. Begin by getting comfortable and then do some slow breathing. Imagine a place you love being at. It could be somewhere from your childhood, somewhere you vacationed or just a place in your imagination. Feel how it is to be in  the place you're imagining. Pay attention to the sounds, air, colors, and who is there with you. This is a place where you feel cared for and loved. All is well. You are safe. Take in all the smells, sounds, tastes and feelings. As you do, feel your body being nourished and healed. Feel the calm that surrounds you. Breathe in all the good. Breathe out any discomfort or tension. 7. Sleep Enough If you get less than seven to eight hours of sleep, your body won't tolerate stress as well as it could. If stress keeps you up at night, address the cause, and add extra meditation into your day to make up for the lost z's. Try to get seven to nine hours of sleep each night. Make a regular bedtime schedule. Keep your room dark and cool. Try to avoid computers, TV, cell phones and tablets before bed. 8. Bond with Connections You Enjoy Go out for a coffee with a friend, chat with a neighbor, call a family member, visit with a clergy member, or even hang out with your pet. Clinical studies show that spending even a short time with a companion animal can cut anxiety levels almost in half. 9. Take a  Vacation Getting away from it all can reset your stress tolerance by increasing your mental and emotional outlook, which makes you a happier, more productive person upon return. Leave your cellphone and laptop at home! 10. See a Counselor, Coach or Therapist If negative thoughts overwhelm your ability to make positive changes, it's time to seek professional help. Make an appointment today--your health and life are worth it."   10 LITTLE Things To Do When You're Feeling Too Down To Do Anything  Take a shower. Even if you plan to stay in all day long and not see a soul, take a shower. It takes the most effort to hop in to the shower but once you do, you'll feel immediate results. It will wake you up and you'll be feeling much fresher (and cleaner too).  Brush and floss your teeth. Give your teeth a good brushing with  a floss finish. It's a small task but it feels so good and you can check 'taking care of your health' off the list of things to do.  Do something small on your list. Most of [place] have some small thing we would like to get done (load of laundry, sew a button, email a friend). Doing one of these things will make you feel like you've accomplished something.  Drink water. Drinking water is easy right? It's also really beneficial for your health so keep a glass beside you all day and take sips often. It gives you energy and prevents you from boredom eating.  Do some floor exercises. The last thing you want to do is exercise but it might be just the thing you need the most. Keep it simple and do exercises that involve sitting or laying on the floor. Even the smallest of exercises release chemicals in the brain that make you feel good. Yoga stretches or core exercises are going to make you feel good with minimal effort.  Make your bed. Making your bed takes a few minutes but it's productive and you'll feel relieved when it's done. An unmade bed is a huge visual reminder that you're having an unproductive day. Do it and consider it your housework for the day.  Put on some nice clothes. Take the sweatpants off even if you don't plan to go anywhere. Put on clothes that make you feel good. Take a look in the mirror so your brain recognizes the sweatpants have been replaced with clothes that make you look great. It's an instant confidence booster.  Wash the dishes. A pile of dirty dishes in the sink is a reflection of your mood. It's possible that if you wash up the dishes, your mood will follow suit. It's worth a try.  Cook a real meal. If you have the luxury to have a "do nothing" day, you have time to make a real meal for yourself. Make a meal that you love to eat. The process is good to get you out of the funk and the food will ensure you have more energy for tomorrow.  Write out your thoughts by  hand. When you hand write, you stimulate your brain to focus on the moment that you're in so make yourself comfortable and write whatever comes into your mind. Put those thoughts out on paper so they stop spinning around in your head. Those thoughts might be the very thing holding you down.     24- Hour Availability:    [facility]  [address] [place], [place] [employer] [phone]  Crisis [phone]   [facility] [phone]  [facility] Crisis Service  [phone]    [facility] Crisis Services  [phone] (after hours)   Therapeutic Alternative/Mobile Crisis   [phone]   [place] National Suicide Hotline  419-734-5615 Len Childs) [place] 016   Call [phone] for mental health emergencies   [facility]  [phone]);  [place] and [employer]  [phone]); [place], [place], [place], [place], [place], [place], [place]    [place] [facility] for [facility] Residents For 24/7 walk-up access to mental health services for [facility] children (4+), adolescents and adults, please visit the [facility] located at [address] in [place], [place].  *[facility] also provides comprehensive outpatient behavioral health services in a variety of locations around the [place].  Connect With [place] [address] [place], [place] [address] HelpLine: [phone] or [phone]  Get Directions  Find Help 24/7 By Phone Call our 24-hour HelpLine at [phone] or [phone] for immediate assistance for mental health and substance abuse issues.  Walk-In Help [place]: [facility] (Ages 4 and Up) [place]: Emergency Dept., [facility] Additional Resources National Hopeline Network: 1-800-SUICIDE The National Suicide Prevention Lifeline: 1-800-273-TALK     Dickie La, BSW, MSW,  LCSW Managed Medicaid LCSW [facility]  [email or web address] Phone: [phone]

## 2023-03-01 NOTE — Patient Outreach (Signed)
Medicaid Managed Care Social Work Note  03/01/2023 Name:  EMILA VEITH MRN:  [id number] DOB:  [birth year]  CELSIE STALLARD is an 37 y.o. year old female who is a primary patient of [name], Elige Radon, MD.  The Medicaid Managed Care Coordination team was consulted for assistance with:  Mental Health Counseling and Resources  Ms. Dein was given information about Medicaid Managed Care Coordination team services today. [name] Patient agreed to services and verbal consent obtained.  Engaged with patient  for by telephone forinitial visit in response to referral for case management and/or care coordination services.   Assessments/Interventions:  Review of past medical history, allergies, medications, health status, including review of consultants reports, laboratory and other test data, was performed as part of comprehensive evaluation and provision of chronic care management services.  SDOH: (Social Determinant of Health) assessments and interventions performed: SDOH Interventions    Flowsheet Row Patient Outreach Telephone from 03/01/2023 in [place] POPULATION HEALTH DEPARTMENT Patient Outreach Telephone from 02/25/2023 in [place] POPULATION HEALTH DEPARTMENT Patient Outreach Telephone from 01/25/2023 in [place] POPULATION HEALTH DEPARTMENT Patient Outreach Telephone from 11/27/2022 in [place] POPULATION HEALTH DEPARTMENT Patient Outreach Telephone from 10/19/2022 in  POPULATION HEALTH DEPARTMENT Office Visit from 09/26/2022 in [facility]  SDOH Interventions        Food Insecurity Interventions -- -- Intervention Not Indicated Intervention Not Indicated -- --  Transportation Interventions -- -- -- Intervention Not Indicated -- --  Alcohol Usage Interventions -- Intervention Not Indicated (Score <7) -- -- -- --  Depression Interventions/Treatment  -- -- -- -- -- Medication  Financial Strain Interventions -- -- -- --  Intervention Not Indicated --  Stress Interventions Offered [employer], Provide Counseling  [Needs therapy] -- -- -- -- --  Social Connections Interventions -- -- -- -- Intervention Not Indicated --  Health Literacy Interventions -- Intervention Not Indicated -- -- -- --       Advanced Directives Status:  See Care Plan for related entries.  Care Plan                 Allergies  Allergen Reactions   Catfish [Fish Allergy] Shortness Of Breath   Doxycycline Shortness Of Breath   Wellbutrin [Bupropion] Shortness Of Breath, Nausea Only and Palpitations   Tape Swelling    Plastic tape please use paper    Medications Reviewed Today   Medications were not reviewed in this encounter     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   Prediabetes 02/15/2020   Anxiety 11/18/2019   Depression, recurrent (HCC) 11/18/2019   Seasonal and perennial allergic rhinitis 05/15/2019   Amenorrhea 01/27/2013    Conditions to be addressed/monitored per PCP order:  Anxiety and Depression  Care Plan : LCSW Plan of Care  Updates made by Gustavus Bryant, LCSW since 03/01/2023 12:00 AM     Problem: Depression Identification (Depression)      Goal: Depressive Symptoms Identified   Note:   Priority: High  Timeframe:  Short-Range Goal Priority:  High Start Date:   03/01/23          Expected End Date:  ongoing                     Follow Up Date --03/12/23 at 11 am  Current Barriers:  Knowledge Deficits related to plan of care for  management of Anxiety, Depression, and 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 Caregiver strain- mother of autistic child  02/25/23-decreased seizure activity per patient-seen in ED 7/25 for seizure.  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  Patient states she needs CBT and having trouble finding therapist. Patient with increased stress and  anxiety-smoking unchanged  LCSW Clinical Goal(s):  Patient will verbalize understanding of plan for management of Anxiety, Depression, and Caregiver Strain as evidenced by patient report verbalize basic understanding of  Anxiety, Depression and self health management plan as evidenced by patient report take all medications exactly as prescribed and will call provider for medication related questions as evidenced by patient report demonstrate understanding of rationale for each prescribed medication as evidenced by patient report attend all scheduled medical appointments as evidenced by patient report and EMR review demonstrate Ongoing adherence to prescribed treatment plan for Anxiety, Depression, Allergic Rhinitis, and PNES, tobacco use as evidenced by patient report and EMR review continue to work with LCSW to address care management and care coordination needs related to  Anxiety and Depression as evidenced by adherence to CM Team Scheduled appointments through collaboration with RN Care manager, provider, and care team.   Interventions: Inter-disciplinary care team collaboration (see longitudinal plan of care) Evaluation of current treatment plan related to  self management and patient's adherence to plan as established by provider Advised patient to exercise 10-20 minutes everyday, go outside in the sunlight everyday for at least 10-15 minutes, drink 6-8 glasses of water a day, take vitamins/medications as directed and try to increase sleep.  Discussed plans with patient for ongoing care management follow up and provided patient with direct contact information for care management team Assessed social determinant of health barriers  Discussed plans with patient for ongoing care management follow up and provided patient with direct contact information for care management team Evaluation of current treatment plan related to anxiety, depression, rhinitis, PNES  and patient's adherence to plan as  established by provider Reviewed medications with patient Reviewed scheduled/upcoming provider appointments  Discussed plans with patient for ongoing care management follow up and provided patient with direct contact information for care management team Assessed social determinant of health barriers Patient was educated on available mental health resources within their area that accept Medicaid and offer counseling and psychiatry. Patient is agreeable to referral to [facility] at [facility] for counseling. [facility] LCSW made referral on 03/01/23.  Email sent to patient today with available mental health resources within her area that accept Medicaid and offer the services that she is interested in.  Emotional support provided. CBT intervention implemented regarding "being mentally fit" by combating negative thinking and replacing it with uplifting support, hope and positivity.  Patient Goals/Self-Care Activities: Take all medications as prescribed Attend all scheduled provider appointments Call pharmacy for medication refills 3-7 days in advance of running out of medications Perform all self care activities independently  Perform IADL's (shopping, preparing meals, housekeeping, managing finances) independently Call provider office for new concerns or questions   Follow Up Plan:  The patient has been provided with contact information for the care management team and has been advised to call with any health related questions or concerns.  The care management team will reach out to the patient again over the next 30 business  days.        12/04/2022    1:39 PM 09/26/2022    2:02 PM 09/26/2022    2:01  PM 07/18/2022    9:04 AM 01/03/2022   10:43 AM  Depression screen PHQ 2/9  Decreased Interest 0  0 1 2  Down, Depressed, Hopeless 0  1 1 2   PHQ - 2 Score 0  1 2 4   Altered sleeping 2 2  3 3   Tired, decreased energy 2 2  1 3   Change in appetite 0 2  0 3  Feeling bad or failure about yourself  0 0  0 2   Trouble concentrating 0 0  0 0  Moving slowly or fidgety/restless 0 1  0 0  Suicidal thoughts 0 0  0 1  PHQ-9 Score 4   6 16   Difficult doing work/chores  Somewhat difficult  Somewhat difficult Extremely dIfficult      12/04/2022    1:39 PM 09/26/2022    2:02 PM 07/18/2022    9:04 AM 01/03/2022   10:43 AM  GAD 7 : Generalized Anxiety Score  Nervous, Anxious, on Edge 0 1 2 1   Control/stop worrying 0 0 0 2  Worry too much - different things 0 0 2 3  Trouble relaxing 1 1 2 3   Restless 0 0 0 3  Easily annoyed or irritable 0 1 1 3   Afraid - awful might happen 0 0 0 3  Total GAD 7 Score 1 3 7 18   Anxiety Difficulty  Somewhat difficult Somewhat difficult Extremely difficult       24- Hour Availability:    [facility]  [address] [place], [place] [phone] Crisis [phone]   [facility] [phone]  [place] [facility]  [phone]    [facility]  [phone] (after hours)   Therapeutic Alternative/Mobile Crisis   [phone]   [place] National Suicide Hotline  (330)049-5994 (TALK) [place] [address]   Call (662) 706-7953 for mental health emergencies   [facility]  ([phone]);  [place] and [employer]  [phone]); [place], [place], [place], [place], [place], [place], [place]    [facility] for [facility] Residents For 24/7 walk-up access to mental health services for [facility] children (4+), adolescents and adults, please visit the [facility] located at [address] in [place], [place].  *[facility] also provides comprehensive outpatient behavioral health services in a variety of locations around the [place].  Connect With [place] [address] [place], [place] [address] HelpLine: (979)005-4456 or 1-251-849-3773  Get Directions  Find Help 24/7 By Phone Call our 24-hour HelpLine at  952-009-0624 or (647)609-8678 for immediate assistance for mental health and substance abuse issues.  Walk-In Help [place]: [facility] (Ages 4 and Up) [place]: Emergency Dept., [facility] Additional Resources National Hopeline Network: 1-800-SUICIDE The National Suicide Prevention Lifeline: 1-800-273-TALK      Follow up:  Patient agrees to Care Plan and Follow-up.  Plan: The Managed Medicaid care management team will reach out to the patient again over the next 30 days.  Dickie La, BSW, MSW, [employer] Managed Medicaid LCSW [facility]  [facility] [email or web address] Phone: [phone]

## 2023-03-01 NOTE — Progress Notes (Signed)
Virtual Visit via MyChart video note  I connected with Jaime Flores on 03/01/23 at 1555 by video and verified that I am speaking with the correct person using two identifiers. Jaime Flores is currently located at home and  spouse  are currently with her during visit. The provider, Elige Radon , MD is located in their office at time of visit.  Call ended at 1612  I discussed the limitations, risks, security and privacy concerns of performing an evaluation and management service by video and the availability of in person appointments. I also discussed with the patient that there may be a patient responsible charge related to this service. The patient expressed understanding and agreed to proceed.   History and Present Illness: Seizure disorder,  Sh had a new job and was feeling happy and when her boss called to the office about schedule changes and had seizure like issue.  She is taking potassium pill for hypokalemia in the ER when she went on 7/25 and before that on 7/3. She is having some seizure issues and had to resign from job. She saw [employer] office.   She had some issues with doctors note and had another seizure on the day she went back to work. She is not sleeping at night and is having more headaches with psychogenic seizures.    Outpatient Encounter Medications as of 03/01/2023  Medication Sig   QUEtiapine Fumarate (SEROQUEL XR) 150 MG 24 hr tablet Take 1 tablet (150 mg total) by mouth at bedtime.   cyclobenzaprine (FLEXERIL) 10 MG tablet Take 1 tablet (10 mg total) by mouth 3 (three) times daily as needed for muscle spasms.   fluticasone (FLONASE) 50 MCG/ACT nasal spray Place 2 sprays into both nostrils daily.   loperamide (IMODIUM) 2 MG capsule Take 1 capsule (2 mg total) by mouth as needed for diarrhea or loose stools.   sertraline (ZOLOFT) 100 MG tablet Take 1.5 tablets (150 mg total) by mouth at bedtime.   [DISCONTINUED] traZODone (DESYREL) 100 MG tablet Take  1-1.5 tablets (100-150 mg total) by mouth as needed for sleep.   No facility-administered encounter medications on file as of 03/01/2023.    Review of Systems  Constitutional:  Negative for chills and fever.  Eyes:  Negative for visual disturbance.  Respiratory:  Negative for chest tightness and shortness of breath.   Cardiovascular:  Negative for chest pain and leg swelling.  Genitourinary:  Negative for difficulty urinating and dysuria.  Musculoskeletal:  Negative for back pain and gait problem.  Skin:  Negative for rash.  Neurological:  Positive for seizures and headaches. Negative for light-headedness.  Psychiatric/Behavioral:  Positive for dysphoric mood. Negative for agitation, behavioral problems, self-injury, sleep disturbance and suicidal ideas. The patient is nervous/anxious.   All other systems reviewed and are negative.   Observations/Objective: Patient sounds comfortable and in no acute distress  Assessment and Plan: Problem List Items Addressed This Visit       Other   Anxiety - Primary   Other Visit Diagnoses     Psychogenic nonepileptic seizure       Relevant Orders   MR Brain Wo Contrast   Seizure-like activity (HCC)       Relevant Orders   MR Brain Wo Contrast       Will switch trazodone to Seroquel and see if it helps.   She will do CBT and has referral for that.  Follow up plan: Return if symptoms worsen or fail to improve,  for 1-2 months.     I discussed the assessment and treatment plan with the patient. The patient was provided an opportunity to ask questions and all were answered. The patient agreed with the plan and demonstrated an understanding of the instructions.   The patient was advised to call back or seek an in-person evaluation if the symptoms worsen or if the condition fails to improve as anticipated.  The above assessment and management plan was discussed with the patient. The patient verbalized understanding of and has agreed to the  management plan. Patient is aware to call the clinic if symptoms persist or worsen. Patient is aware when to return to the clinic for a follow-up visit. Patient educated on when it is appropriate to go to the emergency department.    I provided 17 minutes of non-face-to-face time during this encounter.    Nils Pyle, MD

## 2023-03-05 ENCOUNTER — Telehealth: Payer: Self-pay | Admitting: Family Medicine

## 2023-03-05 ENCOUNTER — Other Ambulatory Visit: Payer: Self-pay | Admitting: Family Medicine

## 2023-03-05 ENCOUNTER — Other Ambulatory Visit: Payer: Self-pay

## 2023-03-05 DIAGNOSIS — R5383 Other fatigue: Secondary | ICD-10-CM

## 2023-03-05 NOTE — Telephone Encounter (Signed)
Orders placed.

## 2023-03-12 ENCOUNTER — Other Ambulatory Visit: Payer: Medicaid Other | Admitting: Licensed Clinical Social Worker

## 2023-03-12 NOTE — Patient Outreach (Signed)
Medicaid Managed Care Social Work Note  03/12/2023 Name:  Jaime Flores MRN:  [id number] DOB:  18-Jul-1986  SURVEEN Flores is an 37 y.o. year old female who is a primary patient of [name], Elige Radon, MD.  The Medicaid Managed Care Coordination team was consulted for assistance with:  Mental Health Counseling and Resources  Ms. Depaul was given information about Medicaid Managed Care Coordination team services today. [name] Patient agreed to services and verbal consent obtained.  Engaged with patient  for by telephone forfollow up visit in response to referral for case management and/or care coordination services.   Assessments/Interventions:  Review of past medical history, allergies, medications, health status, including review of consultants reports, laboratory and other test data, was performed as part of comprehensive evaluation and provision of chronic care management services.  SDOH: (Social Determinant of Health) assessments and interventions performed: SDOH Interventions    Flowsheet Row Patient Outreach Telephone from 03/12/2023 in [facility] POPUL[facility] HEALTH DEPARTMENT Patient Outreach Telephone from 03/01/2023 in [place] POPULATION HEALTH DEPARTMENT Patient Outreach Telephone from 02/25/2023 in [place] POPULATION HEALTH DEPARTMENT Patient Outreach Telephone from 01/25/2023 in [place] POPULATION HEALTH DEPARTMENT Patient Outreach Telephone from 11/27/2022 in [place] POPULATION HEALTH DEPARTMENT Patient Outreach Telephone from 10/19/2022 in [place] POPULATION HEALTH DEPARTMENT  SDOH Interventions        Food Insecurity Interventions -- -- -- Intervention Not Indicated Intervention Not Indicated --  Transportation Interventions -- -- -- -- Intervention Not Indicated --  Alcohol Usage Interventions -- -- Intervention Not Indicated (Score <7) -- -- --  Financial Strain Interventions -- -- -- -- -- Intervention Not Indicated  Stress Interventions Provide  Counseling, Offered [employer] Resources  [Ongoing caregiver strain, emotional support provided] [employer], Provide Counseling  [Needs therapy] -- -- -- --  Social Connections Interventions -- -- -- -- -- Intervention Not Indicated  Health Literacy Interventions -- -- Intervention Not Indicated -- -- --       Advanced Directives Status:  See Care Plan for related entries.  Care Plan                 Allergies  Allergen Reactions   Catfish [Fish Allergy] Shortness Of Breath   Doxycycline Shortness Of Breath   Wellbutrin [Bupropion] Shortness Of Breath, Nausea Only and Palpitations   Tape Swelling    Plastic tape please use paper    Medications Reviewed Today   Medications were not reviewed in this encounter     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   Prediabetes 02/15/2020   Anxiety 11/18/2019   Depression, recurrent (HCC) 11/18/2019   Seasonal and perennial allergic rhinitis 05/15/2019   Amenorrhea 01/27/2013    Conditions to be addressed/monitored per PCP order:  Depression  Care Plan : LCSW Plan of Care  Updates made by Gustavus Bryant, LCSW since 03/12/2023 12:00 AM     Problem: Depression Identification (Depression)      Goal: Depressive Symptoms Identified   Note:   Priority: High  Timeframe:  Short-Range Goal Priority:  High Start Date:   03/01/23          Expected End Date:  ongoing                     Follow Up Date 03/29/23 at 11 am  Current Barriers:  Knowledge Deficits related to plan of care  for management of Anxiety, Depression, and 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 Caregiver strain- mother of autistic child  02/25/23-decreased seizure activity per patient-seen in ED 7/25 for seizure.  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  Patient states she needs CBT and having trouble finding therapist.  Patient with increased stress and anxiety-smoking unchanged  LCSW Clinical Goal(s):  Patient will verbalize understanding of plan for management of Anxiety, Depression, and Caregiver Strain as evidenced by patient report verbalize basic understanding of  Anxiety, Depression and self health management plan as evidenced by patient report take all medications exactly as prescribed and will call provider for medication related questions as evidenced by patient report demonstrate understanding of rationale for each prescribed medication as evidenced by patient report attend all scheduled medical appointments as evidenced by patient report and EMR review demonstrate Ongoing adherence to prescribed treatment plan for Anxiety, Depression, Allergic Rhinitis, and PNES, tobacco use as evidenced by patient report and EMR review continue to work with LCSW to address care management and care coordination needs related to  Anxiety and Depression as evidenced by adherence to CM Team Scheduled appointments through collaboration with RN Care manager, provider, and care team.   Interventions: Inter-disciplinary care team collaboration (see longitudinal plan of care) Evaluation of current treatment plan related to  self management and patient's adherence to plan as established by provider Advised patient to exercise 10-20 minutes everyday, go outside in the sunlight everyday for at least 10-15 minutes, drink 6-8 glasses of water a day, take vitamins/medications as directed and try to increase sleep.  Discussed plans with patient for ongoing care management follow up and provided patient with direct contact information for care management team Assessed social determinant of health barriers  Discussed plans with patient for ongoing care management follow up and provided patient with direct contact information for care management team Evaluation of current treatment plan related to anxiety, depression, rhinitis, PNES   and patient's adherence to plan as established by provider Reviewed medications with patient Reviewed scheduled/upcoming provider appointments  Discussed plans with patient for ongoing care management follow up and provided patient with direct contact information for care management team Assessed social determinant of health barriers Patient was educated on available mental health resources within their area that accept Medicaid and offer counseling and psychiatry. Patient is agreeable to referral to [facility] for counseling. [facility] LCSW made referral on 03/01/23.  Email sent to patient today with available mental health resources within her area that accept Medicaid and offer the services that she is interested in.  Emotional support provided. CBT intervention implemented regarding "being mentally fit" by combating negative thinking and replacing it with uplifting support, hope and positivity. Update- Patient reports being overwhelmed with care taking responsibilities and shares that her spouse has even asked of her to take better care of herself as she often puts others before herself. Extensive emotional support and reflective listening provided during session. Patient was offered to contact [facility] with a joint phone call with [facility] LCSW but patient declined and stated that one of her children is sick right now so she will need to call them at a later time and day. [facility] LCSW provided brief self-care education and anxiety management coping skill reminders. [facility] LCSW will continue to provide short term therapy to patient until she is successfully established with a long term therapist. [facility] LCSW will follow up in 30 days.  Patient Goals/Self-Care Activities: Take all medications as prescribed Attend all scheduled provider appointments Call pharmacy for medication refills 3-7 days in advance of running out of medications Perform all self care activities independently  Perform  IADL's (shopping, preparing meals, housekeeping, managing finances) independently Call provider office for new concerns or questions   Follow Up Plan:  The patient has been provided with contact information for the care management team and has been advised to call with any health related questions or concerns.  The care management team will reach out to the patient again over the next 30 business  days.      Follow up:  Patient agrees to Care Plan and Follow-up.  Plan: The Managed Medicaid care management team will reach out to the patient again over the next 30 days.  Dickie La, BSW, MSW, [employer] Managed Medicaid LCSW [facility]  [facility] [email or web address] Phone: [phone]

## 2023-03-12 NOTE — Patient Instructions (Signed)
Visit Information  Jaime Flores was given information about Medicaid Managed Care team care coordination services as a part of their Healthy [facility] Medicaid benefit. Jaime Flores verbally consented to engagement with the [facility] Managed Care team.   If you are experiencing a medical emergency, please call 911 or report to your local emergency department or urgent care.   If you have a non-emergency medical problem during routine business hours, please contact your provider's office and ask to speak with a nurse.   For questions related to your Healthy [facility] health plan, please call: [phone] or visit the homepage here: [email or web address]  If you would like to schedule transportation through your Healthy [facility] plan, please call the following number at least 2 days in advance of your appointment: [phone]  For information about your ride after you set it up, call Ride Assist at [phone]. Use this number to activate a Will Call pickup, or if your transportation is late for a scheduled pickup. Use this number, too, if you need to make a change or cancel a previously scheduled reservation.  If you need transportation services right away, call [phone]. The after-hours call center is staffed 24 hours to handle ride assistance and urgent reservation requests (including discharges) 365 days a year. Urgent trips include sick visits, hospital discharge requests and life-sustaining treatment.  Call the [facility] Line at [phone], at any time, 24 hours a day, 7 days a week. If you are in danger or need immediate medical attention call 911.  If you would like help to quit smoking, call 1-800-QUIT-NOW (256 489 2140) OR Espaol: 1-855-Djelo-Ya (6-283-151-7616) o para ms informacin haga clic aqu or Text READY to 073-710 to register via text  Following is a copy of your plan of care:  Care Plan : LCSW Plan of Care   Updates made by Jaime Bryant, LCSW since 03/12/2023 12:00 AM     Problem: Depression Identification (Depression)      Goal: Depressive Symptoms Identified   Note:   Priority: High  Timeframe:  Short-Range Goal Priority:  High Start Date:   03/01/23          Expected End Date:  ongoing                     Follow Up Date 03/29/23 at 11 am  Current Barriers:  Knowledge Deficits related to plan of care for management of Anxiety, Depression, and 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 Caregiver strain- mother of autistic child  02/25/23-decreased seizure activity per patient-seen in ED 7/25 for seizure.  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  Patient states she needs CBT and having trouble finding therapist. Patient with increased stress and anxiety-smoking unchanged  LCSW Clinical Goal(s):  Patient will verbalize understanding of plan for management of Anxiety, Depression, and Caregiver Strain as evidenced by patient report verbalize basic understanding of  Anxiety, Depression and self health management plan as evidenced by patient report take all medications exactly as prescribed and will call provider for medication related questions as evidenced by patient report demonstrate understanding of rationale for each prescribed medication as evidenced by patient report attend all scheduled medical appointments as evidenced by patient report and EMR review demonstrate Ongoing adherence to prescribed treatment plan for Anxiety, Depression, Allergic Rhinitis, and PNES, tobacco use as evidenced by patient report and EMR review continue to work with LCSW to address care management and care  coordination needs related to  Anxiety and Depression as evidenced by adherence to CM Team Scheduled appointments through collaboration with RN Care manager, provider, and care team.   Patient Goals/Self-Care Activities: Take all medications as  prescribed Attend all scheduled provider appointments Call pharmacy for medication refills 3-7 days in advance of running out of medications Perform all self care activities independently  Perform IADL's (shopping, preparing meals, housekeeping, managing finances) independently Call provider office for new concerns or questions   Follow Up Plan:  The patient has been provided with contact information for the care management team and has been advised to call with any health related questions or concerns.  The care management team will reach out to the patient again over the next 30 business  days.     24- Hour Availability:    [facility]  [address] [place], [place] [phone] Crisis [phone]   [facility] [phone]  [place] [facility]  [phone]    [facility]  [phone] (after hours)   Therapeutic Alternative/Mobile Crisis   [phone]   [place] National Suicide Hotline  802-560-7452 Len Childs) [place] 884   Call 320 722 9230 for mental health emergencies   [facility]  [phone]);  [place] and [employer]  [phone]); [place], [place], [place], [place], [place], [place], [place]    [facility] for [facility] Residents For 24/7 walk-up access to mental health services for [facility] children (4+), adolescents and adults, please visit the [facility] located at [address] in [place], [place].  *[facility] also provides comprehensive outpatient behavioral health services in a variety of locations around the [place].  Connect With [place] [address] [place], [place] [address] HelpLine: 567 367 2400 or 1-913-732-7439  Get Directions  Find Help 24/7 By Phone Call our 24-hour HelpLine at 315-167-8868 or (539)879-4436 for immediate assistance for mental health and substance abuse  issues.  Walk-In Help [place]: [facility] (Ages 4 and Up) [place]: Emergency Dept., [facility] Additional Resources National Hopeline Network: 1-800-SUICIDE The National Suicide Prevention Lifeline: 1-800-273-TALK     Dickie La, BSW, MSW, LCSW Managed Medicaid LCSW [facility]  [facility] [email or web address] Phone: [phone]

## 2023-03-16 ENCOUNTER — Ambulatory Visit (HOSPITAL_COMMUNITY)
Admission: RE | Admit: 2023-03-16 | Discharge: 2023-03-16 | Disposition: A | Discharge status: Home / Self Care | Payer: Medicaid Other | Source: Ambulatory Visit | Attending: Family Medicine | Admitting: Family Medicine

## 2023-03-16 DIAGNOSIS — R569 Unspecified convulsions: Secondary | ICD-10-CM | POA: Insufficient documentation

## 2023-03-16 DIAGNOSIS — F445 Conversion disorder with seizures or convulsions: Secondary | ICD-10-CM | POA: Diagnosis not present

## 2023-03-16 DIAGNOSIS — J323 Chronic sphenoidal sinusitis: Secondary | ICD-10-CM | POA: Diagnosis not present

## 2023-03-16 DIAGNOSIS — Q048 Other specified congenital malformations of brain: Secondary | ICD-10-CM | POA: Diagnosis not present

## 2023-03-22 ENCOUNTER — Other Ambulatory Visit: Payer: Medicaid Other

## 2023-03-27 ENCOUNTER — Other Ambulatory Visit: Payer: Medicaid Other

## 2023-03-27 DIAGNOSIS — R5383 Other fatigue: Secondary | ICD-10-CM

## 2023-03-27 LAB — CMP14+EGFR
ALT: 17 IU/L (ref 0–32)
AST: 17 IU/L (ref 0–40)
Albumin: 4.4 g/dL (ref 3.9–4.9)
Alkaline Phosphatase: 113 IU/L (ref 44–121)
BUN/Creatinine Ratio: 6 — ABNORMAL LOW (ref 9–23)
BUN: 4 mg/dL — ABNORMAL LOW (ref 6–20)
Bilirubin Total: 0.2 mg/dL (ref 0.0–1.2)
CO2: 22 mmol/L (ref 20–29)
Calcium: 9.2 mg/dL (ref 8.7–10.2)
Chloride: 101 mmol/L (ref 96–106)
Creatinine, Ser: 0.71 mg/dL (ref 0.57–1.00)
Globulin, Total: 2.3 g/dL (ref 1.5–4.5)
Glucose: 118 mg/dL — ABNORMAL HIGH (ref 70–99)
Potassium: 3.5 mmol/L (ref 3.5–5.2)
Sodium: 138 mmol/L (ref 134–144)
Total Protein: 6.7 g/dL (ref 6.0–8.5)
eGFR: 114 mL/min/{1.73_m2} (ref >59)

## 2023-03-27 LAB — CBC WITH DIFFERENTIAL/PLATELET
Basophils Absolute: 0.1 10*3/uL (ref 0.0–0.2)
Basos: 1 %
EOS (ABSOLUTE): 0.1 10*3/uL (ref 0.0–0.4)
Eos: 2 %
Hematocrit: 39.7 % (ref 34.0–46.6)
Hemoglobin: 12.6 g/dL (ref 11.1–15.9)
Immature Grans (Abs): 0 10*3/uL (ref 0.0–0.1)
Immature Granulocytes: 0 %
Lymphocytes Absolute: 2.1 10*3/uL (ref 0.7–3.1)
Lymphs: 28 %
MCH: 28.5 pg (ref 26.6–33.0)
MCHC: 31.7 g/dL (ref 31.5–35.7)
MCV: 90 fL (ref 79–97)
Monocytes Absolute: 0.3 10*3/uL (ref 0.1–0.9)
Monocytes: 4 %
Neutrophils Absolute: 5.2 10*3/uL (ref 1.4–7.0)
Neutrophils: 65 %
Platelets: 344 10*3/uL (ref 150–450)
RBC: 4.42 x10E6/uL (ref 3.77–5.28)
RDW: 14.1 % (ref 11.7–15.4)
WBC: 7.8 10*3/uL (ref 3.4–10.8)

## 2023-03-27 LAB — LIPID PANEL
Chol/HDL Ratio: 6.3 ratio — ABNORMAL HIGH (ref 0.0–4.4)
Cholesterol, Total: 200 mg/dL — ABNORMAL HIGH (ref 100–199)
HDL: 32 mg/dL — ABNORMAL LOW (ref >39)
LDL Chol Calc (NIH): 151 mg/dL — ABNORMAL HIGH (ref 0–99)
Triglycerides: 90 mg/dL (ref 0–149)
VLDL Cholesterol Cal: 17 mg/dL (ref 5–40)

## 2023-03-28 ENCOUNTER — Other Ambulatory Visit: Payer: Medicaid Other | Admitting: Obstetrics and Gynecology

## 2023-03-28 ENCOUNTER — Encounter: Payer: Self-pay | Admitting: Emergency Medicine

## 2023-03-28 ENCOUNTER — Encounter: Payer: Self-pay | Admitting: Obstetrics and Gynecology

## 2023-03-28 ENCOUNTER — Ambulatory Visit
Admission: EM | Admit: 2023-03-28 | Discharge: 2023-03-28 | Disposition: A | Discharge status: Home / Self Care | Payer: Medicaid Other | Attending: Nurse Practitioner | Admitting: Nurse Practitioner

## 2023-03-28 DIAGNOSIS — Z20822 Contact with and (suspected) exposure to covid-19: Secondary | ICD-10-CM | POA: Insufficient documentation

## 2023-03-28 DIAGNOSIS — B349 Viral infection, unspecified: Secondary | ICD-10-CM | POA: Insufficient documentation

## 2023-03-28 NOTE — Discharge Instructions (Addendum)
COVID test is pending.  You will be contacted if the pending test result is positive, you will also have access to your results via MyChart. Continue over-the-counter ibuprofen or Tylenol as needed for pain, fever, or general discomfort. Increase fluids and allow for plenty of rest. If you develop fever, you should remain home until you have been fever free for at least 24 hours with no medication. Follow-up as needed.

## 2023-03-28 NOTE — Patient Outreach (Signed)
Medicaid Managed Care   Nurse Care Manager Note  03/28/2023 Name:  Jaime Flores MRN:  [id number] DOB:  1985/10/04  Jaime Flores is an 37 y.o. year old female who is a primary patient of [name], Elige Radon, MD.  The Medicaid Managed Care Coordination team was consulted for assistance with:    Chronic healthcare management needs, tobacco use, anxiety/depression, PNES  [name] was given information about Medicaid Managed Care Coordination team services today. [name] Patient agreed to services and verbal consent obtained.  Engaged with patient by telephone for follow up visit in response to provider referral for case management and/or care coordination services.   Assessments/Interventions:  Review of past medical history, allergies, medications, health status, including review of consultants reports, laboratory and other test data, was performed as part of comprehensive evaluation and provision of chronic care management services.  SDOH (Social Determinants of Health) assessments and interventions performed: SDOH Interventions    Flowsheet Row Patient Outreach Telephone from 03/28/2023 in [place] POPULATION HEALTH DEPARTMENT Patient Outreach Telephone from 03/12/2023 in [place] POPULATION HEALTH DEPARTMENT Patient Outreach Telephone from 03/01/2023 in [place] POPULATION HEALTH DEPARTMENT Patient Outreach Telephone from 02/25/2023 in [place] POPULATION HEALTH DEPARTMENT Patient Outreach Telephone from 01/25/2023 in [place] POPULATION HEALTH DEPARTMENT Patient Outreach Telephone from 11/27/2022 in [place] POPULATION HEALTH DEPARTMENT  SDOH Interventions        Food Insecurity Interventions -- -- -- -- Intervention Not Indicated Intervention Not Indicated  Transportation Interventions Intervention Not Indicated -- -- -- -- Intervention Not Indicated  Utilities Interventions Intervention Not Indicated -- -- -- -- --  Alcohol Usage Interventions -- -- --  Intervention Not Indicated (Score <7) -- --  Stress Interventions -- Provide Counseling, Offered [employer]  [Ongoing caregiver strain, emotional support provided] [employer], Provide Counseling  [Needs therapy] -- -- --  Health Literacy Interventions -- -- -- Intervention Not Indicated -- --     Care Plan Allergies  Allergen Reactions   Catfish [Fish Allergy] Shortness Of Breath   Doxycycline Shortness Of Breath   Wellbutrin [Bupropion] Shortness Of Breath, Nausea Only and Palpitations   Tape Swelling    Plastic tape please use paper    Medications Reviewed Today     Reviewed by [name], RN (Registered Nurse) on 03/28/23 at 1100  Med List Status: <None>   Medication Order Taking? Sig Documenting Provider Last Dose Status Informant  cyclobenzaprine (FLEXERIL) 10 MG tablet [id number]  Take 1 tablet (10 mg total) by mouth 3 (three) times daily as needed for muscle spasms. [name], Elige Radon, MD  Active   fluticasone [facility]) 50 MCG/ACT nasal spray [id number] No Place 2 sprays into both nostrils daily. [name], Elige Radon, MD Taking Active   loperamide (IMODIUM) 2 MG capsule [id number] No Take 1 capsule (2 mg total) by mouth as needed for diarrhea or loose stools. [name], Elige Radon, MD Taking Active   QUEtiapine Fumarate (SEROQUEL XR) 150 MG 24 hr tablet [id number]  Take 1 tablet (150 mg total) by mouth at bedtime. [name], Elige Radon, MD  Active   sertraline (ZOLOFT) 100 MG tablet [id number] No Take 1.5 tablets (150 mg total) by mouth at bedtime. [name], Elige Radon, MD Taking Active            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   Prediabetes 02/15/2020   Anxiety 11/18/2019  Depression, recurrent (HCC) 11/18/2019   Seasonal and perennial allergic rhinitis 05/15/2019   Amenorrhea 01/27/2013   Conditions to be addressed/monitored per PCP order:  Chronic  healthcare management needs, tobacco use, anxiety/depression, PNES  Care Plan : RN Care Manager Plan of Care  Updates made by [name], RN since 03/28/2023 12:00 AM     Problem: Health Promotion or Disease Self-Management (General Plan of Care)      Long-Range Goal: Chronic Disease Management   Start Date: 08/23/2022  Expected End Date: 06/27/2023  Priority: High  Note:   Current Barriers:  Knowledge Deficits related to plan of care for management of Anxiety, Depression, allergic rhinitis, and PNES, tobacco use  Chronic Disease Management support and education needs related to Anxiety, Depression, Allergic Rhinitis, and PNES, tobacco use  03/28/23:  To begin CBT therapy this month.  States no seizures since starting on Seroquel and sleeping better.  No change in tobacco use.    RNCM Clinical Goal(s):  Patient will verbalize understanding of plan for management of Anxiety, Depression, Allergic Rhinitis, and PNES, tobacco use  as evidenced by patient report verbalize basic understanding of  Anxiety, Depression, Allergic Rhinitis, and PNES, tobacco use  disease process and self health management plan as evidenced by patient report take all medications exactly as prescribed and will call provider for medication related questions as evidenced by patient report demonstrate understanding of rationale for each prescribed medication as evidenced by patient report attend all scheduled medical appointments as evidenced by patient report and EMR review demonstrate Ongoing adherence to prescribed treatment plan for Anxiety, Depression, Allergic Rhinitis, and PNES, tobacco use as evidenced by patient report and EMR review continue to work with RN Care Manager to address care management and care coordination needs related to  Anxiety, Depression, Allergic Rhinitis, and PNES as evidenced by adherence to CM Team Scheduled appointments through collaboration with RN Care manager, provider, and care team.    Interventions: Inter-disciplinary care team collaboration (see longitudinal plan of care) Evaluation of current treatment plan related to  self management and patient's adherence to plan as established by provider Collaborated with LCSW LCSW referral for CBT-completed  Smoking Cessation Interventions:  (Status:  New goal.) Long Term Goal Reviewed smoking history:  ; currently smoking 1/2 ppd On a scale of 1-10, reports MOTIVATION to quit is 1 On a scale of 1-10, reports CONFIDENCE in quitting is 1 09/24/22:  Patient is not interested in stopping right now.  Evaluation of current treatment plan reviewed Advised patient to discuss smoking cessation options with provider Provided contact information for [place] Quit Line (1-800-QUIT-NOW) Discussed plans with patient for ongoing care management follow up and provided patient with direct contact information for care management team Assessed social determinant of health barriers     (Status:  New goal.)  Long Term Goal Evaluation of current treatment plan related to Anxiety, Depression, Allergic Rhinitis, and PNES , self-management and patient's adherence to plan as established by provider. Discussed plans with patient for ongoing care management follow up and provided patient with direct contact information for care management team Evaluation of current treatment plan related to anxiety, depression, rhinitis, PNES  and patient's adherence to plan as established by provider Reviewed medications with patient Reviewed scheduled/upcoming provider appointments  Discussed plans with patient for ongoing care management follow up and provided patient with direct contact information for care management team Assessed social determinant of health barriers  Patient Goals/Self-Care Activities: Take all medications as prescribed Attend all scheduled  provider appointments Call pharmacy for medication refills 3-7 days in advance of running out of  medications Perform all self care activities independently  Perform IADL's (shopping, preparing meals, housekeeping, managing finances) independently Call provider office for new concerns or questions   Follow Up Plan:  The patient has been provided with contact information for the care management team and has been advised to call with any health related questions or concerns.  The care management team will reach out to the patient again over the next 30 business  days.    Long-Range Goal: Establish Plan of Care for Chronic Disease Management Needs   Priority: High  Note:   Timeframe:  Long-Range Goal Priority:  High Start Date: 08/23/22                            Expected End Date: ongoing                      Follow Up Date 04/29/23   - practice safe sex - schedule appointment for flu shot - schedule appointment for vaccines needed due to my age or health - schedule recommended health tests (blood work, mammogram, colonoscopy, pap test) - schedule and keep appointment for annual check-up    Why is this important?   Screening tests can find diseases early when they are easier to treat.  Your doctor or nurse will talk with you about which tests are important for you.  Getting shots for common diseases like the flu and shingles will help prevent them.  03/28/23: To start CBT 9/17.  Has PCP f/u 10/2   Follow Up:  Patient agrees to Care Plan and Follow-up.  Plan: The Managed Medicaid care management team will reach out to the patient again over the next 30 business  days. and The  Patient has been provided with contact information for the Managed Medicaid care management team and has been advised to call with any health related questions or concerns.  Date/time of next scheduled RN care management/care coordination outreach:  04/29/23 at 130

## 2023-03-28 NOTE — ED Provider Notes (Signed)
[facility]    CSN: [id number] Arrival date & time: 03/28/23  1719      History   Chief Complaint No chief complaint on file.   HPI Jaime Flores is a 37 y.o. female.   The history is provided by the patient.   Patient presents for complaints of chills, body aches, and headaches that have been present for the past 24 hours.  Patient reports she has had a recent exposure to COVID.  She denies fever, ear pain, sore throat, cough, abdominal pain, nausea, vomiting, or diarrhea.  Patient reports she has taken ibuprofen for her headache pain.  Past Medical History:  Diagnosis Date   Amenorrhea 01/27/2013   Angio-edema    Breast pain 03/10/2015   Encounter for menstrual regulation 07/05/2015   History of pseudoseizure    Irregular intermenstrual bleeding 07/28/2015   Irregular periods 07/05/2015   Screening for STD (sexually transmitted disease) 08/07/2013   Seizures (HCC)    Urticaria    UTI (urinary tract infection)    Vaginal discharge 06/10/2013    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   Prediabetes 02/15/2020   Anxiety 11/18/2019   Depression, recurrent (HCC) 11/18/2019   Seasonal and perennial allergic rhinitis 05/15/2019   Amenorrhea 01/27/2013    Past Surgical History:  Procedure Laterality Date   APPENDECTOMY     CHOLECYSTECTOMY     TUBAL LIGATION      OB History     Gravida  2   Para  2   Term  1   Preterm  1   AB      Living  2      SAB      IAB      Ectopic      Multiple      Live Births               Home Medications    Prior to Admission medications   Medication Sig Start Date End Date Taking? Authorizing Provider  cyclobenzaprine (FLEXERIL) 10 MG tablet Take 1 tablet (10 mg total) by mouth 3 (three) times daily as needed for muscle spasms. 03/01/23   [name], Elige Radon, MD  QUEtiapine Fumarate (SEROQUEL XR) 150 MG 24 hr tablet Take 1  tablet (150 mg total) by mouth at bedtime. 03/01/23   [name], Elige Radon, MD  sertraline (ZOLOFT) 100 MG tablet Take 1.5 tablets (150 mg total) by mouth at bedtime. 09/26/22   [name], Elige Radon, MD    Family History Family History  Problem Relation Age of Onset   Autism Son    Diabetes Maternal Grandmother    Stroke Maternal Grandmother    Heart failure Maternal Grandmother    Stroke Maternal Grandfather    Stroke Father    Seizures Father    Allergies Father        penicillin   Heart attack Father    Other Mother        herpes   Hyperlipidemia Mother    Heart failure Paternal Grandfather    Diabetes Paternal Grandmother    Hypertension Paternal Grandmother    Autism Brother    Other Paternal Uncle        heart exploded   Cancer Maternal Aunt    Diabetes Maternal Aunt    Hypertension Maternal Aunt    Immunodeficiency Neg Hx    Urticaria Neg Hx    Eczema Neg  Hx    Atopy Neg Hx    Asthma Neg Hx    Angioedema Neg Hx    Allergic rhinitis Neg Hx     Social History Social History   Tobacco Use   Smoking status: Every Day    Current packs/day: 0.50    Types: Cigarettes   Smokeless tobacco: Never  Vaping Use   Vaping status: Never Used  Substance Use Topics   Alcohol use: No   Drug use: No     Allergies   Catfish [fish allergy], Doxycycline, Wellbutrin [bupropion], and Tape   Review of Systems Review of Systems Per HPI  Physical Exam Triage Vital Signs ED Triage Vitals  Encounter Vitals Group     BP 03/28/23 1736 (!) 115/55     Systolic BP Percentile --      Diastolic BP Percentile --      Pulse Rate 03/28/23 1736 88     Resp 03/28/23 1736 18     Temp 03/28/23 1736 98.7 F (37.1 C)     Temp Source 03/28/23 1736 Oral     SpO2 03/28/23 1736 97 %     Weight --      Height --      Head Circumference --      Peak Flow --      Pain Score 03/28/23 1737 2     Pain Loc --      Pain Education --      Exclude from Growth Chart --    No data  found.  Updated Vital Signs BP (!) 115/55 (BP Location: Right Arm)   Pulse 88   Temp 98.7 F (37.1 C) (Oral)   Resp 18   SpO2 97%   Visual Acuity Right Eye Distance:   Left Eye Distance:   Bilateral Distance:    Right Eye Near:   Left Eye Near:    Bilateral Near:     Physical Exam Vitals and nursing note reviewed.  Constitutional:      General: She is not in acute distress.    Appearance: Normal appearance.  HENT:     Head: Normocephalic.     Right Ear: Tympanic membrane, ear canal and external ear normal.     Left Ear: Tympanic membrane, ear canal and external ear normal.     Nose: Nose normal.     Mouth/Throat:     Mouth: Mucous membranes are moist.  Eyes:     Extraocular Movements: Extraocular movements intact.     Pupils: Pupils are equal, round, and reactive to light.  Cardiovascular:     Rate and Rhythm: Normal rate and regular rhythm.     Pulses: Normal pulses.     Heart sounds: Normal heart sounds.  Pulmonary:     Effort: Pulmonary effort is normal. No respiratory distress.     Breath sounds: Normal breath sounds. No stridor. No wheezing, rhonchi or rales.  Abdominal:     General: Bowel sounds are normal.     Palpations: Abdomen is soft.     Tenderness: There is no abdominal tenderness.  Musculoskeletal:     Cervical back: Normal range of motion.  Skin:    General: Skin is warm and dry.  Neurological:     General: No focal deficit present.     Mental Status: She is alert and oriented to person, place, and time.  Psychiatric:        Mood and Affect: Mood normal.  Behavior: Behavior normal.      UC Treatments / Results  Labs (all labs ordered are listed, but only abnormal results are displayed) Labs Reviewed  SARS CORONAVIRUS 2 (TAT 6-24 HRS)    EKG   Radiology No results found.  Procedures Procedures (including critical care time)  Medications Ordered in UC Medications - No data to display  Initial Impression / Assessment and  Plan / UC Course  I have reviewed the triage vital signs and the nursing notes.  Pertinent labs & imaging results that were available during my care of the patient were reviewed by me and considered in my medical decision making (see chart for details).  The patient is well-appearing, she is in no acute distress, vital signs are stable.  COVID test is pending.  Patient is able to receive Paxlovid if her COVID test is positive.  Supportive care recommendations were provided and discussed with the patient to include increasing fluids, allowing for plenty of rest, and continuing over-the-counter analgesics for pain or discomfort.  Patient was advised if she develops a fever, she should remain home until she has been fever free for at least 24 hours with no medication.  Patient is in agreement with this plan of care and verbalizes understanding.  All questions were answered.  Patient stable for discharge.  Final Clinical Impressions(s) / UC Diagnoses   Final diagnoses:  Exposure to COVID-19 virus  Viral illness     Discharge Instructions      COVID test is pending.  You will be contacted if the pending test result is positive, you will also have access to your results via MyChart. Continue over-the-counter ibuprofen or Tylenol as needed for pain, fever, or general discomfort. Increase fluids and allow for plenty of rest. If you develop fever, you should remain home until you have been fever free for at least 24 hours with no medication. Follow-up as needed.     ED Prescriptions   None    PDMP not reviewed this encounter.   Abran Cantor, NP 03/28/23 1812

## 2023-03-28 NOTE — ED Triage Notes (Signed)
Headaches, chills, body aches since yesterday.  Exposed to covid.

## 2023-03-28 NOTE — Patient Instructions (Signed)
Hi Ms. Handrahan, I am so glad you are feeling better-have a nice day!  Ms. Divito was given information about Medicaid Managed Care team care coordination services as a part of their Healthy [facility] Medicaid benefit. [name] verbally consented to engagement with the [facility] Managed Care team.   If you are experiencing a medical emergency, please call 911 or report to your local emergency department or urgent care.   If you have a non-emergency medical problem during routine business hours, please contact your provider's office and ask to speak with a nurse.   For questions related to your Healthy [facility] health plan, please call: [phone] or visit the homepage here: [email or web address]  If you would like to schedule transportation through your Healthy [facility] plan, please call the following number at least 2 days in advance of your appointment: [phone]  For information about your ride after you set it up, call Ride Assist at [phone]. Use this number to activate a Will Call pickup, or if your transportation is late for a scheduled pickup. Use this number, too, if you need to make a change or cancel a previously scheduled reservation.  If you need transportation services right away, call [phone]. The after-hours call center is staffed 24 hours to handle ride assistance and urgent reservation requests (including discharges) 365 days a year. Urgent trips include sick visits, hospital discharge requests and life-sustaining treatment.  Call the [facility] Line at [phone], at any time, 24 hours a day, 7 days a week. If you are in danger or need immediate medical attention call 911.  If you would like help to quit smoking, call 1-800-QUIT-NOW ((780)476-0632) OR Espaol: 1-855-Djelo-Ya (2-355-732-2025) o para ms informacin haga clic aqu or Text READY to 427-062 to register via text  Ms. Emelda Fear - following  are the goals we discussed in your visit today:   Goals Addressed    Timeframe:  Long-Range Goal Priority:  High Start Date: 08/23/22                            Expected End Date: ongoing                      Follow Up Date 04/29/23   - practice safe sex - schedule appointment for flu shot - schedule appointment for vaccines needed due to my age or health - schedule recommended health tests (blood work, mammogram, colonoscopy, pap test) - schedule and keep appointment for annual check-up    Why is this important?   Screening tests can find diseases early when they are easier to treat.  Your doctor or nurse will talk with you about which tests are important for you.  Getting shots for common diseases like the flu and shingles will help prevent them.  03/28/23: To start CBT 9/17.  Has PCP f/u 10/2  Patient verbalizes understanding of instructions and care plan provided today and agrees to view in MyChart. Active MyChart status and patient understanding of how to access instructions and care plan via MyChart confirmed with patient.     The Managed Medicaid care management team will reach out to the patient again over the next 30 business  days.  The  Patient  has been provided with contact information for the Managed Medicaid care management team and has been advised to call with any health related questions or concerns.   [name] RN, BSN [place]  [place] [employer], production - Managed Medicaid High Risk [phone]   Following is a copy of your plan of care:  Care Plan : RN Care Manager Plan of Care  Updates made by [name], RN since 03/28/2023 12:00 AM     Problem: Health Promotion or Disease Self-Management (General Plan of Care)      Long-Range Goal: Chronic Disease Management   Start Date: 08/23/2022  Expected End Date: 06/27/2023  Priority: High  Note:   Current Barriers:  Knowledge Deficits related to plan of care for management of  Anxiety, Depression, allergic rhinitis, and PNES, tobacco use  Chronic Disease Management support and education needs related to Anxiety, Depression, Allergic Rhinitis, and PNES, tobacco use  03/28/23:  To begin CBT therapy this month.  States no seizures since starting on Seroquel and sleeping better.  No change in tobacco use.    RNCM Clinical Goal(s):  Patient will verbalize understanding of plan for management of Anxiety, Depression, Allergic Rhinitis, and PNES, tobacco use  as evidenced by patient report verbalize basic understanding of  Anxiety, Depression, Allergic Rhinitis, and PNES, tobacco use  disease process and self health management plan as evidenced by patient report take all medications exactly as prescribed and will call provider for medication related questions as evidenced by patient report demonstrate understanding of rationale for each prescribed medication as evidenced by patient report attend all scheduled medical appointments as evidenced by patient report and EMR review demonstrate Ongoing adherence to prescribed treatment plan for Anxiety, Depression, Allergic Rhinitis, and PNES, tobacco use as evidenced by patient report and EMR review continue to work with RN Care Manager to address care management and care coordination needs related to  Anxiety, Depression, Allergic Rhinitis, and PNES as evidenced by adherence to CM Team Scheduled appointments through collaboration with RN Care manager, provider, and care team.   Interventions: Inter-disciplinary care team collaboration (see longitudinal plan of care) Evaluation of current treatment plan related to  self management and patient's adherence to plan as established by provider Collaborated with LCSW LCSW referral for CBT-completed  Smoking Cessation Interventions:  (Status:  New goal.) Long Term Goal Reviewed smoking history:  ; currently smoking 1/2 ppd On a scale of 1-10, reports MOTIVATION to quit is 1 On a scale of  1-10, reports CONFIDENCE in quitting is 1 09/24/22:  Patient is not interested in stopping right now.  Evaluation of current treatment plan reviewed Advised patient to discuss smoking cessation options with provider Provided contact information for [place] Quit Line (1-800-QUIT-NOW) Discussed plans with patient for ongoing care management follow up and provided patient with direct contact information for care management team Assessed social determinant of health barriers     (Status:  New goal.)  Long Term Goal Evaluation of current treatment plan related to Anxiety, Depression, Allergic Rhinitis, and PNES , self-management and patient's adherence to plan as established by provider. Discussed plans with patient for ongoing care management follow up and provided patient with direct contact information for care management team Evaluation of current treatment plan related to anxiety, depression, rhinitis, PNES  and patient's adherence to plan as established by provider Reviewed medications with patient Reviewed scheduled/upcoming provider appointments  Discussed plans with patient for ongoing care management follow up and provided patient with direct contact information for care management team Assessed social determinant of health barriers  Patient Goals/Self-Care Activities: Take all medications as prescribed Attend all scheduled provider appointments Call pharmacy for medication refills 3-7 days in advance  of running out of medications Perform all self care activities independently  Perform IADL's (shopping, preparing meals, housekeeping, managing finances) independently Call provider office for new concerns or questions   Follow Up Plan:  The patient has been provided with contact information for the care management team and has been advised to call with any health related questions or concerns.  The care management team will reach out to the patient again over the next 30 business  days.

## 2023-03-29 ENCOUNTER — Other Ambulatory Visit: Payer: Medicaid Other | Admitting: Licensed Clinical Social Worker

## 2023-03-29 LAB — SARS CORONAVIRUS 2 (TAT 6-24 HRS): SARS Coronavirus 2: NEGATIVE

## 2023-03-29 NOTE — Patient Outreach (Signed)
Medicaid Managed Care Social Work Note  03/29/2023 Name:  Jaime Flores MRN:  [id number] DOB:  [birth year]  Jaime Flores is an 37 y.o. year old female who is a primary patient of [name], Elige Radon, MD.  The Medicaid Managed Care Coordination team was consulted for assistance with:  Mental Health Counseling and Resources  Ms. Rappe was given information about Medicaid Managed Care Coordination team services today. [name] Patient agreed to services and verbal consent obtained.  Engaged with patient  for by telephone forfollow up visit in response to referral for case management and/or care coordination services.   Assessments/Interventions:  Review of past medical history, allergies, medications, health status, including review of consultants reports, laboratory and other test data, was performed as part of comprehensive evaluation and provision of chronic care management services.  SDOH: (Social Determinant of Health) assessments and interventions performed: SDOH Interventions    Flowsheet Row Patient Outreach Telephone from 03/29/2023 in [place] POPULATION HEALTH DEPARTMENT Patient Outreach Telephone from 03/28/2023 in [place] POPULATION HEALTH DEPARTMENT Patient Outreach Telephone from 03/12/2023 in [place] POPULATION HEALTH DEPARTMENT Patient Outreach Telephone from 03/01/2023 in [place] POPULATION HEALTH DEPARTMENT Patient Outreach Telephone from 02/25/2023 in [place] POPULATION HEALTH DEPARTMENT Patient Outreach Telephone from 01/25/2023 in [place] POPULATION HEALTH DEPARTMENT  SDOH Interventions        Food Insecurity Interventions -- -- -- -- -- Intervention Not Indicated  Transportation Interventions -- Intervention Not Indicated -- -- -- --  Utilities Interventions -- Intervention Not Indicated -- -- -- --  Alcohol Usage Interventions -- -- -- -- Intervention Not Indicated (Score <7) --  Stress Interventions Offered [employer],  Provide Counseling  [Upcoming initial therapy appointment set for this month] -- Provide Counseling, Offered [employer]  [Ongoing caregiver strain, emotional support provided] [employer], Provide Counseling  [Needs therapy] -- --  Health Literacy Interventions -- -- -- -- Intervention Not Indicated --       Advanced Directives Status:  See Care Plan for related entries.  Care Plan                 Allergies  Allergen Reactions   Catfish [Fish Allergy] Shortness Of Breath   Doxycycline Shortness Of Breath   Wellbutrin [Bupropion] Shortness Of Breath, Nausea Only and Palpitations   Tape Swelling    Plastic tape please use paper    Medications Reviewed Today   Medications were not reviewed in this encounter     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   Prediabetes 02/15/2020   Anxiety 11/18/2019   Depression, recurrent (HCC) 11/18/2019   Seasonal and perennial allergic rhinitis 05/15/2019   Amenorrhea 01/27/2013   Conditions to be addressed/monitored per PCP order:  Anxiety and Depression  Care Plan : LCSW Plan of Care  Updates made by Gustavus Bryant, LCSW since 03/29/2023 12:00 AM     Problem: Depression Identification (Depression)      Goal: Depressive Symptoms Identified   Note:   Priority: High  Timeframe:  Short-Range Goal Priority:  High Start Date:   03/01/23          Expected End Date:  ongoing                     Follow Up Date 04/15/23 at 11 am  Current Barriers:  Knowledge Deficits related to plan of care  for management of Anxiety, Depression, and 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 Caregiver strain- mother of autistic child  02/25/23-decreased seizure activity per patient-seen in ED 7/25 for seizure.  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  Patient states she needs CBT and having trouble  finding therapist. Patient with increased stress and anxiety-smoking unchanged  LCSW Clinical Goal(s):  Patient will verbalize understanding of plan for management of Anxiety, Depression, and Caregiver Strain as evidenced by patient report verbalize basic understanding of  Anxiety, Depression and self health management plan as evidenced by patient report take all medications exactly as prescribed and will call provider for medication related questions as evidenced by patient report demonstrate understanding of rationale for each prescribed medication as evidenced by patient report attend all scheduled medical appointments as evidenced by patient report and EMR review demonstrate Ongoing adherence to prescribed treatment plan for Anxiety, Depression, Allergic Rhinitis, and PNES, tobacco use as evidenced by patient report and EMR review continue to work with LCSW to address care management and care coordination needs related to  Anxiety and Depression as evidenced by adherence to CM Team Scheduled appointments through collaboration with RN Care manager, provider, and care team.   Interventions: Inter-disciplinary care team collaboration (see longitudinal plan of care) Evaluation of current treatment plan related to  self management and patient's adherence to plan as established by provider Advised patient to exercise 10-20 minutes everyday, go outside in the sunlight everyday for at least 10-15 minutes, drink 6-8 glasses of water a day, take vitamins/medications as directed and try to increase sleep.  Discussed plans with patient for ongoing care management follow up and provided patient with direct contact information for care management team Assessed social determinant of health barriers  Discussed plans with patient for ongoing care management follow up and provided patient with direct contact information for care management team Evaluation of current treatment plan related to anxiety, depression,  rhinitis, PNES  and patient's adherence to plan as established by provider Reviewed medications with patient Reviewed scheduled/upcoming provider appointments  Discussed plans with patient for ongoing care management follow up and provided patient with direct contact information for care management team Assessed social determinant of health barriers Patient was educated on available mental health resources within their area that accept Medicaid and offer counseling and psychiatry. Patient is agreeable to referral to [facility] for counseling. [facility] LCSW made referral on 03/01/23.  Email sent to patient today with available mental health resources within her area that accept Medicaid and offer the services that she is interested in.  Emotional support provided. CBT intervention implemented regarding "being mentally fit" by combating negative thinking and replacing it with uplifting support, hope and positivity. Update- Patient reports that her seizures and mood management has improved with new self-care tools she has been implementing into her daily routine such as : grounding exercises, swinging, playing with animals, walking away when overstimulated, redirection, taking a quick car ride to get fresh air. She has not had a seizure in over two weeks. She shares that she is looking forward to her initial therapy appointment this month. Positive reinforcement provided for the great work she has put into her self-care over the last 30 days.   Patient Goals/Self-Care Activities: Take all medications as prescribed Attend all scheduled provider appointments Call pharmacy for medication refills 3-7 days in advance of running out of medications Perform all self care activities independently  Perform IADL's (  shopping, preparing meals, housekeeping, managing finances) independently Call provider office for new concerns or questions   Follow Up Plan:  The patient has been provided with contact  information for the care management team and has been advised to call with any health related questions or concerns.  The care management team will reach out to the patient again over the next 30 business  days.     Follow up:  Patient agrees to Care Plan and Follow-up.  Plan: The Managed Medicaid care management team will reach out to the patient again over the next 30 days.  Dickie La, BSW, MSW, [employer] Managed Medicaid LCSW [facility]  [facility] [email or web address] Phone: [phone]

## 2023-03-29 NOTE — Patient Instructions (Signed)
Visit Information  Jaime Flores was given information about Medicaid Managed Care team care coordination services as a part of their Healthy [facility] Medicaid benefit. Jaime Flores verbally consented to engagement with the [facility] Managed Care team.   If you are experiencing a medical emergency, please call 911 or report to your local emergency department or urgent care.   If you have a non-emergency medical problem during routine business hours, please contact your provider's office and ask to speak with a nurse.   For questions related to your Healthy [facility] health plan, please call: [phone] or visit the homepage here: [email or web address]  If you would like to schedule transportation through your Healthy [facility] plan, please call the following number at least 2 days in advance of your appointment: [phone]  For information about your ride after you set it up, call Ride Assist at [phone]. Use this number to activate a Will Call pickup, or if your transportation is late for a scheduled pickup. Use this number, too, if you need to make a change or cancel a previously scheduled reservation.  If you need transportation services right away, call [phone]. The after-hours call center is staffed 24 hours to handle ride assistance and urgent reservation requests (including discharges) 365 days a year. Urgent trips include sick visits, hospital discharge requests and life-sustaining treatment.  Call the [facility] Line at [phone], at any time, 24 hours a day, 7 days a week. If you are in danger or need immediate medical attention call 911.  If you would like help to quit smoking, call 1-800-QUIT-NOW (315-628-1790) OR Espaol: 1-855-Djelo-Ya (4-742-595-6387) o para ms informacin haga clic aqu or Text READY to 564-332 to register via text  Following is a copy of your plan of care:  Care Plan : LCSW Plan of Care   Updates made by Jaime Bryant, LCSW since 03/29/2023 12:00 AM     Problem: Depression Identification (Depression)      Goal: Depressive Symptoms Identified   Note:   Priority: High  Timeframe:  Short-Range Goal Priority:  High Start Date:   03/01/23          Expected End Date:  ongoing                     Follow Up Date 04/15/23 at 11 am  Current Barriers:  Knowledge Deficits related to plan of care for management of Anxiety, Depression, and 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 Caregiver strain- mother of autistic child  02/25/23-decreased seizure activity per patient-seen in ED 7/25 for seizure.  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  Patient states she needs CBT and having trouble finding therapist. Patient with increased stress and anxiety-smoking unchanged  LCSW Clinical Goal(s):  Patient will verbalize understanding of plan for management of Anxiety, Depression, and Caregiver Strain as evidenced by patient report verbalize basic understanding of  Anxiety, Depression and self health management plan as evidenced by patient report take all medications exactly as prescribed and will call provider for medication related questions as evidenced by patient report demonstrate understanding of rationale for each prescribed medication as evidenced by patient report attend all scheduled medical appointments as evidenced by patient report and EMR review demonstrate Ongoing adherence to prescribed treatment plan for Anxiety, Depression, Allergic Rhinitis, and PNES, tobacco use as evidenced by patient report and EMR review continue to work with LCSW to address care management and care  coordination needs related to  Anxiety and Depression as evidenced by adherence to CM Team Scheduled appointments through collaboration with RN Care manager, provider, and care team.   Patient Goals/Self-Care Activities: Take all medications as  prescribed Attend all scheduled provider appointments Call pharmacy for medication refills 3-7 days in advance of running out of medications Perform all self care activities independently  Perform IADL's (shopping, preparing meals, housekeeping, managing finances) independently Call provider office for new concerns or questions   Follow Up Plan:  The patient has been provided with contact information for the care management team and has been advised to call with any health related questions or concerns.  The care management team will reach out to the patient again over the next 30 business  days.      24- Hour Availability:    [facility]  [address] [place], [place] [phone] Crisis [phone]   [facility] [phone]  [place] [facility]  [phone]    [facility]  [phone] (after hours)   Therapeutic Alternative/Mobile Crisis   [phone]   [place] National Suicide Hotline  252 317 4101 Len Child[place]) [place] 025   Call 5063312782 for mental health emergencies   [facility]  [phone]);  [place] and [employer]  [phone]); [place], [place], [place], [place], [place], [place], [place]    [place] [facility] for [facility] Residents For 24/7 walk-up access to mental health services for [facility] children (4+), adolescents and adults, please visit the [facility] located at [address] in [place], [place].  *[facility] also provides comprehensive outpatient behavioral health services in a variety of locations around the [place].  Connect With [place] [address] [place], [place] [address] HelpLine: (717)040-3048 or 1-281-502-3905  Get Directions  Find Help 24/7 By Phone Call our 24-hour HelpLine at 640-207-2278 or 714-578-2291 for immediate assistance for mental health and substance abuse  issues.  Walk-In Help [place]: [facility] (Ages 4 and Up) [place]: Emergency Dept., [facility] Additional Resources National Hopeline Network: 1-800-SUICIDE The National Suicide Prevention Lifeline: 1-800-273-TALK     Dickie La, BSW, MSW, LCSW Managed Medicaid LCSW [facility]  [facility] [email or web address] Phone: [phone]

## 2023-04-04 ENCOUNTER — Telehealth (HOSPITAL_COMMUNITY): Payer: Self-pay

## 2023-04-04 ENCOUNTER — Encounter: Payer: Self-pay | Admitting: Family Medicine

## 2023-04-04 NOTE — Telephone Encounter (Signed)
04/08/23 appt confirmed with patient

## 2023-04-08 ENCOUNTER — Ambulatory Visit (INDEPENDENT_AMBULATORY_CARE_PROVIDER_SITE_OTHER): Payer: Medicaid Other | Admitting: Clinical

## 2023-04-08 ENCOUNTER — Encounter (HOSPITAL_COMMUNITY): Payer: Self-pay

## 2023-04-08 DIAGNOSIS — F419 Anxiety disorder, unspecified: Secondary | ICD-10-CM | POA: Diagnosis not present

## 2023-04-08 DIAGNOSIS — F331 Major depressive disorder, recurrent, moderate: Secondary | ICD-10-CM

## 2023-04-08 NOTE — Progress Notes (Signed)
Virtual Visit via Video Note  I connected with Jaime Flores on 04/08/23 at  1:00 PM EDT by a video enabled telemedicine application and verified that I am speaking with the correct person using two identifiers.  Location: Patient: home Provider: office   I discussed the limitations of evaluation and management by telemedicine and the availability of in person appointments. The patient expressed understanding and agreed to proceed.    Comprehensive Clinical Assessment (CCA) Note  04/08/2023 Jaime Flores [id number]  Chief Complaint: Difficulty with Depression and Anxiety Visit Diagnosis: Recurrent Moderate MDD with Anxiety    CCA Screening, Triage and Referral (STR)  Patient Reported Information How did you hear about [place]? No data recorded Referral name: No data recorded Referral phone number: No data recorded  Whom do you see for routine medical problems? No data recorded Practice/Facility Name: No data recorded Practice/Facility Phone Number: No data recorded Name of Contact: No data recorded Contact Number: No data recorded Contact Fax Number: No data recorded Prescriber Name: No data recorded Prescriber Address (if known): No data recorded  What Is the Reason for Your Visit/Call Today? No data recorded How Long Has This Been Causing You Problems? No data recorded What Do You Feel Would Help You the Most Today? No data recorded  Have You Recently Been in Any Inpatient Treatment (Hospital/Detox/Crisis Center/28-Day Program)? No data recorded Name/Location of Program/Hospital:No data recorded How Long Were You There? No data recorded When Were You Discharged? No data recorded  Have You Ever Received Services From [facility] Before? No data recorded Who Do You See at [facility]? No data recorded  Have You Recently Had Any Thoughts About Hurting Yourself? No data recorded Are You Planning to Commit Suicide/Harm Yourself At This time? No data recorded  Have  you Recently Had Thoughts About Hurting Someone Karolee Ohs? No data recorded Explanation: No data recorded  Have You Used Any Alcohol or Drugs in the Past 24 Hours? No data recorded How Long Ago Did You Use Drugs or Alcohol? No data recorded What Did You Use and How Much? No data recorded  Do You Currently Have a Therapist/Psychiatrist? No data recorded Name of Therapist/Psychiatrist: No data recorded  Have You Been Recently Discharged From Any Office Practice or Programs? No data recorded Explanation of Discharge From Practice/Program: No data recorded    CCA Screening Triage Referral Assessment Type of Contact: No data recorded Is this Initial or Reassessment? No data recorded Date Telepsych consult ordered in [facility]:  No data recorded Time Telepsych consult ordered in [facility]:  No data recorded  Patient Reported Information Reviewed? No data recorded Patient Left Without Being Seen? No data recorded Reason for Not Completing Assessment: No data recorded  Collateral Involvement: No data recorded  Does Patient Have a Court Appointed Legal Guardian? No data recorded Name and Contact of Legal Guardian: No data recorded If Minor and Not Living with Parent(s), Who has Custody? No data recorded Is CPS involved or ever been involved? No data recorded Is APS involved or ever been involved? No data recorded  Patient Determined To Be At Risk for Harm To Self or Others Based on Review of Patient Reported Information or Presenting Complaint? No data recorded Method: No data recorded Availability of Means: No data recorded Intent: No data recorded Notification Required: No data recorded Additional Information for Danger to Others Potential: No data recorded Additional Comments for Danger to Others Potential: No data recorded Are There Guns or Other Weapons in Your Home?  No data recorded Types of Guns/Weapons: No data recorded Are These Weapons Safely Secured?                            No data  recorded Who Could Verify You Are Able To Have These Secured: No data recorded Do You Have any Outstanding Charges, Pending Court Dates, Parole/Probation? No data recorded Contacted To Inform of Risk of Harm To Self or Others: No data recorded  Location of Assessment: No data recorded  Does Patient Present under Involuntary Commitment? No data recorded IVC Papers Initial File Date: No data recorded  [place] of Residence: No data recorded  Patient Currently Receiving the Following Services: No data recorded  Determination of Need: No data recorded  Options For Referral: No data recorded    CCA Biopsychosocial Intake/Chief Complaint:  THe patient was referred by her PCP through [facility] for further evaluation for [facility] treatment service with indication of difficulty with stress management (stress seizures) .  Current Symptoms/Problems: The patient notes difficulty with "stress seizures".   Patient Reported Schizophrenia/Schizoaffective Diagnosis in Past: No   Strengths: self-awareness, phyiscal exercise (walking)  Preferences: Houseworking (notes having a autistic child at home), TV,  Abilities: None noted   Type of Services Patient Feels are Needed: Individual Therapy / Zoloft   Initial Clinical Notes/Concerns: The patient notes no prior couseling involvement. Patient notes prior inpatient hospitalization last year 2023 in October for self harm.   Mental Health Symptoms Depression:   Hopelessness; Fatigue; Tearfulness; Worthlessness; Change in energy/activity; Difficulty Concentrating; Sleep (too much or little); Increase/decrease in appetite; Irritability; Weight gain/loss   Duration of Depressive symptoms:  Greater than two weeks   Mania:   None   Anxiety:    None; Tension; Worrying; Sleep; Irritability; Fatigue; Difficulty concentrating; Restlessness   Psychosis:   None   Duration of Psychotic symptoms: NA  Trauma:   None   Obsessions:    None   Compulsions:   None   Inattention:   None   Hyperactivity/Impulsivity:   None   Oppositional/Defiant Behaviors:   None   Emotional Irregularity:   None   Other Mood/Personality Symptoms:   NA    Mental Status Exam Appearance and self-care  Stature:   Average   Weight:   Average weight   Clothing:   Casual   Grooming:   Normal   Cosmetic use:   None   Posture/gait:   Normal   Motor activity:   Not Remarkable   Sensorium  Attention:   Normal   Concentration:   Normal   Orientation:   X5   Recall/memory:   Normal   Affect and Mood  Affect:   Appropriate   Mood:   Depressed; Anxious   Relating  Eye contact:   Normal   Facial expression:   Responsive; Sad; Depressed   Attitude toward examiner:   Cooperative   Thought and Language  Speech flow:  Normal   Thought content:   Appropriate to Mood and Circumstances   Preoccupation:   None   Hallucinations:   None   Organization:  logical  [employer] of Knowledge:   Average   Intelligence:   Average   Abstraction:   Normal   Judgement:   Fair   Dance movement psychotherapist:   Realistic   Insight:   Fair   Decision Making:   Normal   Social Functioning  Social Maturity:   Responsible  Social Judgement:   Normal   Stress  Stressors:   Other (Comment); Family conflict; Illness; Legal; Transitions; Relationship (Stepfathers Mother passed last month. Moved 2x with in last year. Pt at annual check up and notes they found Cancer cells. Pts husband is currently in custody battle for 3 kids. Court sept 25th for 50-C order.Conflict in her relationship. Husband has TBI)   Coping Ability:   Overwhelmed; Exhausted   Skill Deficits:   Self-control; Self-care   Supports:   Family; Friends/Service system     Religion: Religion/Spirituality Are You A Religious Person?: No How Might This Affect Treatment?: NA  Leisure/Recreation: Leisure /  Recreation Do You Have Hobbies?: No  Exercise/Diet: Exercise/Diet Do You Exercise?: Yes What Type of Exercise Do You Do?: Run/Walk How Many Times a Week Do You Exercise?: 6-7 times a week Have You Gained or Lost A Significant Amount of Weight in the Past Six Months?: Yes-Lost Number of Pounds Lost?: 15 Do You Follow a Special Diet?: No Do You Have Any Trouble Sleeping?: Yes Explanation of Sleeping Difficulties: Difficulty with falling asleep   CCA Employment/Education Employment/Work Situation: Employment / Work Situation Employment Situation: Unemployed Patient's Job has Been Impacted by Current Illness: No What is the Longest Time Patient has Held a Job?: [employer] Where was the Patient Employed at that Time?: Aug 18th 2022 - June 19th  2024 Has Patient ever Been in the [employer]?: No  Education: Education Is Patient Currently Attending School?: No Last Grade Completed: 12 ("some college") Name of High School: [employer] Did Garment/textile technologist From [employer]?: Yes Did Theme park manager?: Yes What Type of College Degree Do you Have?: The patient notes no college degrees What Was Your Major?: NA Did You Have Any Special Interests In School?: NA Did You Have An Individualized Education Program (IIEP): No Did You Have Any Difficulty At School?: No Patient's Education Has Been Impacted by Current Illness: No   CCA Family/Childhood History Family and Relationship History: Family history Marital status: Married Number of Years Married: 1 What types of issues is patient dealing with in the relationship?: The patient notes her husband has a TBI and the complications from this creates conflict in the relationship Additional relationship information: NA Are you sexually active?: Yes What is your sexual orientation?: Heterosexual Has your sexual activity been affected by drugs, alcohol, medication, or emotional stress?: NA Does patient have children?:  Yes How many children?: 2 How is patient's relationship with their children?: The patient notes she a good relationsjip with her son who is Autistic and notes with her daughter the relationship is conflictual.  Childhood History:  Childhood History By whom was/is the patient raised?: Mother, Grandparents Additional childhood history information: The patient notes she was raised by Mother and Grandparents Description of patient's relationship with caregiver when they were a child: The patient notes her relationship with her Mother as a younger child is hard to remember Patient's description of current relationship with people who raised him/her: The patient notes that she has a "absolutely horrible" relationship with her Mother currently . How were you disciplined when you got in trouble as a child/adolescent?: Grounding Does patient have siblings?: Yes Number of Siblings: 3 Description of patient's current relationship with siblings: The patient notes having 2 sisters and 1 brother she has never met . The patient has a twin sister and 1 older sister. The patient notes that she has a normal realtionship /typical with her sisters. Did patient suffer  any verbal/emotional/physical/sexual abuse as a child?: No Did patient suffer from severe childhood neglect?: No Has patient ever been sexually abused/assaulted/raped as an adolescent or adult?: No Was the patient ever a victim of a crime or a disaster?: No Witnessed domestic violence?: No Has patient been affected by domestic violence as an adult?: No  Child/Adolescent Assessment:     CCA Substance Use Alcohol/Drug Use: Alcohol / Drug Use Pain Medications: see MAR Prescriptions: see MAR Over the Counter: None History of alcohol / drug use?: No history of alcohol / drug abuse Longest period of sobriety (when/how long): NA                         ASAM's:  Six Dimensions of Multidimensional Assessment  Dimension 1:  Acute  Intoxication and/or Withdrawal Potential:      Dimension 2:  Biomedical Conditions and Complications:      Dimension 3:  Emotional, Behavioral, or Cognitive Conditions and Complications:     Dimension 4:  Readiness to Change:     Dimension 5:  Relapse, Continued use, or Continued Problem Potential:     Dimension 6:  Recovery/Living Environment:     ASAM Severity Score:    ASAM Recommended Level of Treatment:     Substance use Disorder (SUD)    Recommendations for Services/Supports/Treatments: Recommendations for Services/Supports/Treatments Recommendations For Services/Supports/Treatments: Individual Therapy, Medication Management  DSM5 Diagnoses: 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   Prediabetes 02/15/2020   Anxiety 11/18/2019   Depression, recurrent (HCC) 11/18/2019   Seasonal and perennial allergic rhinitis 05/15/2019   Amenorrhea 01/27/2013    Patient Centered Plan: Patient is on the following Treatment Plan(s):  Recurrent MDD Moderate with Anxiety   Referrals to Alternative Service(s): Referred to Alternative Service(s):   Place:   Date:   Time:    Referred to Alternative Service(s):   Place:   Date:   Time:    Referred to Alternative Service(s):   Place:   Date:   Time:    Referred to Alternative Service(s):   Place:   Date:   Time:      Collaboration of Care: No additional collaboration for this session.   Patient/Guardian was advised Release of Information must be obtained prior to any record release in order to collaborate their care with an outside provider. Patient/Guardian was advised if they have not already done so to contact the registration department to sign all necessary forms in order for [place] to release information regarding their care.   Consent: Patient/Guardian gives verbal consent for treatment and assignment of benefits for services provided during this visit. Patient/Guardian  expressed understanding and agreed to proceed.     I discussed the assessment and treatment plan with the patient. The patient was provided an opportunity to ask questions and all were answered. The patient agreed with the plan and demonstrated an understanding of the instructions.   The patient was advised to call back or seek an in-person evaluation if the symptoms worsen or if the condition fails to improve as anticipated.  I provided 60 minutes of non-face-to-face time during this encounter.   Winfred Burn, LCSW   04/08/2023

## 2023-04-11 ENCOUNTER — Other Ambulatory Visit: Payer: Self-pay | Admitting: Family Medicine

## 2023-04-11 ENCOUNTER — Telehealth: Payer: Self-pay | Admitting: Family Medicine

## 2023-04-11 DIAGNOSIS — F339 Major depressive disorder, recurrent, unspecified: Secondary | ICD-10-CM

## 2023-04-11 DIAGNOSIS — F419 Anxiety disorder, unspecified: Secondary | ICD-10-CM

## 2023-04-11 NOTE — Telephone Encounter (Signed)
  Prescription Request  04/11/2023  Is this a "Controlled Substance" medicine? yes  Have you seen your PCP in the last 2 weeks? No, last seen 03-01-2023  If YES, route message to pool  -  If NO, patient needs to be scheduled for appointment.  What is the name of the medication or equipment? Zoloft (Generic?)  Have you contacted your pharmacy to request a refill? yes   Which pharmacy would you like this sent to? [facility]   Patient notified that their request is being sent to the clinical staff for review and that they should receive a response within 2 business days.    [name] pt.  She is out of meds & she is having seizures from not taking meds.

## 2023-04-11 NOTE — Telephone Encounter (Signed)
Refill was escribed at 430 today

## 2023-04-15 ENCOUNTER — Other Ambulatory Visit: Payer: Medicaid Other | Admitting: Licensed Clinical Social Worker

## 2023-04-15 NOTE — Patient Instructions (Signed)
Visit Information  Jaime Flores was given information about Medicaid Managed Care team care coordination services as a part of their Healthy [facility] Medicaid benefit. Jaime Flores verbally consented to engagement with the [facility] Managed Care team.   If you are experiencing a medical emergency, please call 911 or report to your local emergency department or urgent care.   If you have a non-emergency medical problem during routine business hours, please contact your provider's office and ask to speak with a nurse.   For questions related to your Healthy [facility] health plan, please call: [phone] or visit the homepage here: [email or web address]  If you would like to schedule transportation through your Healthy [facility] plan, please call the following number at least 2 days in advance of your appointment: [phone]  For information about your ride after you set it up, call Ride Assist at [phone]. Use this number to activate a Will Call pickup, or if your transportation is late for a scheduled pickup. Use this number, too, if you need to make a change or cancel a previously scheduled reservation.  If you need transportation services right away, call [phone]. The after-hours call center is staffed 24 hours to handle ride assistance and urgent reservation requests (including discharges) 365 days a year. Urgent trips include sick visits, hospital discharge requests and life-sustaining treatment.  Call the [facility] Line at [phone], at any time, 24 hours a day, 7 days a week. If you are in danger or need immediate medical attention call 911.  If you would like help to quit smoking, call 1-800-QUIT-NOW ((571)209-5547) OR Espaol: 1-855-Djelo-Ya (2-202-542-7062) o para ms informacin haga clic aqu or Text READY to 376-283 to register via text  Following is a copy of your plan of care:  Care Plan : LCSW Plan of Care   Updates made by Gustavus Bryant, LCSW since 04/15/2023 12:00 AM     Problem: Depression Identification (Depression)      Goal: Depressive Symptoms Identified   Note:   Priority: High  Timeframe:  Short-Range Goal Priority:  High Start Date:   03/01/23          Expected End Date:  ongoing                     Follow Up Date 05/15/23 at 11 am  Current Barriers:  Knowledge Deficits related to plan of care for management of Anxiety, Depression, and 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 Caregiver strain- mother of autistic child  02/25/23-decreased seizure activity per patient-seen in ED 7/25 for seizure.  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  Patient states she needs CBT and having trouble finding therapist. Patient with increased stress and anxiety-smoking unchanged  LCSW Clinical Goal(s):  Patient will verbalize understanding of plan for management of Anxiety, Depression, and Caregiver Strain as evidenced by patient report verbalize basic understanding of  Anxiety, Depression and self health management plan as evidenced by patient report take all medications exactly as prescribed and will call provider for medication related questions as evidenced by patient report demonstrate understanding of rationale for each prescribed medication as evidenced by patient report attend all scheduled medical appointments as evidenced by patient report and EMR review demonstrate Ongoing adherence to prescribed treatment plan for Anxiety, Depression, Allergic Rhinitis, and PNES, tobacco use as evidenced by patient report and EMR review continue to work with LCSW to address care management and care  coordination needs related to  Anxiety and Depression as evidenced by adherence to CM Team Scheduled appointments through collaboration with RN Care manager, provider, and care team.   Patient Goals/Self-Care Activities: Take all medications as  prescribed Attend all scheduled provider appointments Call pharmacy for medication refills 3-7 days in advance of running out of medications Perform all self care activities independently  Perform IADL's (shopping, preparing meals, housekeeping, managing finances) independently Call provider office for new concerns or questions   Follow Up Plan:  The patient has been provided with contact information for the care management team and has been advised to call with any health related questions or concerns.  The care management team will reach out to the patient again over the next 30 business  days.      24- Hour Availability:    [facility]  [address] [place], [place] [phone] Crisis [phone]   [facility] [phone]  [place] Crisis Service  [phone]    [facility]  [phone] (after hours)   Therapeutic Alternative/Mobile Crisis   [phone]   [place] National Suicide Hotline  413-233-0603 [place]) [place] 606   Call [phone] for mental health emergencies   [facility]  [phone]);  [place] and [employer]  [phone]); [place], [place], [place], [place], [place], [place], [place]    [place] [facility] for [facility] Residents For 24/7 walk-up access to mental health services for [facility] children (4+), adolescents and adults, please visit the [facility] located at [address] in [place], [place].  * also provides comprehensive outpatient behavioral health services in a variety of locations around the [place].  Connect With [place] [address] [place], [place] [address] HelpLine: (734)274-0157 or 1-(906)669-9353  Get Directions  Find Help 24/7 By Phone Call our 24-hour HelpLine at 854-834-8272 or 256-374-2877 for immediate assistance for mental health and substance abuse  issues.  Walk-In Help [place]: [facility] (Ages 4 and Up) [place]: Emergency Dept., [facility] Additional Resources National Hopeline Network: 1-800-SUICIDE The National Suicide Prevention Lifeline: 1-800-273-TALK     The following coping skill education was provided for stress relief and mental health management: "When your car dies or a deadline looms, how do you respond? Long-term, low-grade or acute stress takes a serious toll on your body and mind, so don't ignore feelings of constant tension. Stress is a natural part of life. However, too much stress can harm our health, especially if it continues every day. This is chronic stress and can put you at risk for heart problems like heart disease and depression. Understand what's happening inside your body and learn simple coping skills to combat the negative impacts of everyday stressors.  Types of Stress There are two types of stress: Emotional - types of emotional stress are relationship problems, pressure at work, financial worries, experiencing discrimination or having a major life change. Physical - Examples of physical stress include being sick having pain, not sleeping well, recovery from an injury or having an alcohol and drug use disorder. Fight or Flight Sudden or ongoing stress activates your nervous system and floods your bloodstream with adrenaline and cortisol, two hormones that raise blood pressure, increase heart rate and spike blood sugar. These changes pitch your body into a fight or flight response. That enabled our ancestors to outrun saber-toothed tigers, and it's helpful today  for situations like dodging a car accident. But most modern chronic stressors, such as finances or a challenging relationship, keep your body in that heightened state, which hurts your health. Effects of Too Much Stress If constantly under stress, most of [place] will eventually start to  function less well.  Multiple studies link chronic stress to a higher risk of heart disease, stroke, depression, weight gain, memory loss and even premature death, so it's important to recognize the warning signals. Talk to your doctor about ways to manage stress if you're experiencing any of these symptoms: Prolonged periods of poor sleep. Regular, severe headaches. Unexplained weight loss or gain. Feelings of isolation, withdrawal or worthlessness. Constant anger and irritability. Loss of interest in activities. Constant worrying or obsessive thinking. Excessive alcohol or drug use. Inability to concentrate.  10 Ways to Cope with Chronic Stress It's key to recognize stressful situations as they occur because it allows you to focus on managing how you react. We all need to know when to close our eyes and take a deep breath when we feel tension rising. Use these tips to prevent or reduce chronic stress. 1. Rebalance Work and Home All work and no play? If you're spending too much time at the office, intentionally put more dates in your calendar to enjoy time for fun, either alone or with others. 2. Get Regular Exercise Moving your body on a regular basis balances the nervous system and increases blood circulation, helping to flush out stress hormones. Even a daily 20-minute walk makes a difference. Any kind of exercise can lower stress and improve your mood ? just pick activities that you enjoy and make it a regular habit. 3. Eat Well and Limit Alcohol and Stimulants Alcohol, nicotine and caffeine may temporarily relieve stress but have negative health impacts and can make stress worse in the long run. Well-nourished bodies cope better, so start with a good breakfast, add more organic fruits and vegetables for a well-balanced diet, avoid processed foods and sugar, try herbal tea and drink more water. 4. Connect with Supportive People Talking face to face with another person releases hormones  that reduce stress. Lean on those good listeners in your life. 5. Carve Out Hobby Time Do you enjoy gardening, reading, listening to music or some other creative pursuit? Engage in activities that bring you pleasure and joy; research shows that reduces stress by almost half and lowers your heart rate, too. 6. Practice Meditation, Stress Reduction or Yoga Relaxation techniques activate a state of restfulness that counterbalances your body's fight-or-flight hormones. Even if this also means a 10-minute break in a long day: listen to music, read, go for a walk in nature, do a hobby, take a bath or spend time with a friend. Also consider doing a mindfulness exercise or try a daily deep breathing or imagery practice. Deep Breathing Slow, calm and deep breathing can help you relax. Try these steps to focus on your breathing and repeat as needed. Find a comfortable position and close your eyes. Exhale and drop your shoulders. Breathe in through your nose; fill your lungs and then your belly. Think of relaxing your body, quieting your mind and becoming calm and peaceful. Breathe out slowly through your nose, relaxing your belly. Think of releasing tension, pain, worries or distress. Repeat steps three and four until you feel relaxed. Imagery This involves using your mind to excite the senses -- sound, vision, smell, taste and feeling. This may help ease your stress. Begin by getting comfortable  and then do some slow breathing. Imagine a place you love being at. It could be somewhere from your childhood, somewhere you vacationed or just a place in your imagination. Feel how it is to be in the place you're imagining. Pay attention to the sounds, air, colors, and who is there with you. This is a place where you feel cared for and loved. All is well. You are safe. Take in all the smells, sounds, tastes and feelings. As you do, feel your body being nourished and healed. Feel the calm that surrounds you. Breathe  in all the good. Breathe out any discomfort or tension. 7. Sleep Enough If you get less than seven to eight hours of sleep, your body won't tolerate stress as well as it could. If stress keeps you up at night, address the cause, and add extra meditation into your day to make up for the lost z's. Try to get seven to nine hours of sleep each night. Make a regular bedtime schedule. Keep your room dark and cool. Try to avoid computers, TV, cell phones and tablets before bed. 8. Bond with Connections You Enjoy Go out for a coffee with a friend, chat with a neighbor, call a family member, visit with a clergy member, or even hang out with your pet. Clinical studies show that spending even a short time with a companion animal can cut anxiety levels almost in half. 9. Take a Vacation Getting away from it all can reset your stress tolerance by increasing your mental and emotional outlook, which makes you a happier, more productive person upon return. Leave your cellphone and laptop at home! 10. See a Counselor, Coach or Therapist If negative thoughts overwhelm your ability to make positive changes, it's time to seek professional help. Make an appointment today--your health and life are worth it."  Dickie La, BSW, MSW, [employer] Managed Medicaid LCSW [facility]  [facility] [email or web address] Phone: [phone]

## 2023-04-15 NOTE — Patient Outreach (Signed)
Medicaid Managed Care Social Work Note  04/15/2023 Name:  Jaime Flores MRN:  [id number] DOB:  May 11, 1986  Jaime Flores is an 37 y.o. year old female who is a primary patient of [name], Elige Radon, MD.  The Medicaid Managed Care Coordination team was consulted for assistance with:  Mental Health Counseling and Resources  Jaime Flores was given information about Medicaid Managed Care Coordination team services today. Jaime Flores Patient agreed to services and verbal consent obtained.  Engaged with patient  for by telephone forfollow up visit in response to referral for case management and/or care coordination services.   Assessments/Interventions:  Review of past medical history, allergies, medications, health status, including review of consultants reports, laboratory and other test data, was performed as part of comprehensive evaluation and provision of chronic care management services.  SDOH: (Social Determinant of Health) assessments and interventions performed: SDOH Interventions    Flowsheet Row Patient Outreach Telephone from 04/15/2023 in [place] POPULATION HEALTH DEPARTMENT Patient Outreach Telephone from 03/29/2023 in [place] POPULATION HEALTH DEPARTMENT Patient Outreach Telephone from 03/28/2023 in [place] POPULATION HEALTH DEPARTMENT Patient Outreach Telephone from 03/12/2023 in [place] POPULATION HEALTH DEPARTMENT Patient Outreach Telephone from 03/01/2023 in [place] POPULATION HEALTH DEPARTMENT Patient Outreach Telephone from 02/25/2023 in [place] POPULATION HEALTH DEPARTMENT  SDOH Interventions        Transportation Interventions -- -- Intervention Not Indicated -- -- --  Utilities Interventions -- -- Intervention Not Indicated -- -- --  Alcohol Usage Interventions -- -- -- -- -- Intervention Not Indicated (Score <7)  Stress Interventions Offered [employer], Provide Counseling  [Triggers present regarding daughter] Offered [employer], Provide Counseling  [Upcoming initial therapy appointment set for this month] -- Provide Counseling, Offered [employer] Resources  [Ongoing caregiver strain, emotional support provided] [employer], Provide Counseling  [Needs therapy] --  Health Literacy Interventions -- -- -- -- -- Intervention Not Indicated       Advanced Directives Status:  See Care Plan for related entries.  Care Plan                 Allergies  Allergen Reactions   Catfish [Fish Allergy] Shortness Of Breath   Doxycycline Shortness Of Breath   Wellbutrin [Bupropion] Shortness Of Breath, Nausea Only and Palpitations   Tape Swelling    Plastic tape please use paper    Medications Reviewed Today   Medications were not reviewed in this encounter     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   Prediabetes 02/15/2020   Anxiety 11/18/2019   Depression, recurrent (HCC) 11/18/2019   Seasonal and perennial allergic rhinitis 05/15/2019   Amenorrhea 01/27/2013    Conditions to be addressed/monitored per PCP order:  Depression  Care Plan : LCSW Plan of Care  Updates made by Jaime Bryant, LCSW since 04/15/2023 12:00 AM     Problem: Depression Identification (Depression)      Goal: Depressive Symptoms Identified   Note:   Priority: High  Timeframe:  Short-Range Goal Priority:  High Start Date:   03/01/23          Expected End Date:  ongoing                     Follow Up Date 05/15/23 at 11 am  Current Barriers:  Knowledge Deficits related to plan of care for management of  Anxiety, Depression, and 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 Caregiver strain- mother of autistic child  02/25/23-decreased seizure activity per patient-seen in ED 7/25 for seizure.  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  Patient states she needs CBT and having  trouble finding therapist. Patient with increased stress and anxiety-smoking unchanged  LCSW Clinical Goal(s):  Patient will verbalize understanding of plan for management of Anxiety, Depression, and Caregiver Strain as evidenced by patient report verbalize basic understanding of  Anxiety, Depression and self health management plan as evidenced by patient report take all medications exactly as prescribed and will call provider for medication related questions as evidenced by patient report demonstrate understanding of rationale for each prescribed medication as evidenced by patient report attend all scheduled medical appointments as evidenced by patient report and EMR review demonstrate Ongoing adherence to prescribed treatment plan for Anxiety, Depression, Allergic Rhinitis, and PNES, tobacco use as evidenced by patient report and EMR review continue to work with LCSW to address care management and care coordination needs related to  Anxiety and Depression as evidenced by adherence to CM Team Scheduled appointments through collaboration with RN Care manager, provider, and care team.   Interventions: Inter-disciplinary care team collaboration (see longitudinal plan of care) Evaluation of current treatment plan related to  self management and patient's adherence to plan as established by provider Advised patient to exercise 10-20 minutes everyday, go outside in the sunlight everyday for at least 10-15 minutes, drink 6-8 glasses of water a day, take vitamins/medications as directed and try to increase sleep.  Discussed plans with patient for ongoing care management follow up and provided patient with direct contact information for care management team Assessed social determinant of health barriers  Discussed plans with patient for ongoing care management follow up and provided patient with direct contact information for care management team Evaluation of current treatment plan related to anxiety,  depression, rhinitis, PNES  and patient's adherence to plan as established by provider Reviewed medications with patient Reviewed scheduled/upcoming provider appointments  Discussed plans with patient for ongoing care management follow up and provided patient with direct contact information for care management team Assessed social determinant of health barriers Patient was educated on available mental health resources within their area that accept Medicaid and offer counseling and psychiatry. Patient is agreeable to referral to [facility] for counseling. [facility] LCSW made referral on 03/01/23.  Email sent to patient today with available mental health resources within her area that accept Medicaid and offer the services that she is interested in.  Emotional support provided. CBT intervention implemented regarding "being mentally fit" by combating negative thinking and replacing it with uplifting support, hope and positivity. Update- Patient reports that her seizures and mood management has improved with new self-care tools she has been implementing into her daily routine such as : grounding exercises, swinging, playing with animals, walking away when overstimulated, redirection, taking a quick car ride to get fresh air. She has not had a seizure in over two weeks. She shares that she is looking forward to her initial therapy appointment this month. Positive reinforcement provided for the great work she has put into her self-care over the last 30 days. Update- [facility] LCSW spoke with patient today on 04/15/23. She is VERY pleased with her new counselor at [facility] [employer]. She has successfully completed her initial therapy assessment appointment and has a follow up scheduled for 05/06/23. She  was also reminded of her upcoming PCP appointment. Patient reports that she continues to implement self-care and positive thinking habits into her daily routine even faced with triggers. Positive reinforcement  provided.   Patient Goals/Self-Care Activities: Take all medications as prescribed Attend all scheduled provider appointments Call pharmacy for medication refills 3-7 days in advance of running out of medications Perform all self care activities independently  Perform IADL's (shopping, preparing meals, housekeeping, managing finances) independently Call provider office for new concerns or questions   Follow Up Plan:  The patient has been provided with contact information for the care management team and has been advised to call with any health related questions or concerns.  The care management team will reach out to the patient again over the next 30 business  days.      Follow up:  Patient agrees to Care Plan and Follow-up.  Plan: The Managed Medicaid care management team will reach out to the patient again over the next 31 days.  Dickie La, BSW, MSW, [employer] Managed Medicaid LCSW [facility]  [facility] [email or web address] Phone: [phone]

## 2023-04-16 ENCOUNTER — Telehealth: Payer: Self-pay

## 2023-04-16 NOTE — Telephone Encounter (Signed)
Medicaid Managed Care   Unsuccessful Outreach Note  04/16/2023 Name: ELAIZA TIJERINO MRN: [id number] DOB: 1985-11-15  Referred by: [name], Elige Radon, MD Reason for referral : No chief complaint on file.   An unsuccessful telephone outreach was attempted today. The patient was referred to the case management team for assistance with care management and care coordination.   Follow Up Plan: If patient returns call to provider office, please advise to call Embedded Care Management Care Guide Nicholes Rough* at [phone]*  Nicholes Rough, CMA Care Guide [employer]

## 2023-04-24 ENCOUNTER — Ambulatory Visit: Payer: Medicaid Other | Admitting: Family Medicine

## 2023-04-29 ENCOUNTER — Other Ambulatory Visit: Payer: Medicaid Other | Admitting: Obstetrics and Gynecology

## 2023-04-29 NOTE — Patient Instructions (Signed)
Hi Jaime Flores, good to catch up today-glad you are doing okay-have a great week!  Jaime Flores was given information about Medicaid Managed Care team care coordination services as a part of their Healthy [facility] Medicaid benefit. Jaime Flores verbally consented to engagement with [facility] Managed Care team.   If you are experiencing a medical emergency, please call 911 or report to your local emergency department or urgent care.   If you have a non-emergency medical problem during routine business hours, please contact your provider's office and ask to speak with a nurse.   For questions related to your Healthy [facility] health plan, please call: [phone] or visit Jaime homepage here: [email or web address]  If you would like to schedule transportation through your Healthy [facility] plan, please call Jaime following number at least 2 days in advance of your appointment: [phone]  For information about your ride after you set it up, call Ride Assist at [phone]. Use this number to activate a Will Call pickup, or if your transportation is late for a scheduled pickup. Use this number, too, if you need to make a change or cancel a previously scheduled reservation.  If you need transportation services right away, call [phone]. [facility] after-hours call center is staffed 24 hours to handle ride assistance and urgent reservation requests (including discharges) 365 days a year. Urgent trips include sick visits, hospital discharge requests and life-sustaining treatment.  Call [facility] Line at [phone], at any time, 24 hours a day, 7 days a week. If you are in danger or need immediate medical attention call 911.  If you would like help to quit smoking, call 1-800-QUIT-NOW (2140997229) OR Espaol: 1-855-Djelo-Ya (4-742-595-6387) o para ms informacin haga clic aqu or Text READY to 564-332 to register via text  Ms.  Emelda Fear - following are Jaime goals we discussed in your visit today:   Goals Addressed    Timeframe:  Long-Range Goal Priority:  High Start Date: 08/23/22                            Expected End Date: ongoing                      Follow Up Date 06/13/23   - practice safe sex - schedule appointment for flu shot - schedule appointment for vaccines needed due to my age or health - schedule recommended health tests (blood work, mammogram, colonoscopy, pap test) - schedule and keep appointment for annual check-up    Why is this important?   Screening tests can find diseases early when they are easier to treat.  Your doctor or nurse will talk with you about which tests are important for you.  Getting shots for common diseases like Jaime flu and shingles will help prevent them.  04/29/23:  CBT continues, PCP appt 11/4  Flores verbalizes understanding of instructions and care plan provided today and agrees to view in MyChart. Active MyChart status and Flores understanding of how to access instructions and care plan via MyChart confirmed with Flores.     [facility] Managed Medicaid care management team will reach out to Jaime Flores again over Jaime next 45 business  days.  Jaime  Flores  has been provided with contact information for [facility] Managed Medicaid care management team and has been advised to call with any health related questions or concerns.   [name] RN, BSN [employer]  Network Care Management Coordinator - Managed Medicaid High Risk [phone]   Following is a copy of your plan of care:  Care Plan : RN Care Manager Plan of Care  Updates made by [name], RN since 04/29/2023 12:00 AM     Problem: Health Promotion or Disease Self-Management (General Plan of Care)      Long-Range Goal: Chronic Disease Management   Start Date: 08/23/2022  Expected End Date: 06/27/2023  Priority: High  Note:   Current Barriers:  Knowledge Deficits related to plan of care for  management of Anxiety, Depression, allergic rhinitis, and PNES, tobacco use  Chronic Disease Management support and education needs related to Anxiety, Depression, Allergic Rhinitis, and PNES, tobacco use  04/29/23:  Seen in ED 9/5.  States 2 seizures in last month.  Liking CBT.  Now working at [facility] [employer] and likes it. Smokes 1/2 ppd  RNCM Clinical Goal(s):  Flores will verbalize understanding of plan for management of Anxiety, Depression, Allergic Rhinitis, and PNES, tobacco use  as evidenced by Flores report verbalize basic understanding of  Anxiety, Depression, Allergic Rhinitis, and PNES, tobacco use  disease process and self health management plan as evidenced by Flores report take all medications exactly as prescribed and will call provider for medication related questions as evidenced by Flores report demonstrate understanding of rationale for each prescribed medication as evidenced by Flores report attend all scheduled medical appointments as evidenced by Flores report and EMR review demonstrate Ongoing adherence to prescribed treatment plan for Anxiety, Depression, Allergic Rhinitis, and PNES, tobacco use as evidenced by Flores report and EMR review continue to work with RN Care Manager to address care management and care coordination needs related to  Anxiety, Depression, Allergic Rhinitis, and PNES as evidenced by adherence to CM Team Scheduled appointments through collaboration with RN Care manager, provider, and care team.   Interventions: Inter-disciplinary care team collaboration (see longitudinal plan of care) Evaluation of current treatment plan related to  self management and Flores's adherence to plan as established by provider Collaborated with LCSW LCSW referral for CBT-completed  Smoking Cessation Interventions:  (Status:  New goal.) Long Term Goal Reviewed smoking history:  ; currently smoking 1/2 ppd On a scale of 1-10, reports MOTIVATION to quit is 1 On a scale  of 1-10, reports CONFIDENCE in quitting is 1 09/24/22:  Flores is not interested in stopping right now.  Evaluation of current treatment plan reviewed Advised Flores to discuss smoking cessation options with provider Provided contact information for [place] Quit Line (1-800-QUIT-NOW) Discussed plans with Flores for ongoing care management follow up and provided Flores with direct contact information for care management team Assessed social determinant of health barriers     (Status:  New goal.)  Long Term Goal Evaluation of current treatment plan related to Anxiety, Depression, Allergic Rhinitis, and PNES , self-management and Flores's adherence to plan as established by provider. Discussed plans with Flores for ongoing care management follow up and provided Flores with direct contact information for care management team Evaluation of current treatment plan related to anxiety, depression, rhinitis, PNES  and Flores's adherence to plan as established by provider Reviewed medications with Flores Reviewed scheduled/upcoming provider appointments  Discussed plans with Flores for ongoing care management follow up and provided Flores with direct contact information for care management team Assessed social determinant of health barriers  Flores Goals/Self-Care Activities: Take all medications as prescribed Attend all scheduled provider appointments Call pharmacy for medication refills 3-7 days in advance of  running out of medications Perform all self care activities independently  Perform IADL's (shopping, preparing meals, housekeeping, managing finances) independently Call provider office for new concerns or questions   Follow Up Plan:  Jaime Flores has been provided with contact information for [facility] care management team and has been advised to call with any health related questions or concerns.  Jaime care management team will reach out to Jaime Flores again over Jaime next 45 business  days.

## 2023-04-29 NOTE — Patient Outreach (Signed)
Medicaid Managed Care   Nurse Care Manager Note  04/29/2023 Name:  Jaime Flores MRN:  [id number] DOB:  Jan 08, 1986  Jaime Flores is an 37 y.o. year old female who is a primary patient of [name], Elige Radon, MD.  The Medicaid Managed Care Coordination team was consulted for assistance with:    Chronic healthcare management needs, tobacco use, anxiety/depression, PNES  Jaime Flores was given information about Medicaid Managed Care Coordination team services today. Jaime Flores Patient agreed to services and verbal consent obtained.  Engaged with patient by telephone for follow up visit in response to provider referral for case management and/or care coordination services.   Assessments/Interventions:  Review of past medical history, allergies, medications, health status, including review of consultants reports, laboratory and other test data, was performed as part of comprehensive evaluation and provision of chronic care management services.  SDOH (Social Determinants of Health) assessments and interventions performed: SDOH Interventions    Flowsheet Row Patient Outreach Telephone from 04/29/2023 in [place] POPULATION HEALTH DEPARTMENT Patient Outreach Telephone from 04/15/2023 in [place] POPULATION HEALTH DEPARTMENT Patient Outreach Telephone from 03/29/2023 in [place] POPULATION HEALTH DEPARTMENT Patient Outreach Telephone from 03/28/2023 in [place] POPULATION HEALTH DEPARTMENT Patient Outreach Telephone from 03/12/2023 in [place] POPULATION HEALTH DEPARTMENT Patient Outreach Telephone from 03/01/2023 in [place] POPULATION HEALTH DEPARTMENT  SDOH Interventions        Housing Interventions Intervention Not Indicated -- -- -- -- --  Transportation Interventions -- -- -- Intervention Not Indicated -- --  Utilities Interventions -- -- -- Intervention Not Indicated -- --  Stress Interventions -- [employer], Provide Counseling  [Triggers present  regarding daughter] [employer], Provide Counseling  [Upcoming initial therapy appointment set for this month] -- Provide Counseling, Offered [employer]  [Ongoing caregiver strain, emotional support provided] [employer], Provide Counseling  [employer] therapy]  Social Connections Interventions Intervention Not Indicated -- -- -- -- --     Care Plan Allergies  Allergen Reactions   Catfish [Fish Allergy] Shortness Of Breath   Doxycycline Shortness Of Breath   Wellbutrin [Bupropion] Shortness Of Breath, Nausea Only and Palpitations   Tape Swelling    Plastic tape please use paper   Medications Reviewed Today     Reviewed by [name], RN (Registered Nurse) on 04/29/23 at 1328  Med List Status: <None>   Medication Order Taking? Sig Documenting Provider Last Dose Status Informant  cyclobenzaprine (FLEXERIL) 10 MG tablet [id number]  Take 1 tablet (10 mg total) by mouth 3 (three) times daily as needed for muscle spasms. [name], Elige Radon, MD  Active   QUEtiapine Fumarate (SEROQUEL XR) 150 MG 24 hr tablet [id number]  Take 1 tablet (150 mg total) by mouth at bedtime. [name], Elige Radon, MD  Active   sertraline (ZOLOFT) 100 MG tablet [id number]  TAKE 1 AND 1/2 TABLET BY MOUTH ONCE DAILY. [name], Elige Radon, MD  Active            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   Prediabetes 02/15/2020   Anxiety 11/18/2019   Depression, recurrent (HCC) 11/18/2019   Seasonal and perennial allergic rhinitis 05/15/2019   Amenorrhea 01/27/2013   Conditions to be addressed/monitored per PCP order:  Chronic healthcare management needs, tobacco use, anxiety/depression, PNES  Care Plan : RN Care Manager Plan of Care  Updates made by [name]  G, RN since 04/29/2023 12:00 AM     Problem: Health Promotion or Disease Self-Management (General Plan of Care)       Long-Range Goal: Chronic Disease Management   Start Date: 08/23/2022  Expected End Date: 06/27/2023  Priority: High  Note:   Current Barriers:  Knowledge Deficits related to plan of care for management of Anxiety, Depression, allergic rhinitis, and PNES, tobacco use  Chronic Disease Management support and education needs related to Anxiety, Depression, Allergic Rhinitis, and PNES, tobacco use  04/29/23:  Seen in ED 9/5.  States 2 seizures in last month.  Liking CBT.  Now working at [employer] and likes it. Smokes 1/2 ppd  RNCM Clinical Goal(s):  Patient will verbalize understanding of plan for management of Anxiety, Depression, Allergic Rhinitis, and PNES, tobacco use  as evidenced by patient report verbalize basic understanding of  Anxiety, Depression, Allergic Rhinitis, and PNES, tobacco use  disease process and self health management plan as evidenced by patient report take all medications exactly as prescribed and will call provider for medication related questions as evidenced by patient report demonstrate understanding of rationale for each prescribed medication as evidenced by patient report attend all scheduled medical appointments as evidenced by patient report and EMR review demonstrate Ongoing adherence to prescribed treatment plan for Anxiety, Depression, Allergic Rhinitis, and PNES, tobacco use as evidenced by patient report and EMR review continue to work with RN Care Manager to address care management and care coordination needs related to  Anxiety, Depression, Allergic Rhinitis, and PNES as evidenced by adherence to CM Team Scheduled appointments through collaboration with RN Care manager, provider, and care team.   Interventions: Inter-disciplinary care team collaboration (see longitudinal plan of care) Evaluation of current treatment plan related to  self management and patient's adherence to plan as established by provider Collaborated with LCSW LCSW referral for  CBT-completed  Smoking Cessation Interventions:  (Status:  New goal.) Long Term Goal Reviewed smoking history:  ; currently smoking 1/2 ppd On a scale of 1-10, reports MOTIVATION to quit is 1 On a scale of 1-10, reports CONFIDENCE in quitting is 1 09/24/22:  Patient is not interested in stopping right now.  Evaluation of current treatment plan reviewed Advised patient to discuss smoking cessation options with provider Provided contact information for  Quit Line (1-800-QUIT-NOW) Discussed plans with patient for ongoing care management follow up and provided patient with direct contact information for care management team Assessed social determinant of health barriers     (Status:  New goal.)  Long Term Goal Evaluation of current treatment plan related to Anxiety, Depression, Allergic Rhinitis, and PNES , self-management and patient's adherence to plan as established by provider. Discussed plans with patient for ongoing care management follow up and provided patient with direct contact information for care management team Evaluation of current treatment plan related to anxiety, depression, rhinitis, PNES  and patient's adherence to plan as established by provider Reviewed medications with patient Reviewed scheduled/upcoming provider appointments  Discussed plans with patient for ongoing care management follow up and provided patient with direct contact information for care management team Assessed social determinant of health barriers  Patient Goals/Self-Care Activities: Take all medications as prescribed Attend all scheduled provider appointments Call pharmacy for medication refills 3-7 days in advance of running out of medications Perform all self care activities independently  Perform IADL's (shopping, preparing meals, housekeeping, managing finances) independently Call provider office for new concerns or questions   Follow Up Plan:  The patient has  been provided with contact  information for the care management team and has been advised to call with any health related questions or concerns.  The care management team will reach out to the patient again over the next 45 business  days.    Long-Range Goal: Establish Plan of Care for Chronic Disease Management Needs   Priority: High  Note:   Timeframe:  Long-Range Goal Priority:  High Start Date: 08/23/22                            Expected End Date: ongoing                      Follow Up Date 06/13/23   - practice safe sex - schedule appointment for flu shot - schedule appointment for vaccines needed due to my age or health - schedule recommended health tests (blood work, mammogram, colonoscopy, pap test) - schedule and keep appointment for annual check-up    Why is this important?   Screening tests can find diseases early when they are easier to treat.  Your doctor or nurse will talk with you about which tests are important for you.  Getting shots for common diseases like the flu and shingles will help prevent them.  04/29/23:  CBT continues, PCP appt 11/4   Follow Up:  Patient agrees to Care Plan and Follow-up.  Plan: The Managed Medicaid care management team will reach out to the patient again over the next 45 business  days. and The  Patient has been provided with contact information for the Managed Medicaid care management team and has been advised to call with any health related questions or concerns.  Date/time of next scheduled RN care management/care coordination outreach:  06/13/23 at 1230

## 2023-05-06 ENCOUNTER — Ambulatory Visit (INDEPENDENT_AMBULATORY_CARE_PROVIDER_SITE_OTHER): Payer: Medicaid Other | Admitting: Clinical

## 2023-05-06 DIAGNOSIS — F419 Anxiety disorder, unspecified: Secondary | ICD-10-CM | POA: Diagnosis not present

## 2023-05-06 DIAGNOSIS — F331 Major depressive disorder, recurrent, moderate: Secondary | ICD-10-CM | POA: Diagnosis not present

## 2023-05-06 NOTE — Progress Notes (Signed)
Virtual Visit via Video Note   I connected with Jaime Flores. Ruz on 05/06/23 at  2:00 PM EST by a video enabled telemedicine application and verified that I am speaking with the correct person using two identifiers.   Location: Patient: Home Provider: Office   I discussed the limitations of evaluation and management by telemedicine and the availability of in person appointments. The patient expressed understanding and agreed to proceed.     THERAPIST PROGRESS NOTE   Session Time: 2:00 PM- 2:45 PM   Participation Level: Active   Behavioral Response: CasualAlertDepressed   Type of Therapy: Individual Therapy   Treatment Goals addressed: Coping for Anxiety and mood management   Interventions: CBT, Motivational Interviewing, Solution Focused and Strength-based   Summary: Jaime Flores. [place] is a 37 y.o. female who presents with Depression and Anxiety . The OPT therapist worked with the patient for her OPT treatment. The OPT therapist utilized Motivational Interviewing to assist in creating therapeutic repore. The patient in the session was engaged and work in collaboration giving feedback about her past few weeks. The patient spoke about her current largest stressor in her relationship with her Mother who she lives with coupled with not currently being employed. The patient spoke about her recent verbal altercation with her Mother stemming from her mom saying that she felt the patient is not a good Mother to her kids.The patients spoke about being aware of external stressors and family stress from her Mother. The OPT therapist utilized Cognitive Behavioral Therapy through cognitive restructuring as well as worked with the patient on coping strategies to assist in management of mood. The OPT therapist worked with the patient on self check ins and utilizing her support network. The OPT therapist overviewed  coping strategies with  the patient including reviewing her awareness of exposure to  external triggers and stressor and work towards maintaining balance between impacting stressors and leisure. The patient noted she feels like session and reinforcing her own thoughts about her situation from a outside source was very helpful today. The OPT therapist competed a mood scale evaluation. The patient overviewed her upcoming health appointments as listed in patients MyChart.   Suicidal/Homicidal: Nowithout intent/plan   Therapist Response: The OPT therapist worked with the patient for the patients scheduled session. The patient was engaged in her session and gave feedback in relation to triggers, symptoms, and behavior responses over the past few weeks. The patient spoke about in home dynamics being a adult living with her Mother and parenting kids one of which is special needs. The patient spoke about her interactions with her Mother being the biggest trigger for her stress seizures and that currently not having a job or income is a current stressor. The OPT therapist worked with the patient utilizing an in session Cognitive Behavioral Therapy exercise. The patient was responsive in the session and verbalized, ".The last verbal altercation came from my mom saying she thought I was a bad Mother to my kids ". The OPT therapist worked with the patient on herself fact checking and not allowing others to negatively impact her functioning. The OPT gave support and encouragement with the patient who will work on using coping strategies with consistency and being aware of her baseline and working on ongoing consistency in meeting her treatment goals with consistency. The patient spoke about looking forward to upcoming holidays. The OPT therapist will continue treatment work with the patient in her next scheduled session.   Plan: Return again in 2/3 weeks.  Diagnosis:      Axis I:  Recurrent moderate MDD with Anxiety                         Axis II: No diagnosis   Collaboration of Care: None    Patient/Guardian was advised Release of Information must be obtained prior to any record release in order to collaborate their care with an outside provider. Patient/Guardian was advised if they have not already done so to contact the registration department to sign all necessary forms in order for [place] to release information regarding their care.    Consent: Patient/Guardian gives verbal consent for treatment and assignment of benefits for services provided during this visit. Patient/Guardian expressed understanding and agreed to proceed.        I discussed the assessment and treatment plan with the patient. The patient was provided an opportunity to ask questions and all were answered. The patient agreed with the plan and demonstrated an understanding of the instructions.   The patient was advised to call back or seek an in-person evaluation if the symptoms worsen or if the condition fails to improve as anticipated.   I provided 45 minutes of non-face-to-face time during this encounter.   Suzan Garibaldi, LCSW   05/06/2023

## 2023-05-16 ENCOUNTER — Other Ambulatory Visit: Payer: Self-pay | Admitting: Licensed Clinical Social Worker

## 2023-05-16 NOTE — Patient Instructions (Signed)
Visit Information  Jaime Flores was given information about Medicaid Managed Care team care coordination services as a part of their Healthy [facility] Medicaid benefit. Jaime Flores verbally consented to engagement with the [facility] Managed Care team.   If you are experiencing a medical emergency, please call 911 or report to your local emergency department or urgent care.   If you have a non-emergency medical problem during routine business hours, please contact your provider's office and ask to speak with a nurse.   For questions related to your Healthy [facility] health plan, please call: [phone] or visit the homepage here: [email or web address]  If you would like to schedule transportation through your Healthy [facility] plan, please call the following number at least 2 days in advance of your appointment: [phone]  For information about your ride after you set it up, call Ride Assist at [phone]. Use this number to activate a Will Call pickup, or if your transportation is late for a scheduled pickup. Use this number, too, if you need to make a change or cancel a previously scheduled reservation.  If you need transportation services right away, call [phone]. The after-hours call center is staffed 24 hours to handle ride assistance and urgent reservation requests (including discharges) 365 days a year. Urgent trips include sick visits, hospital discharge requests and life-sustaining treatment.  Call the [facility] Line at [phone], at any time, 24 hours a day, 7 days a week. If you are in danger or need immediate medical attention call 911.  If you would like help to quit smoking, call 1-800-QUIT-NOW ((778)203-3961) OR Espaol: 1-855-Djelo-Ya (7-371-062-6948) o para ms informacin haga clic aqu or Text READY to 546-270 to register via text   Following is a copy of your plan of care:  Care Plan : LCSW Plan of Care   Updates made by Jaime Bryant, LCSW since 05/16/2023 12:00 AM     Problem: Depression Identification (Depression)      Goal: Depressive Symptoms Identified   Note:   Priority: High  Timeframe:  Short-Range Goal Priority:  High Start Date:   03/01/23          Expected End Date:  ongoing                     No follow up date as patient has now been successfully established with a long term therapist that she has built great rapport.  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 Caregiver strain- mother of autistic child  02/25/23-decreased seizure activity per patient-seen in ED 7/25 for seizure.  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  Patient states she needs CBT and having trouble finding therapist. Patient with increased stress and anxiety-smoking unchanged  LCSW Clinical Goal(s):  Patient will verbalize understanding of plan for management of Anxiety, Depression, and Caregiver Strain as evidenced by patient report verbalize basic understanding of  Anxiety, Depression and self health management plan as evidenced by patient report take all medications exactly as prescribed and will call provider for medication related questions as evidenced by patient report demonstrate understanding of rationale for each prescribed medication as evidenced by patient report attend all scheduled medical appointments as evidenced by patient report and EMR review demonstrate Ongoing adherence to prescribed treatment plan for Anxiety, Depression, Allergic Rhinitis, and PNES, tobacco use as evidenced by  patient report and EMR review continue to work with LCSW to address care management and care coordination needs related to  Anxiety and Depression as evidenced by adherence to CM Team Scheduled appointments through collaboration with RN Care manager,  provider, and care team.   Patient Goals/Self-Care Activities: Take all medications as prescribed Attend all scheduled provider appointments Call pharmacy for medication refills 3-7 days in advance of running out of medications Perform all self care activities independently  Perform IADL's (shopping, preparing meals, housekeeping, managing finances) independently Call provider office for new concerns or questions   Follow Up Plan:  The patient has been provided with contact information for the care management team and has been advised to call with any health related questions or concerns.  The care management team will reach out to the patient again over the next 30 business  days.          24- Hour Availability:    [facility]  [address] [place], [place] [phone] Crisis [phone]   [facility] [phone]  [place] [facility]  [phone]    [facility]  [phone] (after hours)   Therapeutic Alternative/Mobile Crisis   [phone]   [place] National Suicide Hotline  803-236-1310 Len Child[place]) [place] [address]   Call [phone]) [phone] for mental health emergencies   [facility]  [phone]);  [place] and [employer]  [phone]); [place], [place], [place], [place], [place], [place], [place]    [facility] for [facility] Residents For 24/7 walk-up access to mental health services for [facility] children (4+), adolescents and adults, please visit the [facility] located at [address] in [place], [place].  *[facility] also provides comprehensive outpatient behavioral health services in a variety of locations around [place].  Connect With [place] [address] [place], [place] [address] HelpLine: (201)271-9407 or 1-219-498-8763  Get Directions  Find Help 24/7 By Phone Call our 24-hour HelpLine  at 785-514-6319 or 7097749724 for immediate assistance for mental health and substance abuse issues.  Walk-In Help [place]: [facility] (Ages 4 and Up) [place]: Emergency Dept., [facility] Additional Resources National Hopeline Network: 1-800-SUICIDE The National Suicide Prevention Lifeline: 1-800-273-TALK     The following coping skill education was provided for stress relief and mental health management: "When your car dies or a deadline looms, how do you respond? Long-term, low-grade or acute stress takes a serious toll on your body and mind, so don't ignore feelings of constant tension. Stress is a natural part of life. However, too much stress can harm our health, especially if it continues every day. This is chronic stress and can put you at risk for heart problems like heart disease and depression. Understand what's happening inside your body and learn simple coping skills to combat the negative impacts of everyday stressors.  Types of Stress There are two types of stress: Emotional - types of emotional stress are relationship problems, pressure at work, financial worries, experiencing discrimination or having a major life change. Physical - Examples of physical stress include being sick having pain, not sleeping well, recovery from an injury or having an alcohol and drug use disorder. Fight or Flight Sudden or ongoing stress activates your nervous system and floods your bloodstream with adrenaline and cortisol, two hormones that raise blood pressure, increase heart rate and spike blood sugar. These changes pitch  your body into a fight or flight response. That enabled our ancestors to outrun saber-toothed tigers, and it's helpful today for situations like dodging a car accident. But most modern chronic stressors, such as finances or a challenging relationship, keep your body in that heightened state, which hurts your  health. Effects of Too Much Stress If constantly under stress, most of [place] will eventually start to function less well.  Multiple studies link chronic stress to a higher risk of heart disease, stroke, depression, weight gain, memory loss and even premature death, so it's important to recognize the warning signals. Talk to your doctor about ways to manage stress if you're experiencing any of these symptoms: Prolonged periods of poor sleep. Regular, severe headaches. Unexplained weight loss or gain. Feelings of isolation, withdrawal or worthlessness. Constant anger and irritability. Loss of interest in activities. Constant worrying or obsessive thinking. Excessive alcohol or drug use. Inability to concentrate.  10 Ways to Cope with Chronic Stress It's key to recognize stressful situations as they occur because it allows you to focus on managing how you react. We all need to know when to close our eyes and take a deep breath when we feel tension rising. Use these tips to prevent or reduce chronic stress. 1. Rebalance Work and Home All work and no play? If you're spending too much time at the office, intentionally put more dates in your calendar to enjoy time for fun, either alone or with others. 2. Get Regular Exercise Moving your body on a regular basis balances the nervous system and increases blood circulation, helping to flush out stress hormones. Even a daily 20-minute walk makes a difference. Any kind of exercise can lower stress and improve your mood ? just pick activities that you enjoy and make it a regular habit. 3. Eat Well and Limit Alcohol and Stimulants Alcohol, nicotine and caffeine may temporarily relieve stress but have negative health impacts and can make stress worse in the long run. Well-nourished bodies cope better, so start with a good breakfast, add more organic fruits and vegetables for a well-balanced diet, avoid processed foods and sugar, try herbal tea and drink more  water. 4. Connect with Supportive People Talking face to face with another person releases hormones that reduce stress. Lean on those good listeners in your life. 5. Carve Out Hobby Time Do you enjoy gardening, reading, listening to music or some other creative pursuit? Engage in activities that bring you pleasure and joy; research shows that reduces stress by almost half and lowers your heart rate, too. 6. Practice Meditation, Stress Reduction or Yoga Relaxation techniques activate a state of restfulness that counterbalances your body's fight-or-flight hormones. Even if this also means a 10-minute break in a long day: listen to music, read, go for a walk in nature, do a hobby, take a bath or spend time with a friend. Also consider doing a mindfulness exercise or try a daily deep breathing or imagery practice. Deep Breathing Slow, calm and deep breathing can help you relax. Try these steps to focus on your breathing and repeat as needed. Find a comfortable position and close your eyes. Exhale and drop your shoulders. Breathe in through your nose; fill your lungs and then your belly. Think of relaxing your body, quieting your mind and becoming calm and peaceful. Breathe out slowly through your nose, relaxing your belly. Think of releasing tension, pain, worries or distress. Repeat steps three and four until you feel relaxed. Imagery This involves using your mind to  excite the senses -- sound, vision, smell, taste and feeling. This may help ease your stress. Begin by getting comfortable and then do some slow breathing. Imagine a place you love being at. It could be somewhere from your childhood, somewhere you vacationed or just a place in your imagination. Feel how it is to be in the place you're imagining. Pay attention to the sounds, air, colors, and who is there with you. This is a place where you feel cared for and loved. All is well. You are safe. Take in all the smells, sounds, tastes and  feelings. As you do, feel your body being nourished and healed. Feel the calm that surrounds you. Breathe in all the good. Breathe out any discomfort or tension. 7. Sleep Enough If you get less than seven to eight hours of sleep, your body won't tolerate stress as well as it could. If stress keeps you up at night, address the cause, and add extra meditation into your day to make up for the lost z's. Try to get seven to nine hours of sleep each night. Make a regular bedtime schedule. Keep your room dark and cool. Try to avoid computers, TV, cell phones and tablets before bed. 8. Bond with Connections You Enjoy Go out for a coffee with a friend, chat with a neighbor, call a family member, visit with a clergy member, or even hang out with your pet. Clinical studies show that spending even a short time with a companion animal can cut anxiety levels almost in half. 9. Take a Vacation Getting away from it all can reset your stress tolerance by increasing your mental and emotional outlook, which makes you a happier, more productive person upon return. Leave your cellphone and laptop at home! 10. See a Counselor, Coach or Therapist If negative thoughts overwhelm your ability to make positive changes, it's time to seek professional help. Make an appointment today--your health and life are worth it."  Dickie La, BSW, MSW, [employer] Managed Medicaid LCSW [facility]  [facility] [email or web address] Phone: [phone]

## 2023-05-16 NOTE — Patient Outreach (Signed)
Medicaid Managed Care Social Work Note  05/16/2023 Name:  Jaime Flores MRN:  [id number] DOB:  Aug 30, 1985  Jaime Flores is an 37 y.o. year old female who is a primary patient of [name], Elige Radon, MD.  The Medicaid Managed Care Coordination team was consulted for assistance with:  Mental Health Counseling and Resources  Jaime Flores was given information about Medicaid Managed Care Coordination team services today. Jaime Flores Patient agreed to services and verbal consent obtained.  Engaged with patient  for by telephone for discharge visit  in response to referral for case management and/or care coordination services.   Assessments/Interventions:  Review of past medical history, allergies, medications, health status, including review of consultants reports, laboratory and other test data, was performed as part of comprehensive evaluation and provision of chronic care management services.  SDOH: (Social Determinant of Health) assessments and interventions performed: SDOH Interventions    Flowsheet Row Patient Outreach Telephone from 05/16/2023 in [place] POPULATION HEALTH DEPARTMENT Patient Outreach Telephone from 04/29/2023 in [place] POPULATION HEALTH DEPARTMENT Patient Outreach Telephone from 04/15/2023 in [place] POPULATION HEALTH DEPARTMENT Patient Outreach Telephone from 03/29/2023 in [place] POPULATION HEALTH DEPARTMENT Patient Outreach Telephone from 03/28/2023 in [place] POPULATION HEALTH DEPARTMENT Patient Outreach Telephone from 03/12/2023 in [place] POPULATION HEALTH DEPARTMENT  SDOH Interventions        Housing Interventions -- Intervention Not Indicated -- -- -- --  Transportation Interventions -- -- -- -- Intervention Not Indicated --  Utilities Interventions -- -- -- -- Intervention Not Indicated --  Stress Interventions Offered [employer], Provide Counseling  [Recent seizures] -- Offered [employer], Provide  Counseling  [Triggers present regarding daughter] [employer], Provide Counseling  [Upcoming initial therapy appointment set for this month] -- Provide Counseling, Offered [employer]  [Ongoing caregiver strain, emotional support provided]  Social Connections Interventions -- Intervention Not Indicated -- -- -- --       Advanced Directives Status:  See Care Plan for related entries.  Care Plan                 Allergies  Allergen Reactions   Catfish [Fish Allergy] Shortness Of Breath   Doxycycline Shortness Of Breath   Wellbutrin [Bupropion] Shortness Of Breath, Nausea Only and Palpitations   Tape Swelling    Plastic tape please use paper    Medications Reviewed Today   Medications were not reviewed in this encounter     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   Prediabetes 02/15/2020   Anxiety 11/18/2019   Depression, recurrent (HCC) 11/18/2019   Seasonal and perennial allergic rhinitis 05/15/2019   Amenorrhea 01/27/2013    Conditions to be addressed/monitored per PCP order:  Depression  Care Plan : LCSW Plan of Care  Updates made by Gustavus Bryant, LCSW since 05/16/2023 12:00 AM     Problem: Depression Identification (Depression)      Goal: Depressive Symptoms Identified   Note:   Priority: High  Timeframe:  Short-Range Goal Priority:  High Start Date:   03/01/23          Expected End Date:  ongoing                     No follow up date as patient has now been successfully established with a long term therapist that she has built great rapport.  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 Caregiver strain- mother of autistic child  02/25/23-decreased seizure activity per patient-seen in ED 7/25 for seizure.  Patient has f/u appt Friday with  PCP-had to resign job at [employer] d/t PNES.  Patient states she needs CBT and having trouble finding therapist. Patient with increased stress and anxiety-smoking unchanged  LCSW Clinical Goal(s):  Patient will verbalize understanding of plan for management of Anxiety, Depression, and Caregiver Strain as evidenced by patient report verbalize basic understanding of  Anxiety, Depression and self health management plan as evidenced by patient report take all medications exactly as prescribed and will call provider for medication related questions as evidenced by patient report demonstrate understanding of rationale for each prescribed medication as evidenced by patient report attend all scheduled medical appointments as evidenced by patient report and EMR review demonstrate Ongoing adherence to prescribed treatment plan for Anxiety, Depression, Allergic Rhinitis, and PNES, tobacco use as evidenced by patient report and EMR review continue to work with LCSW to address care management and care coordination needs related to  Anxiety and Depression as evidenced by adherence to CM Team Scheduled appointments through collaboration with RN Care manager, provider, and care team.   Interventions: Inter-disciplinary care team collaboration (see longitudinal plan of care) Evaluation of current treatment plan related to  self management and patient's adherence to plan as established by provider Advised patient to exercise 10-20 minutes everyday, go outside in the sunlight everyday for at least 10-15 minutes, drink 6-8 glasses of water a day, take vitamins/medications as directed and try to increase sleep.  Discussed plans with patient for ongoing care management follow up and provided patient with direct contact information for care management team Assessed social determinant of health barriers  Discussed plans with patient for ongoing care management follow up and provided patient with direct contact information  for care management team Evaluation of current treatment plan related to anxiety, depression, rhinitis, PNES  and patient's adherence to plan as established by provider Reviewed medications with patient Reviewed scheduled/upcoming provider appointments  Discussed plans with patient for ongoing care management follow up and provided patient with direct contact information for care management team Assessed social determinant of health barriers Patient was educated on available mental health resources within their area that accept Medicaid and offer counseling and psychiatry. Patient is agreeable to referral to [facility] for counseling. [facility] LCSW made referral on 03/01/23.  Email sent to patient today with available mental health resources within her area that accept Medicaid and offer the services that she is interested in.  Emotional support provided. CBT intervention implemented regarding "being mentally fit" by combating negative thinking and replacing it with uplifting support, hope and positivity. Update- Patient reports that her seizures and mood management has improved with new self-care tools she has been implementing into her daily routine such as : grounding exercises, swinging, playing with animals, walking away when overstimulated, redirection, taking a quick car ride to get fresh air. She has not had a seizure in over two weeks. She shares that she is looking forward to her initial therapy appointment this month. Positive reinforcement provided for the great work she has put into her self-care over the last 30 days. Update- [facility] LCSW spoke with patient today on 04/15/23. She is VERY pleased with her new counselor at [facility]. She has successfully completed her  initial therapy assessment appointment and has a follow up scheduled for 05/06/23. She was also reminded of her upcoming PCP appointment. Patient reports that she continues to implement self-care and positive  thinking habits into her daily routine even faced with triggers. Positive reinforcement provided. Discharge visit completed on 05/16/23- Patient is agreeable to case closure at this time due to being successfully established with a long term therapist at [facility] [place]. Patient was provided extensive education on healthy self-care to promote wellness and healing as she has been experiencing an increase in seizures due to lack of sleep and stress. However, overall patient is doing very well, an improvement in mood and reports being stable. [facility] LCSW also educated patient on how to put in place healthy boundaries with family. [facility] LCSW updated [facility] RNCM on case closure.   Patient Goals/Self-Care Activities: Take all medications as prescribed Attend all scheduled provider appointments Call pharmacy for medication refills 3-7 days in advance of running out of medications Perform all self care activities independently  Perform IADL's (shopping, preparing meals, housekeeping, managing finances) independently Call provider office for new concerns or questions   Follow Up Plan:  The patient has been provided with contact information for the care management team and has been advised to call with any health related questions or concerns.  The care management team will reach out to the patient again over the next 30 business  days.      Follow up:  Patient requests no follow-up at this time.  Dickie La, BSW, MSW, [employer] Managed Medicaid LCSW [facility]  [facility] [email or web address] Phone: [phone]

## 2023-05-27 ENCOUNTER — Encounter: Payer: Self-pay | Admitting: Family Medicine

## 2023-05-27 ENCOUNTER — Telehealth: Payer: Self-pay | Admitting: Family Medicine

## 2023-05-27 ENCOUNTER — Ambulatory Visit: Payer: Medicaid Other | Admitting: Family Medicine

## 2023-05-27 NOTE — Telephone Encounter (Signed)
Appt r/s to 11/18 at 3:55pm

## 2023-05-28 ENCOUNTER — Ambulatory Visit (INDEPENDENT_AMBULATORY_CARE_PROVIDER_SITE_OTHER): Payer: Medicaid Other | Admitting: Clinical

## 2023-05-28 DIAGNOSIS — F331 Major depressive disorder, recurrent, moderate: Secondary | ICD-10-CM | POA: Diagnosis not present

## 2023-05-28 DIAGNOSIS — F419 Anxiety disorder, unspecified: Secondary | ICD-10-CM | POA: Diagnosis not present

## 2023-05-28 NOTE — Progress Notes (Signed)
Virtual Visit via Video Note   I connected with Jaime Flores. Wetherell on 05/28/23 at  1:00 PM EST by a video enabled telemedicine application and verified that I am speaking with the correct person using two identifiers.   Location: Patient: Home Provider: Office   I discussed the limitations of evaluation and management by telemedicine and the availability of in person appointments. The patient expressed understanding and agreed to proceed.     THERAPIST PROGRESS NOTE   Session Time: 1:00 PM- 1:55 PM   Participation Level: Active   Behavioral Response: CasualAlertDepressed   Type of Therapy: Individual Therapy   Treatment Goals addressed: Coping for Anxiety and mood management   Interventions: CBT, Motivational Interviewing, Solution Focused and Strength-based   Summary: Jaime Flores. Jaime Flores is a 37 y.o. female who presents with Depression and Anxiety . The OPT therapist worked with the patient for her OPT treatment. The OPT therapist utilized Motivational Interviewing to assist in creating therapeutic repore. The patient in the session was engaged and work in collaboration giving feedback about her past few weeks. The patient spoke about her stressors and ongoing work in her relationship with her Mother who she lives with coupled with not currently being employed, however, noted she has been applying a lot since her last session. The OPT therapist utilized Cognitive Behavioral Therapy through cognitive restructuring as well as worked with the patient on coping strategies to assist in management of mood. The OPT therapist worked with the patient on self check ins and utilizing her support network. The OPT therapist overviewed  coping strategies for season change with the patient including reviewing her awareness of exposure to light and being active. The OPT therapist competed a mood scale evaluation. The patient overviewed her upcoming health appointments as listed in patients MyChart.    Suicidal/Homicidal: Nowithout intent/plan   Therapist Response: The OPT therapist worked with the patient for the patients scheduled session. The patient was engaged in her session and gave feedback in relation to triggers, symptoms, and behavior responses over the past few weeks. The patient spoke about in home dynamics being a adult living with her Mother and parenting kids one of which is special needs, and impact of not having income. The patient spoke about her interactions with her Mother and communication/work to improve their relationship. The OPT therapist worked with the patient utilizing an in session Cognitive Behavioral Therapy exercise. The patient was responsive in the session and verbalized, ".I did get a chance to talk with my Mom and I think that helped and she was receptive ". The OPT therapist worked with the patient on herself fact checking and not allowing others to negatively impact her functioning. The OPT gave support and encouragement with the patient who will work on using coping strategies with consistency and being aware of her baseline and working on ongoing consistency in meeting her treatment goals with consistency. The patient spoke about looking forward to upcoming holidays and her ongoing work to find employment. The OPT therapist will continue treatment work with the patient in her next scheduled session.   Plan: Return again in 2/3 weeks.   Diagnosis:      Axis I:  Recurrent moderate MDD with Anxiety                         Axis II: No diagnosis   Collaboration of Care: None   Patient/Guardian was advised Release of Information must be obtained prior to any  record release in order to collaborate their care with an outside provider. Patient/Guardian was advised if they have not already done so to contact the registration department to sign all necessary forms in order for [place] to release information regarding their care.    Consent: Patient/Guardian gives verbal  consent for treatment and assignment of benefits for services provided during this visit. Patient/Guardian expressed understanding and agreed to proceed.        I discussed the assessment and treatment plan with the patient. The patient was provided an opportunity to ask questions and all were answered. The patient agreed with the plan and demonstrated an understanding of the instructions.   The patient was advised to call back or seek an in-person evaluation if the symptoms worsen or if the condition fails to improve as anticipated.   I provided 55 minutes of non-face-to-face time during this encounter.   Suzan Garibaldi, LCSW   05/28/2023

## 2023-06-05 ENCOUNTER — Other Ambulatory Visit: Payer: Self-pay | Admitting: Family Medicine

## 2023-06-05 DIAGNOSIS — F419 Anxiety disorder, unspecified: Secondary | ICD-10-CM

## 2023-06-05 DIAGNOSIS — F339 Major depressive disorder, recurrent, unspecified: Secondary | ICD-10-CM

## 2023-06-06 MED ORDER — SERTRALINE HCL 100 MG PO TABS
ORAL_TABLET | ORAL | 0 refills | Status: DC
Start: 2023-06-06 — End: 2023-07-05

## 2023-06-10 ENCOUNTER — Ambulatory Visit (INDEPENDENT_AMBULATORY_CARE_PROVIDER_SITE_OTHER): Payer: Medicaid Other | Admitting: Family Medicine

## 2023-06-10 ENCOUNTER — Encounter: Payer: Self-pay | Admitting: Family Medicine

## 2023-06-10 VITALS — BP 112/76 | HR 74 | Ht 62.0 in | Wt 158.0 lb

## 2023-06-10 DIAGNOSIS — L2081 Atopic neurodermatitis: Secondary | ICD-10-CM | POA: Diagnosis not present

## 2023-06-10 DIAGNOSIS — F3181 Bipolar II disorder: Secondary | ICD-10-CM

## 2023-06-10 DIAGNOSIS — F419 Anxiety disorder, unspecified: Secondary | ICD-10-CM

## 2023-06-10 DIAGNOSIS — F339 Major depressive disorder, recurrent, unspecified: Secondary | ICD-10-CM

## 2023-06-10 MED ORDER — PERMETHRIN 5 % EX CREA
1.0000 | TOPICAL_CREAM | Freq: Once | CUTANEOUS | 0 refills | Status: AC
Start: 2023-06-10 — End: 2023-06-10

## 2023-06-10 MED ORDER — PREDNISONE 20 MG PO TABS
ORAL_TABLET | ORAL | 0 refills | Status: DC
Start: 2023-06-10 — End: 2023-09-02

## 2023-06-10 MED ORDER — LAMOTRIGINE 25 MG PO TABS
25.0000 mg | ORAL_TABLET | Freq: Every day | ORAL | 1 refills | Status: DC
Start: 2023-06-10 — End: 2023-07-29

## 2023-06-10 NOTE — Progress Notes (Signed)
BP 112/76   Pulse 74   Ht 5\' 2"  (1.575 m)   Wt 158 lb (71.7 kg)   SpO2 97%   BMI 28.90 kg/m    Subjective:   Patient ID: Jaime Flores, female    DOB: 05/21/1986, 37 y.o.   MRN: [id number]  HPI: Jaime Flores is a 37 y.o. female presenting on 06/10/2023 for Medical Management of Chronic Issues, Anxiety, and Rash (Upper body)   HPI Anxiety and depression Patient is coming in for anxiety and depression and has a history of pseudoseizures and is likely bipolar 2 disorder based on history.  Patient denies any suicidal ideations.  Send anxiety is not been under control, she was on the Seroquel for a little bit but felt like she was sleeping too much and it was making her too sedated.  She has been taking the Zoloft still.  She just feels like her stress level and anxiety has been up a lot.  She had moved back in with her mother and her relationship with her husband is not going well and her husband is about to lose his children completely to foster care without any visits.  Rash Patient has a rash on her upper extremity and torso basically from her waist up to her chest and on both upper arms.  The rash is multiple very pruritic papular lesions that have been there for 6 months she says.  Relevant past medical, surgical, family and social history reviewed and updated as indicated. Interim medical history since our last visit reviewed. Allergies and medications reviewed and updated.  Review of Systems  Constitutional:  Negative for chills and fever.  HENT:  Negative for congestion, ear discharge and ear pain.   Eyes:  Negative for redness and visual disturbance.  Respiratory:  Negative for chest tightness and shortness of breath.   Cardiovascular:  Negative for chest pain and leg swelling.  Genitourinary:  Negative for difficulty urinating and dysuria.  Musculoskeletal:  Negative for back pain and gait problem.  Skin:  Positive for rash. Negative for color change and wound.   Neurological:  Negative for light-headedness and headaches.  Psychiatric/Behavioral:  Positive for dysphoric mood. Negative for agitation, behavioral problems, self-injury, sleep disturbance and suicidal ideas. The patient is nervous/anxious.   All other systems reviewed and are negative.   Per HPI unless specifically indicated above   Allergies as of 06/10/2023       Reactions   Catfish [fish Allergy] Shortness Of Breath   Doxycycline Shortness Of Breath   Wellbutrin [bupropion] Shortness Of Breath, Nausea Only, Palpitations   Tape Swelling   Plastic tape please use paper        Medication List        Accurate as of June 10, 2023  4:45 PM. If you have any questions, ask your nurse or doctor.          STOP taking these medications    QUEtiapine Fumarate 150 MG 24 hr tablet Commonly known as: SEROquel XR Stopped by: Elige Radon Pinchos Topel       TAKE these medications    cyclobenzaprine 10 MG tablet Commonly known as: FLEXERIL Take 1 tablet (10 mg total) by mouth 3 (three) times daily as needed for muscle spasms.   lamoTRIgine 25 MG tablet Commonly known as: LaMICtal Take 1 tablet (25 mg total) by mouth daily. Started by: Elige Radon Armondo Cech   permethrin 5 % cream Commonly known as: ELIMITE Apply 1 Application topically once for  1 dose. Started by: Elige Radon Jaymee Tilson   predniSONE 20 MG tablet Commonly known as: DELTASONE 2 po at same time daily for 5 days Started by: Elige Radon Regan Llorente   sertraline 100 MG tablet Commonly known as: ZOLOFT TAKE 1 AND 1/2 TABLET BY MOUTH ONCE DAILY.         Objective:   BP 112/76   Pulse 74   Ht 5\' 2"  (1.575 m)   Wt 158 lb (71.7 kg)   SpO2 97%   BMI 28.90 kg/m   Wt Readings from Last 3 Encounters:  06/10/23 158 lb (71.7 kg)  02/14/23 156 lb (70.8 kg)  02/06/23 156 lb (70.8 kg)    Physical Exam Vitals and nursing note reviewed.  Constitutional:      General: She is not in acute distress.    Appearance:  She is well-developed. She is not diaphoretic.  Eyes:     Conjunctiva/sclera: Conjunctivae normal.  Cardiovascular:     Rate and Rhythm: Normal rate and regular rhythm.     Heart sounds: Normal heart sounds. No murmur heard. Pulmonary:     Effort: Pulmonary effort is normal. No respiratory distress.     Breath sounds: Normal breath sounds. No wheezing.  Musculoskeletal:        General: No tenderness. Normal range of motion.  Skin:    General: Skin is warm and dry.     Findings: Rash (Pink papular rash pending and covering much of her upper torso from her waistline up to her upper arms and lower neck.  None on face or hands or genitalia.) present.  Neurological:     Mental Status: She is alert and oriented to person, place, and time.     Coordination: Coordination normal.  Psychiatric:        Behavior: Behavior normal.       Assessment & Plan:   Problem List Items Addressed This Visit       Other   Anxiety - Primary   Relevant Medications   lamoTRIgine (LAMICTAL) 25 MG tablet   Depression, recurrent (HCC)   Relevant Medications   lamoTRIgine (LAMICTAL) 25 MG tablet   Other Visit Diagnoses     Bipolar 2 disorder (HCC)       Atopic neurodermatitis       Relevant Medications   predniSONE (DELTASONE) 20 MG tablet   permethrin (ELIMITE) 5 % cream       Will treat with short course of prednisone and also give permethrin cream in case scabies or fleas are involved.  Will try Lamictal along with her sertraline. Follow up plan: Return in about 4 weeks (around 07/08/2023), or if symptoms worsen or fail to improve, for Anxiety and depression recheck.  Counseling provided for all of the vaccine components No orders of the defined types were placed in this encounter.   Arville Care, MD [facility] 06/10/2023, 4:45 PM

## 2023-06-13 ENCOUNTER — Other Ambulatory Visit: Payer: Self-pay | Admitting: Obstetrics and Gynecology

## 2023-06-13 NOTE — Patient Instructions (Signed)
Hi Jaime Flores, I am so glad you are doing well!  Have a nice rest of the day!  Jaime Flores was given information about Medicaid Managed Care team care coordination services as a part of their Healthy [facility] Medicaid benefit. Jaime Flores verbally consented to engagement with the [facility] Managed Care team.   If you are experiencing a medical emergency, please call 911 or report to your local emergency department or urgent care.   If you have a non-emergency medical problem during routine business hours, please contact your provider's office and ask to speak with a nurse.   For questions related to your Healthy [facility] health plan, please call: [phone] or visit the homepage here: [email or web address]  If you would like to schedule transportation through your Healthy [facility] plan, please call the following number at least 2 days in advance of your appointment: [phone]  For information about your ride after you set it up, call Ride Assist at [phone]. Use this number to activate a Will Call pickup, or if your transportation is late for a scheduled pickup. Use this number, too, if you need to make a change or cancel a previously scheduled reservation.  If you need transportation services right away, call [phone]. The after-hours call center is staffed 24 hours to handle ride assistance and urgent reservation requests (including discharges) 365 days a year. Urgent trips include sick visits, hospital discharge requests and life-sustaining treatment.  Call the [facility] Line at [phone], at any time, 24 hours a day, 7 days a week. If you are in danger or need immediate medical attention call 911.  If you would like help to quit smoking, call 1-800-QUIT-NOW (956-211-6234) OR Espaol: 1-855-Djelo-Ya (7-564-332-9518) o para ms informacin haga clic aqu or Text READY to 841-660 to register via text  Jaime Flores -  following are the goals we discussed in your visit today:   Goals Addressed    Timeframe:  Long-Range Goal Priority:  High Start Date: 08/23/22                            Expected End Date: ongoing                      Follow Up Date 07/11/23   - practice safe sex - schedule appointment for flu shot - schedule appointment for vaccines needed due to my age or health - schedule recommended health tests (blood work, mammogram, colonoscopy, pap test) - schedule and keep appointment for annual check-up    Why is this important?   Screening tests can find diseases early when they are easier to treat.  Your doctor or nurse will talk with you about which tests are important for you.  Getting shots for common diseases like the flu and shingles will help prevent them.  06/13/23:  CBT continues monthly, virtual.  PCP 12/12  Patient verbalizes understanding of instructions and care plan provided today and agrees to view in MyChart. Active MyChart status and patient understanding of how to access instructions and care plan via MyChart confirmed with patient.     The Managed Medicaid care management team will reach out to the patient again over the next 30 business  days.  The  Patient has been provided with contact information for the Managed Medicaid care management team and has been advised to call with any health related questions or concerns.   [name] RN, BSN  [place] Care Management Coordinator - Managed [place] High Risk [phone]   Following is a copy of your plan of care:  Care Plan : RN Care Manager Plan of Care  Updates made by [name], RN since 06/13/2023 12:00 AM     Problem: Health Promotion or Disease Self-Management (General Plan of Care)      Long-Range Goal: Chronic Disease Management   Start Date: 08/23/2022  Expected End Date: 06/27/2023  Priority: High  Note:   Current Barriers:  Knowledge Deficits related to plan of care  for management of Anxiety, Depression, allergic rhinitis, and PNES, tobacco use  Chronic Disease Management support and education needs related to Anxiety, Depression, Allergic Rhinitis, and PNES, tobacco use  06/13/23: 4 seizures in last month-improving with help of monthly CBT-virtual.  Had job interview today and is hopeful for new job.  Smokes 1/2 ppd.  Upcoming PCP appt.  RNCM Clinical Goal(s):  Patient will verbalize understanding of plan for management of Anxiety, Depression, Allergic Rhinitis, and PNES, tobacco use  as evidenced by patient report verbalize basic understanding of  Anxiety, Depression, Allergic Rhinitis, and PNES, tobacco use  disease process and self health management plan as evidenced by patient report take all medications exactly as prescribed and will call provider for medication related questions as evidenced by patient report demonstrate understanding of rationale for each prescribed medication as evidenced by patient report attend all scheduled medical appointments as evidenced by patient report and EMR review demonstrate Ongoing adherence to prescribed treatment plan for Anxiety, Depression, Allergic Rhinitis, and PNES, tobacco use as evidenced by patient report and EMR review continue to work with RN Care Manager to address care management and care coordination needs related to  Anxiety, Depression, Allergic Rhinitis, and PNES as evidenced by adherence to CM Team Scheduled appointments through collaboration with RN Care manager, provider, and care team.   Interventions: Inter-disciplinary care team collaboration (see longitudinal plan of care) Evaluation of current treatment plan related to  self management and patient's adherence to plan as established by provider Collaborated with LCSW LCSW referral for CBT-completed  Smoking Cessation Interventions:  (Status:  New goal.) Long Term Goal Reviewed smoking history:  ; currently smoking 1/2 ppd On a scale of 1-10,  reports MOTIVATION to quit is 1 On a scale of 1-10, reports CONFIDENCE in quitting is 1 09/24/22:  Patient is not interested in stopping right now.  Evaluation of current treatment plan reviewed Advised patient to discuss smoking cessation options with provider Provided contact information for [place] [facility] (1-800-QUIT-NOW) Discussed plans with patient for ongoing care management follow up and provided patient with direct contact information for care management team Assessed social determinant of health barriers     (Status:  New goal.)  Long Term Goal Evaluation of current treatment plan related to Anxiety, Depression, Allergic Rhinitis, and PNES , self-management and patient's adherence to plan as established by provider. Discussed plans with patient for ongoing care management follow up and provided patient with direct contact information for care management team Evaluation of current treatment plan related to anxiety, depression, rhinitis, PNES  and patient's adherence to plan as established by provider Reviewed medications with patient Reviewed scheduled/upcoming provider appointments  Discussed plans with patient for ongoing care management follow up and provided patient with direct contact information for care management team Assessed social determinant of health barriers  Patient Goals/Self-Care Activities: Take all medications as prescribed Attend all scheduled provider appointments Call pharmacy for  medication refills 3-7 days in advance of running out of medications Perform all self care activities independently  Perform IADL's (shopping, preparing meals, housekeeping, managing finances) independently Call provider office for new concerns or questions   Follow Up Plan:  The patient has been provided with contact information for the care management team and has been advised to call with any health related questions or concerns.  The care management team will reach out to the  patient again over the next 45 business  days.

## 2023-06-13 NOTE — Patient Outreach (Signed)
Medicaid Managed Care   Nurse Care Manager Note  06/13/2023 Name:  Jaime Flores MRN:  [id number] DOB:  July 10, 1986  Jaime Flores is an 37 y.o. year old female who is a primary patient of [name], Elige Radon, MD.  The [facility] Managed Care Coordination team was consulted for assistance with:    Chronic healthcare management needs, tobacco use, anxiety/depression/BP, PNES  Jaime Flores was given information about Medicaid Managed Care Coordination team services today. Jaime Flores Patient agreed to services and verbal consent obtained.  Engaged with patient by telephone for follow up visit in response to provider referral for case management and/or care coordination services.   Assessments/Interventions:  Review of past medical history, allergies, medications, health status, including review of consultants reports, laboratory and other test data, was performed as part of comprehensive evaluation and provision of chronic care management services.  SDOH (Social Determinants of Health) assessments and interventions performed: SDOH Interventions    Flowsheet Row Patient Outreach Telephone from 06/13/2023 in [place] [facility] DEPARTMENT Office Visit from 06/10/2023 in [facility] Patient Outreach Telephone from 05/16/2023 in [place] POPULATION HEALTH DEPARTMENT Patient Outreach Telephone from 04/29/2023 in [place] POPULATION HEALTH DEPARTMENT Patient Outreach Telephone from 04/15/2023 in [place] POPULATION HEALTH DEPARTMENT Counselor from 04/08/2023 in [facility] [place]  SDOH Interventions        Housing Interventions -- -- -- Intervention Not Indicated -- --  Depression Interventions/Treatment  -- Medication, Currently on Treatment -- -- -- Medication, Counseling  Stress Interventions -- -- [employer], Provide Counseling  [Recent seizures] -- [employer], Chief Strategy Officer present regarding daughter] --  Social Connections Interventions -- -- -- Intervention Not Indicated -- --  Health Literacy Interventions Intervention Not Indicated -- -- -- -- --     Care Plan Allergies  Allergen Reactions   Catfish [Fish Allergy] Shortness Of Breath   Doxycycline Shortness Of Breath   Wellbutrin [Bupropion] Shortness Of Breath, Nausea Only and Palpitations   Tape Swelling    Plastic tape please use paper   Medications Reviewed Today     Reviewed by Jaime Chandler, RN (Registered Nurse) on 06/13/23 at 1239  Med List Status: <None>   Medication Order Taking? Sig Documenting Provider Last Dose Status Informant  cyclobenzaprine (FLEXERIL) 10 MG tablet [id number] No Take 1 tablet (10 mg total) by mouth 3 (three) times daily as needed for muscle spasms. [name], Elige Radon, MD Taking Active   lamoTRIgine (LAMICTAL) 25 MG tablet [id number]  Take 1 tablet (25 mg total) by mouth daily. [name], Elige Radon, MD  Active   predniSONE (DELTASONE) 20 MG tablet [id number]  2 po at same time daily for 5 days [name], Elige Radon, MD  Active   sertraline (ZOLOFT) 100 MG tablet [id number] No TAKE 1 AND 1/2 TABLET BY MOUTH ONCE DAILY. [name], Elige Radon, MD Taking Active            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   Prediabetes 02/15/2020   Anxiety 11/18/2019   Depression, recurrent (HCC) 11/18/2019   Seasonal and perennial allergic rhinitis 05/15/2019   Amenorrhea 01/27/2013   Conditions to be addressed/monitored per PCP order:  Chronic healthcare management needs, tobacco use, anxiety/depression/BP, PNES  Care Plan : RN Care Manager Plan of Care  Updates made by Jaime Chandler, RN since  06/13/2023 12:00 AM     Problem: Health Promotion or Disease Self-Management (General Plan of Care)      Long-Range Goal: Chronic Disease Management   Start Date: 08/23/2022   Expected End Date: 06/27/2023  Priority: High  Note:   Current Barriers:  Knowledge Deficits related to plan of care for management of Anxiety, Depression, allergic rhinitis, and PNES, tobacco use  Chronic Disease Management support and education needs related to Anxiety, Depression, Allergic Rhinitis, and PNES, tobacco use  06/13/23: 4 seizures in last month-improving with help of monthly CBT-virtual.  Had job interview today and is hopeful for new job.  Smokes 1/2 ppd.  Upcoming PCP appt.  RNCM Clinical Goal(s):  Patient will verbalize understanding of plan for management of Anxiety, Depression, Allergic Rhinitis, and PNES, tobacco use  as evidenced by patient report verbalize basic understanding of  Anxiety, Depression, Allergic Rhinitis, and PNES, tobacco use  disease process and self health management plan as evidenced by patient report take all medications exactly as prescribed and will call provider for medication related questions as evidenced by patient report demonstrate understanding of rationale for each prescribed medication as evidenced by patient report attend all scheduled medical appointments as evidenced by patient report and EMR review demonstrate Ongoing adherence to prescribed treatment plan for Anxiety, Depression, Allergic Rhinitis, and PNES, tobacco use as evidenced by patient report and EMR review continue to work with RN Care Manager to address care management and care coordination needs related to  Anxiety, Depression, Allergic Rhinitis, and PNES as evidenced by adherence to CM Team Scheduled appointments through collaboration with RN Care manager, provider, and care team.   Interventions: Inter-disciplinary care team collaboration (see longitudinal plan of care) Evaluation of current treatment plan related to  self management and patient's adherence to plan as established by provider Collaborated with LCSW LCSW referral for CBT-completed  Smoking Cessation  Interventions:  (Status:  New goal.) Long Term Goal Reviewed smoking history:  ; currently smoking 1/2 ppd On a scale of 1-10, reports MOTIVATION to quit is 1 On a scale of 1-10, reports CONFIDENCE in quitting is 1 09/24/22:  Patient is not interested in stopping right now.  Evaluation of current treatment plan reviewed Advised patient to discuss smoking cessation options with provider Provided contact information for [place] [facility] (1-800-QUIT-NOW) Discussed plans with patient for ongoing care management follow up and provided patient with direct contact information for care management team Assessed social determinant of health barriers     (Status:  New goal.)  Long Term Goal Evaluation of current treatment plan related to Anxiety, Depression, Allergic Rhinitis, and PNES , self-management and patient's adherence to plan as established by provider. Discussed plans with patient for ongoing care management follow up and provided patient with direct contact information for care management team Evaluation of current treatment plan related to anxiety, depression, rhinitis, PNES  and patient's adherence to plan as established by provider Reviewed medications with patient Reviewed scheduled/upcoming provider appointments  Discussed plans with patient for ongoing care management follow up and provided patient with direct contact information for care management team Assessed social determinant of health barriers  Patient Goals/Self-Care Activities: Take all medications as prescribed Attend all scheduled provider appointments Call pharmacy for medication refills 3-7 days in advance of running out of medications Perform all self care activities independently  Perform IADL's (shopping, preparing meals, housekeeping, managing finances) independently Call provider office for new concerns or questions   Follow Up Plan:  The patient has been  provided with contact information for the care management team  and has been advised to call with any health related questions or concerns.  The care management team will reach out to the patient again over the next 45 business  days.    Long-Range Goal: Establish Plan of Care for Chronic Disease Management Needs   Priority: High  Note:   Timeframe:  Long-Range Goal Priority:  High Start Date: 08/23/22                            Expected End Date: ongoing                      Follow Up Date 07/11/23   - practice safe sex - schedule appointment for flu shot - schedule appointment for vaccines needed due to my age or health - schedule recommended health tests (blood work, mammogram, colonoscopy, pap test) - schedule and keep appointment for annual check-up    Why is this important?   Screening tests can find diseases early when they are easier to treat.  Your doctor or nurse will talk with you about which tests are important for you.  Getting shots for common diseases like the flu and shingles will help prevent them.  06/13/23:  CBT continues monthly, virtual.  PCP 12/12   Follow Up:  Patient agrees to Care Plan and Follow-up.  Plan: The Managed Medicaid care management team will reach out to the patient again over the next 30 business  days. and The  Patient has been provided with contact information for the Managed Medicaid care management team and has been advised to call with any health related questions or concerns.  Date/time of next scheduled RN care management/care coordination outreach: 07/11/23 at 115

## 2023-06-15 ENCOUNTER — Encounter: Payer: Self-pay | Admitting: Family Medicine

## 2023-06-18 ENCOUNTER — Ambulatory Visit (INDEPENDENT_AMBULATORY_CARE_PROVIDER_SITE_OTHER): Payer: Medicaid Other | Admitting: Clinical

## 2023-06-18 DIAGNOSIS — F331 Major depressive disorder, recurrent, moderate: Secondary | ICD-10-CM

## 2023-06-18 DIAGNOSIS — F419 Anxiety disorder, unspecified: Secondary | ICD-10-CM

## 2023-06-18 NOTE — Progress Notes (Addendum)
Virtual Visit via Video Note   I connected with Jaime Flores on 06/18/23 at  11:00 AM EST by a video enabled telemedicine application and verified that I am speaking with the correct person using two identifiers.   Location: Patient: Home Provider: Office   I discussed the limitations of evaluation and management by telemedicine and the availability of in person appointments. The patient expressed understanding and agreed to proceed.     THERAPIST PROGRESS NOTE   Session Time: 11:00 AM- 11:55 AM   Participation Level: Active   Behavioral Response: CasualAlertDepressed   Type of Therapy: Individual Therapy   Treatment Goals addressed: Coping for Anxiety and mood management   Interventions: CBT, Motivational Interviewing, Solution Focused and Strength-based   Summary: Jaime Flores is a 37 y.o. female who presents with Depression and Anxiety . The OPT therapist worked with the patient for her OPT treatment. The OPT therapist utilized Motivational Interviewing to assist in creating therapeutic repore. The patient in the session was engaged and work in collaboration giving feedback about her past few weeks. The patient spoke about her stressors and ongoing work in her relationship with her Mother who she lives with coupled with not currently being employed, however, noted she has been applying a lot since her last session. The OPT therapist utilized Cognitive Behavioral Therapy through cognitive restructuring as well as worked with the patient on coping strategies to assist in management of mood. The OPT therapist worked with the patient on self check ins and utilizing her support network. The OPT therapist overviewed coping strategies for season change with the patient including reviewing her awareness of exposure to light and being active. The OPT therapist competed a mood scale evaluation. The patient spoke about recent changes with her med therapy that so far she is responding to  well and is currently helping her manage her MH symptoms as well as regulate her sleep cycle.The patient spoke about her plans for the upcoming Thanksgiving holiday, but acknowledged her concern due to her financial situation and not currently working with Christmas coming in the next month. The patient spoke about additionally her external stressor of her partners children being in DSS custody and concern they will rule to send them into the Adoption system. The patient overviewed her upcoming health appointments as listed in patients MyChart.   Suicidal/Homicidal: Nowithout intent/plan   Therapist Response: The OPT therapist worked with the patient for the patients scheduled session. The patient was engaged in her session and gave feedback in relation to triggers, symptoms, and behavior responses over the past few weeks. The patient spoke about in home dynamics being a adult living with her Mother and parenting kids one of which is special needs, and impact of not having income and the stress of kids being in DSS custody. The patient spoke about her interactions with her Mother and communication/work to improve their relationship with her mother working on managing her responsive behaviors and interactions with others in the home. The OPT therapist worked with the patient utilizing an in session Cognitive Behavioral Therapy exercise. The patient was responsive in the session and verbalized, ".I decided I am going to try to enjoy Thanksgiving and try not to worry so much about Christmas due to me not having income since I have some more time to work on Christmas ". The OPT therapist worked with the patient on herself fact checking and not allowing others to negatively impact her functioning. The OPT gave support and encouragement with  the patient who will work on using coping strategies with consistency and being aware of her baseline and working on ongoing consistency in meeting her treatment goals with  consistency. The patient spoke about looking forward to upcoming Thanksgiving holiday and her ongoing work to find employment, as well as her response to recent med management changes. The OPT therapist will continue treatment work with the patient in her next scheduled session.   Plan: Return again in 2/3 weeks.   Diagnosis:      Axis I:  Recurrent moderate MDD with Anxiety                         Axis II: No diagnosis   Collaboration of Care: None   Patient/Guardian was advised Release of Information must be obtained prior to any record release in order to collaborate their care with an outside provider. Patient/Guardian was advised if they have not already done so to contact the registration department to sign all necessary forms in order for [place] to release information regarding their care.    Consent: Patient/Guardian gives verbal consent for treatment and assignment of benefits for services provided during this visit. Patient/Guardian expressed understanding and agreed to proceed.        I discussed the assessment and treatment plan with the patient. The patient was provided an opportunity to ask questions and all were answered. The patient agreed with the plan and demonstrated an understanding of the instructions.   The patient was advised to call back or seek an in-person evaluation if the symptoms worsen or if the condition fails to improve as anticipated.   I provided 55 minutes of non-face-to-face time during this encounter.   Suzan Garibaldi, LCSW   06/18/2023

## 2023-06-18 NOTE — Addendum Note (Signed)
Addended by: Suzan Garibaldi T on: 06/18/2023 11:55 AM   Modules accepted: Level of Service

## 2023-06-24 ENCOUNTER — Other Ambulatory Visit: Payer: Self-pay | Admitting: Family Medicine

## 2023-07-04 ENCOUNTER — Encounter: Payer: Self-pay | Admitting: Family Medicine

## 2023-07-04 ENCOUNTER — Telehealth: Payer: Medicaid Other | Admitting: Family Medicine

## 2023-07-04 DIAGNOSIS — F339 Major depressive disorder, recurrent, unspecified: Secondary | ICD-10-CM

## 2023-07-04 DIAGNOSIS — F419 Anxiety disorder, unspecified: Secondary | ICD-10-CM

## 2023-07-04 MED ORDER — QUETIAPINE FUMARATE 50 MG PO TABS
50.0000 mg | ORAL_TABLET | Freq: Every day | ORAL | 2 refills | Status: DC
Start: 2023-07-04 — End: 2023-09-18

## 2023-07-04 NOTE — Progress Notes (Signed)
Virtual Visit via MyChart video note  I connected with Jaime Flores on 07/04/23 at 1559 by video and verified that I am speaking with the correct person using two identifiers. Jaime Flores is currently located at car and patient and family are currently with her during visit. The provider, Elige Radon Keshun Berrett, MD is located in their office at time of visit.  Call ended at 1612  I discussed the limitations, risks, security and privacy concerns of performing an evaluation and management service by video and the availability of in person appointments. I also discussed with the patient that there may be a patient responsible charge related to this service. The patient expressed understanding and agreed to proceed.   History and Present Illness: Anxiety and depression recheck Patient was started on lamotrigine along with zoloft.  She feels like sometimes she gets too happy sometimes and does not take it every day. She denies any suicidal ideation or thoughts of hurting herself. She denies depression or anxiety.   1. Depression, recurrent (HCC)   2. Anxiety     Outpatient Encounter Medications as of 07/04/2023  Medication Sig   QUEtiapine (SEROQUEL) 50 MG tablet Take 1 tablet (50 mg total) by mouth at bedtime.   cyclobenzaprine (FLEXERIL) 10 MG tablet TAKE (1) TABLET BY MOUTH (3) TIMES DAILY AS NEEDED FOR MUSCLE SPASMS.   lamoTRIgine (LAMICTAL) 25 MG tablet Take 1 tablet (25 mg total) by mouth daily.   predniSONE (DELTASONE) 20 MG tablet 2 po at same time daily for 5 days   sertraline (ZOLOFT) 100 MG tablet TAKE 1 AND 1/2 TABLET BY MOUTH ONCE DAILY.   No facility-administered encounter medications on file as of 07/04/2023.    Review of Systems  Constitutional:  Negative for chills and fever.  Eyes:  Negative for visual disturbance.  Respiratory:  Negative for chest tightness and shortness of breath.   Cardiovascular:  Negative for chest pain and leg swelling.  Skin:  Negative  for rash.  Neurological:  Negative for dizziness, light-headedness and headaches.  Psychiatric/Behavioral:  Positive for decreased concentration and dysphoric mood. Negative for agitation, behavioral problems, self-injury, sleep disturbance and suicidal ideas. The patient is nervous/anxious.   All other systems reviewed and are negative.   Observations/Objective: Patient sounds comfortable and in no acute distress  Assessment and Plan: Problem List Items Addressed This Visit       Other   Anxiety   Relevant Medications   QUEtiapine (SEROQUEL) 50 MG tablet   Depression, recurrent (HCC) - Primary   Relevant Medications   QUEtiapine (SEROQUEL) 50 MG tablet    Take for 2 days and take 1/2 tablet every 3rd day.  She seems to be doing well.  Follow up plan: Return if symptoms worsen or fail to improve, for 2 month anxiety and depression.     I discussed the assessment and treatment plan with the patient. The patient was provided an opportunity to ask questions and all were answered. The patient agreed with the plan and demonstrated an understanding of the instructions.   The patient was advised to call back or seek an in-person evaluation if the symptoms worsen or if the condition fails to improve as anticipated.  The above assessment and management plan was discussed with the patient. The patient verbalized understanding of and has agreed to the management plan. Patient is aware to call the clinic if symptoms persist or worsen. Patient is aware when to return to the clinic for a follow-up visit.  Patient educated on when it is appropriate to go to the emergency department.    I provided 13 minutes of non-face-to-face time during this encounter.    Nils Pyle, MD

## 2023-07-05 ENCOUNTER — Ambulatory Visit (HOSPITAL_COMMUNITY): Payer: Medicaid Other | Admitting: Clinical

## 2023-07-05 ENCOUNTER — Other Ambulatory Visit: Payer: Self-pay | Admitting: *Deleted

## 2023-07-05 DIAGNOSIS — F419 Anxiety disorder, unspecified: Secondary | ICD-10-CM

## 2023-07-05 DIAGNOSIS — F339 Major depressive disorder, recurrent, unspecified: Secondary | ICD-10-CM

## 2023-07-05 MED ORDER — SERTRALINE HCL 100 MG PO TABS
150.0000 mg | ORAL_TABLET | Freq: Every day | ORAL | 2 refills | Status: DC
Start: 2023-07-05 — End: 2024-04-20

## 2023-07-09 ENCOUNTER — Ambulatory Visit
Admission: EM | Admit: 2023-07-09 | Discharge: 2023-07-09 | Disposition: A | Discharge status: Home / Self Care | Payer: Medicaid Other | Attending: Nurse Practitioner | Admitting: Nurse Practitioner

## 2023-07-09 ENCOUNTER — Ambulatory Visit (INDEPENDENT_AMBULATORY_CARE_PROVIDER_SITE_OTHER): Payer: Medicaid Other

## 2023-07-09 DIAGNOSIS — S6992XA Unspecified injury of left wrist, hand and finger(s), initial encounter: Secondary | ICD-10-CM

## 2023-07-09 DIAGNOSIS — S61209A Unspecified open wound of unspecified finger without damage to nail, initial encounter: Secondary | ICD-10-CM | POA: Diagnosis not present

## 2023-07-09 DIAGNOSIS — M79642 Pain in left hand: Secondary | ICD-10-CM

## 2023-07-09 MED ORDER — MUPIROCIN 2 % EX OINT: 1.0000 | TOPICAL_OINTMENT | Freq: Two times a day (BID) | CUTANEOUS | 0 refills | Status: DC

## 2023-07-09 NOTE — ED Triage Notes (Signed)
Pt reports the metal bracket hit her L-hand  today. Patient states pain and swelling. R-hand index was cut on a piece of metal. Patient would like a tetanus injection today.

## 2023-07-09 NOTE — Discharge Instructions (Addendum)
X-ray of the left hand is pending.  You will be contacted if the x-ray result is abnormal.  You also have access to the results via MyChart. Apply medication as prescribed. Keep the dressing in place to the right index finger for the next 24 hours. Your tetanus shot is up-to-date.  It is good for 10 years. May take over-the-counter Tylenol or ibuprofen as needed for pain, fever, or general discomfort. RICE therapy, rest, ice, compression, and elevation.  Apply ice for 20 minutes, remove for 1 hour, repeat as needed to help with pain and swelling. An Ace wrap has been provided to allow for additional compression and support.  Wear the Ace wrap when you are engaged in prolonged or strenuous activity. Monitor the injury of the right index finger for worsening.  If you notice increased redness, swelling, or foul-smelling drainage from the site, follow-up in this clinic or in the emergency department for further evaluation. Follow-up as needed.

## 2023-07-09 NOTE — ED Provider Notes (Signed)
[facility]    CSN: [id number] Arrival date & time: 07/09/23  1431      History   Chief Complaint Chief Complaint  Patient presents with   Finger Injury   Hand Pain    HPI Jaime Flores is a 37 y.o. female.   The history is provided by the patient.   Patient presents for complaints of left hand pain and an injury to the left index finger.  Patient states she was using a saw to cut metal when the saw kicked back and her left hand hit a metal bracket.  Patient states that when she attempts to make a fist, she has pain that radiates to the left ring finger.  She states that she also has noticed some swelling to the left hand.  Patient states that she also was trying to throw a piece of metal when it cut her right index finger.  Patient states that the area continues to bleed.  States her last tetanus shot was in 2019.  Patient denies fever, chills, numbness, tingling, or foul-smelling drainage.   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 Authorizing Provider  lamoTRIgine (LAMICTAL) 25 MG tablet Take 1 tablet (25 mg total) by mouth daily. 06/10/23  Yes [name], Elige Radon, MD  mupirocin ointment (BACTROBAN) 2 % Apply 1 Application topically 2 (two) times daily. 07/09/23  Yes Leath-Warren, Sadie Haber, NP  QUEtiapine (SEROQUEL) 50 MG tablet Take 1 tablet (50 mg total) by mouth at bedtime. 07/04/23  Yes [name], Elige Radon, MD  sertraline (ZOLOFT) 100 MG tablet Take 1.5 tablets (150 mg total) by mouth daily. 07/05/23  Yes [name], Elige Radon, MD  cyclobenzaprine (FLEXERIL) 10 MG tablet TAKE (1) TABLET BY MOUTH (3) TIMES DAILY AS NEEDED FOR MUSCLE SPASMS. 06/26/23   [name], Elige Radon, MD  predniSONE (DELTASONE) 20 MG tablet 2 po at same time daily for 5 days 06/10/23   [name], Elige Radon, MD    Family History Family History  Problem Relation Age of Onset   Autism Son    Diabetes Maternal Grandmother  Stroke Maternal Grandmother    Heart failure Maternal Grandmother    Stroke Maternal Grandfather    Stroke Father    Seizures Father    Allergies Father        penicillin   Heart attack Father    Other Mother        herpes   Hyperlipidemia Mother    Heart failure Paternal Grandfather    Diabetes Paternal Grandmother    Hypertension Paternal Grandmother    Autism Brother    Other Paternal Uncle        heart exploded   Cancer Maternal Aunt    Diabetes Maternal Aunt    Hypertension Maternal Aunt    Immunodeficiency Neg Hx    Urticaria Neg Hx    Eczema Neg Hx    Atopy Neg Hx    Asthma Neg Hx    Angioedema Neg Hx    Allergic rhinitis Neg Hx     Social History Social History   Tobacco Use   Smoking status: Every Day    Current packs/day: 0.50    Types: Cigarettes   Smokeless tobacco: Never  Vaping Use   Vaping status: Never Used  Substance Use Topics   Alcohol use: No   Drug use: No      Allergies   Catfish [fish allergy], Doxycycline, Wellbutrin [bupropion], and Tape   Review of Systems Review of Systems Per HPI  Physical Exam Triage Vital Signs ED Triage Vitals  Encounter Vitals Group     BP 07/09/23 1520 123/80     Systolic BP Percentile --      Diastolic BP Percentile --      Pulse Rate 07/09/23 1520 83     Resp 07/09/23 1520 16     Temp 07/09/23 1520 98.6 F (37 C)     Temp Source 07/09/23 1520 Oral     SpO2 07/09/23 1520 98 %     Weight --      Height --      Head Circumference --      Peak Flow --      Pain Score 07/09/23 1524 5     Pain Loc --      Pain Education --      Exclude from Growth Chart --    No data found.  Updated Vital Signs BP 123/80 (BP Location: Left Arm)   Pulse 83   Temp 98.6 F (37 C) (Oral)   Resp 16   LMP 06/17/2023 (Approximate)   SpO2 98%   Visual Acuity Right Eye Distance:   Left Eye Distance:   Bilateral Distance:    Right Eye Near:   Left Eye Near:    Bilateral Near:     Physical Exam Vitals and nursing note reviewed.  Constitutional:      General: She is not in acute distress.    Appearance: Normal appearance.  HENT:     Head: Normocephalic.  Eyes:     Extraocular Movements: Extraocular movements intact.     Pupils: Pupils are equal, round, and reactive to light.  Pulmonary:     Effort: Pulmonary effort is normal.  Musculoskeletal:     Left hand: Swelling (dorsal aspect of left hand) and tenderness (dorsal aspect of left hand) present. Decreased range of motion (d/t pain). Normal sensation. Normal capillary refill. Normal pulse.     Cervical back: Normal range of motion.  Skin:    General: Skin is warm and dry.     Comments:  Avulsion injury to the palmar aspect of the right index finger at the PIP joint. Mild bleeding present.   Neurological:     General: No focal deficit present.     Mental Status: She is alert and oriented to person, place, and time.  Psychiatric:        Mood and  Affect: Mood normal.        Behavior: Behavior normal.      UC Treatments / Results  Labs (all labs ordered are listed, but only abnormal results are displayed) Labs Reviewed - No data to display  EKG   Radiology No results found.  Procedures Procedures (including critical care time)  Medications Ordered in UC Medications - No data to display  Initial Impression / Assessment and Plan / UC Course  I have reviewed the triage vital signs and the nursing notes.  Pertinent labs & imaging results that were available during my care of the patient were reviewed by me and considered in my medical decision making (see chart for details).  Wound to right index finger was cleaned and dressed.  X-ray of the left hand is pending.  Ace wrap applied to the left hand to provide additional compression and support.  Mupirocin ointment 2% to the right index finger to prevent infection.  Supportive care recommendations were provided and discussed with the patient to include over-the-counter analgesics and RICE therapy.  Wound care instructions were also discussed with the patient to include keeping the current dressing in place for the next 24 hours, and monitoring for signs of infection. Final Clinical Impressions(s) / UC Diagnoses   Final diagnoses:  Avulsion of skin of finger, initial encounter  Left hand pain  Hand injury, left, initial encounter     Discharge Instructions      X-ray of the left hand is pending.  You will be contacted if the x-ray result is abnormal.  You also have access to the results via MyChart. Apply medication as prescribed. Keep the dressing in place to the right index finger for the next 24 hours. Your tetanus shot is up-to-date.  It is good for 10 years. May take over-the-counter Tylenol or ibuprofen as needed for pain, fever, or general discomfort. RICE therapy, rest, ice, compression, and elevation.  Apply ice for 20 minutes, remove for 1 hour, repeat as  needed to help with pain and swelling. An Ace wrap has been provided to allow for additional compression and support.  Wear the Ace wrap when you are engaged in prolonged or strenuous activity. Monitor the injury of the right index finger for worsening.  If you notice increased redness, swelling, or foul-smelling drainage from the site, follow-up in this clinic or in the emergency department for further evaluation. Follow-up as needed.     ED Prescriptions     Medication Sig Dispense Auth. Provider   mupirocin ointment (BACTROBAN) 2 % Apply 1 Application topically 2 (two) times daily. 30 g Leath-Warren, Sadie Haber, NP      PDMP not reviewed this encounter.   Abran Cantor, NP 07/09/23 [phone]

## 2023-07-11 ENCOUNTER — Other Ambulatory Visit: Payer: Self-pay | Admitting: Obstetrics and Gynecology

## 2023-07-11 NOTE — Patient Outreach (Signed)
Medicaid Managed Care   Nurse Care Manager Note  07/11/2023 Name:  Jaime Flores MRN:  [id number] DOB:  [birth year]  Jaime Flores is an 37 y.o. year old female who is a primary patient of [name], Elige Radon, MD.  The [facility] Managed Care Coordination team was consulted for assistance with:    Chronic healthcare management needs, tobacco use, anxiety/depression/BP, PNES  Jaime Flores was given information about Medicaid Managed Care Coordination team services today. Jaime Flores Patient agreed to services and verbal consent obtained.  Engaged with patient by telephone for follow up visit in response to provider referral for case management and/or care coordination services.   Patient is participating in a Managed Medicaid Plan:  Yes  Assessments/Interventions:  Review of past medical history, allergies, medications, health status, including review of consultants reports, laboratory and other test data, was performed as part of comprehensive evaluation and provision of chronic care management services.  SDOH (Social Drivers of Health) assessments and interventions performed: SDOH Interventions    Flowsheet Row Patient Outreach Telephone from 07/11/2023 in [place] [facility]PARTMENT Patient Outreach Telephone from 06/13/2023 in [place] POPULATION HEALTH DEPARTMENT Office Visit from 06/10/2023 in [facility] Patient Outreach Telephone from 05/16/2023 in [place] POPULATION HEALTH DEPARTMENT Patient Outreach Telephone from 04/29/2023 in [place] POPULATION HEALTH DEPARTMENT Patient Outreach Telephone from 04/15/2023 in  POPULATION HEALTH DEPARTMENT  SDOH Interventions        Housing Interventions -- -- -- -- Intervention Not Indicated --  Utilities Interventions Intervention Not Indicated -- -- -- -- --  Alcohol Usage Interventions Intervention Not Indicated (Score <7) -- -- -- -- --  Depression  Interventions/Treatment  -- -- Medication, Currently on Treatment -- -- --  Stress Interventions -- -- -- [employer], Provide Counseling  [Recent seizures] -- [employer], Chief Strategy Officer present regarding daughter]  Social Connections Interventions -- -- -- -- Intervention Not Indicated --  Health Literacy Interventions -- Intervention Not Indicated -- -- -- --     Care Plan Allergies  Allergen Reactions   Catfish [Fish Allergy] Shortness Of Breath   Doxycycline Shortness Of Breath   Wellbutrin [Bupropion] Shortness Of Breath, Nausea Only and Palpitations   Tape Swelling    Plastic tape please use paper   Medications Reviewed Today     Reviewed by Jaime Chandler, RN (Registered Nurse) on 07/11/23 at 1320  Med List Status: <None>   Medication Order Taking? Sig Documenting Provider Last Dose Status Informant  cyclobenzaprine (FLEXERIL) 10 MG tablet [id number]  TAKE (1) TABLET BY MOUTH (3) TIMES DAILY AS NEEDED FOR MUSCLE SPASMS. [name], Elige Radon, MD  Active   lamoTRIgine (LAMICTAL) 25 MG tablet [id number] No Take 1 tablet (25 mg total) by mouth daily. [name], Elige Radon, MD 07/09/2023 Active   mupirocin ointment (BACTROBAN) 2 % [id number]  Apply 1 Application topically 2 (two) times daily. Leath-Warren, Sadie Haber, NP  Active   predniSONE (DELTASONE) 20 MG tablet [id number]  2 po at same time daily for 5 days [name], Elige Radon, MD  Active   QUEtiapine (SEROQUEL) 50 MG tablet [id number] No Take 1 tablet (50 mg total) by mouth at bedtime. [name], Elige Radon, MD 07/09/2023 Active   sertraline (ZOLOFT) 100 MG tablet [id number] No Take 1.5 tablets (150 mg total) by mouth daily. 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   Prediabetes 02/15/2020   Anxiety 11/18/2019   Depression, recurrent (HCC) 11/18/2019    Seasonal and perennial allergic rhinitis 05/15/2019   Amenorrhea 01/27/2013   Conditions to be addressed/monitored per PCP order:  Chronic healthcare management needs, tobacco use, anxiety/depression/BP, PNES  Care Plan : RN Care Manager Plan of Care  Updates made by Jaime Chandler, RN since 07/11/2023 12:00 AM     Problem: Health Promotion or Disease Self-Management (General Plan of Care)      Long-Range Goal: Chronic Disease Management   Start Date: 08/23/2022  Expected End Date: 10/09/2023  Priority: High  Note:   Current Barriers:  Knowledge Deficits related to plan of care for management of Anxiety, Depression, allergic rhinitis, and PNES, tobacco use  Chronic Disease Management support and education needs related to Anxiety, Depression, Allergic Rhinitis, and PNES, tobacco use  07/11/23:  No seizure activity since medication change.  To follow up on CBT appt.  Still looking for a job.  Smokes 1/2 ppd.  Seen at [facility] 12/72for left hand injury-better now.  RNCM Clinical Goal(s):  Patient will verbalize understanding of plan for management of Anxiety, Depression, Allergic Rhinitis, and PNES, tobacco use  as evidenced by patient report verbalize basic understanding of  Anxiety, Depression, Allergic Rhinitis, and PNES, tobacco use  disease process and self health management plan as evidenced by patient report take all medications exactly as prescribed and will call provider for medication related questions as evidenced by patient report demonstrate understanding of rationale for each prescribed medication as evidenced by patient report attend all scheduled medical appointments as evidenced by patient report and EMR review demonstrate Ongoing adherence to prescribed treatment plan for Anxiety, Depression, Allergic Rhinitis, and PNES, tobacco use as evidenced by patient report and EMR review continue to work with RN Care Manager to address care management and care coordination needs related  to  Anxiety, Depression, Allergic Rhinitis, and PNES as evidenced by adherence to CM Team Scheduled appointments through collaboration with RN Care manager, provider, and care team.   Interventions: Inter-disciplinary care team collaboration (see longitudinal plan of care) Evaluation of current treatment plan related to  self management and patient's adherence to plan as established by provider Collaborated with LCSW LCSW referral for CBT-completed  Smoking Cessation Interventions:  (Status:  New goal.) Long Term Goal Reviewed smoking history:  ; currently smoking 1/2 ppd On a scale of 1-10, reports MOTIVATION to quit is 1 On a scale of 1-10, reports CONFIDENCE in quitting is 1 09/24/22:  Patient is not interested in stopping right now.  Evaluation of current treatment plan reviewed Advised patient to discuss smoking cessation options with provider Provided contact information for [place] [facility] (1-800-QUIT-NOW) Discussed plans with patient for ongoing care management follow up and provided patient with direct contact information for care management team Assessed social determinant of health barriers     (Status:  New goal.)  Long Term Goal Evaluation of current treatment plan related to Anxiety, Depression, Allergic Rhinitis, and PNES , self-management and patient's adherence to plan as established by provider. Discussed plans with patient for ongoing care management follow up and provided patient with direct contact information for care management team Evaluation of current treatment plan related to anxiety, depression, rhinitis, PNES  and patient's adherence to plan as established by provider Reviewed medications with patient Reviewed scheduled/upcoming provider appointments  Discussed plans with patient for ongoing  care management follow up and provided patient with direct contact information for care management team Assessed social determinant of health barriers  Patient  Goals/Self-Care Activities: Take all medications as prescribed Attend all scheduled provider appointments Call pharmacy for medication refills 3-7 days in advance of running out of medications Perform all self care activities independently  Perform IADL's (shopping, preparing meals, housekeeping, managing finances) independently Call provider office for new concerns or questions  07/11/23:  Schedule f/u CBT appt.  Follow Up Plan:  The patient has been provided with contact information for the care management team and has been advised to call with any health related questions or concerns.  The care management team will reach out to the patient again over the next 45 business  days.    Long-Range Goal: Establish Plan of Care for Chronic Disease Management Needs   Priority: High  Note:   Timeframe:  Long-Range Goal Priority:  High Start Date: 08/23/22                            Expected End Date: ongoing                      Follow Up Date 08/13/23   - practice safe sex - schedule appointment for flu shot - schedule appointment for vaccines needed due to my age or health - schedule recommended health tests (blood work, mammogram, colonoscopy, pap test) - schedule and keep appointment for annual check-up    Why is this important?   Screening tests can find diseases early when they are easier to treat.  Your doctor or nurse will talk with you about which tests are important for you.  Getting shots for common diseases like the flu and shingles will help prevent them.  07/11/23: Upcoming appt with PCP and NEURO   Follow Up:  Patient agrees to Care Plan and Follow-up.  Plan: The Managed Medicaid care management team will reach out to the patient again over the next 30 business  days. and The  Patient has been provided with contact information for the Managed Medicaid care management team and has been advised to call with any health related questions or concerns.  Date/time of next  scheduled RN care management/care coordination outreach: 08/13/23 at 230

## 2023-07-11 NOTE — Patient Instructions (Signed)
Hi Ms. Hromadka, I am glad you are doing okay today, have a nice afternoon.  Ms. Dold was given information about Medicaid Managed Care team care coordination services as a part of their Healthy [facility] Medicaid benefit. [name] verbally consented to engagement with the [facility] Managed Care team.   If you are experiencing a medical emergency, please call 911 or report to your local emergency department or urgent care.   If you have a non-emergency medical problem during routine business hours, please contact your provider's office and ask to speak with a nurse.   For questions related to your Healthy [facility] health plan, please call: [phone] or visit the homepage here: [email or web address]  If you would like to schedule transportation through your Healthy [facility] plan, please call the following number at least 2 days in advance of your appointment: [phone]  For information about your ride after you set it up, call Ride Assist at [phone]. Use this number to activate a Will Call pickup, or if your transportation is late for a scheduled pickup. Use this number, too, if you need to make a change or cancel a previously scheduled reservation.  If you need transportation services right away, call [phone]. The after-hours call center is staffed 24 hours to handle ride assistance and urgent reservation requests (including discharges) 365 days a year. Urgent trips include sick visits, hospital discharge requests and life-sustaining treatment.  Call the [facility] Line at [phone], at any time, 24 hours a day, 7 days a week. If you are in danger or need immediate medical attention call 911.  If you would like help to quit smoking, call 1-800-QUIT-NOW ((463) 757-3634) OR Espaol: 1-855-Djelo-Ya (4-742-595-6387) o para ms informacin haga clic aqu or Text READY to 564-332 to register via text  Ms. Emelda Fear -  following are the goals we discussed in your visit today:   Goals Addressed    Timeframe:  Long-Range Goal Priority:  High Start Date: 08/23/22                            Expected End Date: ongoing                      Follow Up Date 08/13/23   - practice safe sex - schedule appointment for flu shot - schedule appointment for vaccines needed due to my age or health - schedule recommended health tests (blood work, mammogram, colonoscopy, pap test) - schedule and keep appointment for annual check-up    Why is this important?   Screening tests can find diseases early when they are easier to treat.  Your doctor or nurse will talk with you about which tests are important for you.  Getting shots for common diseases like the flu and shingles will help prevent them.  07/11/23: Upcoming appt with PCP and NEURO  Patient verbalizes understanding of instructions and care plan provided today and agrees to view in MyChart. Active MyChart status and patient understanding of how to access instructions and care plan via MyChart confirmed with patient.     The Managed Medicaid care management team will reach out to the patient again over the next 30 business  days.  The  Patient  has been provided with contact information for the Managed Medicaid care management team and has been advised to call with any health related questions or concerns.   [name] RN, BSN [employer]  Network Care Management Coordinator - Managed Medicaid High Risk [phone]   Following is a copy of your plan of care:  Care Plan : RN Care Manager Plan of Care  Updates made by [name], RN since 07/11/2023 12:00 AM     Problem: Health Promotion or Disease Self-Management (General Plan of Care)      Long-Range Goal: Chronic Disease Management   Start Date: 08/23/2022  Expected End Date: 10/09/2023  Priority: High  Note:   Current Barriers:  Knowledge Deficits related to plan of care for  management of Anxiety, Depression, allergic rhinitis, and PNES, tobacco use  Chronic Disease Management support and education needs related to Anxiety, Depression, Allergic Rhinitis, and PNES, tobacco use  07/11/23:  No seizure activity since medication change.  To follow up on CBT appt.  Still looking for a job.  Smokes 1/2 ppd.  Seen at [facility] 12/80for left hand injury-better now.  RNCM Clinical Goal(s):  Patient will verbalize understanding of plan for management of Anxiety, Depression, Allergic Rhinitis, and PNES, tobacco use  as evidenced by patient report verbalize basic understanding of  Anxiety, Depression, Allergic Rhinitis, and PNES, tobacco use  disease process and self health management plan as evidenced by patient report take all medications exactly as prescribed and will call provider for medication related questions as evidenced by patient report demonstrate understanding of rationale for each prescribed medication as evidenced by patient report attend all scheduled medical appointments as evidenced by patient report and EMR review demonstrate Ongoing adherence to prescribed treatment plan for Anxiety, Depression, Allergic Rhinitis, and PNES, tobacco use as evidenced by patient report and EMR review continue to work with RN Care Manager to address care management and care coordination needs related to  Anxiety, Depression, Allergic Rhinitis, and PNES as evidenced by adherence to CM Team Scheduled appointments through collaboration with RN Care manager, provider, and care team.   Interventions: Inter-disciplinary care team collaboration (see longitudinal plan of care) Evaluation of current treatment plan related to  self management and patient's adherence to plan as established by provider Collaborated with LCSW LCSW referral for CBT-completed  Smoking Cessation Interventions:  (Status:  New goal.) Long Term Goal Reviewed smoking history:  ; currently smoking 1/2 ppd On a scale of  1-10, reports MOTIVATION to quit is 1 On a scale of 1-10, reports CONFIDENCE in quitting is 1 09/24/22:  Patient is not interested in stopping right now.  Evaluation of current treatment plan reviewed Advised patient to discuss smoking cessation options with provider Provided contact information for [place] [facility]uit Line (1-800-QUIT-NOW) Discussed plans with patient for ongoing care management follow up and provided patient with direct contact information for care management team Assessed social determinant of health barriers     (Status:  New goal.)  Long Term Goal Evaluation of current treatment plan related to Anxiety, Depression, Allergic Rhinitis, and PNES , self-management and patient's adherence to plan as established by provider. Discussed plans with patient for ongoing care management follow up and provided patient with direct contact information for care management team Evaluation of current treatment plan related to anxiety, depression, rhinitis, PNES  and patient's adherence to plan as established by provider Reviewed medications with patient Reviewed scheduled/upcoming provider appointments  Discussed plans with patient for ongoing care management follow up and provided patient with direct contact information for care management team Assessed social determinant of health barriers  Patient Goals/Self-Care Activities: Take all medications as prescribed Attend all scheduled provider appointments Call pharmacy for medication  refills 3-7 days in advance of running out of medications Perform all self care activities independently  Perform IADL's (shopping, preparing meals, housekeeping, managing finances) independently Call provider office for new concerns or questions  07/11/23:  Schedule f/u CBT appt.  Follow Up Plan:  The patient has been provided with contact information for the care management team and has been advised to call with any health related questions or concerns.  The care  management team will reach out to the patient again over the next 45 business  days.

## 2023-07-19 ENCOUNTER — Encounter: Payer: Self-pay | Admitting: Family Medicine

## 2023-07-23 ENCOUNTER — Ambulatory Visit (HOSPITAL_COMMUNITY): Payer: Medicaid Other | Admitting: Clinical

## 2023-07-23 DIAGNOSIS — F331 Major depressive disorder, recurrent, moderate: Secondary | ICD-10-CM | POA: Diagnosis not present

## 2023-07-23 DIAGNOSIS — F419 Anxiety disorder, unspecified: Secondary | ICD-10-CM

## 2023-07-23 NOTE — Progress Notes (Signed)
 Virtual Visit via Video Note   I connected with Jaime Flores on 07/23/23 at  9:00 AM EST by a video enabled telemedicine application and verified that I am speaking with the correct person using two identifiers.   Location: Patient: Home Provider: Office   I discussed the limitations of evaluation and management by telemedicine and the availability of in person appointments. The patient expressed understanding and agreed to proceed.     THERAPIST PROGRESS NOTE   Session Time: 9:00 AM- 9:45 AM   Participation Level: Active   Behavioral Response: CasualAlertDepressed   Type of Therapy: Individual Therapy   Treatment Goals addressed: Coping for Anxiety and mood management   Interventions: CBT, Motivational Interviewing, Solution Focused and Strength-based   Summary: Jaime Flores is a 37 y.o. female who presents with Depression and Anxiety . The OPT therapist worked with the patient for her OPT treatment. The OPT therapist utilized Motivational Interviewing to assist in creating therapeutic repore. The patient in the session was engaged and work in collaboration giving feedback about her past few weeks. The patient spoke about her stressors including son having stomach problem due to constipation, husband having a intense medical situation leading him to being placed in ICU but since being released home, and learning her daughter will need surgery to address scoliosis. The patient additionally noted she did get hired and has started a new job. The OPT therapist utilized Cognitive Behavioral Therapy through cognitive restructuring as well as worked with the patient on coping strategies to assist in management of mood. The OPT therapist worked with the patient on self check ins and utilizing her support network. The OPT therapist overviewed coping strategies for season change with the patient including reviewing her awareness of exposure to light and being active. The OPT therapist  competed a mood scale evaluation. The patient spoke about recent changes with her med therapy that so far she is responding to well and is currently helping her manage her MH symptoms as well as regulate her sleep cycle.The patient spoke about her plans for New Years and the start of 2025. The patient spoke about additionally upcoming adjustments for January with kids returning to school and her adjusting to steady work schedule. The patient overviewed her upcoming health appointments as listed in patients MyChart.   Suicidal/Homicidal: Nowithout intent/plan   Therapist Response: The OPT therapist worked with the patient for the patients scheduled session. The patient was engaged in her session and gave feedback in relation to triggers, symptoms, and behavior responses over the past few weeks. The patient spoke about changes with health situations for her son, daughter, and husband including herself a ED visit for her a hand injury sustained while scraping metal which she was treated for but did not need stiches for and has recovered from. The patient spoke about starting a new job and being excited about having a stream of income pushing further toward her goal of moving sometime in 2025. The OPT therapist worked with the patient utilizing an in session Cognitive Behavioral Therapy exercise. The patient was responsive in the session and verbalized, Even though we have been through a lot over the past few weeks I am excited and my mood is good going into the next year. The OPT therapist worked with the patient on herself fact checking and not allowing others to negatively impact her functioning. The OPT gave support and encouragement with the patient who will work on using coping strategies with consistency and being aware  of her baseline and working on ongoing consistency in meeting her treatment goals with consistency. The patient spoke about looking forward to positive changes in 2025. The OPT therapist  will continue treatment work with the patient in her next scheduled session.   Plan: Return again in 2/3 weeks.   Diagnosis:      Axis I:  Recurrent moderate MDD with Anxiety                         Axis II: No diagnosis   Collaboration of Care: None   Patient/Guardian was advised Release of Information must be obtained prior to any record release in order to collaborate their care with an outside provider. Patient/Guardian was advised if they have not already done so to contact the registration department to sign all necessary forms in order for us  to release information regarding their care.    Consent: Patient/Guardian gives verbal consent for treatment and assignment of benefits for services provided during this visit. Patient/Guardian expressed understanding and agreed to proceed.        I discussed the assessment and treatment plan with the patient. The patient was provided an opportunity to ask questions and all were answered. The patient agreed with the plan and demonstrated an understanding of the instructions.   The patient was advised to call back or seek an in-person evaluation if the symptoms worsen or if the condition fails to improve as anticipated.   I provided 45 minutes of non-face-to-face time during this encounter.   Jerel Pepper, LCSW   07/23/2023

## 2023-07-28 ENCOUNTER — Other Ambulatory Visit: Payer: Self-pay | Admitting: Family Medicine

## 2023-07-28 DIAGNOSIS — F339 Major depressive disorder, recurrent, unspecified: Secondary | ICD-10-CM

## 2023-07-28 DIAGNOSIS — F419 Anxiety disorder, unspecified: Secondary | ICD-10-CM

## 2023-08-06 ENCOUNTER — Ambulatory Visit (HOSPITAL_COMMUNITY): Payer: Medicaid Other | Admitting: Clinical

## 2023-08-09 ENCOUNTER — Encounter: Payer: Self-pay | Admitting: Family Medicine

## 2023-08-09 ENCOUNTER — Telehealth: Payer: Self-pay | Admitting: Family Medicine

## 2023-08-09 NOTE — Telephone Encounter (Signed)
Copied from CRM [id number]. Topic: General - Other >> Aug 09, 2023  8:13 AM Desma Mcgregor wrote: Reason for CRM: Patient needs a letter stating that she has seizures. Has to be at work at [employer]. Please f/u asap

## 2023-08-09 NOTE — Telephone Encounter (Signed)
Letter has been sent. Made pt aware in mychart. Offered to print and come pick up note as well.

## 2023-08-12 ENCOUNTER — Ambulatory Visit (HOSPITAL_COMMUNITY): Payer: Medicaid Other | Admitting: Clinical

## 2023-08-13 ENCOUNTER — Other Ambulatory Visit: Payer: Self-pay | Admitting: Obstetrics and Gynecology

## 2023-08-13 ENCOUNTER — Ambulatory Visit (INDEPENDENT_AMBULATORY_CARE_PROVIDER_SITE_OTHER): Payer: Medicaid Other | Admitting: Clinical

## 2023-08-13 DIAGNOSIS — F331 Major depressive disorder, recurrent, moderate: Secondary | ICD-10-CM | POA: Diagnosis not present

## 2023-08-13 DIAGNOSIS — F419 Anxiety disorder, unspecified: Secondary | ICD-10-CM | POA: Diagnosis not present

## 2023-08-13 NOTE — Progress Notes (Signed)
Virtual Visit via Video Note   I connected with Jaime Flores. [name] on 08/13/23 at  9:00 AM EST by a video enabled telemedicine application and verified that I am speaking with the correct person using two identifiers.   Location: Patient: Home Provider: Office   I discussed the limitations of evaluation and management by telemedicine and the availability of in person appointments. The patient expressed understanding and agreed to proceed.     THERAPIST PROGRESS NOTE   Session Time: 9:00 AM- 9:45 AM   Participation Level: Active   Behavioral Response: CasualAlertDepressed   Type of Therapy: Individual Therapy   Treatment Goals addressed: Coping for Anxiety and mood management   Interventions: CBT, Motivational Interviewing, Solution Focused and Strength-based   Summary: Jaime Flores. Jaime Flores is a 38 y.o. female who presents with Depression and Anxiety . The OPT therapist worked with the patient for her OPT treatment. The OPT therapist utilized Motivational Interviewing to assist in creating therapeutic repore. The patient in the session was engaged and work in collaboration giving feedback about her past few weeks. The patient spoke about changes with recently starting a new job and working to adjust to her schedule change. The patient spoke about feeling better about her job but also communicating with her supervisor she feels she could benefit from ongoing training.The OPT therapist utilized Cognitive Behavioral Therapy through cognitive restructuring as well as worked with the patient on coping strategies to assist in management of mood. The OPT therapist worked with the patient on self check ins and utilizing her support network. The OPT therapist overviewed coping strategies for  Winter season. The OPT therapist competed a mood scale evaluation. The patient spoke about recent changes with her med therapy that so far she is responding to well and is currently helping her manage her MH  symptoms as well as regulate her sleep cycle.The patient spoke about her interaction and communication with her family members. The patient overviewed her upcoming health appointments as listed in patients MyChart.   Suicidal/Homicidal: Nowithout intent/plan   Therapist Response: The OPT therapist worked with the patient for the patients scheduled session. The patient was engaged in her session and gave feedback in relation to triggers, symptoms, and behavior responses over the past few weeks. The patient spoke about ongoing adjustment to her new job and being excited about having a stream of income. The OPT therapist worked with the patient utilizing an in session Cognitive Behavioral Therapy exercise. The patient was responsive in the session and verbalized, "I  think you will be happy with this so I did take your advice and I have got a planner and I have been using it and it has inspirational quotes and is helping me to time manage better". The OPT therapist worked with the patient on herself fact checking and not allowing others to negatively impact her functioning. The OPT gave support and encouragement with the patient who will work on using coping strategies with consistency and being aware of her baseline and working on ongoing consistency in meeting her treatment goals with consistency. The patient spoke about incorporating a positive mindset  and utilizing the in my control vs out of my control exercise in her everyday life to help manage stressors. The OPT therapist will continue treatment work with the patient in her next scheduled session.   Plan: Return again in 2/3 weeks.   Diagnosis:      Axis I:  Recurrent moderate MDD with Anxiety  Axis II: No diagnosis   Collaboration of Care: No additional collaboration for this session.   Patient/Guardian was advised Release of Information must be obtained prior to any record release in order to collaborate their care with an  outside provider. Patient/Guardian was advised if they have not already done so to contact the registration department to sign all necessary forms in order for [place] to release information regarding their care.    Consent: Patient/Guardian gives verbal consent for treatment and assignment of benefits for services provided during this visit. Patient/Guardian expressed understanding and agreed to proceed.        I discussed the assessment and treatment plan with the patient. The patient was provided an opportunity to ask questions and all were answered. The patient agreed with the plan and demonstrated an understanding of the instructions.   The patient was advised to call back or seek an in-person evaluation if the symptoms worsen or if the condition fails to improve as anticipated.   I provided 45 minutes of non-face-to-face time during this encounter.   Jaime Garibaldi, LCSW   08/13/2023

## 2023-08-13 NOTE — Patient Outreach (Signed)
Medicaid Managed Care   Nurse Care Manager Note  08/13/2023 Name:  Jaime Flores MRN:  [id number] DOB:  05-12-1986  Jaime Flores is an 38 y.o. year old female who is a primary patient of [name], Elige Radon, MD.  The [facility] Managed Care Coordination team was consulted for assistance with:    Chronic healthcare management needs, tobacco use, anxiety/depression/BP, PNES  Jaime Flores was given information about Medicaid Managed Care Coordination team services today. Jaime Flores Patient agreed to services and verbal consent obtained.  Engaged with patient by telephone for follow up visit in response to provider referral for case management and/or care coordination services.   Patient is participating in a Managed Medicaid Plan:  Yes  Assessments/Interventions:  Review of past medical history, allergies, medications, health status, including review of consultants reports, laboratory and other test data, was performed as part of comprehensive evaluation and provision of chronic care management services.  SDOH (Social Drivers of Health) assessments and interventions performed: SDOH Interventions    Flowsheet Row Patient Outreach Telephone from 08/13/2023 in [place] [facility] DEPART[facility] Patient Outreach Telephone from 07/11/2023 in [place] POPULATION HEALTH DEPARTMENT Patient Outreach Telephone from 06/13/2023 in [place] POPULATION HEALTH DEPARTMENT Office Visit from 06/10/2023 in [facility] Patient Outreach Telephone from 05/16/2023 in [place] POPULATION HEALTH DEPARTMENT Patient Outreach Telephone from 04/29/2023 in  POPULATION HEALTH DEPARTMENT  SDOH Interventions        Housing Interventions Intervention Not Indicated -- -- -- -- Intervention Not Indicated  Utilities Interventions -- Intervention Not Indicated -- -- -- --  Alcohol Usage Interventions -- Intervention Not Indicated (Score <7) -- -- -- --   Depression Interventions/Treatment  -- -- -- Medication, Currently on Treatment -- --  Physical Activity Interventions Intervention Not Indicated -- -- -- -- --  Stress Interventions -- -- -- -- [employer], Provide Counseling  [Recent seizures] --  Social Connections Interventions -- -- -- -- -- Intervention Not Indicated  Health Literacy Interventions -- -- Intervention Not Indicated -- -- --     Care Plan Allergies  Allergen Reactions   Catfish [Fish Allergy] Shortness Of Breath   Doxycycline Shortness Of Breath   Wellbutrin [Bupropion] Shortness Of Breath, Nausea Only and Palpitations   Tape Swelling    Plastic tape please use paper   Medications Reviewed Today     Reviewed by [name], RN (Registered Nurse) on 08/13/23 at 1437  Med List Status: <None>   Medication Order Taking? Sig Documenting Provider Last Dose Status Informant  cyclobenzaprine (FLEXERIL) 10 MG tablet [id number]  TAKE (1) TABLET BY MOUTH (3) TIMES DAILY AS NEEDED FOR MUSCLE SPASMS. [name], Elige Radon, MD  Active   lamoTRIgine (LAMICTAL) 25 MG tablet [id number]  Take 1 tablet (25 mg total) by mouth daily. [name], Elige Radon, MD  Active   mupirocin ointment (BACTROBAN) 2 % [id number]  Apply 1 Application topically 2 (two) times daily. Leath-Warren, Sadie Haber, NP  Active   predniSONE (DELTASONE) 20 MG tablet [id number]  2 po at same time daily for 5 days [name], Elige Radon, MD  Active   QUEtiapine (SEROQUEL) 50 MG tablet [id number]  Take 1 tablet (50 mg total) by mouth at bedtime. [name], Elige Radon, MD  Active   sertraline (ZOLOFT) 100 MG tablet [id number]  Take 1.5 tablets (150 mg total) by mouth daily. [name], Elige Radon, MD  Active  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   Prediabetes 02/15/2020   Anxiety 11/18/2019   Depression, recurrent (HCC) 11/18/2019   Seasonal and perennial allergic  rhinitis 05/15/2019   Amenorrhea 01/27/2013   Conditions to be addressed/monitored per PCP order:  Chronic healthcare management needs, tobacco use, anxiety/depression/BP, PNES  Care Plan : RN Care Manager Plan of Care  Updates made by [name], RN since 08/13/2023 12:00 AM     Problem: Health Promotion or Disease Self-Management (General Plan of Care)      Long-Range Goal: Chronic Disease Management   Start Date: 08/23/2022  Expected End Date: 10/09/2023  Priority: High  Note:   Current Barriers:  Knowledge Deficits related to plan of care for management of Anxiety, Depression, allergic rhinitis, and PNES, tobacco use  Chronic Disease Management support and education needs related to Anxiety, Depression, Allergic Rhinitis, and PNES, tobacco use  08/13/23: 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  Continues CBT.  Smokes 1/2 ppd.  Patient with concerns regarding experience with counselor office-phone receptionist "rude".  Daughter to have surgery for scoliosis 02/11/24 at [facility] Clinical Goal(s):  Patient will verbalize understanding of plan for management of Anxiety, Depression, Allergic Rhinitis, and PNES, tobacco use  as evidenced by patient report verbalize basic understanding of  Anxiety, Depression, Allergic Rhinitis, and PNES, tobacco use  disease process and self health management plan as evidenced by patient report take all medications exactly as prescribed and will call provider for medication related questions as evidenced by patient report demonstrate understanding of rationale for each prescribed medication as evidenced by patient report attend all scheduled medical appointments as evidenced by patient report and EMR review demonstrate Ongoing adherence to prescribed treatment plan for Anxiety, Depression, Allergic Rhinitis, and PNES, tobacco use as evidenced by patient report and EMR review continue to work with RN Care  Manager to address care management and care coordination needs related to  Anxiety, Depression, Allergic Rhinitis, and PNES as evidenced by adherence to CM Team Scheduled appointments through collaboration with RN Care manager, provider, and care team.   Interventions: Inter-disciplinary care team collaboration (see longitudinal plan of care) Evaluation of current treatment plan related to  self management and patient's adherence to plan as established by provider Collaborated with LCSW LCSW referral for CBT-completed 08/13/23:  Patient given Office of Patient Experience phone number to voice concerns as unable to determine who scheduled  appt for patient at counselor's office that was 'rude" to patient.  RNCM enlisted help of Mervin Hack and Weston Settle to determine appt details.  Smoking Cessation Interventions:  (Status:  New goal.) Long Term Goal Reviewed smoking history:  ; currently smoking 1/2 ppd On a scale of 1-10, reports MOTIVATION to quit is 1 On a scale of 1-10, reports CONFIDENCE in quitting is 1 09/24/22:  Patient is not interested in stopping right now.  Evaluation of current treatment plan reviewed Advised patient to discuss smoking cessation options with provider Provided contact information for [place] Quit Line (1-800-QUIT-NOW) Discussed plans with patient for ongoing care management follow up and provided patient with direct contact information for care management team Assessed social determinant of health barriers     (Status:  New goal.)  Long Term Goal Evaluation of current treatment plan related to Anxiety, Depression, Allergic Rhinitis, and PNES , self-management and patient's adherence to plan as established  by provider. Discussed plans with patient for ongoing care management follow up and provided patient with direct contact information for care management team Evaluation of current treatment plan related to anxiety, depression, rhinitis, PNES  and patient's adherence  to plan as established by provider Reviewed medications with patient Reviewed scheduled/upcoming provider appointments  Discussed plans with patient for ongoing care management follow up and provided patient with direct contact information for care management team Assessed social determinant of health barriers  Patient Goals/Self-Care Activities: Take all medications as prescribed Attend all scheduled provider appointments Call pharmacy for medication refills 3-7 days in advance of running out of medications Perform all self care activities independently  Perform IADL's (shopping, preparing meals, housekeeping, managing finances) independently Call provider office for new concerns or questions  07/11/23:  Schedule f/u CBT appt.-completed  Follow Up Plan:  The patient has been provided with contact information for the care management team and has been advised to call with any health related questions or concerns.  The care management team will reach out to the patient again over the next 45 business  days.    Long-Range Goal: Establish Plan of Care for Chronic Disease Management Needs   Priority: High  Note:   Timeframe:  Long-Range Goal Priority:  High Start Date: 08/23/22                            Expected End Date: ongoing                      Follow Up Date 09/13/23   - practice safe sex - schedule appointment for flu shot - schedule appointment for vaccines needed due to my age or health - schedule recommended health tests (blood work, mammogram, colonoscopy, pap test) - schedule and keep appointment for annual check-up    Why is this important?   Screening tests can find diseases early when they are easier to treat.  Your doctor or nurse will talk with you about which tests are important for you.  Getting shots for common diseases like the flu and shingles will help prevent them.  08/13/23: NEURO f/u in February.   Follow Up:  Patient agrees to Care Plan and  Follow-up.  Plan: The Managed Medicaid care management team will reach out to the patient again over the next 30 business  days. and The  Patient has been provided with contact information for the Managed Medicaid care management team and has been advised to call with any health related questions or concerns.  Date/time of next scheduled RN care management/care coordination outreach:  09/13/23 at 230

## 2023-08-13 NOTE — Patient Instructions (Signed)
Hi Jaime Flores hope things improve-keep me posted.  Have a good afternoon!  Jaime Flores was given information about Medicaid Managed Care team care coordination services as a part of their Healthy [facility] Medicaid benefit. Jaime Flores verbally consented to engagement with the [facility] Managed Care team.   If you are experiencing a medical emergency, please call 911 or report to your local emergency department or urgent care.   If you have a non-emergency medical problem during routine business hours, please contact your provider's office and ask to speak with a nurse.   For questions related to your Healthy [facility] health plan, please call: [phone] or visit the homepage here: [email or web address]  If you would like to schedule transportation through your Healthy [facility] plan, please call the following number at least 2 days in advance of your appointment: [phone]  For information about your ride after you set it up, call Ride Assist at [phone]. Use this number to activate a Will Call pickup, or if your transportation is late for a scheduled pickup. Use this number, too, if you need to make a change or cancel a previously scheduled reservation.  If you need transportation services right away, call [phone]. The after-hours call center is staffed 24 hours to handle ride assistance and urgent reservation requests (including discharges) 365 days a year. Urgent trips include sick visits, hospital discharge requests and life-sustaining treatment.  Call the [facility] Line at [phone], at any time, 24 hours a day, 7 days a week. If you are in danger or need immediate medical attention call 911.  If you would like help to quit smoking, call 1-800-QUIT-NOW (959 264 0832) OR Espaol: 1-855-Djelo-Ya (4-166-063-0160) o para ms informacin haga clic aqu or Text READY to 109-323 to register via text  Jaime Flores -  following are the goals we discussed in your visit today:   Goals Addressed    Timeframe:  Long-Range Goal Priority:  High Start Date: 08/23/22                            Expected End Date: ongoing                      Follow Up Date 09/13/23   - practice safe sex - schedule appointment for flu shot - schedule appointment for vaccines needed due to my age or health - schedule recommended health tests (blood work, mammogram, colonoscopy, pap test) - schedule and keep appointment for annual check-up    Why is this important?   Screening tests can find diseases early when they are easier to treat.  Your doctor or nurse will talk with you about which tests are important for you.  Getting shots for common diseases like the flu and shingles will help prevent them.  08/13/23: NEURO f/u in February.  Patient verbalizes understanding of instructions and care plan provided today and agrees to view in MyChart. Active MyChart status and patient understanding of how to access instructions and care plan via MyChart confirmed with patient.     The Managed Medicaid care management team will reach out to the patient again over the next 30 business  days.  The  Patient  has been provided with contact information for the Managed Medicaid care management team and has been advised to call with any health related questions or concerns.   [name] RN, BSN, [employer] [facility] [employer] [facility] RN Care Manager  Direct Dial [phone] Website: [email or web address]   Following is a copy of your plan of care:  Care Plan : RN Care Manager Plan of Care  Updates made by [name], RN since 08/13/2023 12:00 AM     Problem: Health Promotion or Disease Self-Management (General Plan of Care)      Long-Range Goal: Chronic Disease Management   Start Date: 08/23/2022  Expected End Date: 10/09/2023  Priority: High  Note:   Current Barriers:  Knowledge Deficits related to plan of care  for management of Anxiety, Depression, allergic rhinitis, and PNES, tobacco use  Chronic Disease Management support and education needs related to Anxiety, Depression, Allergic Rhinitis, and PNES, tobacco use  08/13/23: 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  Continues CBT.  Smokes 1/2 ppd.  Patient with concerns regarding experience with counselor office-phone receptionist "rude".  Daughter to have surgery for scoliosis 02/11/24 at [facility] Clinical Goal(s):  Patient will verbalize understanding of plan for management of Anxiety, Depression, Allergic Rhinitis, and PNES, tobacco use  as evidenced by patient report verbalize basic understanding of  Anxiety, Depression, Allergic Rhinitis, and PNES, tobacco use  disease process and self health management plan as evidenced by patient report take all medications exactly as prescribed and will call provider for medication related questions as evidenced by patient report demonstrate understanding of rationale for each prescribed medication as evidenced by patient report attend all scheduled medical appointments as evidenced by patient report and EMR review demonstrate Ongoing adherence to prescribed treatment plan for Anxiety, Depression, Allergic Rhinitis, and PNES, tobacco use as evidenced by patient report and EMR review continue to work with RN Care Manager to address care management and care coordination needs related to  Anxiety, Depression, Allergic Rhinitis, and PNES as evidenced by adherence to CM Team Scheduled appointments through collaboration with RN Care manager, provider, and care team.   Interventions: Inter-disciplinary care team collaboration (see longitudinal plan of care) Evaluation of current treatment plan related to  self management and patient's adherence to plan as established by provider Collaborated with LCSW LCSW referral for CBT-completed 08/13/23:  Patient given Office of  Patient Experience phone number to voice concerns as unable to determine who scheduled  appt for patient at counselor's office that was 'rude" to patient.  RNCM enlisted help of Mervin Hack and Weston Settle to determine appt details.  Smoking Cessation Interventions:  (Status:  New goal.) Long Term Goal Reviewed smoking history:  ; currently smoking 1/2 ppd On a scale of 1-10, reports MOTIVATION to quit is 1 On a scale of 1-10, reports CONFIDENCE in quitting is 1 09/24/22:  Patient is not interested in stopping right now.  Evaluation of current treatment plan reviewed Advised patient to discuss smoking cessation options with provider Provided contact information for [place] Quit Line (1-800-QUIT-NOW) Discussed plans with patient for ongoing care management follow up and provided patient with direct contact information for care management team Assessed social determinant of health barriers     (Status:  New goal.)  Long Term Goal Evaluation of current treatment plan related to Anxiety, Depression, Allergic Rhinitis, and PNES , self-management and patient's adherence to plan as established by provider. Discussed plans with patient for ongoing care management follow up and provided patient with direct contact information for care management team Evaluation of current treatment plan related to anxiety, depression, rhinitis, PNES  and patient's adherence to plan as established by provider Reviewed medications  with patient Reviewed scheduled/upcoming provider appointments  Discussed plans with patient for ongoing care management follow up and provided patient with direct contact information for care management team Assessed social determinant of health barriers  Patient Goals/Self-Care Activities: Take all medications as prescribed Attend all scheduled provider appointments Call pharmacy for medication refills 3-7 days in advance of running out of medications Perform all self care activities  independently  Perform IADL's (shopping, preparing meals, housekeeping, managing finances) independently Call provider office for new concerns or questions  07/11/23:  Schedule f/u CBT appt.-completed  Follow Up Plan:  The patient has been provided with contact information for the care management team and has been advised to call with any health related questions or concerns.  The care management team will reach out to the patient again over the next 45 business  days.

## 2023-09-02 ENCOUNTER — Ambulatory Visit: Payer: Medicaid Other | Admitting: Family Medicine

## 2023-09-02 ENCOUNTER — Encounter: Payer: Self-pay | Admitting: Family Medicine

## 2023-09-02 VITALS — BP 113/66 | HR 68 | Ht 62.0 in | Wt 161.0 lb

## 2023-09-02 DIAGNOSIS — F339 Major depressive disorder, recurrent, unspecified: Secondary | ICD-10-CM | POA: Diagnosis not present

## 2023-09-02 DIAGNOSIS — Z13220 Encounter for screening for lipoid disorders: Secondary | ICD-10-CM

## 2023-09-02 DIAGNOSIS — R7303 Prediabetes: Secondary | ICD-10-CM | POA: Diagnosis not present

## 2023-09-02 DIAGNOSIS — F419 Anxiety disorder, unspecified: Secondary | ICD-10-CM

## 2023-09-02 MED ORDER — QUETIAPINE FUMARATE ER 150 MG PO TB24: 150.0000 mg | ORAL_TABLET | Freq: Every day | ORAL | 1 refills | Status: DC

## 2023-09-02 NOTE — Progress Notes (Signed)
 BP 113/66   Pulse 68   Ht 5\' 2"  (1.575 m)   Wt 161 lb (73 kg)   SpO2 100%   BMI 29.45 kg/m    Subjective:   Patient ID: Jaime Flores, female    DOB: 1986-05-28, 38 y.o.   MRN: [id number]  HPI: Jaime Flores is a 38 y.o. female presenting on 09/02/2023 for Medical Management of Chronic Issues, Anxiety, and depression   HPI Anxiety and depression and pseudoseizures Patient is coming in for recheck of anxiety and depression and pseudoseizures.  She currently takes Seroquel  and Zoloft .  Patient is coming in today with complaints of having her pseudoseizures again and not as well with her stress and anxiety.  She says she has had a lot of issues with her marital status and said that her husband woke up in the middle night feeling they rolled over to her and asked her why she does not love him anymore.  She also says the couple of their joint kids are in child protective services currently and he refuses to stop smoking marijuana and the need to find a more stable living situation so they can get their children back and that has caused some stress.  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  She says her job at [employer] is also stressful.  Patient stopped the lamotrigine  because she feels like it caused her more mood swings.    09/02/2023   10:41 AM 06/10/2023    4:05 PM 04/08/2023    1:31 PM 12/04/2022    1:39 PM 09/26/2022    2:02 PM  Depression screen PHQ 2/9  Decreased Interest 2 3  0   Down, Depressed, Hopeless 2 3  0   PHQ - 2 Score 4 6  0   Altered sleeping 3 3  2 2   Tired, decreased energy 3 3  2 2   Change in appetite 1 3  0 2  Feeling bad or failure about yourself  1 3  0 0  Trouble concentrating 0 3  0 0  Moving slowly or fidgety/restless 0 0  0 1  Suicidal thoughts 0 1  0 0  PHQ-9 Score 12 22  4    Difficult doing work/chores Very difficult Very difficult   Somewhat difficult     Information  is confidential and restricted. Go to Review Flowsheets to unlock data.     Relevant past medical, surgical, family and social history reviewed and updated as indicated. Interim medical history since our last visit reviewed. Allergies and medications reviewed and updated.  Review of Systems  Constitutional:  Negative for chills and fever.  Eyes:  Negative for visual disturbance.  Respiratory:  Negative for chest tightness and shortness of breath.   Cardiovascular:  Negative for chest pain and leg swelling.  Genitourinary:  Negative for difficulty urinating and dysuria.  Skin:  Negative for rash.  Neurological:  Negative for dizziness, light-headedness and headaches.  Psychiatric/Behavioral:  Positive for dysphoric mood and sleep disturbance. Negative for agitation, behavioral problems, self-injury and suicidal ideas. The patient is nervous/anxious.   All other systems reviewed and are negative.   Per HPI unless specifically indicated above   Allergies as of 09/02/2023       Reactions   Catfish [fish Allergy ] Shortness Of Breath   Doxycycline Shortness Of Breath   Wellbutrin  [bupropion ] Shortness Of Breath, Nausea Only, Palpitations   Tape Swelling  Plastic tape please use paper        Medication List        Accurate as of September 02, 2023 11:41 AM. If you have any questions, ask your nurse or doctor.          STOP taking these medications    lamoTRIgine  25 MG tablet Commonly known as: LAMICTAL  Stopped by: Lucio Sabin Farah Lepak   mupirocin  ointment 2 % Commonly known as: BACTROBAN  Stopped by: Lucio Sabin Thornton Dohrmann   predniSONE  20 MG tablet Commonly known as: DELTASONE  Stopped by: Lucio Sabin Tiyanna Larcom       TAKE these medications    cyclobenzaprine  10 MG tablet Commonly known as: FLEXERIL  TAKE (1) TABLET BY MOUTH (3) TIMES DAILY AS NEEDED FOR MUSCLE SPASMS.   QUEtiapine  50 MG tablet Commonly known as: SEROquel  Take 1 tablet (50 mg total) by mouth at  bedtime. What changed: Another medication with the same name was added. Make sure you understand how and when to take each. Changed by: Lucio Sabin Melanni Benway   QUEtiapine  Fumarate 150 MG 24 hr tablet Commonly known as: SEROquel  XR Take 1 tablet (150 mg total) by mouth at bedtime. What changed: You were already taking a medication with the same name, and this prescription was added. Make sure you understand how and when to take each. Changed by: Lucio Sabin Abel Hageman   sertraline  100 MG tablet Commonly known as: ZOLOFT  Take 1.5 tablets (150 mg total) by mouth daily.         Objective:   BP 113/66   Pulse 68   Ht 5\' 2"  (1.575 m)   Wt 161 lb (73 kg)   SpO2 100%   BMI 29.45 kg/m   Wt Readings from Last 3 Encounters:  09/02/23 161 lb (73 kg)  06/10/23 158 lb (71.7 kg)  02/14/23 156 lb (70.8 kg)    Physical Exam Vitals and nursing note reviewed.  Constitutional:      General: She is not in acute distress.    Appearance: She is well-developed. She is not diaphoretic.  Eyes:     Conjunctiva/sclera: Conjunctivae normal.  Cardiovascular:     Rate and Rhythm: Normal rate and regular rhythm.     Heart sounds: Normal heart sounds. No murmur heard. Pulmonary:     Effort: Pulmonary effort is normal. No respiratory distress.     Breath sounds: Normal breath sounds. No wheezing.  Musculoskeletal:        General: No tenderness. Normal range of motion.  Skin:    General: Skin is warm and dry.     Findings: No rash.  Neurological:     Mental Status: She is alert and oriented to person, place, and time.     Coordination: Coordination normal.  Psychiatric:        Mood and Affect: Mood is anxious and depressed.        Behavior: Behavior normal.        Thought Content: Thought content does not include suicidal ideation. Thought content does not include suicidal plan.       Assessment & Plan:   Problem List Items Addressed This Visit       Other   Anxiety - Primary   Relevant  Medications   QUEtiapine  Fumarate (SEROQUEL  XR) 150 MG 24 hr tablet   Other Relevant Orders   CMP14+EGFR   TSH   CBC with Differential/Platelet   Depression, recurrent (HCC)   Relevant Orders   CMP14+EGFR   TSH   CBC  with Differential/Platelet   Prediabetes   Relevant Orders   Bayer DCA Hb A1c Waived   Other Visit Diagnoses       Lipid screening       Relevant Orders   CMP14+EGFR   Lipid panel     Take 2 Seroquel  50 mg tablets together at night for the next 2 days for a total of 100 mg and then go up to taking Seroquel  150 mg extended release for couple days and then if needed she can go back to taking her Seroquel  150 mg extended release with her 50 mg short acting after that and follow-up in 2 to 3 weeks  Follow up plan: Return if symptoms worsen or fail to improve, for 2 to 3 weeks recheck anxiety depression.  Counseling provided for all of the vaccine components Orders Placed This Encounter  Procedures   CMP14+EGFR   Lipid panel   TSH   Bayer DCA Hb A1c Waived   CBC with Differential/Platelet    Jolyne Needs, MD [facility] 09/02/2023, 11:41 AM

## 2023-09-03 ENCOUNTER — Ambulatory Visit: Payer: Medicaid Other | Admitting: Neurology

## 2023-09-11 ENCOUNTER — Ambulatory Visit (INDEPENDENT_AMBULATORY_CARE_PROVIDER_SITE_OTHER): Payer: Medicaid Other | Admitting: Clinical

## 2023-09-11 DIAGNOSIS — F419 Anxiety disorder, unspecified: Secondary | ICD-10-CM

## 2023-09-11 DIAGNOSIS — F331 Major depressive disorder, recurrent, moderate: Secondary | ICD-10-CM

## 2023-09-11 NOTE — Progress Notes (Signed)
 Virtual Visit via Video Note   I connected with Jaime Flores on 09/11/23 at  10:00 AM EST by a video enabled telemedicine application and verified that I am speaking with the correct person using two identifiers.   Location: Patient: Home Provider: Office   I discussed the limitations of evaluation and management by telemedicine and the availability of in person appointments. The patient expressed understanding and agreed to proceed.     THERAPIST PROGRESS NOTE   Session Time: 10:00 AM- 10:45 AM   Participation Level: Active   Behavioral Response: CasualAlertDepressed   Type of Therapy: Individual Therapy   Treatment Goals addressed: Coping for Anxiety and mood management   Interventions: CBT, Motivational Interviewing, Solution Focused and Strength-based   Summary: Jaime Flores is a 38 y.o. female who presents with Depression and Anxiety . The OPT therapist worked with the patient for her OPT treatment. The OPT therapist utilized Motivational Interviewing to assist in creating therapeutic repore. The patient in the session was engaged and work in collaboration giving feedback about her past few weeks. The patient spoke about changes including getting a new puppy of the same breed as a pet she lost. The patient spoke about having ongoing difficulty with her employer due to her having seizure activity while at work and the patient now notes that she is having difficulty with her employer scheduling her/giving her hours.The OPT therapist utilized Cognitive Behavioral Therapy through cognitive restructuring as well as worked with the patient on coping strategies to assist in management of mood. The OPT therapist worked with the patient on self check ins and utilizing her support network. The OPT therapist overviewed coping strategies for  Winter season. The OPT therapist competed a mood scale evaluation. The patient spoke about recent changes with her med therapy that so far she is  responding to well and is currently helping her manage her MH symptoms as well as regulate her sleep cycle.The patient spoke about her interaction and communication with her family members. The patient spoke about current inclement weather predicted for today. The patient spoke about getting and utilizing a planner and this helping with task completion and time management. The patient overviewed her upcoming health appointments as listed in patients MyChart.   Suicidal/Homicidal: Nowithout intent/plan   Therapist Response: The OPT therapist worked with the patient for the patients scheduled session. The patient was engaged in her session and gave feedback in relation to triggers, symptoms, and behavior responses over the past few weeks. The patient spoke about ongoing adjustment to her  job having difficulty since having some at work seizure activity in getting enough hours lately. The OPT therapist worked with the patient utilizing an in session Cognitive Behavioral Therapy exercise. The patient was responsive in the session and verbalized, "I did something since I seen you last and wanted to show you today to help me and my husbands mood... so we bought a new puppy". The OPT therapist worked with the patient on herself fact checking and not allowing others to negatively impact her functioning.  The patient spoke about the impact of being out of work and her plan to manage finances utilizing government programs to help cover costs.The OPT gave support and encouragement with the patient who will work on using coping strategies with consistency and being aware of her baseline and working on ongoing consistency in meeting her treatment goals with consistency. The patient spoke about incorporating a positive mindset  and utilizing the in my control vs  out of my control exercise in her everyday life to help manage stressors. The OPT therapist will continue treatment work with the patient in her next scheduled  session.   Plan: Return again in 2/3 weeks.   Diagnosis:      Axis I:  Recurrent moderate MDD with Anxiety                         Axis II: No diagnosis   Collaboration of Care: No additional collaboration for this session.   Patient/Guardian was advised Release of Information must be obtained prior to any record release in order to collaborate their care with an outside provider. Patient/Guardian was advised if they have not already done so to contact the registration department to sign all necessary forms in order for [place] to release information regarding their care.    Consent: Patient/Guardian gives verbal consent for treatment and assignment of benefits for services provided during this visit. Patient/Guardian expressed understanding and agreed to proceed.        I discussed the assessment and treatment plan with the patient. The patient was provided an opportunity to ask questions and all were answered. The patient agreed with the plan and demonstrated an understanding of the instructions.   The patient was advised to call back or seek an in-person evaluation if the symptoms worsen or if the condition fails to improve as anticipated.   I provided 45 minutes of non-face-to-face time during this encounter.   Suzan Garibaldi, LCSW   09/11/2023

## 2023-09-13 ENCOUNTER — Other Ambulatory Visit: Payer: Self-pay | Admitting: Obstetrics and Gynecology

## 2023-09-13 NOTE — Patient Outreach (Signed)
 Medicaid Managed Care   Nurse Care Manager Note  09/13/2023 Name:  Jaime Flores MRN:  [id number] DOB:  1985-08-19  Jaime Flores is an 38 y.o. year old female who is a primary patient of [name], Elige Radon, MD.  The [facility] Managed Care Coordination team was consulted for assistance with:    Chronic case management healthcare tobacco use, anxiety/depression/BP, PNES  [name] was given information about Medicaid Managed Care Coordination team services today. Jaime Flores Patient agreed to services and verbal consent obtained.  Engaged with patient by telephone for follow up visit in response to provider referral for case management and/or care coordination services.   Patient is participating in a Managed Medicaid Plan:  Yes  Assessments/Interventions:  Review of past medical history, allergies, medications, health status, including review of consultants reports, laboratory and other test data, was performed as part of comprehensive evaluation and provision of chronic care management services.  SDOH (Social Drivers of Health) assessments and interventions performed: SDOH Interventions    Flowsheet Row Patient Outreach Telephone from 09/13/2023 in [place] [facility] Office Visit from 09/02/2023 in [facility] Patient Outreach Telephone from 08/13/2023 in [place] [facility] Patient Outreach Telephone from 07/11/2023 in [place] [facility] Patient Outreach Telephone from 06/13/2023 in [place] [facility]PART[facility] Office Visit from 06/10/2023 in [facility]  SDOH Interventions        Housing Interventions -- -- Intervention Not Indicated -- -- --  Utilities Interventions -- -- -- Intervention Not Indicated -- --  Alcohol Usage Interventions -- -- -- Intervention Not Indicated (Score <7) -- --  Depression Interventions/Treatment  --  Medication, Currently on Treatment -- -- -- Medication, Currently on Treatment  Physical Activity Interventions -- -- Intervention Not Indicated -- -- --  Health Literacy Interventions Intervention Not Indicated -- -- -- Intervention Not Indicated --     Care Plan Allergies  Allergen Reactions   Catfish [Fish Allergy] Shortness Of Breath   Doxycycline Shortness Of Breath   Wellbutrin [Bupropion] Shortness Of Breath, Nausea Only and Palpitations   Tape Swelling    Plastic tape please use paper   Medications Reviewed Today     Reviewed by [name], RN (Registered Nurse) on 09/13/23 at 1451  Med List Status: <None>   Medication Order Taking? Sig Documenting Provider Last Dose Status Informant  cyclobenzaprine (FLEXERIL) 10 MG tablet [id number] No TAKE (1) TABLET BY MOUTH (3) TIMES DAILY AS NEEDED FOR MUSCLE SPASMS. [name], Elige Radon, MD Taking Active   QUEtiapine (SEROQUEL) 50 MG tablet [id number] No Take 1 tablet (50 mg total) by mouth at bedtime. [name], Elige Radon, MD Taking Active   QUEtiapine Fumarate (SEROQUEL XR) 150 MG 24 hr tablet [id number]  Take 1 tablet (150 mg total) by mouth at bedtime. [name], Elige Radon, MD  Active   sertraline (ZOLOFT) 100 MG tablet [id number] No Take 1.5 tablets (150 mg total) by mouth daily. [name], Elige Radon, MD Taking Active            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   Prediabetes 02/15/2020   Anxiety 11/18/2019   Depression, recurrent (HCC) 11/18/2019   Seasonal and perennial allergic rhinitis 05/15/2019   Amenorrhea 01/27/2013   Conditions to be addressed/monitored per PCP order:  Chronic case management healthcare tobacco use, anxiety/depression/BP, PNES  Care Plan :  RN Care Manager Plan of Care  Updates made by [name], RN since 09/13/2023 12:00 AM     Problem: Health Promotion or Disease Self-Management (General Plan of Care)      Long-Range  Goal: Chronic Disease Management   Start Date: 08/23/2022  Expected End Date: 10/09/2023  Priority: High  Note:   Current Barriers:  Knowledge Deficits related to plan of care for management of Anxiety, Depression, allergic rhinitis, and PNES, tobacco use  Chronic Disease Management support and education needs related to Anxiety, Depression, Allergic Rhinitis, and PNES, tobacco use  09/13/23: Attending CBT.  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  Regular f/u with PCP.  Has NEURO appt in ? March.  Smokes 1/2 ppd.  Job difficulties d/t seizures  RNCM Clinical Goal(s):  Patient will verbalize understanding of plan for management of Anxiety, Depression, Allergic Rhinitis, and PNES, tobacco use  as evidenced by patient report verbalize basic understanding of  Anxiety, Depression, Allergic Rhinitis, and PNES, tobacco use  disease process and self health management plan as evidenced by patient report take all medications exactly as prescribed and will call provider for medication related questions as evidenced by patient report demonstrate understanding of rationale for each prescribed medication as evidenced by patient report attend all scheduled medical appointments as evidenced by patient report and EMR review demonstrate Ongoing adherence to prescribed treatment plan for Anxiety, Depression, Allergic Rhinitis, and PNES, tobacco use as evidenced by patient report and EMR review continue to work with RN Care Manager to address care management and care coordination needs related to  Anxiety, Depression, Allergic Rhinitis, and PNES as evidenced by adherence to CM Team Scheduled appointments through collaboration with RN Care manager, provider, and care team.   Interventions: Inter-disciplinary care team collaboration (see longitudinal plan of care) Evaluation of current treatment plan related to  self management and patient's adherence to plan as established by provider Collaborated with LCSW LCSW  referral for CBT-completed 09/13/23:  patient to reach out to HR/Legal Aid regarding work questions/concerns  Smoking Cessation Interventions:  (Status:  New goal.) Long Term Goal Reviewed smoking history:  ; currently smoking 1/2 ppd On a scale of 1-10, reports MOTIVATION to quit is 1 On a scale of 1-10, reports CONFIDENCE in quitting is 1 09/24/22:  Patient is not interested in stopping right now.  Evaluation of current treatment plan reviewed Advised patient to discuss smoking cessation options with provider Provided contact information for [place] [facility]uit Line (1-800-QUIT-NOW) Discussed plans with patient for ongoing care management follow up and provided patient with direct contact information for care management team Assessed social determinant of health barriers     (Status:  New goal.)  Long Term Goal Evaluation of current treatment plan related to Anxiety, Depression, Allergic Rhinitis, and PNES , self-management and patient's adherence to plan as established by provider. Discussed plans with patient for ongoing care management follow up and provided patient with direct contact information for care management team Evaluation of current treatment plan related to anxiety, depression, rhinitis, PNES  and patient's adherence to plan as established by provider Reviewed medications with patient Reviewed scheduled/upcoming provider appointments  Discussed plans with patient for ongoing care management follow up and provided patient with direct contact information for care management team Assessed social determinant of health barriers  Patient Goals/Self-Care Activities: Take all medications as prescribed Attend all scheduled provider appointments Call pharmacy for medication refills 3-7 days in advance of running out of medications Perform all self care activities  independently  Perform IADL's (shopping, preparing meals, housekeeping, managing finances) independently Call provider office for  new concerns or questions  07/11/23:  Schedule f/u CBT appt.-completed  Follow Up Plan:  The patient has been provided with contact information for the care management team and has been advised to call with any health related questions or concerns.  The care management team will reach out to the patient again over the next 45 business  days.    Long-Range Goal: Establish Plan of Care for Chronic Disease Management Needs   Priority: High  Note:   Timeframe:  Long-Range Goal Priority:  High Start Date: 08/23/22                            Expected End Date: ongoing                      Follow Up Date 10/08/23   - practice safe sex - schedule appointment for flu shot - schedule appointment for vaccines needed due to my age or health - schedule recommended health tests (blood work, mammogram, colonoscopy, pap test) - schedule and keep appointment for annual check-up    Why is this important?   Screening tests can find diseases early when they are easier to treat.  Your doctor or nurse will talk with you about which tests are important for you.  Getting shots for common diseases like the flu and shingles will help prevent them.  09/13/23:  Has PCP appt tomorrow.  Attending CBT.   Follow Up:  Patient agrees to Care Plan and Follow-up.  Plan: The Managed Medicaid care management team will reach out to the patient again over the next 30 business  days. and The  Patient has been provided with contact information for the Managed Medicaid care management team and has been advised to call with any health related questions or concerns.  Date/time of next scheduled RN care management/care coordination outreach:  10/08/23 at 315

## 2023-09-13 NOTE — Patient Instructions (Signed)
 Visit Information  Jaime Flores was given information about Medicaid Managed Care team care coordination services as a part of their Healthy [facility] Medicaid benefit. Jaime Flores verbally consented to engagement with the [facility] Managed Care team.   If you are experiencing a medical emergency, please call 911 or report to your local emergency department or urgent care.   If you have a non-emergency medical problem during routine business hours, please contact your provider's office and ask to speak with a nurse.   For questions related to your Healthy [facility] health plan, please call: [phone] or visit the homepage here: [email or web address]  If you would like to schedule transportation through your Healthy [facility] plan, please call the following number at least 2 days in advance of your appointment: [phone]  For information about your ride after you set it up, call Ride Assist at [phone]. Use this number to activate a Will Call pickup, or if your transportation is late for a scheduled pickup. Use this number, too, if you need to make a change or cancel a previously scheduled reservation.  If you need transportation services right away, call [phone]. The after-hours call center is staffed 24 hours to handle ride assistance and urgent reservation requests (including discharges) 365 days a year. Urgent trips include sick visits, hospital discharge requests and life-sustaining treatment.  Call the [facility] Line at [phone], at any time, 24 hours a day, 7 days a week. If you are in danger or need immediate medical attention call 911.  If you would like help to quit smoking, call 1-800-QUIT-NOW (912-694-8105) OR Espaol: 1-855-Djelo-Ya (8-756-433-2951) o para ms informacin haga clic aqu or Text READY to 884-166 to register via text  Jaime Flores - following are the goals we discussed in your visit today:    Goals Addressed     Timeframe:  Long-Range Goal Priority:  High Start Date: 08/23/22                            Expected End Date: ongoing                      Follow Up Date 10/08/23   - practice safe sex - schedule appointment for flu shot - schedule appointment for vaccines needed due to my age or health - schedule recommended health tests (blood work, mammogram, colonoscopy, pap test) - schedule and keep appointment for annual check-up    Why is this important?   Screening tests can find diseases early when they are easier to treat.  Your doctor or nurse will talk with you about which tests are important for you.  Getting shots for common diseases like the flu and shingles will help prevent them.  09/13/23:  Has PCP appt tomorrow.  Attending CBT.  Patient verbalizes understanding of instructions and care plan provided today and agrees to view in MyChart. Active MyChart status and patient understanding of how to access instructions and care plan via MyChart confirmed with patient.     The Managed Medicaid care management team will reach out to the patient again over the next 30 business  days.  The  Patient  has been provided with contact information for the Managed Medicaid care management team and has been advised to call with any health related questions or concerns.   [name] RN, BSN, [employer] [facility] RN Care Manager Direct Dial [phone]/NAT [phone] Website: [email or web address]  Following is a copy of your plan of care:  Care Plan : RN Care Manager Plan of Care  Updates made by [name], RN since 09/13/2023 12:00 AM     Problem: Health Promotion or Disease Self-Management (General Plan of Care)      Long-Range Goal: Chronic Disease Management   Start Date: 08/23/2022  Expected End Date: 10/09/2023  Priority: High  Note:   Current Barriers:  Knowledge Deficits related to plan of care for management of Anxiety, Depression,  allergic rhinitis, and PNES, tobacco use  Chronic Disease Management support and education needs related to Anxiety, Depression, Allergic Rhinitis, and PNES, tobacco use  09/13/23: Attending CBT.  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  Regular f/u with PCP.  Has NEURO appt in ? March.  Smokes 1/2 ppd.  Job difficulties d/t seizures  RNCM Clinical Goal(s):  Patient will verbalize understanding of plan for management of Anxiety, Depression, Allergic Rhinitis, and PNES, tobacco use  as evidenced by patient report verbalize basic understanding of  Anxiety, Depression, Allergic Rhinitis, and PNES, tobacco use  disease process and self health management plan as evidenced by patient report take all medications exactly as prescribed and will call provider for medication related questions as evidenced by patient report demonstrate understanding of rationale for each prescribed medication as evidenced by patient report attend all scheduled medical appointments as evidenced by patient report and EMR review demonstrate Ongoing adherence to prescribed treatment plan for Anxiety, Depression, Allergic Rhinitis, and PNES, tobacco use as evidenced by patient report and EMR review continue to work with RN Care Manager to address care management and care coordination needs related to  Anxiety, Depression, Allergic Rhinitis, and PNES as evidenced by adherence to CM Team Scheduled appointments through collaboration with RN Care manager, provider, and care team.   Interventions: Inter-disciplinary care team collaboration (see longitudinal plan of care) Evaluation of current treatment plan related to  self management and patient's adherence to plan as established by provider Collaborated with LCSW LCSW referral for CBT-completed 09/13/23:  patient to reach out to HR/Legal Aid regarding work questions/concerns  Smoking Cessation Interventions:  (Status:  New goal.) Long Term Goal Reviewed smoking history:  ;  currently smoking 1/2 ppd On a scale of 1-10, reports MOTIVATION to quit is 1 On a scale of 1-10, reports CONFIDENCE in quitting is 1 09/24/22:  Patient is not interested in stopping right now.  Evaluation of current treatment plan reviewed Advised patient to discuss smoking cessation options with provider Provided contact information for [place] Quit Line (1-800-QUIT-NOW) Discussed plans with patient for ongoing care management follow up and provided patient with direct contact information for care management team Assessed social determinant of health barriers     (Status:  New goal.)  Long Term Goal Evaluation of current treatment plan related to Anxiety, Depression, Allergic Rhinitis, and PNES , self-management and patient's adherence to plan as established by provider. Discussed plans with patient for ongoing care management follow up and provided patient with direct contact information for care management team Evaluation of current treatment plan related to anxiety, depression, rhinitis, PNES  and patient's adherence to plan as established by provider Reviewed medications with patient Reviewed scheduled/upcoming provider appointments  Discussed plans with patient for ongoing care management follow up and provided patient with direct contact information for care management team Assessed social determinant of health barriers  Patient Goals/Self-Care Activities: Take all medications as prescribed Attend all scheduled provider appointments Call pharmacy for medication refills 3-7  days in advance of running out of medications Perform all self care activities independently  Perform IADL's (shopping, preparing meals, housekeeping, managing finances) independently Call provider office for new concerns or questions  07/11/23:  Schedule f/u CBT appt.-completed  Follow Up Plan:  The patient has been provided with contact information for the care management team and has been advised to call with any  health related questions or concerns.  The care management team will reach out to the patient again over the next 45 business  days.

## 2023-09-15 ENCOUNTER — Encounter: Payer: Self-pay | Admitting: Family Medicine

## 2023-09-18 ENCOUNTER — Telehealth (INDEPENDENT_AMBULATORY_CARE_PROVIDER_SITE_OTHER): Payer: Medicaid Other | Admitting: Family Medicine

## 2023-09-18 ENCOUNTER — Encounter: Payer: Self-pay | Admitting: Family Medicine

## 2023-09-18 DIAGNOSIS — F419 Anxiety disorder, unspecified: Secondary | ICD-10-CM | POA: Diagnosis not present

## 2023-09-18 DIAGNOSIS — F339 Major depressive disorder, recurrent, unspecified: Secondary | ICD-10-CM | POA: Diagnosis not present

## 2023-09-18 MED ORDER — QUETIAPINE FUMARATE 25 MG PO TABS: 25.0000 mg | ORAL_TABLET | Freq: Every day | ORAL | 1 refills | Status: DC

## 2023-09-18 MED ORDER — QUETIAPINE FUMARATE ER 150 MG PO TB24: 150.0000 mg | ORAL_TABLET | Freq: Every day | ORAL | 1 refills | Status: DC

## 2023-09-18 NOTE — Addendum Note (Signed)
 Addended by: Arville Care on: 09/18/2023 04:42 PM   Modules accepted: Orders

## 2023-09-18 NOTE — Progress Notes (Signed)
 Virtual Visit via MyChart video note  I connected with Jaime Flores on 09/18/23 at 1626 by video and verified that I am speaking with the correct person using two identifiers. Jaime Flores is currently located at home and patient are currently with her during visit. The provider, Elige Radon Rocco Kerkhoff, MD is located in their office at time of visit.  Call ended at 1634  I discussed the limitations, risks, security and privacy concerns of performing an evaluation and management service by video and the availability of in person appointments. I also discussed with the patient that there may be a patient responsible charge related to this service. The patient expressed understanding and agreed to proceed.   History and Present Illness: Patient is calling in for anxiety and depression recheck.  She is not having as much anxiety.  The 150 is not helping sleep but the 150 plus 50 is too strong and she is sleepy next morning. She is taking them at 8-830 and it was not enough. She denies any suicidal ideations.  She is doing better with anxiety. She is feeling a little more depressed with being off of work.   1. Anxiety   2. Depression, recurrent [facility])     Outpatient Encounter Medications as of 09/18/2023  Medication Sig   QUEtiapine (SEROQUEL) 25 MG tablet Take 1 tablet (25 mg total) by mouth at bedtime.   cyclobenzaprine (FLEXERIL) 10 MG tablet TAKE (1) TABLET BY MOUTH (3) TIMES DAILY AS NEEDED FOR MUSCLE SPASMS.   QUEtiapine Fumarate (SEROQUEL XR) 150 MG 24 hr tablet Take 1 tablet (150 mg total) by mouth at bedtime.   sertraline (ZOLOFT) 100 MG tablet Take 1.5 tablets (150 mg total) by mouth daily.   [DISCONTINUED] QUEtiapine (SEROQUEL) 50 MG tablet Take 1 tablet (50 mg total) by mouth at bedtime.   No facility-administered encounter medications on file as of 09/18/2023.    Review of Systems  Constitutional:  Negative for chills and fever.  Eyes:  Negative for visual disturbance.   Respiratory:  Negative for chest tightness and shortness of breath.   Cardiovascular:  Negative for chest pain and leg swelling.  Musculoskeletal:  Negative for back pain and gait problem.  Skin:  Negative for rash.  Neurological:  Negative for dizziness, light-headedness and headaches.  Psychiatric/Behavioral:  Positive for dysphoric mood and sleep disturbance. Negative for agitation, behavioral problems, self-injury and suicidal ideas. The patient is nervous/anxious.   All other systems reviewed and are negative.   Observations/Objective: Patient sounds comfortable and in no acute distress  Assessment and Plan: Problem List Items Addressed This Visit       Other   Anxiety - Primary   Relevant Medications   QUEtiapine (SEROQUEL) 25 MG tablet   Depression, recurrent (HCC)   Relevant Medications   QUEtiapine (SEROQUEL) 25 MG tablet    Decrease seroquel to 25 mg for sleep and still having depression.  Follow up plan: Return if symptoms worsen or fail to improve, for 1-2 months depression.     I discussed the assessment and treatment plan with the patient. The patient was provided an opportunity to ask questions and all were answered. The patient agreed with the plan and demonstrated an understanding of the instructions.   The patient was advised to call back or seek an in-person evaluation if the symptoms worsen or if the condition fails to improve as anticipated.  The above assessment and management plan was discussed with the patient. The patient verbalized understanding  of and has agreed to the management plan. Patient is aware to call the clinic if symptoms persist or worsen. Patient is aware when to return to the clinic for a follow-up visit. Patient educated on when it is appropriate to go to the emergency department.    I provided 8 minutes of non-face-to-face time during this encounter.    Nils Pyle, MD

## 2023-09-23 ENCOUNTER — Other Ambulatory Visit (HOSPITAL_COMMUNITY): Payer: Self-pay

## 2023-09-23 ENCOUNTER — Telehealth: Payer: Self-pay

## 2023-09-23 NOTE — Telephone Encounter (Signed)
 Pharmacy Patient Advocate Encounter   Received notification from  [facility] Portal that prior authorization for QUEtiapine Fumarate 25MG  tablets is required/requested.   Insurance verification completed.   The patient is insured through [facility] .   Per test claim: PA required; PA submitted to above mentioned insurance via CoverMyMeds Key/confirmation #/EOC [facility] Status is pending

## 2023-09-23 NOTE — Telephone Encounter (Signed)
 Pharmacy Patient Advocate Encounter  Received notification from [facility] that Prior Authorization for Quetiapine 25 mg tablets has been APPROVED from 09/23/23 to 09/22/24. Unable to obtain price due to refill too soon rejection, last fill date 09/23/23 next available fill date05/10/25   PA #/Case ID/Reference #: [id number]

## 2023-09-27 ENCOUNTER — Other Ambulatory Visit: Payer: Self-pay

## 2023-09-27 DIAGNOSIS — F445 Conversion disorder with seizures or convulsions: Secondary | ICD-10-CM

## 2023-10-08 ENCOUNTER — Ambulatory Visit (HOSPITAL_COMMUNITY): Payer: Medicaid Other | Admitting: Clinical

## 2023-10-08 ENCOUNTER — Other Ambulatory Visit: Payer: Self-pay | Admitting: Obstetrics and Gynecology

## 2023-10-08 NOTE — Patient Outreach (Signed)
 Medicaid Managed Care   Nurse Care Manager Note  10/08/2023 Name:  Jaime Flores MRN:  [id number] DOB:  Mar 10, 1986  Jaime Flores is an 38 y.o. year old female who is a primary patient of [name], Elige Radon, MD.  The [facility] Managed Care Coordination team was consulted for assistance with:    Chronic healthcare management needs, tobacco use, anxiety/depression/BP, PNES  Jaime Flores was given information about Medicaid Managed Care Coordination team services today. Jaime Flores Patient agreed to services and verbal consent obtained.  Engaged with patient by telephone for follow up visit in response to provider referral for case management and/or care coordination services.   Patient is participating in a Managed Medicaid Plan:  Yes  Assessments/Interventions:  Review of past medical history, allergies, medications, health status, including review of consultants reports, laboratory and other test data, was performed as part of comprehensive evaluation and provision of chronic care management services.  SDOH (Social Drivers of Health) assessments and interventions performed: SDOH Interventions    Flowsheet Row Patient Outreach Telephone from 10/08/2023 in [place] POPULATION HEALTH DEPARTMENT Patient Outreach Telephone from 09/13/2023 in [place] POPULATION HEALTH DEPARTMENT Office Visit from 09/02/2023 in [facility] Patient Outreach Telephone from 08/13/2023 in [place] POPULATION HEALTH DEPARTMENT Patient Outreach Telephone from 07/11/2023 in [place] POPULATION HEALTH DEPARTMENT Patient Outreach Telephone from 06/13/2023 in [place] POPULATION HEALTH DEPARTMENT  SDOH Interventions        Housing Interventions -- -- -- Intervention Not Indicated -- --  Utilities Interventions Intervention Not Indicated -- -- -- Intervention Not Indicated --  Alcohol Usage Interventions Intervention Not Indicated (Score <7) -- -- -- Intervention Not  Indicated (Score <7) --  Depression Interventions/Treatment  -- -- Medication, Currently on Treatment -- -- --  Physical Activity Interventions -- -- -- Intervention Not Indicated -- --  Health Literacy Interventions -- Intervention Not Indicated -- -- -- Intervention Not Indicated     Care Plan Allergies  Allergen Reactions   Catfish [Fish Allergy] Shortness Of Breath   Doxycycline Shortness Of Breath   Wellbutrin [Bupropion] Shortness Of Breath, Nausea Only and Palpitations   Tape Swelling    Plastic tape please use paper   Medications Reviewed Today     Reviewed by [name], RN (Registered Nurse) on 10/08/23 at 1520  Med List Status: <None>   Medication Order Taking? Sig Documenting Provider Last Dose Status Informant  cyclobenzaprine (FLEXERIL) 10 MG tablet [id number] No TAKE (1) TABLET BY MOUTH (3) TIMES DAILY AS NEEDED FOR MUSCLE SPASMS. [name], Elige Radon, MD Taking Active   Multiple Vitamin (MULTIVITAMIN) tablet [id number]  Take 1 tablet by mouth daily. [provider]  Active   QUEtiapine (SEROQUEL) 25 MG tablet [id number]  Take 1 tablet (25 mg total) by mouth at bedtime. [name], Elige Radon, MD  Active   QUEtiapine Fumarate (SEROQUEL XR) 150 MG 24 hr tablet [id number]  Take 1 tablet (150 mg total) by mouth at bedtime. [name], Elige Radon, MD  Active   sertraline (ZOLOFT) 100 MG tablet [id number] No Take 1.5 tablets (150 mg total) by mouth daily. [name], Elige Radon, MD Taking Active            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   Prediabetes 02/15/2020   Anxiety 11/18/2019   Depression, recurrent (HCC) 11/18/2019   Seasonal and perennial allergic rhinitis 05/15/2019   Amenorrhea 01/27/2013  Conditions to be addressed/monitored per PCP order:  Chronic healthcare management needs, tobacco use, anxiety/depression/BP, PNES  Care Plan : RN Care Manager Plan of Care  Updates made by  [name], RN since 10/08/2023 12:00 AM     Problem: Health Promotion or Disease Self-Management (General Plan of Care)      Long-Range Goal: Chronic Disease Management   Start Date: 08/23/2022  Expected End Date: 01/08/2024  Priority: High  Note:   Current Barriers:  Knowledge Deficits related to plan of care for management of Anxiety, Depression, allergic rhinitis, and PNES, tobacco use  Chronic Disease Management support and education needs related to Anxiety, Depression, Allergic Rhinitis, and PNES, tobacco use  10/08/23: patient with seizure activity when having stress-finds CBT helpful.  To follow up on NEURO appt.  Considering Psychiatry appt.  Currently not working and having trouble sleeping.    RNCM Clinical Goal(s):  Patient will verbalize understanding of plan for management of Anxiety, Depression, Allergic Rhinitis, and PNES, tobacco use  as evidenced by patient report verbalize basic understanding of  Anxiety, Depression, Allergic Rhinitis, and PNES, tobacco use  disease process and self health management plan as evidenced by patient report take all medications exactly as prescribed and will call provider for medication related questions as evidenced by patient report demonstrate understanding of rationale for each prescribed medication as evidenced by patient report attend all scheduled medical appointments as evidenced by patient report and EMR review demonstrate Ongoing adherence to prescribed treatment plan for Anxiety, Depression, Allergic Rhinitis, and PNES, tobacco use as evidenced by patient report and EMR review continue to work with RN Care Manager to address care management and care coordination needs related to  Anxiety, Depression, Allergic Rhinitis, and PNES as evidenced by adherence to CM Team Scheduled appointments through collaboration with RN Care manager, provider, and care team.   Interventions: Inter-disciplinary care team collaboration (see longitudinal  plan of care) Evaluation of current treatment plan related to  self management and patient's adherence to plan as established by provider Collaborated with LCSW LCSW referral for CBT-completed 09/13/23:  patient to reach out to HR/Legal Aid regarding work questions/concerns  Smoking Cessation Interventions:  (Status:  New goal.) Long Term Goal Reviewed smoking history:  ; currently smoking 1/2 ppd On a scale of 1-10, reports MOTIVATION to quit is 1 On a scale of 1-10, reports CONFIDENCE in quitting is 1 09/24/22:  Patient is not interested in stopping right now.  Evaluation of current treatment plan reviewed Advised patient to discuss smoking cessation options with provider Provided contact information for [place] Quit Line (1-800-QUIT-NOW) Discussed plans with patient for ongoing care management follow up and provided patient with direct contact information for care management team Assessed social determinant of health barriers     (Status:  New goal.)  Long Term Goal Evaluation of current treatment plan related to Anxiety, Depression, Allergic Rhinitis, and PNES , self-management and patient's adherence to plan as established by provider. Discussed plans with patient for ongoing care management follow up and provided patient with direct contact information for care management team Evaluation of current treatment plan related to anxiety, depression, rhinitis, PNES  and patient's adherence to plan as established by provider Reviewed medications with patient Reviewed scheduled/upcoming provider appointments  Discussed plans with patient for ongoing care management follow up and provided patient with direct contact information for care management team Assessed social determinant of health barriers  Patient Goals/Self-Care Activities: Take all medications as prescribed Attend all scheduled provider appointments  Call pharmacy for medication refills 3-7 days in advance of running out of  medications Perform all self care activities independently  Perform IADL's (shopping, preparing meals, housekeeping, managing finances) independently Call provider office for new concerns or questions  07/11/23:  Schedule f/u CBT appt.-completed  Follow Up Plan:  The patient has been provided with contact information for the care management team and has been advised to call with any health related questions or concerns.  The care management team will reach out to the patient again over the next 45 business  days.    Long-Range Goal: Establish Plan of Care for Chronic Disease Management Needs   Priority: High  Note:   Timeframe:  Long-Range Goal Priority:  High Start Date: 08/23/22                            Expected End Date: ongoing                      Follow Up Date 11/07/23   - practice safe sex - schedule appointment for flu shot - schedule appointment for vaccines needed due to my age or health - schedule recommended health tests (blood work, mammogram, colonoscopy, pap test) - schedule and keep appointment for annual check-up    Why is this important?   Screening tests can find diseases early when they are easier to treat.  Your doctor or nurse will talk with you about which tests are important for you.  Getting shots for common diseases like the flu and shingles will help prevent them.  10/08/23: patient to see NEURO.  Continues CBT.   Follow Up:  Patient agrees to Care Plan and Follow-up.  Plan: The Managed Medicaid care management team will reach out to the patient again over the next 45 business  days. and The  Patient has been provided with contact information for the Managed Medicaid care management team and has been advised to call with any health related questions or concerns.  Date/time of next scheduled RN care management/care coordination outreach:  11/07/22 at 1

## 2023-10-08 NOTE — Patient Instructions (Signed)
 Visit Information  Ms. Jaime Flores was given information about Medicaid Managed Care team care coordination services as a part of their Healthy [facility] Medicaid benefit. [name] verbally consented to engagement with the [facility] Managed Care team.   If you are experiencing a medical emergency, please call 911 or report to your local emergency department or urgent care.   If you have a non-emergency medical problem during routine business hours, please contact your provider's office and ask to speak with a nurse.   For questions related to your Healthy [facility] health plan, please call: [phone] or visit the homepage here: [email or web address]  If you would like to schedule transportation through your Healthy [facility] plan, please call the following number at least 2 days in advance of your appointment: [phone]  For information about your ride after you set it up, call Ride Assist at [phone]. Use this number to activate a Will Call pickup, or if your transportation is late for a scheduled pickup. Use this number, too, if you need to make a change or cancel a previously scheduled reservation.  If you need transportation services right away, call [phone]. The after-hours call center is staffed 24 hours to handle ride assistance and urgent reservation requests (including discharges) 365 days a year. Urgent trips include sick visits, hospital discharge requests and life-sustaining treatment.  Call the [facility] Line at [phone], at any time, 24 hours a day, 7 days a week. If you are in danger or need immediate medical attention call 911.  If you would like help to quit smoking, call 1-800-QUIT-NOW (647-853-5388) OR Espaol: 1-855-Djelo-Ya (8-756-433-2951) o para ms informacin haga clic aqu or Text READY to 884-166 to register via text  Ms. Jaime Flores - following are the goals we discussed in your visit today:    Goals Addressed    Timeframe:  Long-Range Goal Priority:  High Start Date: 08/23/22                            Expected End Date: ongoing                      Follow Up Date 11/07/23   - practice safe sex - schedule appointment for flu shot - schedule appointment for vaccines needed due to my age or health - schedule recommended health tests (blood work, mammogram, colonoscopy, pap test) - schedule and keep appointment for annual check-up    Why is this important?   Screening tests can find diseases early when they are easier to treat.  Your doctor or nurse will talk with you about which tests are important for you.  Getting shots for common diseases like the flu and shingles will help prevent them.  10/08/23: patient to see NEURO.  Continues CBT.  Patient verbalizes understanding of instructions and care plan provided today and agrees to view in MyChart. Active MyChart status and patient understanding of how to access instructions and care plan via MyChart confirmed with patient.     The Managed Medicaid care management team will reach out to the patient again over the next 45 business  days.  The  Patient   has been provided with contact information for the Managed Medicaid care management team and has been advised to call with any health related questions or concerns.   [name] RNC, BSN, [employer] [facility] Health RN Care Manager Direct Dial [phone] Website: [email or web address]  Following is a copy of your plan of care:  Care Plan : RN Care Manager Plan of Care  Updates made by [name], RN since 10/08/2023 12:00 AM     Problem: Health Promotion or Disease Self-Management (General Plan of Care)      Long-Range Goal: Chronic Disease Management   Start Date: 08/23/2022  Expected End Date: 01/08/2024  Priority: High  Note:   Current Barriers:  Knowledge Deficits related to plan of care for management of Anxiety, Depression,  allergic rhinitis, and PNES, tobacco use  Chronic Disease Management support and education needs related to Anxiety, Depression, Allergic Rhinitis, and PNES, tobacco use  10/08/23: patient with seizure activity when having stress-finds CBT helpful.  To follow up on NEURO appt.  Considering Psychiatry appt.  Currently not working and having trouble sleeping.    RNCM Clinical Goal(s):  Patient will verbalize understanding of plan for management of Anxiety, Depression, Allergic Rhinitis, and PNES, tobacco use  as evidenced by patient report verbalize basic understanding of  Anxiety, Depression, Allergic Rhinitis, and PNES, tobacco use  disease process and self health management plan as evidenced by patient report take all medications exactly as prescribed and will call provider for medication related questions as evidenced by patient report demonstrate understanding of rationale for each prescribed medication as evidenced by patient report attend all scheduled medical appointments as evidenced by patient report and EMR review demonstrate Ongoing adherence to prescribed treatment plan for Anxiety, Depression, Allergic Rhinitis, and PNES, tobacco use as evidenced by patient report and EMR review continue to work with RN Care Manager to address care management and care coordination needs related to  Anxiety, Depression, Allergic Rhinitis, and PNES as evidenced by adherence to CM Team Scheduled appointments through collaboration with RN Care manager, provider, and care team.   Interventions: Inter-disciplinary care team collaboration (see longitudinal plan of care) Evaluation of current treatment plan related to  self management and patient's adherence to plan as established by provider Collaborated with LCSW LCSW referral for CBT-completed 09/13/23:  patient to reach out to HR/Legal Aid regarding work questions/concerns  Smoking Cessation Interventions:  (Status:  New goal.) Long Term Goal Reviewed  smoking history:  ; currently smoking 1/2 ppd On a scale of 1-10, reports MOTIVATION to quit is 1 On a scale of 1-10, reports CONFIDENCE in quitting is 1 09/24/22:  Patient is not interested in stopping right now.  Evaluation of current treatment plan reviewed Advised patient to discuss smoking cessation options with provider Provided contact information for [place] Quit Line (1-800-QUIT-NOW) Discussed plans with patient for ongoing care management follow up and provided patient with direct contact information for care management team Assessed social determinant of health barriers     (Status:  New goal.)  Long Term Goal Evaluation of current treatment plan related to Anxiety, Depression, Allergic Rhinitis, and PNES , self-management and patient's adherence to plan as established by provider. Discussed plans with patient for ongoing care management follow up and provided patient with direct contact information for care management team Evaluation of current treatment plan related to anxiety, depression, rhinitis, PNES  and patient's adherence to plan as established by provider Reviewed medications with patient Reviewed scheduled/upcoming provider appointments  Discussed plans with patient for ongoing care management follow up and provided patient with direct contact information for care management team Assessed social determinant of health barriers  Patient Goals/Self-Care Activities: Take all medications as prescribed Attend all scheduled provider appointments Call pharmacy for medication refills 3-7 days  in advance of running out of medications Perform all self care activities independently  Perform IADL's (shopping, preparing meals, housekeeping, managing finances) independently Call provider office for new concerns or questions  07/11/23:  Schedule f/u CBT appt.-completed  Follow Up Plan:  The patient has been provided with contact information for the care management team and has been  advised to call with any health related questions or concerns.  The care management team will reach out to the patient again over the next 45 business  days.

## 2023-10-09 ENCOUNTER — Ambulatory Visit (INDEPENDENT_AMBULATORY_CARE_PROVIDER_SITE_OTHER): Admitting: Clinical

## 2023-10-09 DIAGNOSIS — F331 Major depressive disorder, recurrent, moderate: Secondary | ICD-10-CM

## 2023-10-09 DIAGNOSIS — F419 Anxiety disorder, unspecified: Secondary | ICD-10-CM | POA: Diagnosis not present

## 2023-10-09 NOTE — Progress Notes (Signed)
 Virtual Visit via Video Note   I connected with Jaime Flores. Tissue on 10/09/23 at  10:00 AM EST by a video enabled telemedicine application and verified that I am speaking with the correct person using two identifiers.   Location: Patient: Home Provider: Office   I discussed the limitations of evaluation and management by telemedicine and the availability of in person appointments. The patient expressed understanding and agreed to proceed.     THERAPIST PROGRESS NOTE   Session Time: 10:00 AM- 10:45 AM   Participation Level: Active   Behavioral Response: CasualAlertDepressed   Type of Therapy: Individual Therapy   Treatment Goals addressed: Coping for Anxiety and mood management   Interventions: CBT, Motivational Interviewing, Solution Focused and Strength-based   Summary: Jaime Flores is a 38 y.o. female who presents with Depression and Anxiety . The OPT therapist worked with the patient for her OPT treatment. The OPT therapist utilized Motivational Interviewing to assist in creating therapeutic repore. The patient in the session was engaged and work in collaboration giving feedback about her past few weeks. The patient noted she is currently battling a head cold she attributes to recent high and low changes with the temperature correlating with seasonal change. The patient spoke about worry and concern around her son and his sleep cycle being off contributing to the patients sleep cycle being off.. The patient is working with her PCP on finding sleep aid that will help her regulate her sleep cycle. The patient spoke about with warmer weather.The OPT therapist utilized Cognitive Behavioral Therapy through cognitive restructuring as well as worked with the patient on coping strategies to assist in management of mood. The OPT therapist worked with the patient on self check ins and utilizing her support network. The OPT therapist competed a mood scale evaluation.The patient spoke about her  interaction and communication with her family members. The patient spoke about her plans to be active in exercising more through walking with her spouse on a daily basis..The patient noted currently she is taking things on a day to day basis and currently focusing on task management and regulating sleep cycle. The patient continued to speak about her seizure activity she connects with stressors. The patient overviewed her upcoming health appointments as listed in patients MyChart.   Suicidal/Homicidal: Nowithout intent/plan   Therapist Response: The OPT therapist worked with the patient for the patients scheduled session. The patient was engaged in her session and gave feedback in relation to triggers, symptoms, and behavior responses over the past few weeks. The patient spoke about ongoing adjustment to recent time change and trying to adjust getting her son asleep by letting him stay up until its dark which is later now with time change. The patient identifies she has more difficulty not with falling asleep , but staying asleep. The OPT therapist worked with the patient utilizing an in session Cognitive Behavioral Therapy exercise. The patient was responsive in the session and verbalized, "I have been doing better and less stressed overall , however, my sleep cycle is still not great and that plays with my energy". The OPT therapist worked with the patient on herself fact checking and not allowing others to negatively impact her functioning.  The patient spoke about ongoing work to get her husband approval for disability.The OPT gave support and encouragement with the patient who will work on using coping strategies with consistency and being aware of her baseline and working on ongoing consistency in meeting her treatment goals with consistency. The  patient spoke about incorporating a positive mindset  and utilizing the in my control vs out of my control exercise in her everyday life to help manage stressors  the OPT therapist overviewed non-medication pre-sleep things to help her with regulating sleep cycle including warm shower/bath before bed, room temperature, noise, and bedding/comfort pillows. The OPT therapist will continue treatment work with the patient in her next scheduled session.   Plan: Return again in 2/3 weeks.   Diagnosis:      Axis I:  Recurrent moderate MDD with Anxiety                         Axis II: No diagnosis   Collaboration of Care: No additional collaboration for this session.   Patient/Guardian was advised Release of Information must be obtained prior to any record release in order to collaborate their care with an outside provider. Patient/Guardian was advised if they have not already done so to contact the registration department to sign all necessary forms in order for [place] to release information regarding their care.    Consent: Patient/Guardian gives verbal consent for treatment and assignment of benefits for services provided during this visit. Patient/Guardian expressed understanding and agreed to proceed.        I discussed the assessment and treatment plan with the patient. The patient was provided an opportunity to ask questions and all were answered. The patient agreed with the plan and demonstrated an understanding of the instructions.   The patient was advised to call back or seek an in-person evaluation if the symptoms worsen or if the condition fails to improve as anticipated.   I provided 45 minutes of non-face-to-face time during this encounter.   Suzan Garibaldi, LCSW   10/09/2023

## 2023-11-07 ENCOUNTER — Ambulatory Visit: Admitting: *Deleted

## 2023-11-12 ENCOUNTER — Ambulatory Visit (INDEPENDENT_AMBULATORY_CARE_PROVIDER_SITE_OTHER): Admitting: Clinical

## 2023-11-12 DIAGNOSIS — F331 Major depressive disorder, recurrent, moderate: Secondary | ICD-10-CM | POA: Diagnosis not present

## 2023-11-12 DIAGNOSIS — F419 Anxiety disorder, unspecified: Secondary | ICD-10-CM | POA: Diagnosis not present

## 2023-11-12 NOTE — Progress Notes (Signed)
 Virtual Visit via Video Note   I connected with Jaime Flores on 11/12/23 at  1:00 PM EST by a video enabled telemedicine application and verified that I am speaking with the correct person using two identifiers.   Location: Patient: Home Provider: Office   I discussed the limitations of evaluation and management by telemedicine and the availability of in person appointments. The patient expressed understanding and agreed to proceed.     THERAPIST PROGRESS NOTE   Session Time: 1:00 PM- 1:30 PM   Participation Level: Active   Behavioral Response: CasualAlertDepressed   Type of Therapy: Individual Therapy   Treatment Goals addressed: Coping for Anxiety and mood management   Interventions: CBT, Motivational Interviewing, Solution Focused and Strength-based   Summary: Jaime Flores is a 38 y.o. female who presents with Depression and Anxiety . The OPT therapist worked with the patient for her OPT treatment. The OPT therapist utilized Motivational Interviewing to assist in creating therapeutic repore. The patient in the session was engaged and work in collaboration giving feedback about her past few weeks. The patient noted she feels 25mg  of her sleep aid seems to be working currently to help her regulate her sleep cycle. The patient spoke about with warmer weather being more active.The OPT therapist utilized Cognitive Behavioral Therapy through cognitive restructuring as well as worked with the patient on coping strategies to assist in management of mood. The OPT therapist worked with the patient on self check ins and utilizing her support network. The OPT therapist competed a mood scale evaluation.The patient spoke about her interaction and communication with her family members. The patient spoke about her plans to be active in exercising more through walking with her spouse on a daily basis..The patient noted having less stressors over the past few weeks and feeling better getting her  sleep cycle to a place where its more consistent and not excessively sleeping. The patient spoke about the correlation over the course of the past few weeks with having fewer seizure events. The patient spoke about utilizing her alarm to take her medication and this has helped her to stay consistent with taking her medication.The patient overviewed her upcoming health appointments as listed in patients MyChart including upcoming Neurologic appointment with [facility] [place].   Suicidal/Homicidal: Nowithout intent/plan   Therapist Response: The OPT therapist worked with the patient for the patients scheduled session. The patient was engaged in her session and gave feedback in relation to triggers, symptoms, and behavior responses over the past few weeks. The patient spoke about ongoing adjustment  in getting her asleep cycle regulated. The patient is currently seeking a new job after being let go from [employer]. The patient spoke about her interaction with family over the course of the past Easter holiday weekend. OPT therapist worked with the patient utilizing an in session Cognitive Behavioral Therapy exercise. The patient was responsive in the session and verbalized, "I have been doing good and I want to say I have not actually had like a full on seizure since I spoke with you last". The OPT therapist worked with the patient on herself fact checking and not allowing others to negatively impact her functioning.  The patient spoke about ongoing work to get her husband approval for disability.The OPT gave support and encouragement with the patient who will work on using coping strategies with consistency and being aware of her baseline and working on ongoing consistency in meeting her treatment goals with consistency. The patient spoke about  incorporating a positive mindset  and utilizing the in my control vs out of my control exercise in her everyday life to help manage stressors the OPT  therapist overviewed non-medication pre-sleep things to help her with regulating sleep cycle including warm shower/bath before bed, room temperature, noise, and bedding/comfort pillows and the patient gave feedback about these steps helping her along with her med management to regulate her sleep cycle.  Additionally the patient identified interacting with her puppy as a stress relief.The OPT therapist will continue treatment work with the patient in her next scheduled session.   Plan: Return again in 2/3 weeks.   Diagnosis:      Axis I:  Recurrent moderate MDD with Anxiety                         Axis II: No diagnosis   Collaboration of Care: No additional collaboration for this session.   Patient/Guardian was advised Release of Information must be obtained prior to any record release in order to collaborate their care with an outside provider. Patient/Guardian was advised if they have not already done so to contact the registration department to sign all necessary forms in order for us  to release information regarding their care.    Consent: Patient/Guardian gives verbal consent for treatment and assignment of benefits for services provided during this visit. Patient/Guardian expressed understanding and agreed to proceed.        I discussed the assessment and treatment plan with the patient. The patient was provided an opportunity to ask questions and all were answered. The patient agreed with the plan and demonstrated an understanding of the instructions.   The patient was advised to call back or seek an in-person evaluation if the symptoms worsen or if the condition fails to improve as anticipated.   I provided 30 minutes of non-face-to-face time during this encounter.   Secundino Dach, LCSW   11/12/2023

## 2023-11-20 ENCOUNTER — Encounter: Payer: Self-pay | Admitting: Family Medicine

## 2023-11-20 ENCOUNTER — Telehealth: Admitting: Family Medicine

## 2023-11-20 DIAGNOSIS — J3489 Other specified disorders of nose and nasal sinuses: Secondary | ICD-10-CM | POA: Diagnosis not present

## 2023-11-20 DIAGNOSIS — J0110 Acute frontal sinusitis, unspecified: Secondary | ICD-10-CM | POA: Diagnosis not present

## 2023-11-20 MED ORDER — FLUTICASONE PROPIONATE 50 MCG/ACT NA SUSP: 2.0000 | Freq: Every day | NASAL | 6 refills | Status: AC

## 2023-11-20 MED ORDER — AMOXICILLIN-POT CLAVULANATE 875-125 MG PO TABS
1.0000 | ORAL_TABLET | Freq: Two times a day (BID) | ORAL | 0 refills | Status: AC
Start: 2023-11-20 — End: 2023-11-27

## 2023-11-20 NOTE — Progress Notes (Signed)
 Virtual Visit via Video   I connected with patient on 11/20/23 at 1310 by a video enabled telemedicine application and verified that I am speaking with the correct person using two identifiers.  Location patient: Home Location provider: [facility] Persons participating in the virtual visit: Patient and Provider  I discussed the limitations of evaluation and management by telemedicine and the availability of in person appointments. The patient expressed understanding and agreed to proceed.  Subjective:   HPI:  Pt presents today for  Chief Complaint  Patient presents with   Sinus Problem    History of Present Illness   Jaime Flores is a 38 year old female who presents with symptoms of a sinus infection.  She has been experiencing a severe headache, rated as an eight out of ten, primarily located on one side of her head but starting to affect the other side as well. The headache is persistent and is described as being located 'right there within my eyebrow'.  She also experiences occasional nasal drainage throughout the day, but no constant rhinorrhea or cough. At night, she sometimes feels pressure under her eyeballs and in her cheeks.  Her symptoms began on Saturday, four days prior to the visit. She has attempted to manage her symptoms with over-the-counter medications. She took a nasal decongestant, specifically a sinus twelve-hour medication comparable to Sudafed, but it exacerbated her symptoms. She also took ibuprofen  at a different time to address the headache, but did not report significant relief. She has not used Flonase .       Review of Systems  Constitutional:  Positive for malaise/fatigue. Negative for chills, diaphoresis, fever and weight loss.  HENT:  Positive for congestion, ear pain and sinus pain. Negative for ear discharge, hearing loss, nosebleeds, sore throat and tinnitus.   Respiratory:  Negative for stridor.    Neurological:  Positive for headaches. Negative for dizziness, tingling, tremors, sensory change, speech change, focal weakness, seizures, loss of consciousness and weakness.  All other systems reviewed and are negative.    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   Prediabetes 02/15/2020   Anxiety 11/18/2019   Depression, recurrent (HCC) 11/18/2019   Seasonal and perennial allergic rhinitis 05/15/2019   Amenorrhea 01/27/2013    Social History   Tobacco Use   Smoking status: Every Day    Current packs/day: 0.50    Types: Cigarettes   Smokeless tobacco: Never  Substance Use Topics   Alcohol use: No    Current Outpatient Medications:    amoxicillin -clavulanate (AUGMENTIN ) 875-125 MG tablet, Take 1 tablet by mouth 2 (two) times daily for 7 days., Disp: 14 tablet, Rfl: 0   fluticasone  (FLONASE ) 50 MCG/ACT nasal spray, Place 2 sprays into both nostrils daily., Disp: 16 g, Rfl: 6   cyclobenzaprine  (FLEXERIL ) 10 MG tablet, TAKE (1) TABLET BY MOUTH (3) TIMES DAILY AS NEEDED FOR MUSCLE SPASMS., Disp: 30 tablet, Rfl: 1   Multiple Vitamin (MULTIVITAMIN) tablet, Take 1 tablet by mouth daily., Disp: , Rfl:    QUEtiapine  (SEROQUEL ) 25 MG tablet, Take 1 tablet (25 mg total) by mouth at bedtime., Disp: 90 tablet, Rfl: 1   QUEtiapine  Fumarate (SEROQUEL  XR) 150 MG 24 hr tablet, Take 1 tablet (150 mg total) by mouth at bedtime., Disp: 90 tablet, Rfl: 1   sertraline  (ZOLOFT ) 100 MG tablet, Take 1.5 tablets (150 mg total) by mouth daily., Disp: 45 tablet, Rfl: 2  Allergies  Allergen Reactions   Catfish [Fish Allergy ] Shortness Of Breath   Doxycycline Shortness Of Breath   Wellbutrin  [Bupropion ] Shortness Of Breath, Nausea Only and Palpitations   Tape Swelling    Plastic tape please use paper    Objective:   There were no vitals taken for this visit.  Patient is well-developed, well-nourished in no acute distress.  Resting  comfortably at home.  Head is normocephalic, atraumatic.  No labored breathing.  Speech is clear and coherent with logical content.  Patient is alert and oriented at baseline.    Assessment and Plan:   Tyson was seen today for sinus problem.  Diagnoses and all orders for this visit:  Acute non-recurrent frontal sinusitis -     amoxicillin -clavulanate (AUGMENTIN ) 875-125 MG tablet; Take 1 tablet by mouth 2 (two) times daily for 7 days. -     fluticasone  (FLONASE ) 50 MCG/ACT nasal spray; Place 2 sprays into both nostrils daily.  Frontal sinus pain -     amoxicillin -clavulanate (AUGMENTIN ) 875-125 MG tablet; Take 1 tablet by mouth 2 (two) times daily for 7 days. -     fluticasone  (FLONASE ) 50 MCG/ACT nasal spray; Place 2 sprays into both nostrils daily.  Posterior rhinorrhea -     amoxicillin -clavulanate (AUGMENTIN ) 875-125 MG tablet; Take 1 tablet by mouth 2 (two) times daily for 7 days. -     fluticasone  (FLONASE ) 50 MCG/ACT nasal spray; Place 2 sprays into both nostrils daily.     Acute sinusitis Acute sinusitis with severe unilateral headache rated at 8/10, now affecting both sides. Symptoms include rhinorrhea, significant headache, and facial pain under the eyes and in the cheeks, present since Saturday. Sudafed was ineffective and exacerbated symptoms. No cough present. - Prescribe Flonase  for daily use over the next two weeks to alleviate sinus congestion and pain. - Instruct on proper Flonase  administration: bend chin to chest, point nozzle towards nostril, and squirt to avoid throat drainage. - Prescribe Augmentin  if symptoms do not significantly improve after a couple of days of Flonase  use. - Advise to report any new symptoms or changes.        Return if symptoms worsen or fail to improve.  Kattie Parrot, FNP-C [facility] [address] [place], [place] [address] [phone]  11/20/2023  Time spent with the patient: 12 minutes, of  which >50% was spent in obtaining information about symptoms, reviewing previous labs, evaluations, and treatments, counseling about condition (please see the discussed topics above), and developing a plan to further investigate it; had a number of questions which I addressed.

## 2023-11-21 ENCOUNTER — Other Ambulatory Visit: Payer: Self-pay | Admitting: *Deleted

## 2023-11-21 ENCOUNTER — Other Ambulatory Visit: Payer: Self-pay

## 2023-11-21 NOTE — Patient Instructions (Signed)
 Visit Information  Ms. Mcnair was given information about Medicaid Managed Care team care coordination services as a part of their Healthy [facility] Medicaid benefit. Lavender G Liaw verbally consented to engagement with the [facility] Managed Care team.   If you are experiencing a medical emergency, please call 911 or report to your local emergency department or urgent care.   If you have a non-emergency medical problem during routine business hours, please contact your provider's office and ask to speak with a nurse.   For questions related to your Healthy [facility] health plan, please call: [phone] or visit the homepage here: [email or web address]  If you would like to schedule transportation through your Healthy [facility] plan, please call the following number at least 2 days in advance of your appointment: [phone]  For information about your ride after you set it up, call Ride Assist at [phone]. Use this number to activate a Will Call pickup, or if your transportation is late for a scheduled pickup. Use this number, too, if you need to make a change or cancel a previously scheduled reservation.  If you need transportation services right away, call [phone]. The after-hours call center is staffed 24 hours to handle ride assistance and urgent reservation requests (including discharges) 365 days a year. Urgent trips include sick visits, hospital discharge requests and life-sustaining treatment.  Call the [facility] Line at [phone], at any time, 24 hours a day, 7 days a week. If you are in danger or need immediate medical attention call 911.  If you would like help to quit smoking, call 1-800-QUIT-NOW (239 720 3073) OR Espaol: 1-855-Djelo-Ya (1-884-166-0630) o para ms informacin haga clic aqu or Text READY to 160-109 to register via text  Ms. Gunnett - following are the goals we discussed in your visit today:    Goals Addressed   None       Patient verbalizes understanding of instructions and care plan provided today and agrees to view in MyChart. Active MyChart status and patient understanding of how to access instructions and care plan via MyChart confirmed with patient.     The  Patient                                              has been provided with contact information for the Managed Medicaid care management team and has been advised to call with any health related questions or concerns.  No further follow up required: Closing From:  Complex Care Management Patient has met all care management goals. Care Management case will be closed. Patient has been provided contact information should new needs arise.   Grandville Lax, BSN RN [facility], [facility] RN Care Manager Direct Dial: [phone]  Fax: [phone]   Following is a copy of your plan of care:  There are no care plans that you recently modified to display for this patient.

## 2023-11-21 NOTE — Patient Outreach (Signed)
 Complex Care Management   Visit Note  11/21/2023  Name:  Jaime Flores MRN: [id number] DOB: [birth year]  Situation: Referral received for Complex Care Management related to  Depression  I obtained verbal consent from Patient.  Visit completed with patient  on the phone  Background:   Past Medical History:  Diagnosis Date   Amenorrhea 01/27/2013   Angio-edema    Breast pain 03/10/2015   Encounter for menstrual regulation 07/05/2015   History of pseudoseizure    Irregular intermenstrual bleeding 07/28/2015   Irregular periods 07/05/2015   Screening for STD (sexually transmitted disease) 08/07/2013   Seizures (HCC)    Urticaria    UTI (urinary tract infection)    Vaginal discharge 06/10/2013    Assessment: Patient Reported Symptoms:  Cognitive Cognitive Status: Alert and oriented to person, place, and time, Insightful and able to interpret abstract concepts, Normal speech and language skills Cognitive/Intellectual Conditions Management [RPT]: Not Assessed   Health Maintenance Behaviors: Annual physical exam, Sleep adequate Healing Pattern: Average Health Facilitated by: Rest, Stress management  Neurological Neurological Review of Symptoms: No symptoms reported Neurological Self-Management Outcome: 4 (good)  HEENT HEENT Symptoms Reported: No symptoms reported HEENT Self-Management Outcome: 4 (good)    Cardiovascular Cardiovascular Symptoms Reported: No symptoms reported Does patient have uncontrolled Hypertension?: No Cardiovascular Management Strategies: Routine screening Cardiovascular Self-Management Outcome: 4 (good)  Respiratory Respiratory Symptoms Reported: No symptoms reported Respiratory Conditions: Seasonal allergies Respiratory Self-Management Outcome: 4 (good)  Endocrine Patient reports the following symptoms related to hypoglycemia or hyperglycemia : No symptoms reported Is patient diabetic?: No Endocrine Self-Management Outcome: 4 (good)  Gastrointestinal  Gastrointestinal Symptoms Reported: Diarrhea Gastrointestinal Self-Management Outcome: 4 (good) Nutrition Risk Screen (CP): No indicators present  Genitourinary Genitourinary Symptoms Reported: No symptoms reported Genitourinary Self-Management Outcome: 4 (good)  Integumentary Integumentary Symptoms Reported: No symptoms reported Skin Self-Management Outcome: 4 (good)  Musculoskeletal Musculoskelatal Symptoms Reviewed: No symptoms reported Musculoskeletal Self-Management Outcome: 4 (good) Falls in the past year?: Yes Number of falls in past year: 1 or less Was there an injury with Fall?: No Fall Risk Category Calculator: 1 Patient Fall Risk Level: Low Fall Risk Patient at Risk for Falls Due to: Other (Comment) (Seizure) Fall risk Follow up: Falls evaluation completed, Education provided, Falls prevention discussed  Psychosocial Psychosocial Symptoms Reported: No symptoms reported Behavioral Management Strategies: Adequate rest Behavioral Health Self-Management Outcome: 4 (good) Major Change/Loss/Stressor/Fears (CP): Medical condition, family Techniques to [place] with Loss/Stress/Change: Counseling, Diversional activities, Support group, Medication Quality of Family Relationships: helpful, involved, supportive Do you feel physically threatened by others?: No      11/21/2023    2:21 PM  Depression screen PHQ 2/9  Decreased Interest 1  Down, Depressed, Hopeless 0  PHQ - 2 Score 1    There were no vitals filed for this visit.  Medications Reviewed Today     Reviewed by Remona Carmel, RN (Registered Nurse) on 11/21/23 at 1359  Med List Status: <None>   Medication Order Taking? Sig Documenting Provider Last Dose Status Informant  amoxicillin -clavulanate (AUGMENTIN ) 875-125 MG tablet [id number] No Take 1 tablet by mouth 2 (two) times daily for 7 days.  Patient not taking: Reported on 11/21/2023   Galvin Jules, FNP Not Taking Active   cyclobenzaprine  (FLEXERIL ) 10 MG tablet  [id number] Yes TAKE (1) TABLET BY MOUTH (3) TIMES DAILY AS NEEDED FOR MUSCLE SPASMS. [name], Lucio Sabin, MD Taking Active   fluticasone  (FLONASE ) 50 MCG/ACT nasal spray [id number] Yes Place 2 sprays into both nostrils  daily. Galvin Jules, FNP Taking Active   Multiple Vitamin (MULTIVITAMIN) tablet [id number] No Take 1 tablet by mouth daily. [provider] Unknown Active   QUEtiapine  (SEROQUEL ) 25 MG tablet [id number] Yes Take 1 tablet (25 mg total) by mouth at bedtime. [name], Lucio Sabin, MD Taking Active   QUEtiapine  Fumarate (SEROQUEL  XR) 150 MG 24 hr tablet [id number] No Take 1 tablet (150 mg total) by mouth at bedtime.  Patient not taking: Reported on 11/21/2023   [name], Lucio Sabin, MD Not Taking Consider Medication Status and Discontinue   sertraline  (ZOLOFT ) 100 MG tablet [id number] Yes Take 1.5 tablets (150 mg total) by mouth daily. [name], Lucio Sabin, MD Taking Active             Recommendation:   PCP Follow-up  Follow Up Plan:   Closing From:  Complex Care Management Patient has met all care management goals. Care Management case will be closed. Patient has been provided contact information should new needs arise.   Grandville Lax, BSN RN [facility], [facility] RN Care Manager Direct Dial: [phone]  Fax: [phone]

## 2023-12-17 ENCOUNTER — Ambulatory Visit (HOSPITAL_COMMUNITY): Admitting: Clinical

## 2024-01-01 ENCOUNTER — Telehealth: Payer: Self-pay | Admitting: Neurology

## 2024-01-01 NOTE — Telephone Encounter (Signed)
 Request to r/s appointment

## 2024-01-02 ENCOUNTER — Ambulatory Visit: Admitting: Neurology

## 2024-04-03 ENCOUNTER — Other Ambulatory Visit: Payer: Self-pay | Admitting: Family Medicine

## 2024-04-03 DIAGNOSIS — F419 Anxiety disorder, unspecified: Secondary | ICD-10-CM

## 2024-04-03 DIAGNOSIS — F339 Major depressive disorder, recurrent, unspecified: Secondary | ICD-10-CM

## 2024-04-06 ENCOUNTER — Ambulatory Visit: Admitting: Neurology

## 2024-04-19 ENCOUNTER — Other Ambulatory Visit: Payer: Self-pay | Admitting: Family Medicine

## 2024-04-19 DIAGNOSIS — F419 Anxiety disorder, unspecified: Secondary | ICD-10-CM

## 2024-04-19 DIAGNOSIS — F339 Major depressive disorder, recurrent, unspecified: Secondary | ICD-10-CM

## 2024-05-10 ENCOUNTER — Ambulatory Visit
Admission: EM | Admit: 2024-05-10 | Discharge: 2024-05-10 | Disposition: A | Discharge status: Home / Self Care | Attending: Family Medicine | Admitting: Family Medicine

## 2024-05-10 DIAGNOSIS — J069 Acute upper respiratory infection, unspecified: Secondary | ICD-10-CM | POA: Diagnosis not present

## 2024-05-10 LAB — POC COVID19/FLU A&B COMBO
Covid Antigen, POC: NEGATIVE
Influenza A Antigen, POC: NEGATIVE
Influenza B Antigen, POC: NEGATIVE

## 2024-05-10 MED ORDER — PROMETHAZINE-DM 6.25-15 MG/5ML PO SYRP: 5.0000 mL | ORAL_SOLUTION | Freq: Four times a day (QID) | ORAL | 0 refills | Status: DC | PRN

## 2024-05-10 MED ORDER — AZELASTINE HCL 0.1 % NA SOLN
1.0000 | Freq: Two times a day (BID) | NASAL | 0 refills | Status: DC
Start: 2024-05-10 — End: 2024-05-20

## 2024-05-10 NOTE — ED Provider Notes (Signed)
 [facility]    CSN: [id number] Arrival date & time: 05/10/24  1359      History   Chief Complaint No chief complaint on file.   HPI Jaime Flores is a 38 y.o. female.   Patient presenting today with 3-day history of headache, sore throat, congestion, chills, sweats.  Denies fever, chest pain, shortness of breath, abdominal pain, vomiting, diarrhea.  So far not trying anything over-the-counter for symptoms.    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 Authorizing Provider  azelastine (ASTELIN) 0.1 % nasal spray Place 1 spray into both nostrils 2 (two) times daily. Use in each nostril as directed 05/10/24  Yes Stuart Vernell Norris, PA-C  promethazine -dextromethorphan (PROMETHAZINE -DM) 6.25-15 MG/5ML syrup Take 5 mLs by mouth 4 (four) times daily as needed. 05/10/24  Yes Stuart Vernell Norris, PA-C  cyclobenzaprine  (FLEXERIL )  10 MG tablet TAKE (1) TABLET BY MOUTH (3) TIMES DAILY AS NEEDED FOR MUSCLE SPASMS. 06/26/23   [name], Fonda LABOR, MD  fluticasone  (FLONASE ) 50 MCG/ACT nasal spray Place 2 sprays into both nostrils daily. 11/20/23   Severa Rock HERO, FNP  Multiple Vitamin (MULTIVITAMIN) tablet Take 1 tablet by mouth daily.    [provider]  QUEtiapine  (SEROQUEL ) 25 MG tablet Take 1 tablet (25 mg total) by mouth at bedtime. 04/03/24   [name], Fonda LABOR, MD  QUEtiapine  Fumarate (SEROQUEL  XR) 150 MG 24 hr tablet Take 1 tablet (150 mg total) by mouth at bedtime. Patient not taking: Reported on 11/21/2023 09/18/23   [name], Fonda LABOR, MD  sertraline  (ZOLOFT ) 100 MG tablet Take 1.5 tablets (150 mg total) by mouth daily. **NEEDS TO BE SEEN BEFORE NEXT REFILL** 04/20/24   [name], Fonda LABOR, MD    Family History Family History  Problem Relation Age of Onset   Autism Son    Diabetes Maternal Grandmother    Stroke Maternal Grandmother    Heart failure Maternal Grandmother    Stroke Maternal Grandfather    Stroke Father    Seizures Father    Allergies Father        penicillin  Heart attack Father    Other Mother        herpes   Hyperlipidemia Mother    Heart failure Paternal Grandfather    Diabetes Paternal Grandmother    Hypertension Paternal Grandmother    Autism Brother    Other Paternal Uncle        heart exploded   Cancer Maternal Aunt    Diabetes Maternal Aunt    Hypertension Maternal Aunt    Immunodeficiency Neg Hx    Urticaria Neg Hx    Eczema Neg Hx    Atopy Neg Hx    Asthma Neg Hx    Angioedema Neg Hx    Allergic rhinitis Neg Hx     Social History Social History   Tobacco Use   Smoking status: Every Day    Current packs/day: 0.50    Types: Cigarettes   Smokeless tobacco: Never  Vaping Use   Vaping status: Never Used  Substance Use Topics   Alcohol use: No   Drug use: No     Allergies   Catfish [fish allergy ], Doxycycline, Wellbutrin  [bupropion ], and Tape   Review  of Systems Review of Systems Per HPI  Physical Exam Triage Vital Signs ED Triage Vitals  Encounter Vitals Group     BP 05/10/24 1407 113/72     Girls Systolic BP Percentile --      Girls Diastolic BP Percentile --      Boys Systolic BP Percentile --      Boys Diastolic BP Percentile --      Pulse Rate 05/10/24 1407 84     Resp 05/10/24 1407 20     Temp 05/10/24 1407 98.4 F (36.9 C)     Temp Source 05/10/24 1407 Oral     SpO2 05/10/24 1407 96 %     Weight --      Height --      Head Circumference --      Peak Flow --      Pain Score 05/10/24 1410 8     Pain Loc --      Pain Education --      Exclude from Growth Chart --    No data found.  Updated Vital Signs BP 113/72 (BP Location: Right Arm)   Pulse 84   Temp 98.4 F (36.9 C) (Oral)   Resp 20   SpO2 96%   Visual Acuity Right Eye Distance:   Left Eye Distance:   Bilateral Distance:    Right Eye Near:   Left Eye Near:    Bilateral Near:     Physical Exam Vitals and nursing note reviewed.  Constitutional:      Appearance: Normal appearance.  HENT:     Head: Atraumatic.     Right Ear: Tympanic membrane and external ear normal.     Left Ear: Tympanic membrane and external ear normal.     Nose: Rhinorrhea present.     Mouth/Throat:     Mouth: Mucous membranes are moist.     Pharynx: Posterior oropharyngeal erythema present.  Eyes:     Extraocular Movements: Extraocular movements intact.     Conjunctiva/sclera: Conjunctivae normal.  Cardiovascular:     Rate and Rhythm: Normal rate and regular rhythm.     Heart sounds: Normal heart sounds.  Pulmonary:     Effort: Pulmonary effort is normal.     Breath sounds: Normal breath sounds. No wheezing.  Musculoskeletal:        General: Normal range of  motion.     Cervical back: Normal range of motion and neck supple.  Skin:    General: Skin is warm and dry.  Neurological:     Mental Status: She is alert and oriented to person, place, and time.  Psychiatric:         Mood and Affect: Mood normal.        Thought Content: Thought content normal.      UC Treatments / Results  Labs (all labs ordered are listed, but only abnormal results are displayed) Labs Reviewed  POC COVID19/FLU A&B COMBO    EKG   Radiology No results found.  Procedures Procedures (including critical care time)  Medications Ordered in UC Medications - No data to display  Initial Impression / Assessment and Plan / UC Course  I have reviewed the triage vital signs and the nursing notes.  Pertinent labs & imaging results that were available during my care of the patient were reviewed by me and considered in my medical decision making (see chart for details).     Vitals and exam reassuring today, suspect viral respiratory infection.  Rapid flu and COVID-negative.  Treat with Phenergan  DM, Astelin, supportive over-the-counter medications and home care.  Return for worsening symptoms.  Final Clinical Impressions(s) / UC Diagnoses   Final diagnoses:  Viral URI with cough     Discharge Instructions      In addition to the prescribed medications, you may use DayQuil, NyQuil, saline sinus rinses, humidifiers    ED Prescriptions     Medication Sig Dispense Auth. Provider   promethazine -dextromethorphan (PROMETHAZINE -DM) 6.25-15 MG/5ML syrup Take 5 mLs by mouth 4 (four) times daily as needed. 100 mL Stuart Vernell Norris, PA-C   azelastine (ASTELIN) 0.1 % nasal spray Place 1 spray into both nostrils 2 (two) times daily. Use in each nostril as directed 30 mL Stuart Vernell Norris, PA-C      PDMP not reviewed this encounter.   Stuart Vernell Norris, PA-C 05/10/24 1451

## 2024-05-10 NOTE — ED Triage Notes (Signed)
 Pt reports headache sore throat phlegm in the back of the throat, chills and sweats x 3 days

## 2024-05-10 NOTE — Discharge Instructions (Addendum)
 In addition to the prescribed medications, you may use DayQuil, NyQuil, saline sinus rinses, humidifiers

## 2024-05-17 ENCOUNTER — Emergency Department (HOSPITAL_COMMUNITY)

## 2024-05-17 ENCOUNTER — Emergency Department (HOSPITAL_COMMUNITY)
Admission: EM | Admit: 2024-05-17 | Discharge: 2024-05-17 | Disposition: A | Discharge status: Home / Self Care | Attending: Emergency Medicine | Admitting: Emergency Medicine

## 2024-05-17 ENCOUNTER — Other Ambulatory Visit: Payer: Self-pay

## 2024-05-17 ENCOUNTER — Encounter (HOSPITAL_COMMUNITY): Payer: Self-pay

## 2024-05-17 DIAGNOSIS — J9811 Atelectasis: Secondary | ICD-10-CM | POA: Diagnosis not present

## 2024-05-17 DIAGNOSIS — R051 Acute cough: Secondary | ICD-10-CM

## 2024-05-17 DIAGNOSIS — R059 Cough, unspecified: Secondary | ICD-10-CM | POA: Diagnosis not present

## 2024-05-17 MED ORDER — BENZONATATE 100 MG PO CAPS
100.0000 mg | ORAL_CAPSULE | Freq: Once | ORAL | Status: AC
  Administered 2024-05-17: 100 mg via ORAL
  Filled 2024-05-17: qty 1

## 2024-05-17 MED ORDER — NAPROXEN 250 MG PO TABS
500.0000 mg | ORAL_TABLET | Freq: Once | ORAL | Status: AC
  Administered 2024-05-17: 500 mg via ORAL
  Filled 2024-05-17: qty 2

## 2024-05-17 MED ORDER — BENZONATATE 100 MG PO CAPS: 100.0000 mg | ORAL_CAPSULE | Freq: Three times a day (TID) | ORAL | 0 refills | Status: DC

## 2024-05-17 NOTE — Discharge Instructions (Signed)
 You were evaluated in the emergency room today for continued cough and congestion.  Your symptoms are likely still related to a virus.  I am prescribing Tessalon  Perles for cough which seemed to help your symptoms in the ER, you can also take over-the-counter ibuprofen  for discomfort.  Come back to the ER if you have new or worsening symptoms, otherwise follow-up closely with your primary care doctor at your appointment this week.

## 2024-05-17 NOTE — ED Provider Notes (Signed)
 Jaime Flores Provider Note   CSN: [id number] Arrival date & time: 05/17/24  1424     Patient presents with: URI and Fatigue   Jaime Flores is a 38 y.o. female.  She reports history of seizures.  Presents to the ER today for evaluation of cough x 1 week she was seen at urgent care a week ago and diagnosed with viral upper respiratory infection.  She reports chills but no sputum production, no fever, she does report sore throat and occasional loose stools.  No nausea or vomiting.  No shortness of breath, she states when she coughs hard she has sharp pain in her midsternal area, no chest pain otherwise.  She denies sick contacts.  She has been some mild nasal congestion, she had been taking nasal spray prescribed urgent care but stopped because of causing her nose to be sore.  She states she was prescribed cough syrup as well.  She states this has been helping minimally.    URI      Prior to Admission medications   Medication Sig Start Date End Date Taking? Authorizing Provider  azelastine (ASTELIN) 0.1 % nasal spray Place 1 spray into both nostrils 2 (two) times daily. Use in each nostril as directed 05/10/24   Stuart Vernell Norris, PA-C  cyclobenzaprine  (FLEXERIL ) 10 MG tablet TAKE (1) TABLET BY MOUTH (3) TIMES DAILY AS NEEDED FOR MUSCLE SPASMS. 06/26/23   [name], Fonda LABOR, MD  fluticasone  (FLONASE ) 50 MCG/ACT nasal spray Place 2 sprays into both nostrils daily. 11/20/23   Severa Rock HERO, FNP  Multiple Vitamin (MULTIVITAMIN) tablet Take 1 tablet by mouth daily.    [provider]  promethazine -dextromethorphan (PROMETHAZINE -DM) 6.25-15 MG/5ML syrup Take 5 mLs by mouth 4 (four) times daily as needed. 05/10/24   Stuart Vernell Norris, PA-C  QUEtiapine  (SEROQUEL ) 25 MG tablet Take 1 tablet (25 mg total) by mouth at bedtime. 04/03/24   [name], Fonda LABOR, MD  QUEtiapine  Fumarate (SEROQUEL  XR) 150 MG 24 hr tablet Take 1 tablet (150  mg total) by mouth at bedtime. Patient not taking: Reported on 11/21/2023 09/18/23   [name], Fonda LABOR, MD  sertraline  (ZOLOFT ) 100 MG tablet Take 1.5 tablets (150 mg total) by mouth daily. **NEEDS TO BE SEEN BEFORE NEXT REFILL** 04/20/24   [name], Fonda LABOR, MD    Allergies: [place]inton gums allergy ], Doxycycline, Wellbutrin  [bupropion ], and Tape    Review of Systems  Updated Vital Signs BP (!) 119/58 (BP Location: Right Arm)   Pulse 64   Temp 98.3 F (36.8 C) (Oral)   Resp 18   Ht 5' 2 (1.575 m)   Wt 75 kg   LMP 05/16/2024 (Exact Date)   SpO2 99%   BMI 30.25 kg/m   Physical Exam Vitals and nursing note reviewed.  Constitutional:      General: She is not in acute distress.    Appearance: She is well-developed.  HENT:     Head: Normocephalic and atraumatic.     Right Ear: Tympanic membrane normal.     Left Ear: Tympanic membrane normal.     Nose:     Right Turbinates: Not enlarged or swollen.     Left Turbinates: Not enlarged or swollen.     Right Sinus: No maxillary sinus tenderness or frontal sinus tenderness.     Left Sinus: No maxillary sinus tenderness or frontal sinus tenderness.     Mouth/Throat:     Mouth: Mucous membranes are dry.  Pharynx: Oropharynx is clear. No oropharyngeal exudate.  Eyes:     Extraocular Movements: Extraocular movements intact.     Conjunctiva/sclera: Conjunctivae normal.     Pupils: Pupils are equal, round, and reactive to light.  Cardiovascular:     Rate and Rhythm: Normal rate and regular rhythm.     Heart sounds: No murmur heard. Pulmonary:     Effort: Pulmonary effort is normal. No respiratory distress.     Breath sounds: Normal breath sounds.  Abdominal:     Palpations: Abdomen is soft.     Tenderness: There is no abdominal tenderness.  Musculoskeletal:        General: No swelling.     Cervical back: Neck supple.  Skin:    General: Skin is warm and dry.     Capillary Refill: Capillary refill takes less than 2 seconds.   Neurological:     General: No focal deficit present.     Mental Status: She is alert and oriented to person, place, and time.  Psychiatric:        Mood and Affect: Mood normal.     (all labs ordered are listed, but only abnormal results are displayed) Labs Reviewed - No data to display  EKG: None  Radiology: No results found.   Procedures   Medications Ordered in the ED  benzonatate  (TESSALON ) capsule 100 mg (has no administration in time range)  naproxen  (NAPROSYN ) tablet 500 mg (has no administration in time range)                                    Medical Decision Making This patient presents to the ED for concern of cough and fatigue, this involves an extensive number of treatment options, and is a complaint that carries with it a high risk of complications and morbidity.  The differential diagnosis includes bronchitis, pneumonia, COPD, sinusitis, other   Problem List / ED Course / Critical interventions / Medication management  Cough-patient having cough for about a week, seen in urgent care and diagnosed with viral URI at this having cough.  She had improvement of symptoms here with Tessalon  Perles, she had a pain in her chest with cough only which is reproducible on palpitation.  Likely musculoskeletal pain from coughing.  Chest x-ray was normal, she did not have sinus pressure congestion or sputum production to suggest bacterial illness.  Will continue to treat symptomatically, advised on follow-up and return precautions.  She has a PCP appointment coming up in 3 days. Patient already had negative respiratory panel at urgent care she reports. I have reviewed the patients home medicines and have made adjustments as needed     Amount and/or Complexity of Data Reviewed Radiology: ordered.  Risk Prescription drug management.        Final diagnoses:  None    ED Discharge Orders     None          Jaime Flores 05/17/24 2311    Melvenia Motto, MD 05/18/24 1511

## 2024-05-17 NOTE — ED Triage Notes (Addendum)
 Pt from home, Seen recently at [facility] and told she had a URI, pt states that over the past few days she been having sternal pain desrcibed as sharp. Endorses night sweats, chills, non productive cough, also having diarrhe and a sore throat. Pt AAOx4, ambulatory

## 2024-05-20 ENCOUNTER — Ambulatory Visit: Admitting: Family Medicine

## 2024-05-20 ENCOUNTER — Telehealth: Admitting: Family Medicine

## 2024-05-20 ENCOUNTER — Encounter: Payer: Self-pay | Admitting: Family Medicine

## 2024-05-20 DIAGNOSIS — J069 Acute upper respiratory infection, unspecified: Secondary | ICD-10-CM

## 2024-05-20 MED ORDER — DIPHENHYDRAMINE HCL 25 MG PO TABS
25.0000 mg | ORAL_TABLET | Freq: Four times a day (QID) | ORAL | 1 refills | Status: AC | PRN
Start: 2024-05-20 — End: ?

## 2024-05-20 MED ORDER — SALINE SPRAY 0.65 % NA SOLN: 1.0000 | NASAL | 1 refills | Status: AC | PRN

## 2024-05-20 MED ORDER — BENZONATATE 100 MG PO CAPS: 100.0000 mg | ORAL_CAPSULE | Freq: Three times a day (TID) | ORAL | 1 refills | Status: DC

## 2024-05-20 NOTE — Progress Notes (Signed)
 Virtual Visit via MyChart video note  I connected with Jaime Flores on 05/20/24 at 1118 by video and verified that I am speaking with the correct person using two identifiers. Jaime Flores is currently located at home and patient and husband are currently with her during visit. The provider, Fonda LABOR Selden Noteboom, MD is located in their office at time of visit.  Call ended at 1130  I discussed the limitations, risks, security and privacy concerns of performing an evaluation and management service by video and the availability of in person appointments. I also discussed with the patient that there may be a patient responsible charge related to this service. The patient expressed understanding and agreed to proceed.   History and Present Illness:  Discussed the use of AI scribe software for clinical note transcription with the patient, who gave verbal consent to proceed.  History of Present Illness   Jaime Flores DOBRATZ is a 38 year old female who presents with a persistent dry cough and partial lung collapse. She is accompanied by her husband.  Cough and upper respiratory symptoms - Persistent dry cough for nearly two weeks - Cough is nonproductive unless forced, producing a small amount of clear, saliva-like fluid - Cough exacerbates throat soreness - Hoarseness of voice, which had previously resolved but has recurred - Postnasal drainage with sensation of mucus stuck in the throat, not relieved by nose blowing - Night sweats present - Muscle pain in the ribs from coughing, not painful unless actively coughing - No exposure to sick contacts - Negative COVID-19 and influenza tests at urgent care  Pulmonary imaging and findings - Recent chest X-ray revealed partial lung collapse and atelectasis  Medication use and response - Tessalon  Perles prescribed and effective in managing cough symptoms - Mucinex  and DayQuil previously used without relief and caused worsening of symptoms;  discontinued use of over-the-counter medications - Nasal spray prescribed by urgent care discontinued after three days due to nasal rawness and discomfort - No recent use of Flonase  - Avoiding caffeine - Has not yet tried saltwater gargles as suggested by her husband         Outpatient Encounter Medications as of 05/20/2024  Medication Sig   diphenhydrAMINE  (BENADRYL  ALLERGY ) 25 MG tablet Take 1 tablet (25 mg total) by mouth every 6 (six) hours as needed for allergies.   sodium chloride  (OCEAN) 0.65 % SOLN nasal spray Place 1 spray into both nostrils as needed for congestion.   benzonatate  (TESSALON ) 100 MG capsule Take 1 capsule (100 mg total) by mouth every 8 (eight) hours.   cyclobenzaprine  (FLEXERIL ) 10 MG tablet TAKE (1) TABLET BY MOUTH (3) TIMES DAILY AS NEEDED FOR MUSCLE SPASMS.   fluticasone  (FLONASE ) 50 MCG/ACT nasal spray Place 2 sprays into both nostrils daily.   Multiple Vitamin (MULTIVITAMIN) tablet Take 1 tablet by mouth daily.   promethazine -dextromethorphan (PROMETHAZINE -DM) 6.25-15 MG/5ML syrup Take 5 mLs by mouth 4 (four) times daily as needed.   QUEtiapine  (SEROQUEL ) 25 MG tablet Take 1 tablet (25 mg total) by mouth at bedtime.   QUEtiapine  Fumarate (SEROQUEL  XR) 150 MG 24 hr tablet Take 1 tablet (150 mg total) by mouth at bedtime. (Patient not taking: Reported on 11/21/2023)   sertraline  (ZOLOFT ) 100 MG tablet Take 1.5 tablets (150 mg total) by mouth daily. **NEEDS TO BE SEEN BEFORE NEXT REFILL**   [DISCONTINUED] azelastine (ASTELIN) 0.1 % nasal spray Place 1 spray into both nostrils 2 (two) times daily. Use in each nostril as directed   [  DISCONTINUED] benzonatate  (TESSALON ) 100 MG capsule Take 1 capsule (100 mg total) by mouth every 8 (eight) hours.   No facility-administered encounter medications on file as of 05/20/2024.    Review of Systems  Constitutional:  Negative for chills and fever.  HENT:  Positive for congestion, postnasal drip, sinus pressure, sinus pain,  sneezing and sore throat. Negative for ear discharge and ear pain.   Eyes:  Negative for visual disturbance.  Respiratory:  Positive for cough. Negative for chest tightness and shortness of breath.   Cardiovascular:  Negative for chest pain and leg swelling.  Genitourinary:  Negative for difficulty urinating and dysuria.  Musculoskeletal:  Negative for back pain and gait problem.  Skin:  Negative for rash.  Neurological:  Negative for light-headedness and headaches.  Psychiatric/Behavioral:  Negative for agitation and behavioral problems.   All other systems reviewed and are negative.   Observations/Objective: Patient is comfortable and in no acute distress  Assessment and Plan: Problem List Items Addressed This Visit   None Visit Diagnoses       URI with cough and congestion    -  Primary   Relevant Medications   benzonatate  (TESSALON ) 100 MG capsule   sodium chloride  (OCEAN) 0.65 % SOLN nasal spray   diphenhydrAMINE  (BENADRYL  ALLERGY ) 25 MG tablet          Acute upper respiratory infection Symptoms consistent with acute upper respiratory infection. Negative COVID-19 and influenza tests. Cough and postnasal drip likely causing throat irritation and hoarseness. - Prescribed nasal saline spray 4-6 times daily. - Refilled Tessalon  Perles for cough. - Prescribed Benadryl  25 mg in the evening. - Advised saltwater gargles twice daily. - Recommended Peroxyl mouthwash once daily. - Suggested ibuprofen  for rib muscle soreness as needed.         Follow up plan: Return if symptoms worsen or fail to improve.     I discussed the assessment and treatment plan with the patient. The patient was provided an opportunity to ask questions and all were answered. The patient agreed with the plan and demonstrated an understanding of the instructions.   The patient was advised to call back or seek an in-person evaluation if the symptoms worsen or if the condition fails to improve as  anticipated.  The above assessment and management plan was discussed with the patient. The patient verbalized understanding of and has agreed to the management plan. Patient is aware to call the clinic if symptoms persist or worsen. Patient is aware when to return to the clinic for a follow-up visit. Patient educated on when it is appropriate to go to the emergency department.    I provided 12 minutes of non-face-to-face time during this encounter.    Fonda DELENA Levins, MD

## 2024-05-27 ENCOUNTER — Telehealth: Payer: Self-pay

## 2024-05-27 NOTE — Telephone Encounter (Signed)
 Pt has been messaged to see if she received the form via email that has Dr. Williemae signature on it.

## 2024-05-27 NOTE — Telephone Encounter (Signed)
 Copied from CRM [id number]. Topic: General - Other >> May 27, 2024  4:10 PM Yolanda T wrote: Reason for CRM: Rosina Pinal from patients [facility] called to verify if provider really completed the Reasonable Accommodation Verification form. They recvd the letter but need the form completed as well. Please f/u with Rosina or Diane at [phone].

## 2024-05-28 ENCOUNTER — Telehealth: Payer: Self-pay

## 2024-05-28 NOTE — Telephone Encounter (Signed)
 The letter that was created and the form that has been completed are conflicting per pts landlord. The letter states that the pt feels she needs an emotional support dog and the form is checked that the pt does not have a disability and seems that the provider does not feel pt really needs the dog.  Per landlord anxiety is considered a disability. That needs to be corrected on the form and the letter needs to state that the provider feels that the pt would benefit from the support animal.  Will have Dr. Maryanne review in office tomorrow and follow up with pts landlord.

## 2024-05-29 NOTE — Telephone Encounter (Signed)
 Ok I guess a medical disability is different than what they consider a disability. A medical disability means when someone can not work a normal job, hence the way the form was filled out.  We can fill out again. Fonda Levins, MD [facility] 05/29/2024, 7:50 AM

## 2024-05-29 NOTE — Telephone Encounter (Signed)
 Left detailed message making patient aware that PCP will fill it out again and send back. Will put the form in a sleeve and give to PCP/Nurse.

## 2024-06-06 ENCOUNTER — Other Ambulatory Visit: Payer: Self-pay | Admitting: *Deleted

## 2024-06-06 DIAGNOSIS — F339 Major depressive disorder, recurrent, unspecified: Secondary | ICD-10-CM

## 2024-06-06 DIAGNOSIS — F419 Anxiety disorder, unspecified: Secondary | ICD-10-CM

## 2024-06-08 MED ORDER — SERTRALINE HCL 100 MG PO TABS: 150.0000 mg | ORAL_TABLET | Freq: Every day | ORAL | 0 refills | Status: DC

## 2024-06-08 NOTE — Telephone Encounter (Signed)
 [name] pt NTBS 30-d given 04/20/24

## 2024-06-08 NOTE — Addendum Note (Signed)
 Addended by: Joretta Eads D on: 06/08/2024 01:15 PM   Modules accepted: Orders

## 2024-06-08 NOTE — Telephone Encounter (Signed)
 Appt made 11-21 with [name].

## 2024-06-12 ENCOUNTER — Encounter: Payer: Self-pay | Admitting: Family Medicine

## 2024-06-12 ENCOUNTER — Ambulatory Visit: Admitting: Family Medicine

## 2024-06-12 VITALS — BP 124/75 | HR 69 | Temp 99.3°F | Ht 62.0 in | Wt 163.6 lb

## 2024-06-12 DIAGNOSIS — Z13220 Encounter for screening for lipoid disorders: Secondary | ICD-10-CM

## 2024-06-12 DIAGNOSIS — R0683 Snoring: Secondary | ICD-10-CM | POA: Diagnosis not present

## 2024-06-12 DIAGNOSIS — F339 Major depressive disorder, recurrent, unspecified: Secondary | ICD-10-CM

## 2024-06-12 DIAGNOSIS — R7303 Prediabetes: Secondary | ICD-10-CM | POA: Diagnosis not present

## 2024-06-12 DIAGNOSIS — F419 Anxiety disorder, unspecified: Secondary | ICD-10-CM | POA: Diagnosis not present

## 2024-06-12 DIAGNOSIS — R400 Somnolence: Secondary | ICD-10-CM

## 2024-06-12 LAB — BAYER DCA HB A1C WAIVED: HB A1C (BAYER DCA - WAIVED): 5.7 % — ABNORMAL HIGH (ref 4.8–5.6)

## 2024-06-12 MED ORDER — QUETIAPINE FUMARATE 25 MG PO TABS: 25.0000 mg | ORAL_TABLET | Freq: Every day | ORAL | 3 refills | Status: AC

## 2024-06-12 MED ORDER — SERTRALINE HCL 100 MG PO TABS: 150.0000 mg | ORAL_TABLET | Freq: Every day | ORAL | 3 refills | Status: AC

## 2024-06-12 NOTE — Progress Notes (Signed)
 BP 124/75   Pulse 69   Temp 99.3 F (37.4 C)   Ht 5' 2 (1.575 m)   Wt 163 lb 9.6 oz (74.2 kg)   LMP 05/16/2024 (Exact Date)   SpO2 97%   BMI 29.92 kg/m    Subjective:   Patient ID: Jaime Flores, female    DOB: 16-Jul-1986, 38 y.o.   MRN: [id number]  HPI: Jaime Flores is a 38 y.o. female presenting on 06/12/2024 for Medication Refill   Discussed the use of AI scribe software for clinical note transcription with the patient, who gave verbal consent to proceed.  History of Present Illness   Jaime Flores is a 38 year old female who presents with fatigue and concerns about sleep apnea. She is accompanied by Velinda, her partner.  Daytime fatigue and sleep disturbance - Significant fatigue despite sleeping well at night - Requires daytime naps, especially after morning activities - Typical sleep schedule is 11 PM to 3 AM - Persistent tiredness during the day  Suspected sleep apnea - Loud snoring at night - Heart rate drops below 50 as recorded by Fitbit - No prior sleep apnea testing - Interest in evaluation for sleep apnea  Nocturnal hyperthermia - Excessive heat sensation at night - Feels hot even when room temperature is set to 31F - Comfortable outside in 39F weather without a coat  Peripheral neuropathy symptoms - Occasional numbness in feet - Numbness attributed to positional changes - Symptoms resolve with position change  Seizure history and occupational impact - History of seizures - Currently unemployed and seeking work - Difficulty obtaining employment due to documented seizure history - Inquiry regarding eligibility for disability benefits due to seizures and possible bipolar disorder  Psychiatric history and medication use - History of anxiety - Managed with Zoloft  and Seroquel , both taken nightly between 9 and 9:30 PM at a dose of 25 mg each - Possible bipolar disorder discussed but not officially diagnosed          Relevant past  medical, surgical, family and social history reviewed and updated as indicated. Interim medical history since our last visit reviewed. Allergies and medications reviewed and updated.  Review of Systems  Constitutional:  Negative for chills and fever.  Eyes:  Negative for visual disturbance.  Respiratory:  Negative for chest tightness and shortness of breath.   Cardiovascular:  Negative for chest pain and leg swelling.  Genitourinary:  Negative for difficulty urinating and dysuria.  Musculoskeletal:  Negative for back pain and gait problem.  Skin:  Negative for rash.  Neurological:  Negative for dizziness, light-headedness and headaches.  Psychiatric/Behavioral:  Negative for agitation and behavioral problems.   All other systems reviewed and are negative.   Per HPI unless specifically indicated above   Allergies as of 06/12/2024       Reactions   Catfish [fish Allergy ] Shortness Of Breath   Doxycycline Shortness Of Breath   Wellbutrin  [bupropion ] Shortness Of Breath, Nausea Only, Palpitations   Tape Swelling   Plastic tape please use paper        Medication List        Accurate as of June 12, 2024  3:34 PM. If you have any questions, ask your nurse or doctor.          STOP taking these medications    benzonatate  100 MG capsule Commonly known as: TESSALON  Stopped by: Fonda LABOR Juliano Mceachin   promethazine -dextromethorphan 6.25-15 MG/5ML syrup Commonly known as: PROMETHAZINE -DM Stopped by: Fonda  A Ketrina Boateng       TAKE these medications    cyclobenzaprine  10 MG tablet Commonly known as: FLEXERIL  TAKE (1) TABLET BY MOUTH (3) TIMES DAILY AS NEEDED FOR MUSCLE SPASMS.   diphenhydrAMINE  25 MG tablet Commonly known as: Benadryl  Allergy  Take 1 tablet (25 mg total) by mouth every 6 (six) hours as needed for allergies.   fluticasone  50 MCG/ACT nasal spray Commonly known as: FLONASE  Place 2 sprays into both nostrils daily.   multivitamin tablet Take 1 tablet by  mouth daily.   QUEtiapine  25 MG tablet Commonly known as: SEROQUEL  Take 1 tablet (25 mg total) by mouth at bedtime. What changed: Another medication with the same name was removed. Continue taking this medication, and follow the directions you see here. Changed by: Fonda LABOR Signe Tackitt   sertraline  100 MG tablet Commonly known as: ZOLOFT  Take 1.5 tablets (150 mg total) by mouth daily.   sodium chloride  0.65 % Soln nasal spray Commonly known as: OCEAN Place 1 spray into both nostrils as needed for congestion.         Objective:   BP 124/75   Pulse 69   Temp 99.3 F (37.4 C)   Ht 5' 2 (1.575 m)   Wt 163 lb 9.6 oz (74.2 kg)   LMP 05/16/2024 (Exact Date)   SpO2 97%   BMI 29.92 kg/m   Wt Readings from Last 3 Encounters:  06/12/24 163 lb 9.6 oz (74.2 kg)  05/17/24 165 lb 6.4 oz (75 kg)  09/02/23 161 lb (73 kg)    Physical Exam Vitals and nursing note reviewed.  Constitutional:      General: She is not in acute distress.    Appearance: She is well-developed. She is not diaphoretic.  Eyes:     Conjunctiva/sclera: Conjunctivae normal.  Cardiovascular:     Rate and Rhythm: Normal rate and regular rhythm.     Heart sounds: Normal heart sounds. No murmur heard. Pulmonary:     Effort: Pulmonary effort is normal. No respiratory distress.     Breath sounds: Normal breath sounds. No wheezing.  Musculoskeletal:        General: No swelling.  Skin:    General: Skin is warm and dry.     Findings: No rash.  Neurological:     Mental Status: She is alert and oriented to person, place, and time.     Coordination: Coordination normal.  Psychiatric:        Behavior: Behavior normal.    Physical Exam   NECK: Thyroid  without masses. CHEST: Lungs clear to auscultation. CARDIOVASCULAR: Regular heart sounds, no murmurs. EXTREMITIES: No leg swelling, good pulses.         Assessment & Plan:   Problem List Items Addressed This Visit       Other   Anxiety   Relevant  Medications   QUEtiapine  (SEROQUEL ) 25 MG tablet   sertraline  (ZOLOFT ) 100 MG tablet   Other Relevant Orders   Bayer DCA Hb A1c Waived   CBC With Diff/Platelet   CMP14+EGFR   Lipid panel   TSH   Depression, recurrent - Primary   Relevant Medications   QUEtiapine  (SEROQUEL ) 25 MG tablet   sertraline  (ZOLOFT ) 100 MG tablet   Other Relevant Orders   Bayer DCA Hb A1c Waived   CBC With Diff/Platelet   CMP14+EGFR   Lipid panel   TSH   Prediabetes   Relevant Orders   Bayer DCA Hb A1c Waived   CBC With Diff/Platelet  CMP14+EGFR   Lipid panel   TSH   Other Visit Diagnoses       Lipid screening       Relevant Orders   Lipid panel     Daytime somnolence       Relevant Orders   Ambulatory referral to Sleep Studies     Snoring       Relevant Orders   Ambulatory referral to Sleep Studies           Anxiety and Depression Well-controlled with Zoloft  and Seroquel . Reports fatigue despite adequate sleep, possibly related to thyroid  function or sleep apnea. - Continue Zoloft  and Seroquel . - Ordered blood work to check thyroid  function.  Suspected Sleep Apnea Symptoms suggest sleep apnea. Prefers home sleep study for comfort and convenience. - Referred to sleep specialist for home sleep study.  Prediabetes No specific management discussed this encounter. - Ordered blood work to check blood sugar levels.  General Health Maintenance Discussed smoking cessation due to COPD and emphysema. - Encouraged smoking cessation.          Follow up plan: Return in about 3 months (around 09/12/2024), or if symptoms worsen or fail to improve, for Physical exam and recheck anxiety.  Counseling provided for all of the vaccine components Orders Placed This Encounter  Procedures   Bayer DCA Hb A1c Waived   CBC With Diff/Platelet   CMP14+EGFR   Lipid panel   TSH   Ambulatory referral to Sleep Studies    Fonda Levins, MD [facility] 06/12/2024, 3:34  PM

## 2024-06-13 LAB — CBC WITH DIFF/PLATELET
Basophils Absolute: 0.1 x10E3/uL (ref 0.0–0.2)
Basos: 1 %
EOS (ABSOLUTE): 0.2 x10E3/uL (ref 0.0–0.4)
Eos: 2 %
Hematocrit: 37.8 % (ref 34.0–46.6)
Hemoglobin: 11.9 g/dL (ref 11.1–15.9)
Immature Grans (Abs): 0 x10E3/uL (ref 0.0–0.1)
Immature Granulocytes: 0 %
Lymphocytes Absolute: 2.7 x10E3/uL (ref 0.7–3.1)
Lymphs: 30 %
MCH: 28.5 pg (ref 26.6–33.0)
MCHC: 31.5 g/dL (ref 31.5–35.7)
MCV: 91 fL (ref 79–97)
Monocytes Absolute: 0.3 x10E3/uL (ref 0.1–0.9)
Monocytes: 4 %
Neutrophils Absolute: 5.6 x10E3/uL (ref 1.4–7.0)
Neutrophils: 63 %
Platelets: 309 x10E3/uL (ref 150–450)
RBC: 4.17 x10E6/uL (ref 3.77–5.28)
RDW: 14.4 % (ref 11.7–15.4)
WBC: 8.8 x10E3/uL (ref 3.4–10.8)

## 2024-06-13 LAB — LIPID PANEL
Chol/HDL Ratio: 7.3 ratio — ABNORMAL HIGH (ref 0.0–4.4)
Cholesterol, Total: 225 mg/dL — ABNORMAL HIGH (ref 100–199)
HDL: 31 mg/dL — ABNORMAL LOW (ref >39)
LDL Chol Calc (NIH): 148 mg/dL — ABNORMAL HIGH (ref 0–99)
Triglycerides: 251 mg/dL — ABNORMAL HIGH (ref 0–149)
VLDL Cholesterol Cal: 46 mg/dL — ABNORMAL HIGH (ref 5–40)

## 2024-06-13 LAB — CMP14+EGFR
ALT: 20 IU/L (ref 0–32)
AST: 21 IU/L (ref 0–40)
Albumin: 4.4 g/dL (ref 3.9–4.9)
Alkaline Phosphatase: 98 IU/L (ref 41–116)
BUN/Creatinine Ratio: 9 (ref 9–23)
BUN: 7 mg/dL (ref 6–20)
Bilirubin Total: <0.2 mg/dL (ref 0.0–1.2)
CO2: 28 mmol/L (ref 20–29)
Calcium: 9.7 mg/dL (ref 8.7–10.2)
Chloride: 102 mmol/L (ref 96–106)
Creatinine, Ser: 0.8 mg/dL (ref 0.57–1.00)
Globulin, Total: 2.1 g/dL (ref 1.5–4.5)
Glucose: 81 mg/dL (ref 70–99)
Potassium: 3.8 mmol/L (ref 3.5–5.2)
Sodium: 142 mmol/L (ref 134–144)
Total Protein: 6.5 g/dL (ref 6.0–8.5)
eGFR: 98 mL/min/1.73 (ref >59)

## 2024-06-13 LAB — TSH: TSH: 3.81 u[IU]/mL (ref 0.450–4.500)

## 2024-06-17 ENCOUNTER — Ambulatory Visit: Payer: Self-pay | Admitting: Family Medicine

## 2024-06-26 ENCOUNTER — Encounter: Payer: Self-pay | Admitting: Adult Health

## 2024-06-26 ENCOUNTER — Ambulatory Visit: Admitting: Adult Health

## 2024-06-26 ENCOUNTER — Other Ambulatory Visit (HOSPITAL_COMMUNITY)
Admission: RE | Admit: 2024-06-26 | Discharge: 2024-06-26 | Disposition: A | Source: Ambulatory Visit | Attending: Adult Health | Admitting: Adult Health

## 2024-06-26 VITALS — BP 108/67 | HR 77 | Ht 62.5 in | Wt 162.0 lb

## 2024-06-26 DIAGNOSIS — R5383 Other fatigue: Secondary | ICD-10-CM | POA: Diagnosis not present

## 2024-06-26 DIAGNOSIS — Z3202 Encounter for pregnancy test, result negative: Secondary | ICD-10-CM | POA: Diagnosis not present

## 2024-06-26 DIAGNOSIS — Z8742 Personal history of other diseases of the female genital tract: Secondary | ICD-10-CM | POA: Insufficient documentation

## 2024-06-26 DIAGNOSIS — Z124 Encounter for screening for malignant neoplasm of cervix: Secondary | ICD-10-CM | POA: Insufficient documentation

## 2024-06-26 DIAGNOSIS — N926 Irregular menstruation, unspecified: Secondary | ICD-10-CM | POA: Diagnosis not present

## 2024-06-26 LAB — POCT URINE PREGNANCY: Preg Test, Ur: NEGATIVE

## 2024-06-26 NOTE — Progress Notes (Signed)
  Subjective:     Patient ID: Jaime Flores, female   DOB: Apr 22, 1986, 38 y.o.   MRN: [id number]  HPI Yessica is a 38 year old white female,married, G2P1102 in complaining of irregular periods, has had pink spotting at times, feels tired, sleeps a lot, but had normal thyroid  and CBC done lately. Has heartburn and milk helps, has breast tenderness at times and last time had sex had some discomfort.  Denies any hot flashes. And she needs pap.  Last pap was ASCUS +HPV 12/04/22.  PCP is Dr [name]   Review of Systems +irregular periods, has had pink spotting at times,  feels tired, sleeps a lot, but had normal thyroid  and CBC done lately at PCP +heartburn and milk helps,  + breast tenderness at times  +last time had sex had some discomfort.  Denies any hot flashes.   Reviewed past medical,surgical, social and family history. Reviewed medications and allergies.  Objective:   Physical Exam BP 108/67 (BP Location: Left Arm, Patient Position: Sitting, Cuff Size: Normal)   Pulse 77   Ht 5' 2.5 (1.588 m)   Wt 162 lb (73.5 kg)   LMP 05/16/2024 (Exact Date)   BMI 29.16 kg/m  UPT is negative    Skin warm and dry.Pelvic: external genitalia is normal in appearance no lesions, vagina: pink,urethra has no lesions or masses noted, cervix:smooth and bulbous, pap with GC/CHL and HR HPV genotyping performed, cervix bleed with EC brush, uterus: normal size, shape and contour, non tender, no masses felt, adnexa: no masses or tenderness noted. Bladder is non tender and no masses felt.  Fall risk is low  Upstream - 06/26/24 0934       Pregnancy Intention Screening   Does the patient want to become pregnant in the next year? No    Does the patient's partner want to become pregnant in the next year? No    Would the patient like to discuss contraceptive options today? No      Contraception Wrap Up   Current Method Female Sterilization    End Method Female Sterilization    Contraception Counseling  Provided No         Examination chaperoned by Clarita Salt LPN  Assessment:     1. Routine Papanicolaou smear Pap sent  - Cytology - PAP( [facility])  2. History of abnormal cervical Pap smear Pap sent   3. Irregular periods (Primary) Periods not regular, may spot pinkish brown at times UPT is negative Review handout in perimenopause  - POCT urine pregnancy Keep period log  4. Tired Has normal CBC and thyroid  in November at PCP   She is on meds for anxiety and depression  Plan:     Follow up in 3 months for ROS

## 2024-07-02 ENCOUNTER — Ambulatory Visit: Payer: Self-pay | Admitting: Adult Health

## 2024-07-02 LAB — CYTOLOGY - PAP
Chlamydia: NEGATIVE
Comment: NEGATIVE
Comment: NEGATIVE
Comment: NORMAL
Diagnosis: NEGATIVE
High risk HPV: NEGATIVE
Neisseria Gonorrhea: NEGATIVE

## 2024-08-06 ENCOUNTER — Institutional Professional Consult (permissible substitution): Admitting: Neurology

## 2024-08-09 ENCOUNTER — Encounter: Payer: Self-pay | Admitting: Family Medicine

## 2024-08-10 ENCOUNTER — Encounter: Payer: Self-pay | Admitting: Family Medicine

## 2024-08-13 ENCOUNTER — Other Ambulatory Visit: Payer: Self-pay

## 2024-08-13 ENCOUNTER — Emergency Department (HOSPITAL_COMMUNITY)
Admission: EM | Admit: 2024-08-13 | Discharge: 2024-08-14 | Disposition: A | Discharge status: Home / Self Care | Attending: Emergency Medicine | Admitting: Emergency Medicine

## 2024-08-13 DIAGNOSIS — K0889 Other specified disorders of teeth and supporting structures: Secondary | ICD-10-CM | POA: Insufficient documentation

## 2024-08-13 DIAGNOSIS — G4489 Other headache syndrome: Secondary | ICD-10-CM | POA: Diagnosis not present

## 2024-08-13 DIAGNOSIS — R519 Headache, unspecified: Secondary | ICD-10-CM | POA: Diagnosis present

## 2024-08-13 DIAGNOSIS — Z79899 Other long term (current) drug therapy: Secondary | ICD-10-CM | POA: Diagnosis not present

## 2024-08-13 LAB — COMPREHENSIVE METABOLIC PANEL WITH GFR
ALT: 13 U/L (ref 0–44)
AST: 17 U/L (ref 15–41)
Albumin: 4.4 g/dL (ref 3.5–5.0)
Alkaline Phosphatase: 96 U/L (ref 38–126)
Anion gap: 14 (ref 5–15)
BUN: 7 mg/dL (ref 6–20)
CO2: 23 mmol/L (ref 22–32)
Calcium: 9.3 mg/dL (ref 8.9–10.3)
Chloride: 103 mmol/L (ref 98–111)
Creatinine, Ser: 0.74 mg/dL (ref 0.44–1.00)
GFR, Estimated: >60 mL/min
Glucose, Bld: 104 mg/dL — ABNORMAL HIGH (ref 70–99)
Potassium: 3.7 mmol/L (ref 3.5–5.1)
Sodium: 140 mmol/L (ref 135–145)
Total Bilirubin: 0.3 mg/dL (ref 0.0–1.2)
Total Protein: 7.4 g/dL (ref 6.5–8.1)

## 2024-08-13 LAB — CBC
HCT: 40.1 % (ref 36.0–46.0)
Hemoglobin: 12.6 g/dL (ref 12.0–15.0)
MCH: 28.4 pg (ref 26.0–34.0)
MCHC: 31.4 g/dL (ref 30.0–36.0)
MCV: 90.5 fL (ref 80.0–100.0)
Platelets: 311 K/uL (ref 150–400)
RBC: 4.43 MIL/uL (ref 3.87–5.11)
RDW: 15 % (ref 11.5–15.5)
WBC: 10.5 K/uL (ref 4.0–10.5)
nRBC: 0 % (ref 0.0–0.2)

## 2024-08-13 LAB — URINALYSIS, ROUTINE W REFLEX MICROSCOPIC
Bilirubin Urine: NEGATIVE
Glucose, UA: NEGATIVE mg/dL
Ketones, ur: NEGATIVE mg/dL
Nitrite: NEGATIVE
Protein, ur: 30 mg/dL — AB
Specific Gravity, Urine: 1.027 (ref 1.005–1.030)
pH: 5 (ref 5.0–8.0)

## 2024-08-13 LAB — LIPASE, BLOOD: Lipase: 28 U/L (ref 11–51)

## 2024-08-13 MED ORDER — ONDANSETRON 4 MG PO TBDP
4.0000 mg | ORAL_TABLET | Freq: Once | ORAL | Status: AC | PRN
  Administered 2024-08-13: 4 mg via ORAL
  Filled 2024-08-13: qty 1

## 2024-08-13 NOTE — ED Triage Notes (Signed)
 Pt having intermittent dental pain, took 5mg  oxycodone , then had approximately 8 episodes of vomiting thereafter.  Endorses a headache as well.

## 2024-08-14 NOTE — ED Provider Notes (Signed)
 "  EMERGENCY DEPARTMENT AT [facility] Provider Note   CSN: [id number] Arrival date & time: 08/13/24  2206     Patient presents with: Emesis   Jaime Flores is a 39 y.o. female.   The history is provided by the patient and the spouse.  Patient with a history of PNES presents for headache.  Patient reports she was having dental pain and took oxycodone , and later on she started having generalized headache with multiple episodes of nonbloody emesis.  No fevers.  She reports distant history of migraines but none recently.  She reports photophobia.  No arm or leg weakness.  No visual or hearing changes.  No syncope. She is now feeling improved and requesting discharge home No recent seizures or falls  She has been walking without difficulty Past Medical History:  Diagnosis Date   Amenorrhea 01/27/2013   Angio-edema    Breast pain 03/10/2015   Encounter for menstrual regulation 07/05/2015   History of pseudoseizure    Irregular intermenstrual bleeding 07/28/2015   Irregular periods 07/05/2015   Screening for STD (sexually transmitted disease) 08/07/2013   Seizures (HCC)    PNES   Urticaria    UTI (urinary tract infection)    Vaginal discharge 06/10/2013     Prior to Admission medications  Medication Sig Start Date End Date Taking? Authorizing Provider  diphenhydrAMINE  (BENADRYL  ALLERGY ) 25 MG tablet Take 1 tablet (25 mg total) by mouth every 6 (six) hours as needed for allergies. 05/20/24   [name], Fonda LABOR, MD  fluticasone  (FLONASE ) 50 MCG/ACT nasal spray Place 2 sprays into both nostrils daily. 11/20/23   Severa Rock HERO, FNP  QUEtiapine  (SEROQUEL ) 25 MG tablet Take 1 tablet (25 mg total) by mouth at bedtime. 06/12/24   [name], Fonda LABOR, MD  sertraline  (ZOLOFT ) 100 MG tablet Take 1.5 tablets (150 mg total) by mouth daily. 06/12/24   [name], Fonda LABOR, MD  sodium chloride  (OCEAN) 0.65 % SOLN nasal spray Place 1 spray into both nostrils as needed for  congestion. 05/20/24   [name], Fonda LABOR, MD    Allergies: [place] gums allergy ], Doxycycline, Wellbutrin  Equitius.fake ], and Tape    Review of Systems  HENT:  Negative for hearing loss.   Eyes:  Negative for visual disturbance.  Gastrointestinal:  Positive for nausea and vomiting.  Neurological:  Positive for headaches. Negative for seizures, syncope, speech difficulty and weakness.    Updated Vital Signs BP (!) 105/50   Pulse 61   Temp 98.5 F (36.9 C) (Oral)   Resp 15   Ht 1.575 m (5' 2)   Wt 69.4 kg   SpO2 95%   BMI 27.98 kg/m   Physical Exam CONSTITUTIONAL: Well developed/well nourished HEAD: Normocephalic/atraumatic EYES: EOMI/PERRL, no nystagmus, no ptosis, normal fundoscopic exam (no papilledema)  ENMT: Mucous membranes moist, poor dentition, no obvious dental abscess NECK: supple no meningeal signs SPINE/BACK:entire spine nontender CV: S1/S2 noted, no murmurs/rubs/gallops noted LUNGS: Lungs are clear to auscultation bilaterally, no apparent distress ABDOMEN: soft, nontender NEURO:Awake/alert, face symmetric, no arm or leg drift is noted Equal 5/5 strength with shoulder abduction, elbow flex/extension, wrist flex/extension in upper extremities and equal hand grips bilaterally Equal 5/5 strength with hip flexion,knee flex/extension, foot dorsi/plantar flexion Cranial nerves 3/4/5/6/01/28/09/11/12 tested and intact No past pointing Sensation to light touch intact in all extremities EXTREMITIES: pulses normal, full ROM SKIN: warm, color normal PSYCH: no abnormalities of mood noted, alert and oriented to situation  (all labs ordered are listed, but only  abnormal results are displayed) Labs Reviewed  COMPREHENSIVE METABOLIC PANEL WITH GFR - Abnormal; Notable for the following components:      Result Value   Glucose, Bld 104 (*)    All other components within normal limits  URINALYSIS, ROUTINE W REFLEX MICROSCOPIC - Abnormal; Notable for the following components:    APPearance HAZY (*)    Hgb urine dipstick SMALL (*)    Protein, ur 30 (*)    Leukocytes,Ua LARGE (*)    Bacteria, UA RARE (*)    All other components within normal limits  LIPASE, BLOOD  CBC    EKG: None  Radiology: No results found.   Procedures   Medications Ordered in the ED  ondansetron  (ZOFRAN -ODT) disintegrating tablet 4 mg (4 mg Oral Given 08/13/24 2236)                                    Medical Decision Making Amount and/or Complexity of Data Reviewed Labs: ordered.  Risk Prescription drug management.   This patient presents to the ED for concern of headache, this involves an extensive number of treatment options, and is a complaint that carries with it a high risk of complications and morbidity.  The differential diagnosis includes but is not limited to subarachnoid hemorrhage, intracranial hemorrhage, meningitis, encephalitis, CVST, idiopathic intracranial hypertension, migraine   Comorbidities that complicate the patient evaluation: Patients presentation is complicated by their history of PNES  Additional history obtained: Additional history obtained from spouse Records reviewed she has had previous negative MRI  Lab Tests: I Ordered, and personally interpreted labs.  The pertinent results include: Labs overall unremarkable   Medicines ordered and prescription drug management: I ordered medication including Zofran  for nausea Reevaluation of the patient after these medicines showed that the patient    improved  Test Considered: Low suspicion for acute neurologic emergency, will defer imaging  Reevaluation: After the interventions noted above, I reevaluated the patient and found that they have :improved  Complexity of problems addressed: Patients presentation is most consistent with  acute presentation with potential threat to life or bodily function  Disposition: After consideration of the diagnostic results and the patients response to  treatment,  I feel that the patent would benefit from discharge  .        Final diagnoses:  Other headache syndrome    ED Discharge Orders     None          Midge Golas, MD 08/14/24 0225  "

## 2024-08-14 NOTE — Discharge Instructions (Signed)

## 2024-09-14 ENCOUNTER — Encounter: Admitting: Family Medicine

## 2024-09-24 ENCOUNTER — Ambulatory Visit: Admitting: Adult Health
